# Patient Record
Sex: Female | Born: 1965 | Race: White | Hispanic: No | Marital: Single | Smoking: Current every day smoker
Health system: Southern US, Community
[De-identification: ages and names within clinical notes are randomized; demographics above are authoritative.]

## PROBLEM LIST (undated history)

## (undated) DIAGNOSIS — T7840XA Allergy, unspecified, initial encounter: Secondary | ICD-10-CM

## (undated) DIAGNOSIS — M810 Age-related osteoporosis without current pathological fracture: Secondary | ICD-10-CM

## (undated) DIAGNOSIS — D689 Coagulation defect, unspecified: Secondary | ICD-10-CM

## (undated) DIAGNOSIS — F419 Anxiety disorder, unspecified: Secondary | ICD-10-CM

## (undated) DIAGNOSIS — K219 Gastro-esophageal reflux disease without esophagitis: Secondary | ICD-10-CM

## (undated) DIAGNOSIS — F32A Depression, unspecified: Secondary | ICD-10-CM

## (undated) DIAGNOSIS — R519 Headache, unspecified: Secondary | ICD-10-CM

## (undated) DIAGNOSIS — M1990 Unspecified osteoarthritis, unspecified site: Secondary | ICD-10-CM

## (undated) DIAGNOSIS — E785 Hyperlipidemia, unspecified: Secondary | ICD-10-CM

## (undated) HISTORY — PX: TONSILLECTOMY: SUR1361

## (undated) HISTORY — DX: Age-related osteoporosis without current pathological fracture: M81.0

## (undated) HISTORY — PX: COLONOSCOPY: SHX174

## (undated) HISTORY — DX: Hyperlipidemia, unspecified: E78.5

## (undated) HISTORY — PX: ABDOMINAL HYSTERECTOMY: SHX81

## (undated) HISTORY — PX: OTHER SURGICAL HISTORY: SHX169

## (undated) HISTORY — DX: Allergy, unspecified, initial encounter: T78.40XA

## (undated) HISTORY — PX: FRACTURE SURGERY: SHX138

## (undated) HISTORY — PX: APPENDECTOMY: SHX54

## (undated) HISTORY — DX: Coagulation defect, unspecified: D68.9

## (undated) HISTORY — PX: BOWEL RESECTION: SHX1257

---

## 2003-07-22 HISTORY — PX: BOWEL RESECTION: SHX1257

## 2003-07-22 HISTORY — PX: APPENDECTOMY: SHX54

## 2003-07-22 HISTORY — PX: OTHER SURGICAL HISTORY: SHX169

## 2003-11-26 ENCOUNTER — Emergency Department (HOSPITAL_COMMUNITY): Admission: EM | Admit: 2003-11-26 | Discharge: 2003-11-26 | Payer: Self-pay | Admitting: Emergency Medicine

## 2003-11-26 IMAGING — CT CT PELVIS W/ CM
1 of 3 series · 13 of 32 positions shown, 19 images · IV contrast (APPLIED)
Comparison: none

CLINICAL DATA: Right-sided abdominal pain.  History of endometriosis.
TECHNIQUE: Multidetector helical CT of the abdomen and pelvis is performed during the administration of 150 cc of Omnipaque 300 intravenous contrast.  Oral contrast has also been administered.  There are no prior studies for comparison.
 ABDOMEN CT WITH CONTRAST 
 The liver, gallbladder, spleen, pancreas, adrenal glands, and kidneys are normal in appearance.  There is no evidence of dilated bowel loops.  There is no evidence of abnormal soft tissue masses or adenopathy.  There is no evidence of inflammatory process or ascites. 
 IMPRESSION
 Negative abdomen CT. 
 PELVIS CT WITH CONTRAST 
 A mixed attenuation mass is seen within the right pelvis involving right lower quadrant small bowel loops.  This mass measures approximately 6.0 x 5.2 cm.  This mass involves the serosal surface of several of these small bowel loops, and this would be consistent with an endometrioma involving small bowel.  Neoplasm could have a similar appearance and cannot be excluded.  There is no evidence of bowel obstruction.  There is no evidence of other pelvic masses or free fluid.  
 1.  6 cm mass in the right pelvis involving small bowel loops.  This appears to be involving the serosal surface of several of these small bowel loops and would be consistent with an endometrioma given the patient?s history of endometriosis.  Neoplasm could have a similar appearance and cannot definitely be excluded. 
 2.  No evidence of small bowel obstruction or free fluid.

[Series 2: abd/pelvis 5.0 b30f · axial · 0.65mm/px · z∈[-426,-81]mm · 13 of 81 slices shown, 19 images]
[im 6/81  soft-tissue]
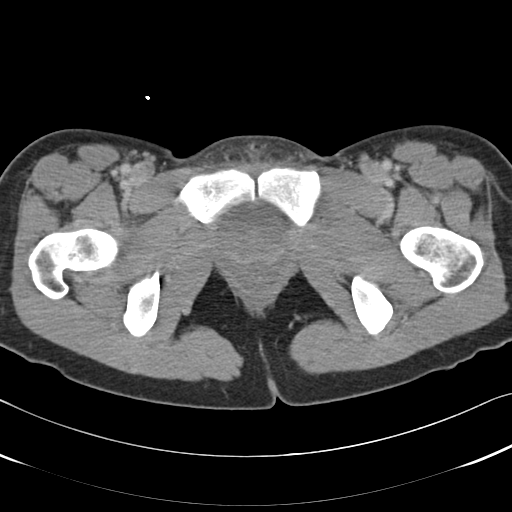
[im 6/81  bone]
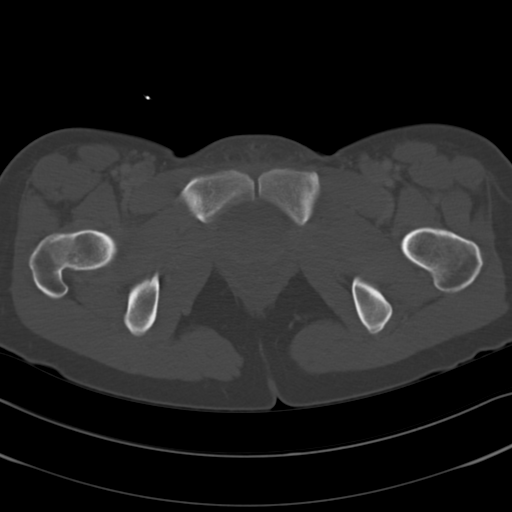
[im 11/81  soft-tissue]
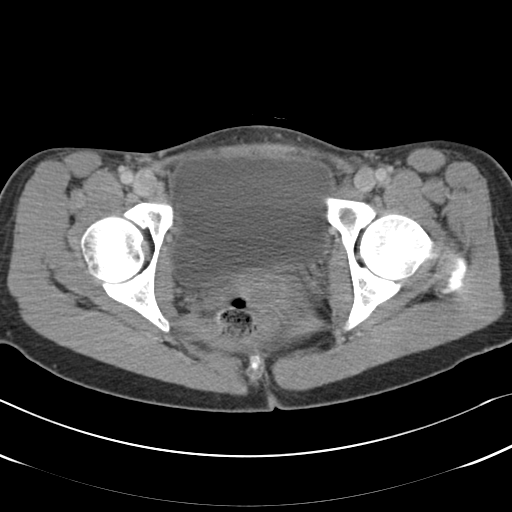
[im 17/81  soft-tissue]
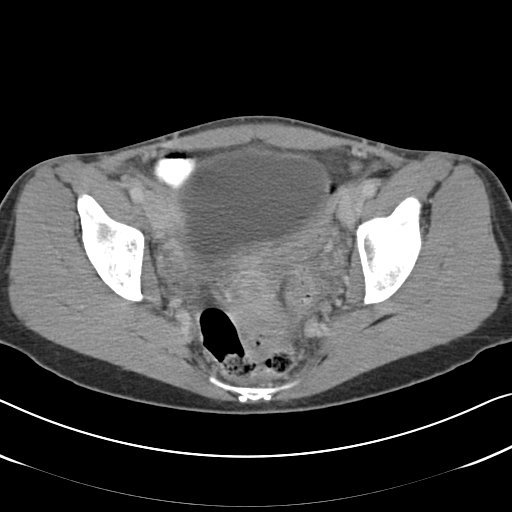
[im 22/81  soft-tissue]
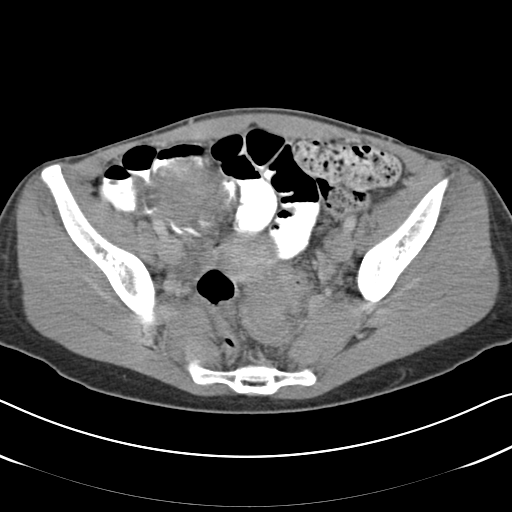
[im 27/81  soft-tissue]
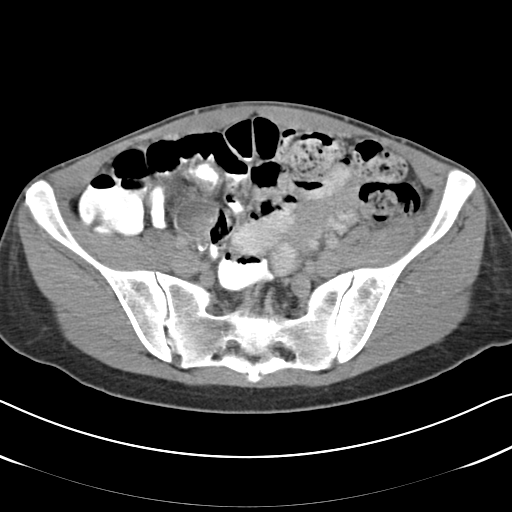
[im 33/81  soft-tissue]
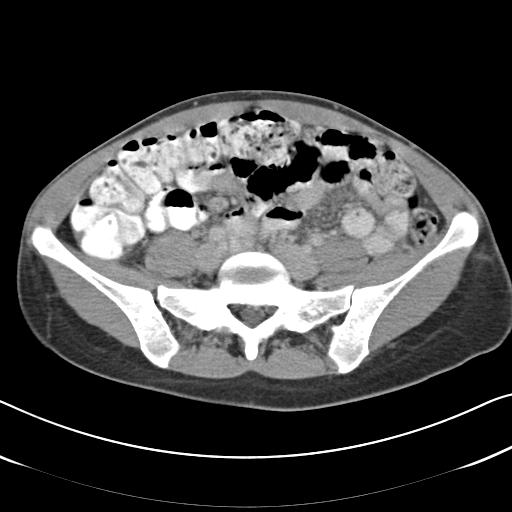
[im 43/81  soft-tissue]
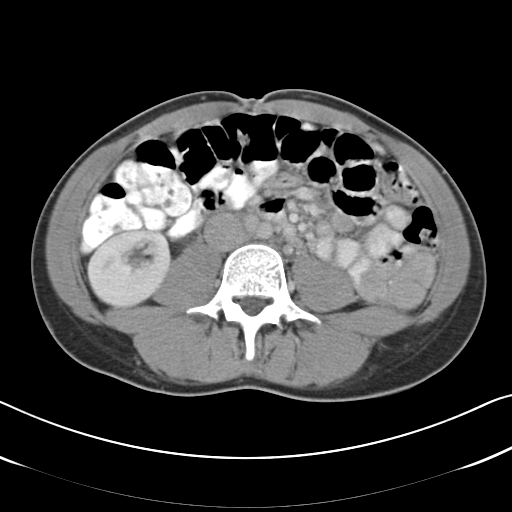
[im 49/81  soft-tissue]
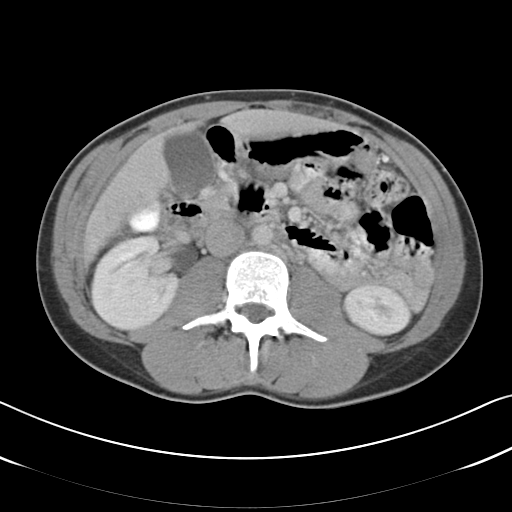
[im 54/81  soft-tissue]
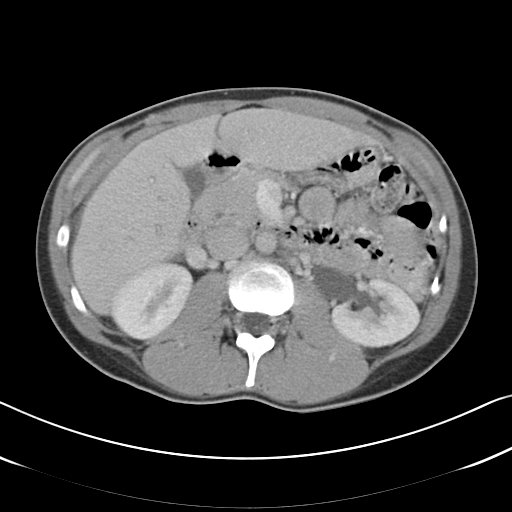
[im 54/81  bone]
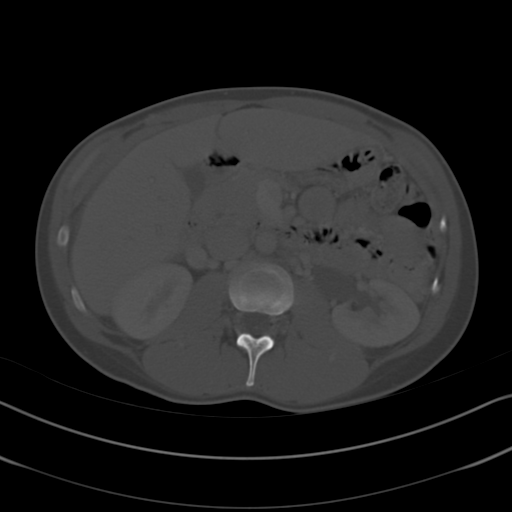
[im 59/81  soft-tissue]
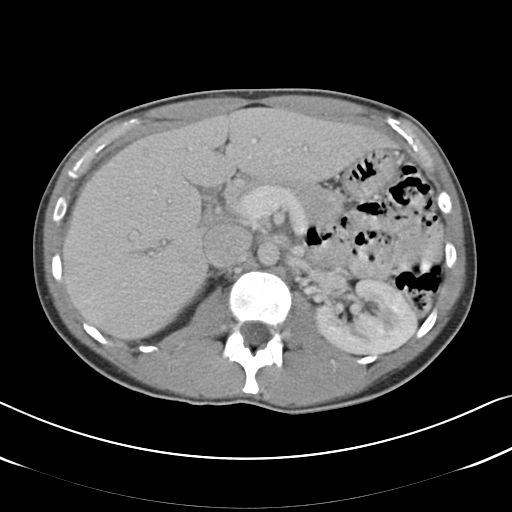
[im 59/81  lung]
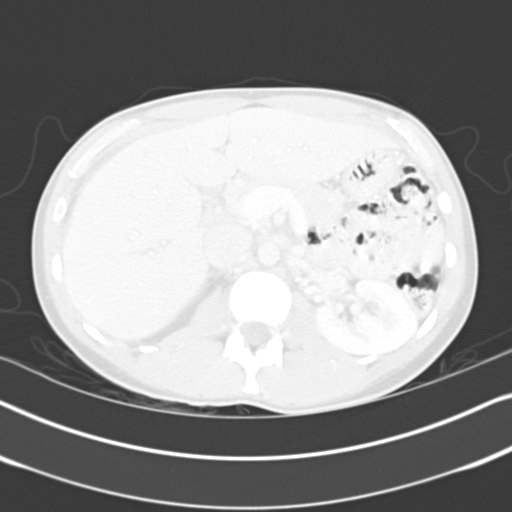
[im 65/81  soft-tissue]
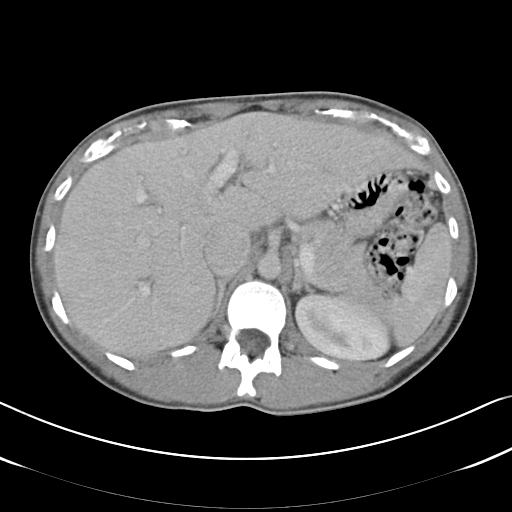
[im 65/81  lung]
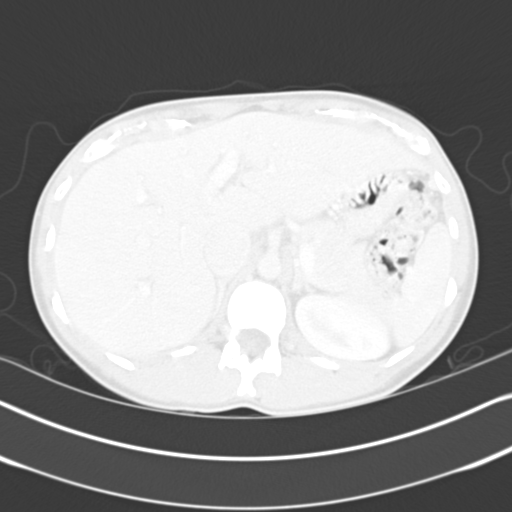
[im 70/81  soft-tissue]
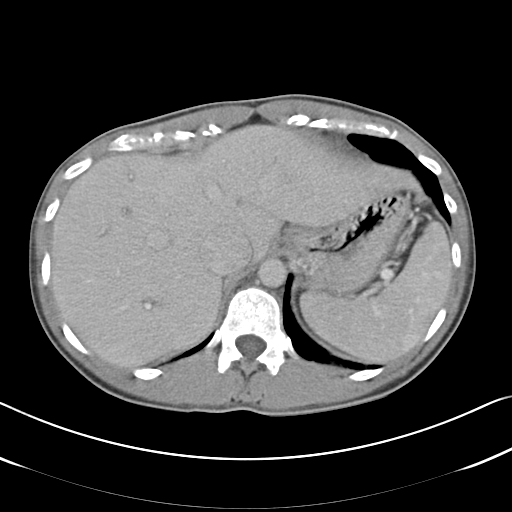
[im 70/81  lung]
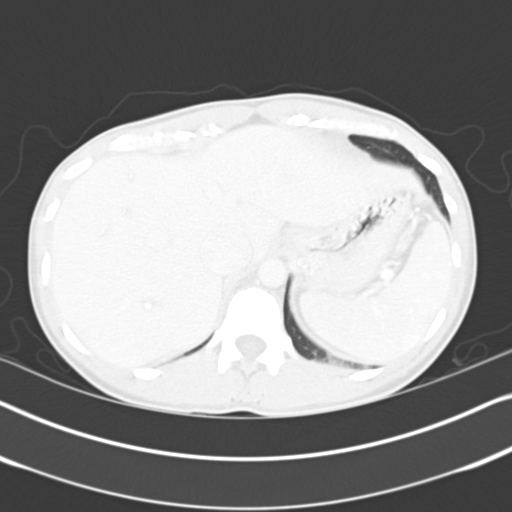
[im 75/81  soft-tissue]
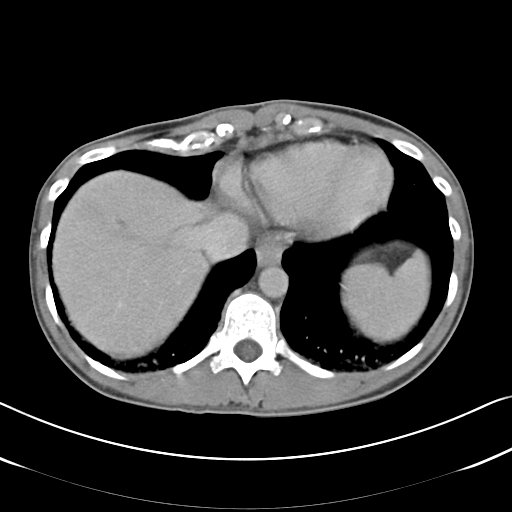
[im 75/81  lung]
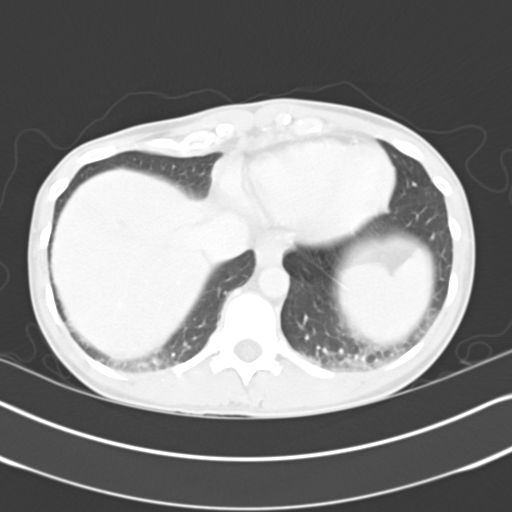

[13 of 32 positions shown; findings below may reference images not displayed]

## 2003-11-26 IMAGING — US US PELVIS COMPLETE MODIFY
1 series · 13 of 25 positions shown · non-contrast
Comparison: none

CLINICAL DATA: Right-sided abdominal and pelvic pain.  History of endometriosis.  Evaluate for pelvic mass or ovarian torsion. 
 TRANSABDOMINAL AND TRANSVAGINAL PELVIC ULTRASOUND 
 Both transabdominal and transvaginal sonography of the pelvis was performed. 
 Transabdominal sonography shows a heterogeneous soft tissue mass in the right pelvis superior to the level of the uterus which measures approximately 5.5 x 4 x 4 cm in greatest dimensions.  This is located relatively high within the right pelvis and could not be visualized by transvaginal sonography.  The uterus is normal in size measuring approximately 8.2 cm in length on transabdominal sonography.  No other pelvic masses are seen.  
 Transvaginal sonography shows a normal appearance of the uterus.  There is no evidence of fibroids.  Normal appearance of the endometrium is seen which measures approximately 3 mm.  
 Both ovaries are visualized and are normal in appearance.  On transvaginal sonography, both ovaries are seen distinct from the soft tissue mass in the high right pelvis.  Both ovaries contain multiple tiny follicles.  There is no evidence of free fluid.  
 IMPRESSION
 1.  5.5 cm soft tissue mass in the right pelvis superior to the uterus, which is separate from the ovaries.  
 2.  Normal appearance of both ovaries.  No evidence of free fluid. 
 3.  Normal size and appearance of the uterus. 
 PELVIS DOPPLER ULTRASOUND 
 Color Doppler and duplex Doppler sonography was performed in the pelvis to evaluate for ovarian torsion.  
 Color Doppler ultrasound shows the presence of blood flow within both ovaries on transvaginal sonography.  Duplex Doppler shows the presence of both arterial and venous waveforms within both the right and left ovaries. 
 Arterial and venous Doppler waveform patterns were seen in both ovaries.  No evidence of ovarian torsion.

[Series 1: pelvis · 0.30mm/px · 13 of 46 slices shown]
[im 1/46]
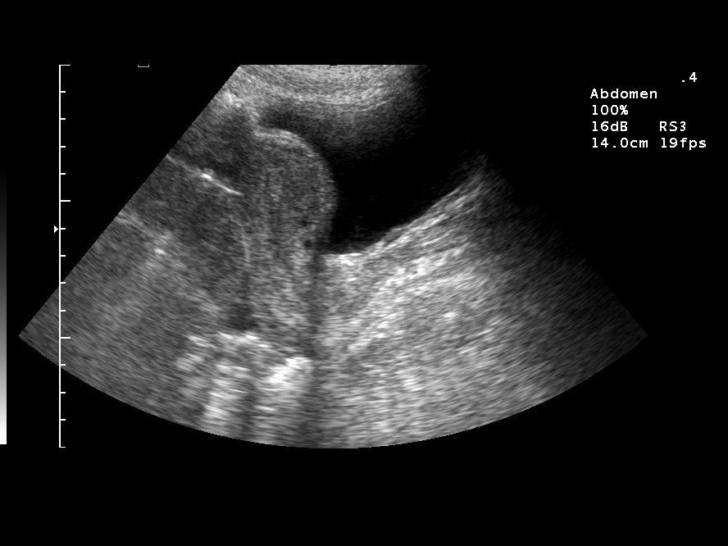
[im 4/46]
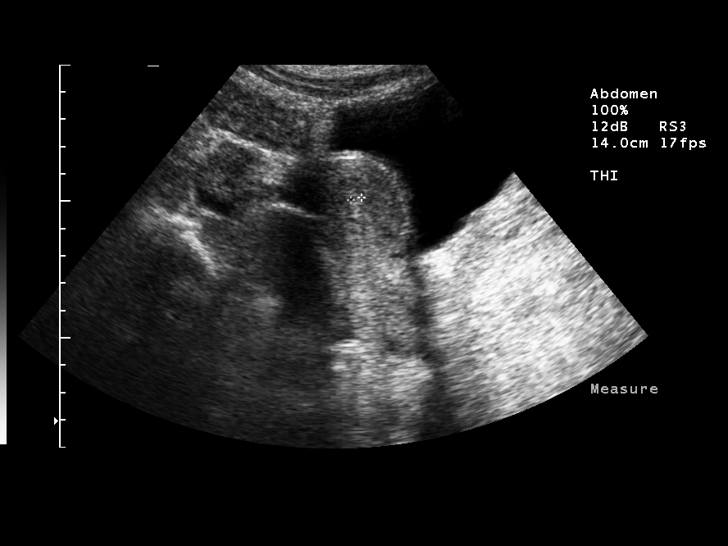
[im 8/46]
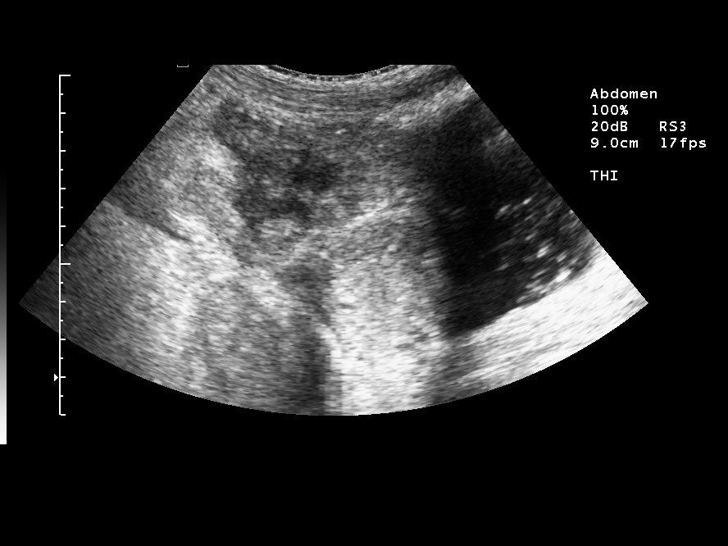
[im 12/46]
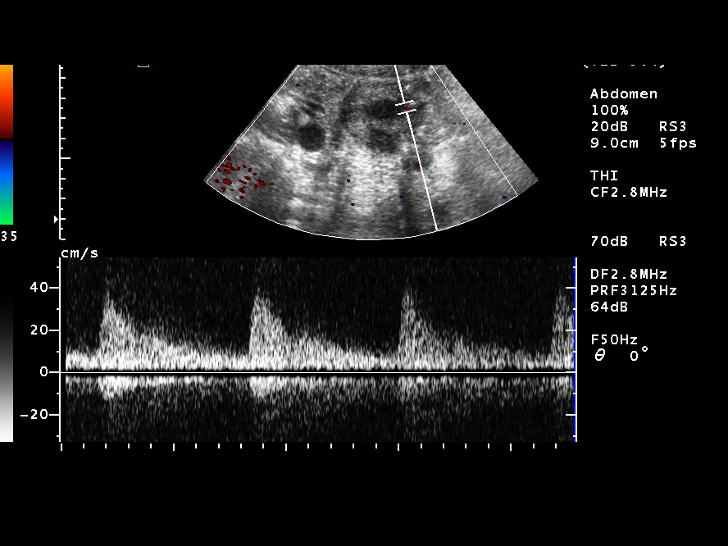
[im 16/46]
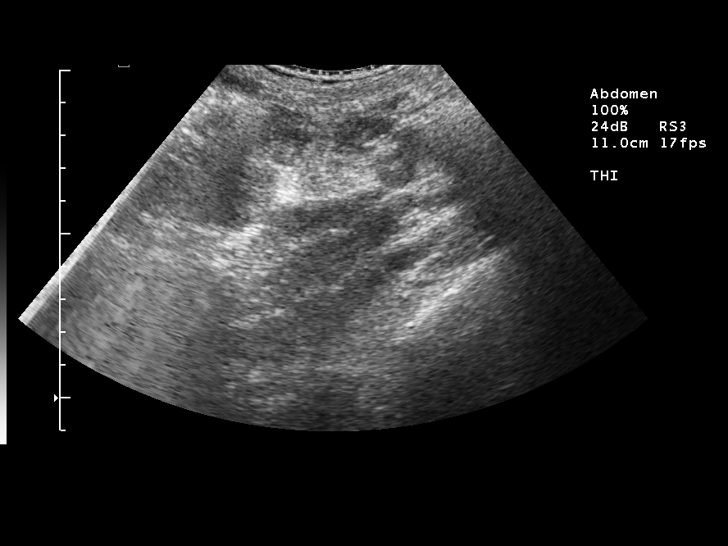
[im 19/46]
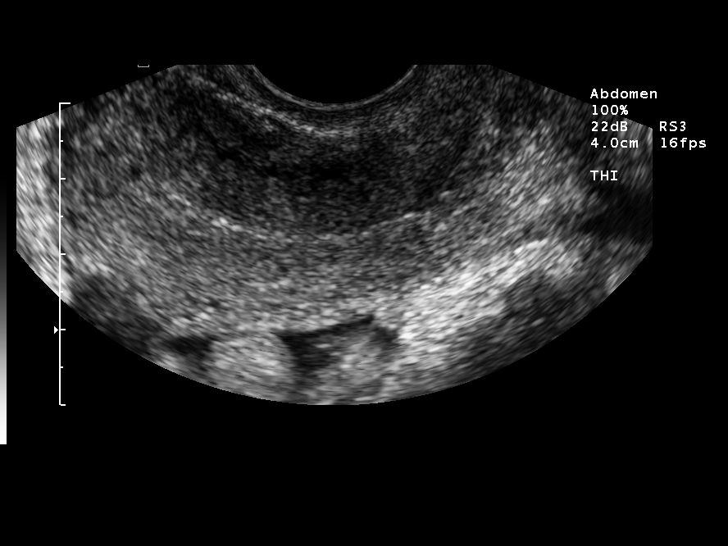
[im 23/46]
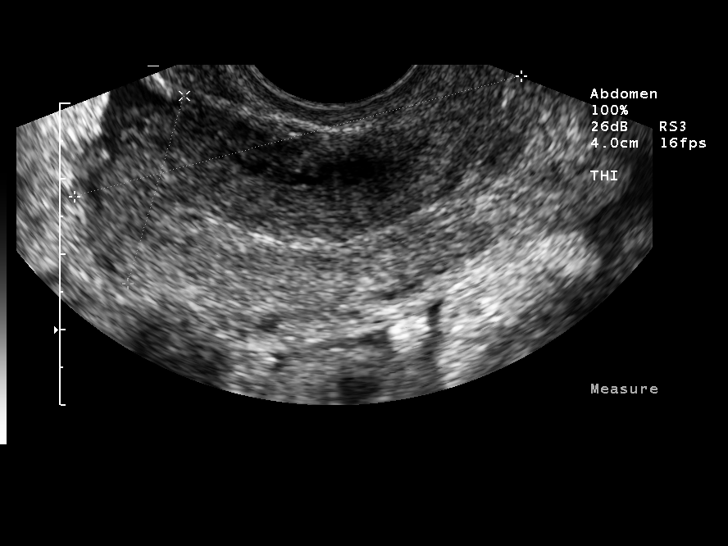
[im 27/46]
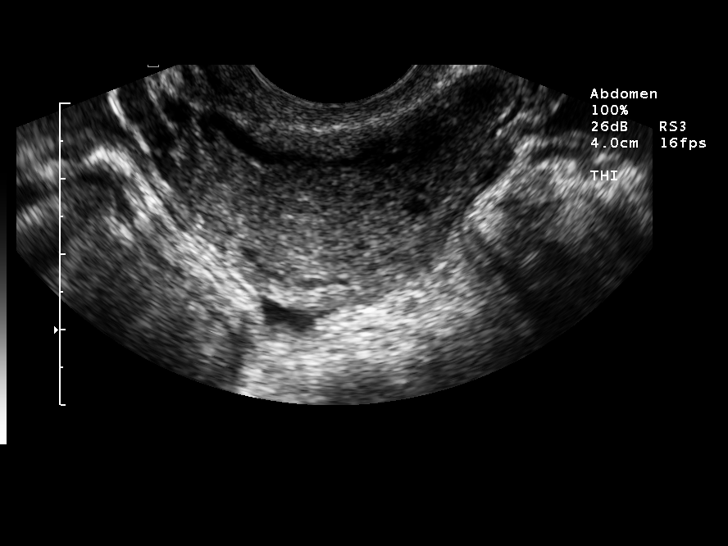
[im 31/46]
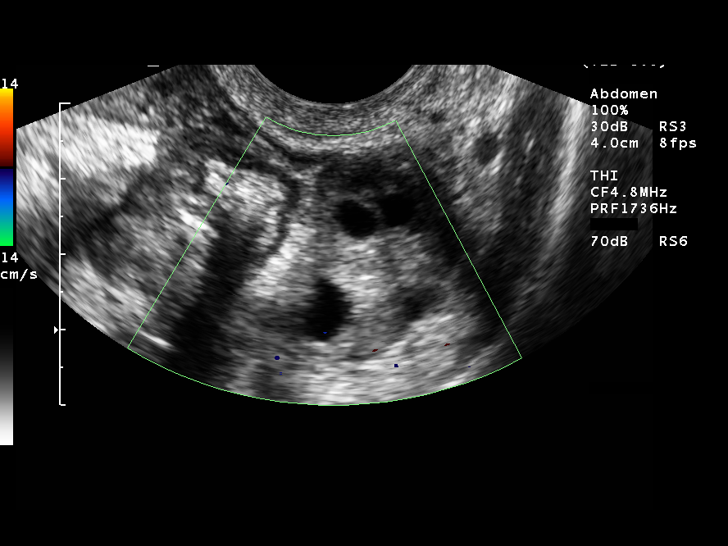
[im 34/46]
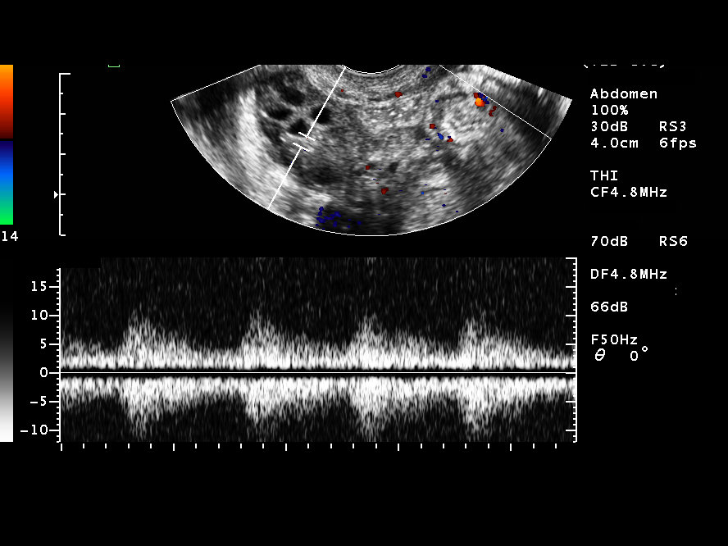
[im 38/46]
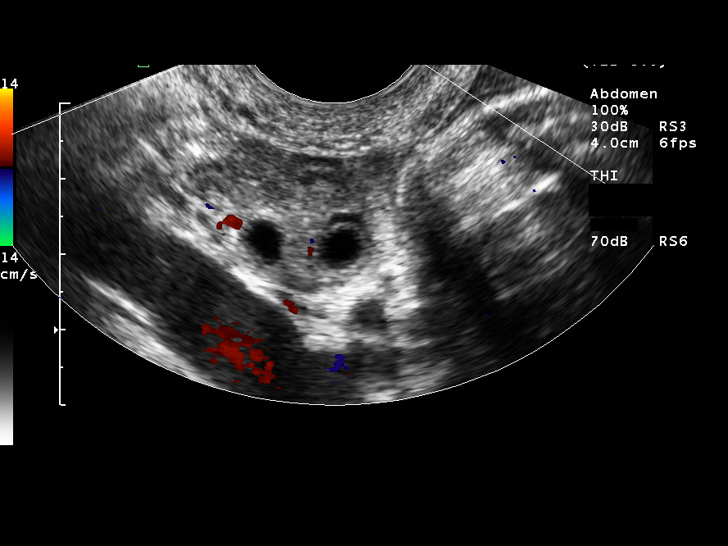
[im 42/46]
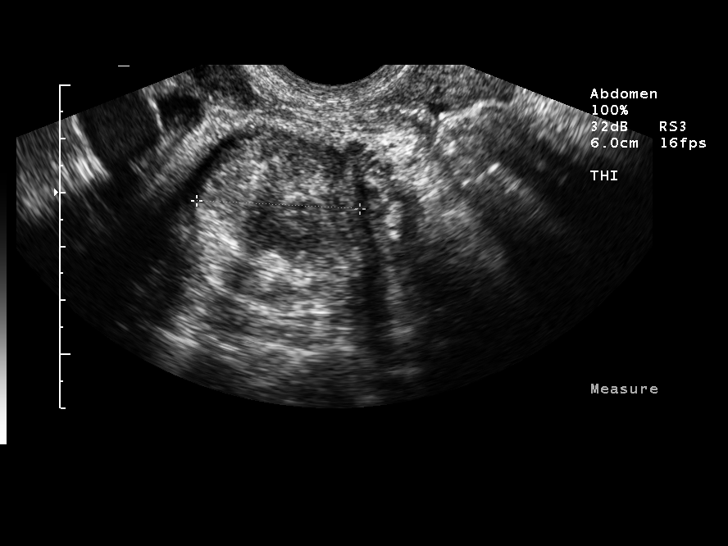
[im 46/46]
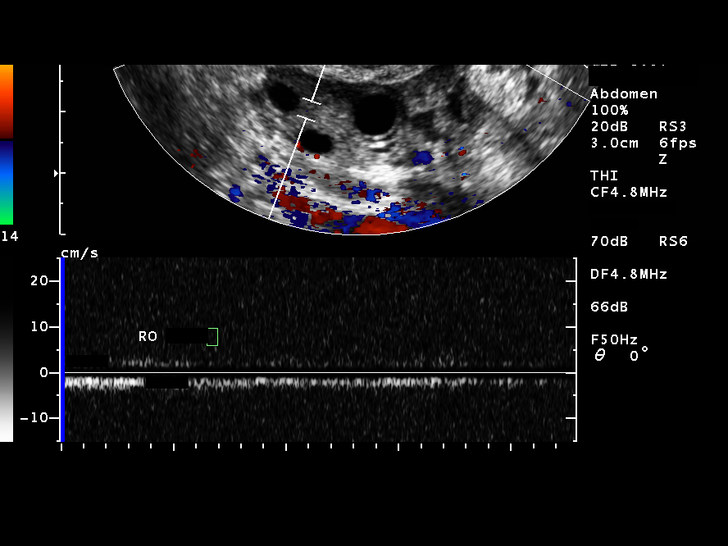

[13 of 25 positions shown; findings below may reference images not displayed]

## 2004-04-08 ENCOUNTER — Encounter (INDEPENDENT_AMBULATORY_CARE_PROVIDER_SITE_OTHER): Payer: Self-pay | Admitting: *Deleted

## 2004-04-09 ENCOUNTER — Inpatient Hospital Stay (HOSPITAL_COMMUNITY): Admission: EM | Admit: 2004-04-09 | Discharge: 2004-04-15 | Payer: Self-pay | Admitting: Emergency Medicine

## 2004-04-09 IMAGING — CR DG ABDOMEN ACUTE W/ 1V CHEST
4 series · 4 of 4 positions shown · non-contrast
Comparison: none

CLINICAL DATA: Abdominal pain and nausea.  
 ABDOMEN SERIES WITH SINGLE VIEW CHEST
 Supine and left decubitus abdominal radiographs show scattered small bowel and colonic gas.  There is no evidence of dilated bowel loops or air fluid levels.  There is no evidence of free intraperitoneal air.  A small amount of contrast material or other radiopaque substance is noted within the stomach.  No radiopaque calculi are seen.  Several pelvic phleboliths are noted.
 The heart size and mediastinal contours are normal.  Both lungs are clear.  Several bilateral small calcified granulomata are noted in both lungs.
 IMPRESSION
 1.  Unremarkable bowel gas pattern.  
 2.  No active cardiopulmonary disease.

[view not recorded (1 of 4)]
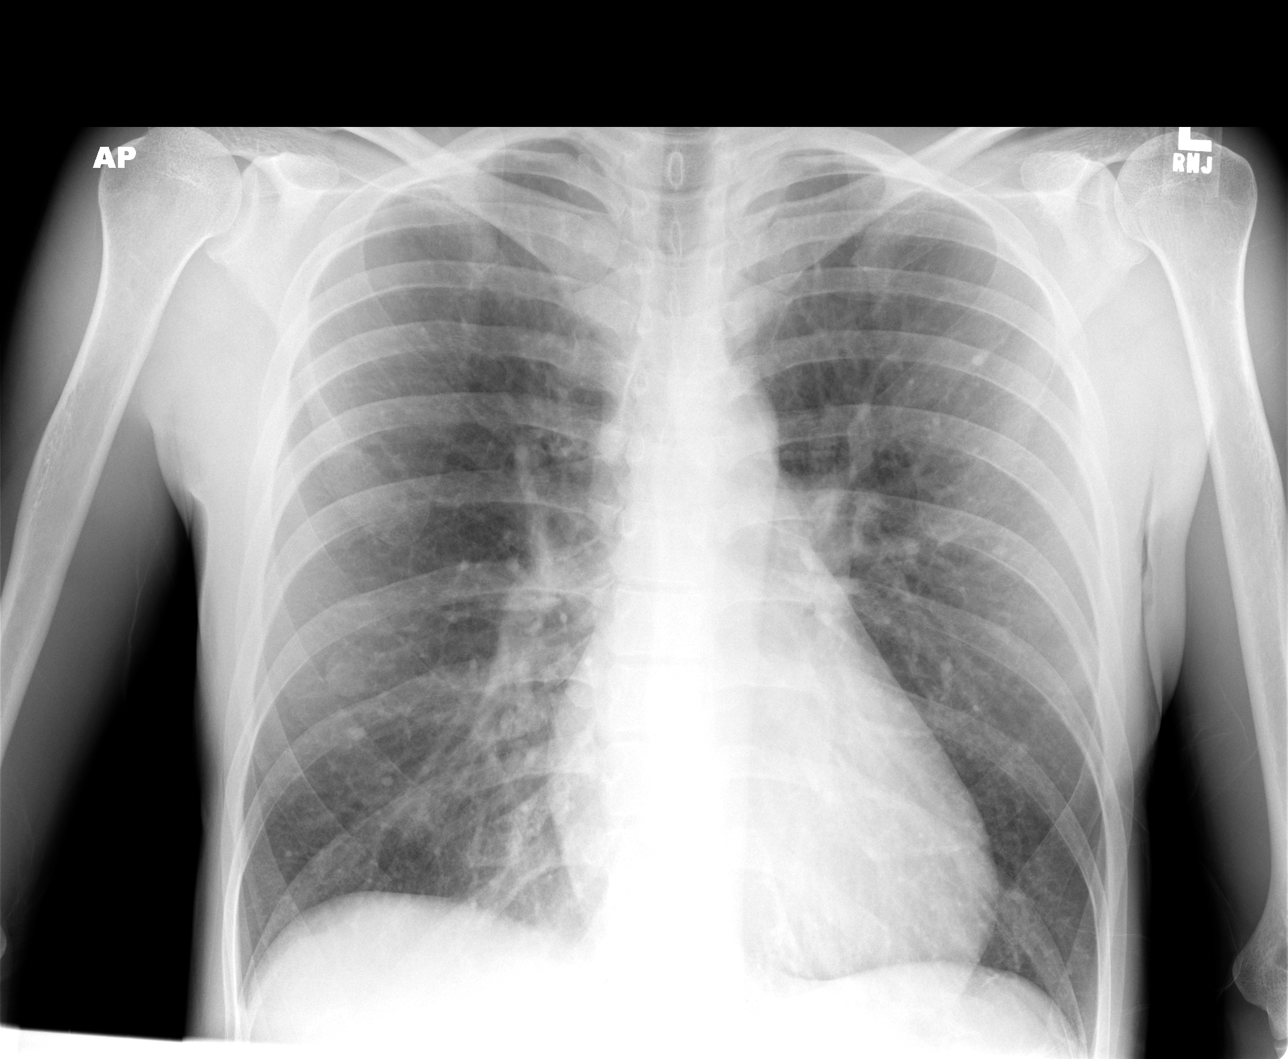

[view not recorded (2 of 4)]
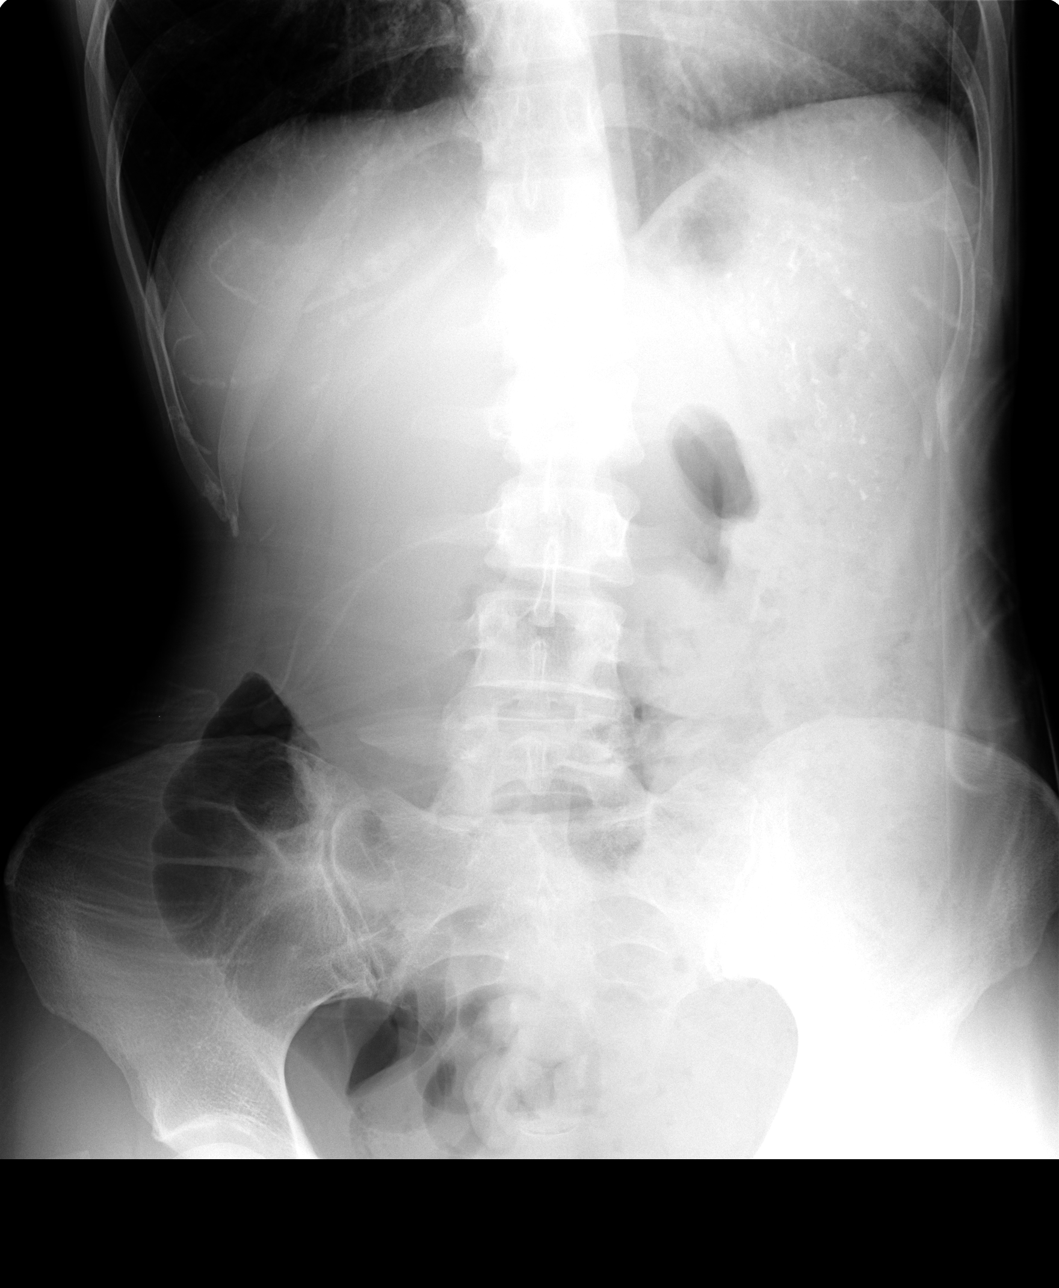

[view not recorded (3 of 4)]
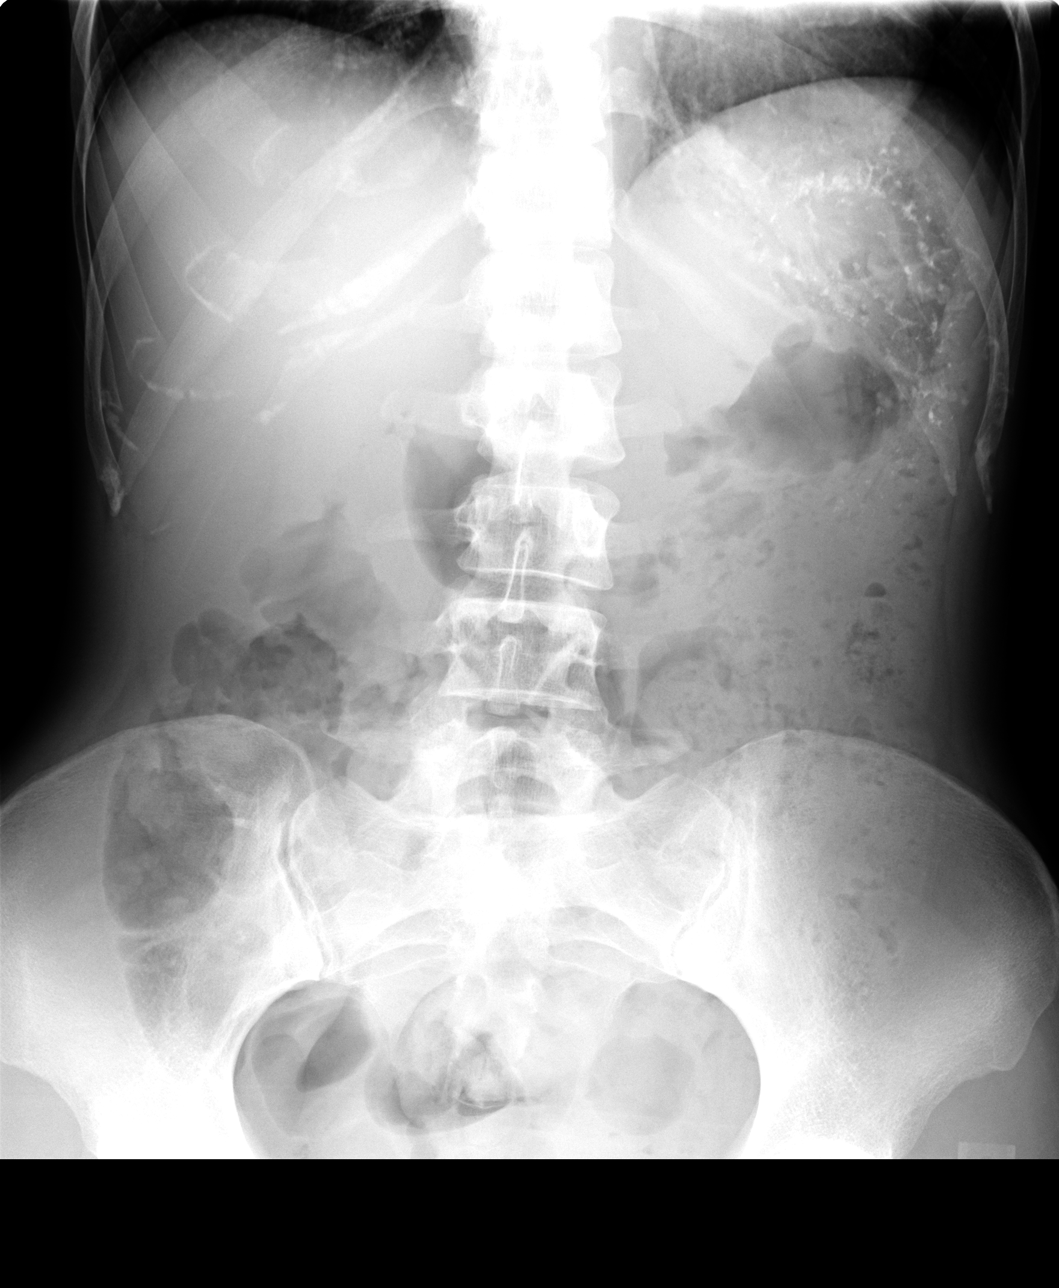

[view not recorded (4 of 4)]
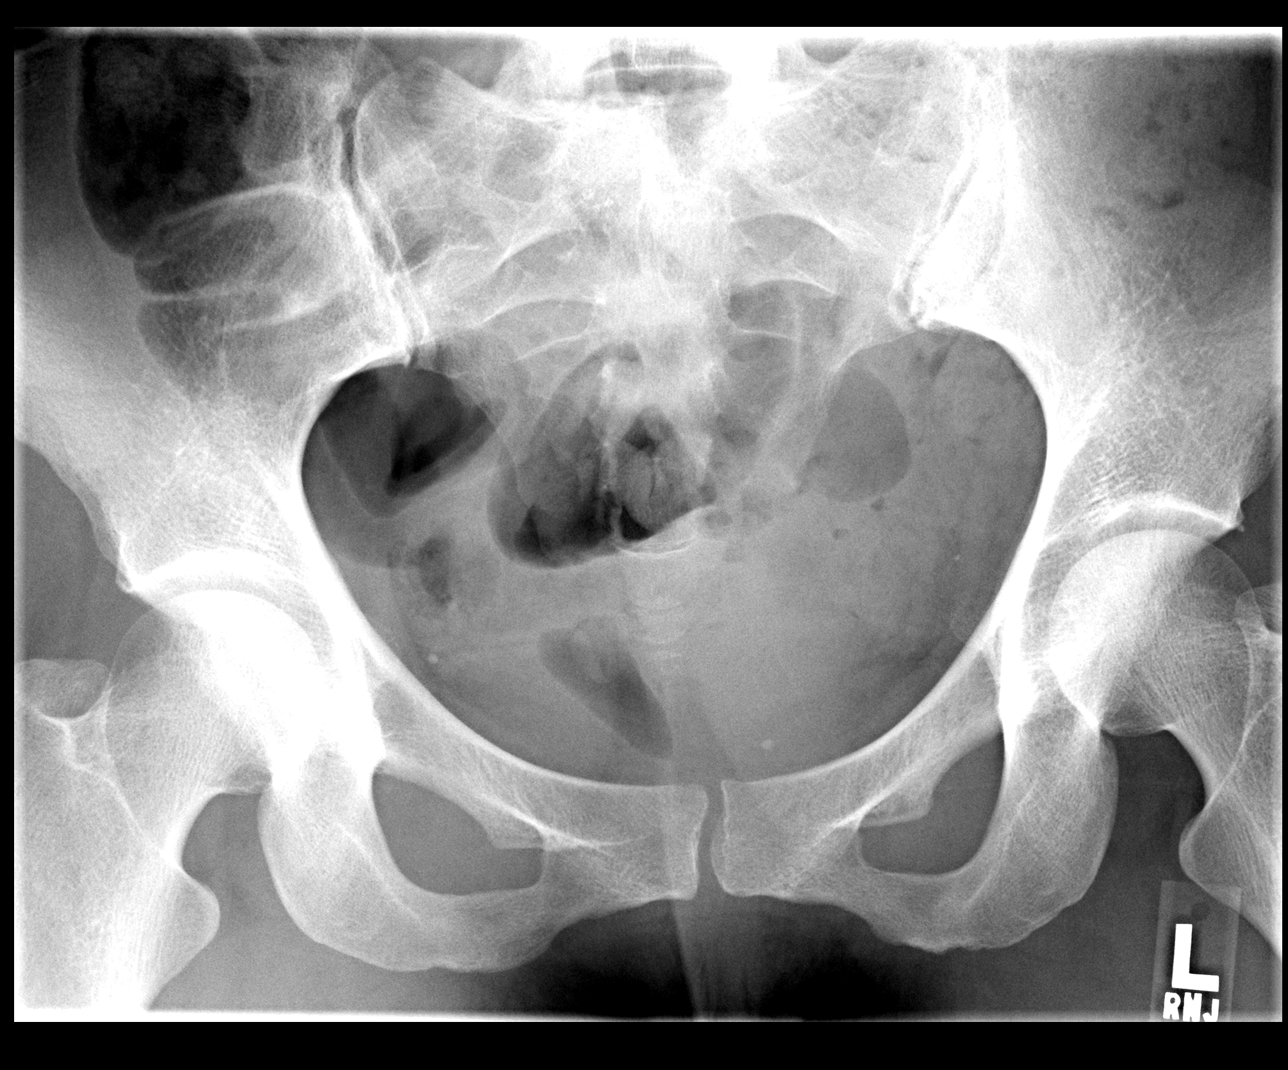

[4 of 4 positions shown; findings below may reference images not displayed]

## 2004-04-09 IMAGING — CT CT ABDOMEN W/ CM
2 of 5 series · 13 of 35 positions shown, 19 images · IV contrast (omnipaque)
Comparison: none

CLINICAL DATA: Acute onset generalized abdominal pain.  Elevated white count.  Endometriosis.  Previous surgery for bowel resection.
TECHNIQUE: Multidetector CT imaging of the abdomen and pelvis was performed during administration of 100 cc Omnipaque 300 intravenous contrast. Oral contrast was also administered.  Comparison is made to a prior study on [DATE]. 
ABDOMEN CT WITH CONTRAST
A small amount of ascites is seen in the right abdomen.  There is no evidence of abdominal mass or inflammatory process.  No dilated bowel loops are seen within the abdomen.  The abdominal parenchymal organs are unremarkable in appearance.  
IMPRESSION
Small amount of free fluid noted in the right abdomen. See pelvis CT report below. 
PELVIS CT WITH CONTRAST
Dilated fluid-filled small bowel loops are seen in the right pelvis which demonstrate wall thickening.  There is a central swirled appearance of the small bowel mesentery in this region in the area of surgical staples, and this is suspicious for a closed loop obstruction. 
A simple cyst is seen in the left adnexa measuring 2.8 x 2.2 cm which is consistent with a small left ovarian cyst.  A small soft tissue mass is seen projecting from the right frontal region of the uterus which measures 1.5 cm and is consistent with a small fibroid. 
Findings suspicious of closed loop small bowel obstruction in the right pelvis in the area of surgical staples.  Small amount of free fluid also noted.  
Small right fundal fibroid measuring approximately 1.5 cm.  2.8 cm left ovarian cyst also noted.

[Series 2: — · coronal · 1.00mm/px · 1 of 3 slices shown, 2 images (1 of 2)]
[im 2/3  soft-tissue]
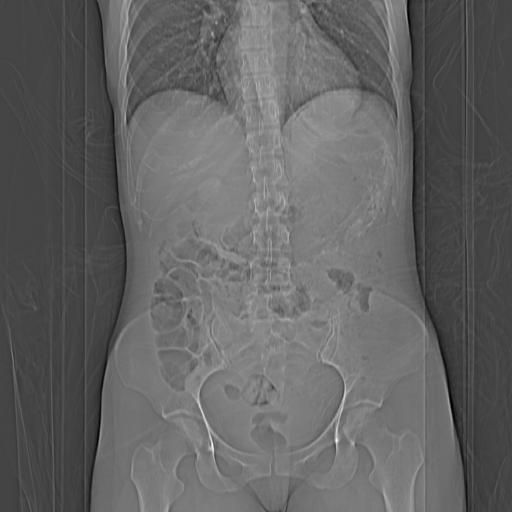
[im 2/3  bone]
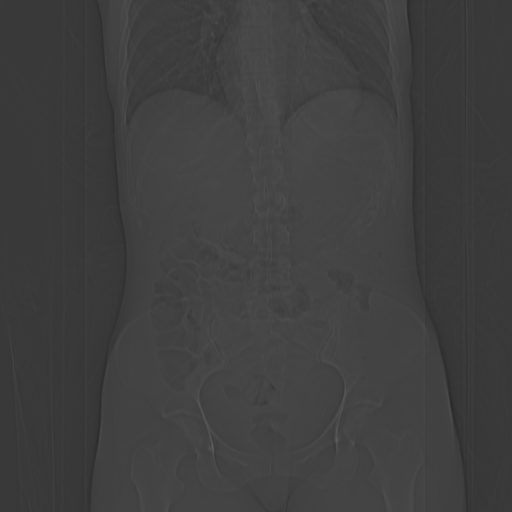

[Series 3: — · axial · 0.65mm/px · z∈[+734,+1109]mm · 12 of 85 slices shown, 17 images (2 of 2)]
[im 5/85  soft-tissue]
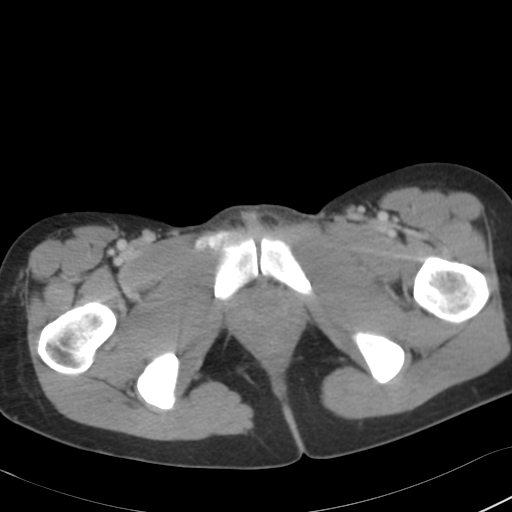
[im 5/85  bone]
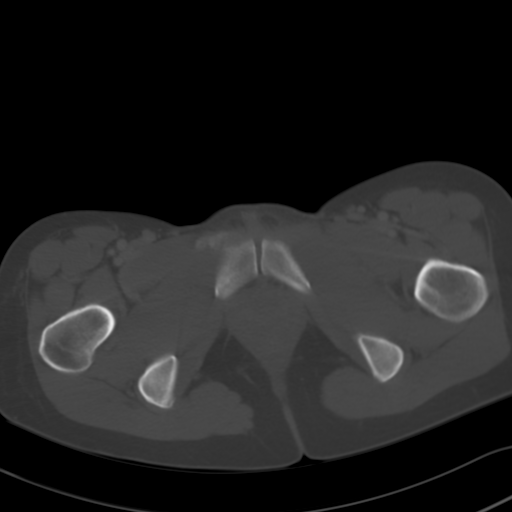
[im 15/85  soft-tissue]
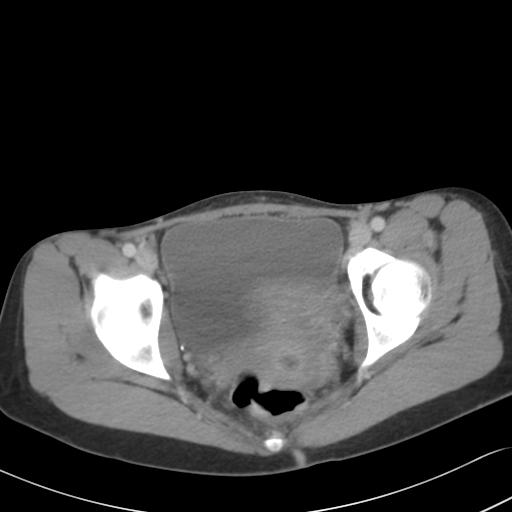
[im 19/85  soft-tissue]
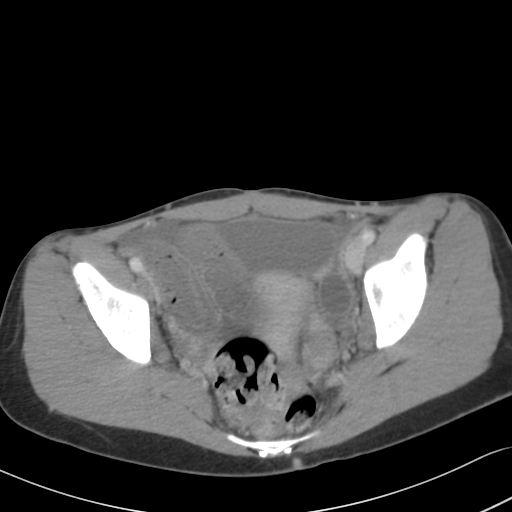
[im 29/85  soft-tissue]
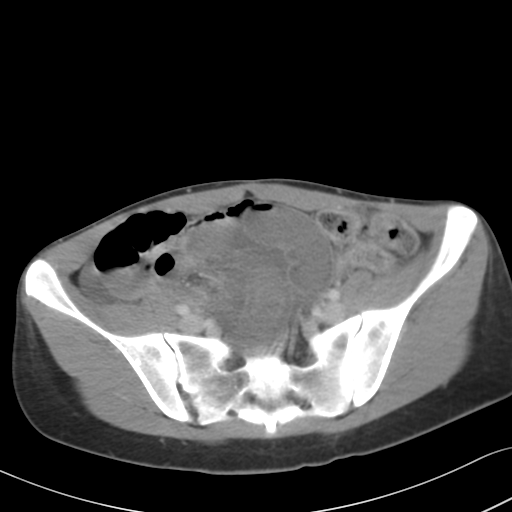
[im 33/85  soft-tissue]
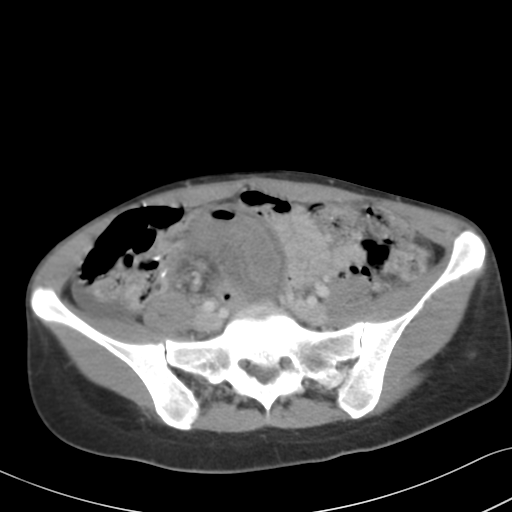
[im 43/85  soft-tissue]
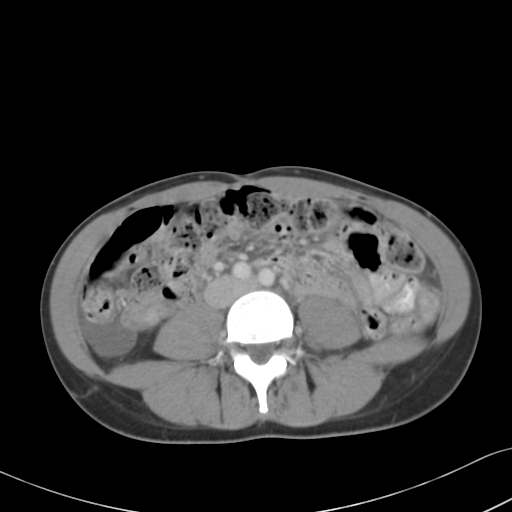
[im 52/85  soft-tissue]
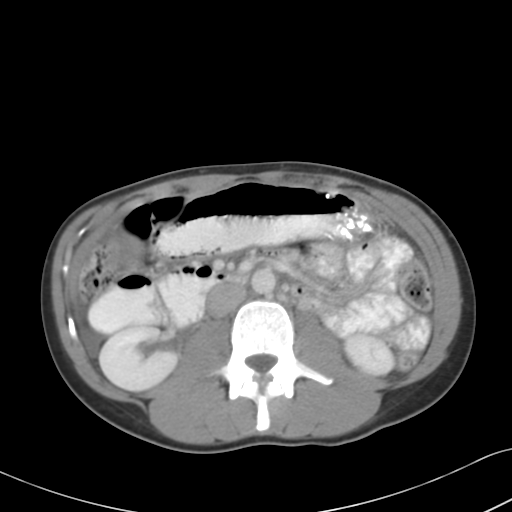
[im 57/85  soft-tissue]
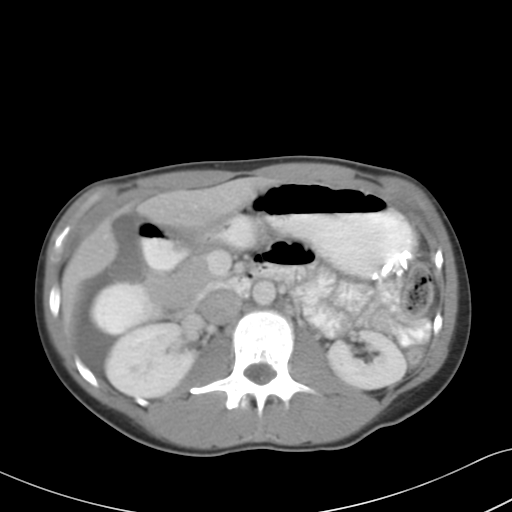
[im 66/85  soft-tissue]
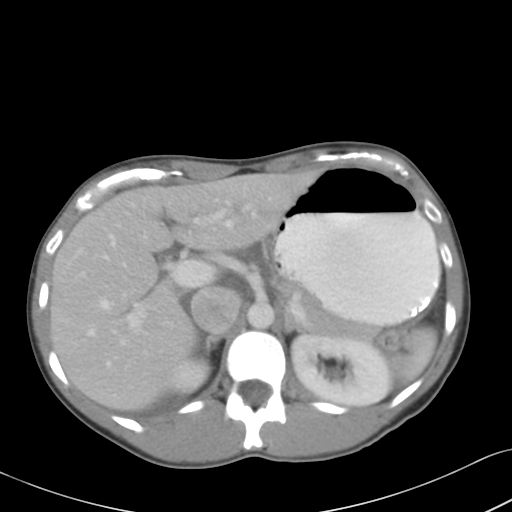
[im 66/85  lung]
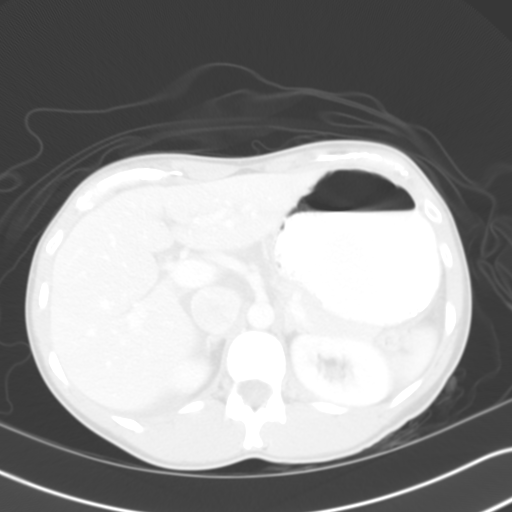
[im 66/85  bone]
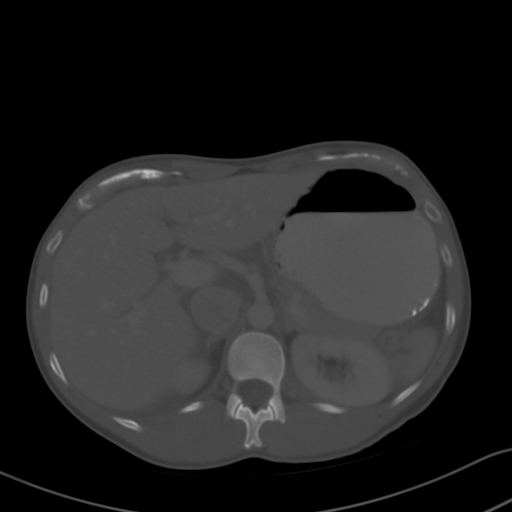
[im 71/85  soft-tissue]
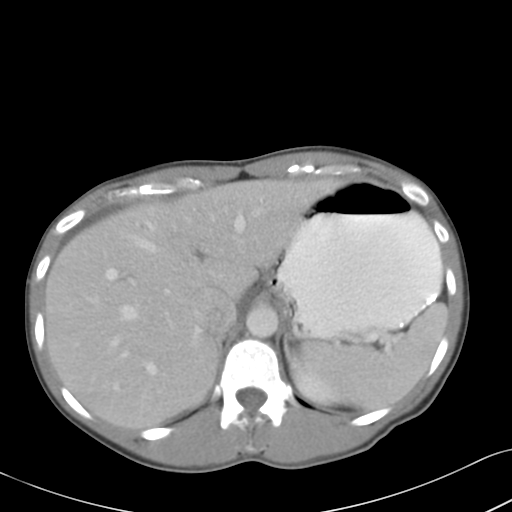
[im 71/85  lung]
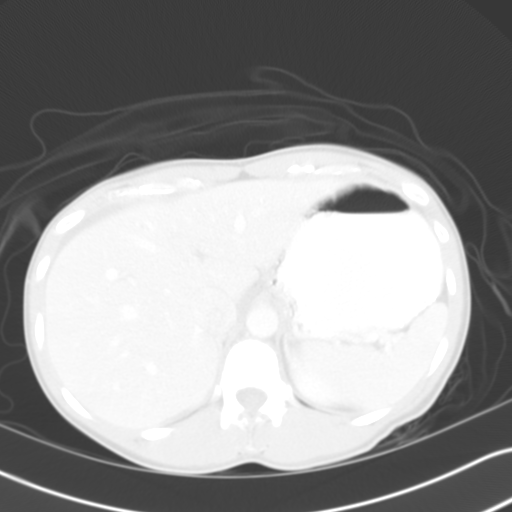
[im 75/85  lung]
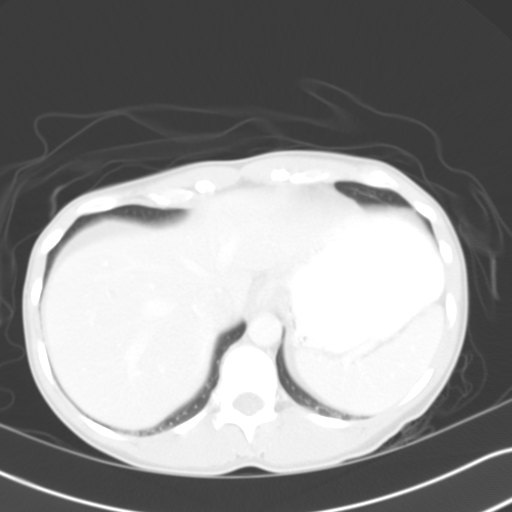
[im 80/85  soft-tissue]
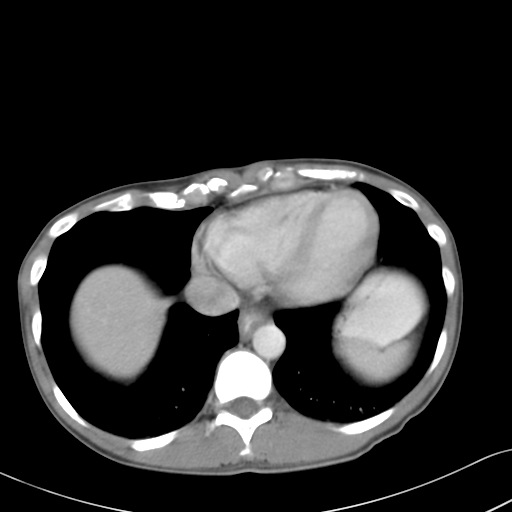
[im 80/85  lung]
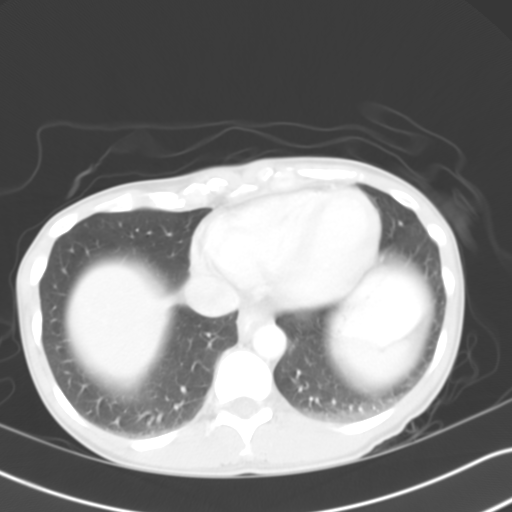

[13 of 35 positions shown; findings below may reference images not displayed]

## 2004-06-07 ENCOUNTER — Inpatient Hospital Stay (HOSPITAL_COMMUNITY): Admission: EM | Admit: 2004-06-07 | Discharge: 2004-06-21 | Payer: Self-pay | Admitting: Emergency Medicine

## 2004-06-07 IMAGING — CR DG ABDOMEN ACUTE W/ 1V CHEST
3 series · 3 of 3 positions shown · non-contrast
Comparison: none

CLINICAL DATA: 38-year-old with nausea, vomiting, abdominal pain.  
 ACUTE ABDOMINAL SERIES:
 A single upright view of the chest demonstrates cardiac silhouette, mediastinal and hilar contours to be within normal limits.  Lungs are clear.  There are multiple scattered calcified granulomata in the lungs.  
 Two views of the abdomen demonstrate scattered air in the colon down to the rectum.  There are a few small bowel loops with air in them but I do not see any significant dilatation.  There are a few air-fluid levels on the decubitus film which are probably in the colon.  No free air.

[view not recorded (1 of 3)]
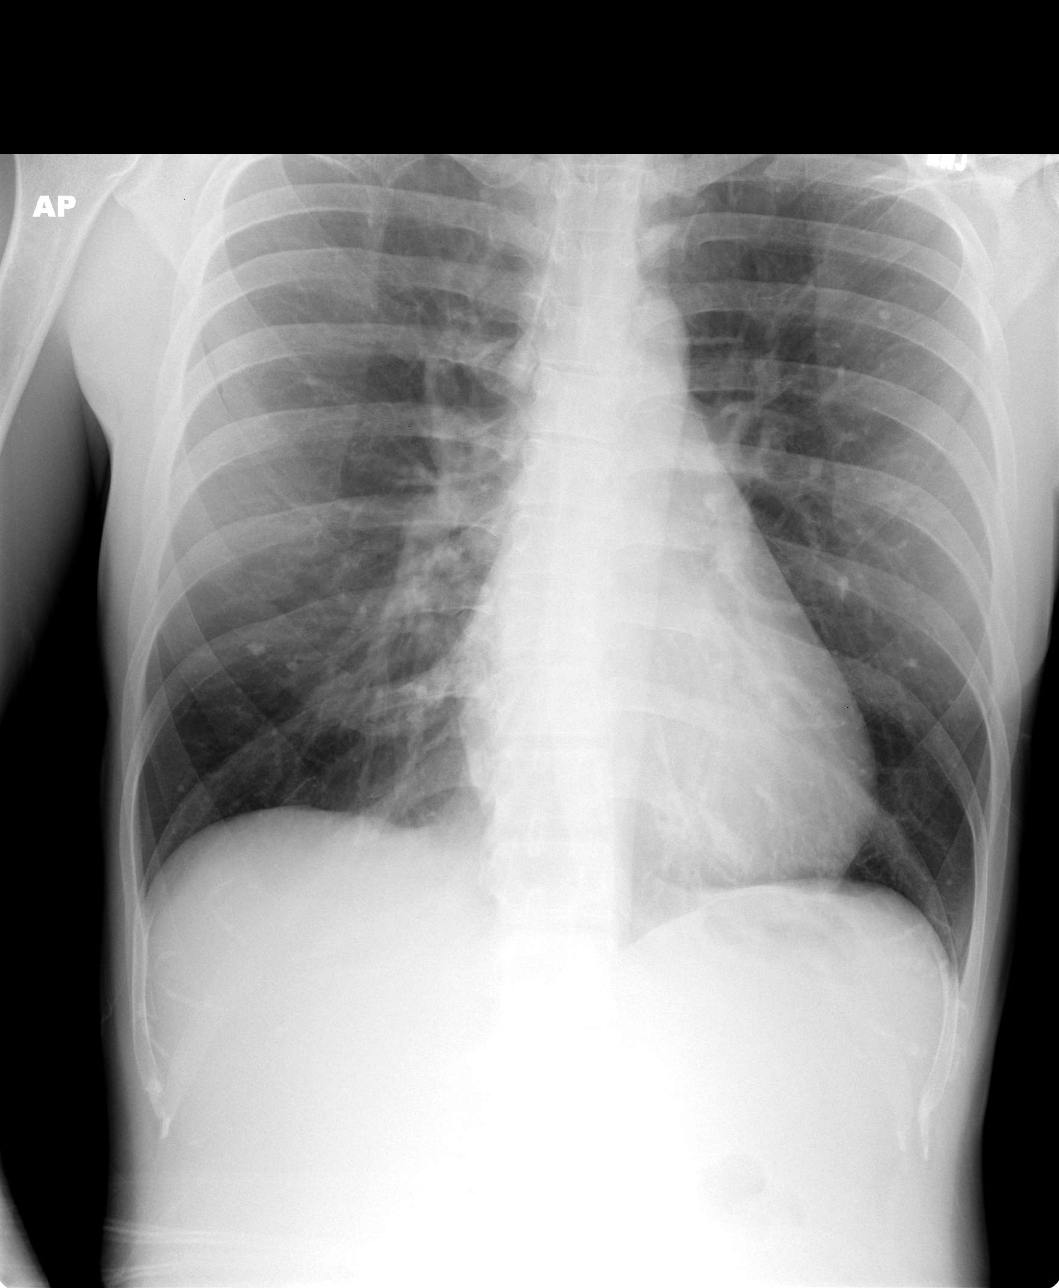

[view not recorded (2 of 3)]
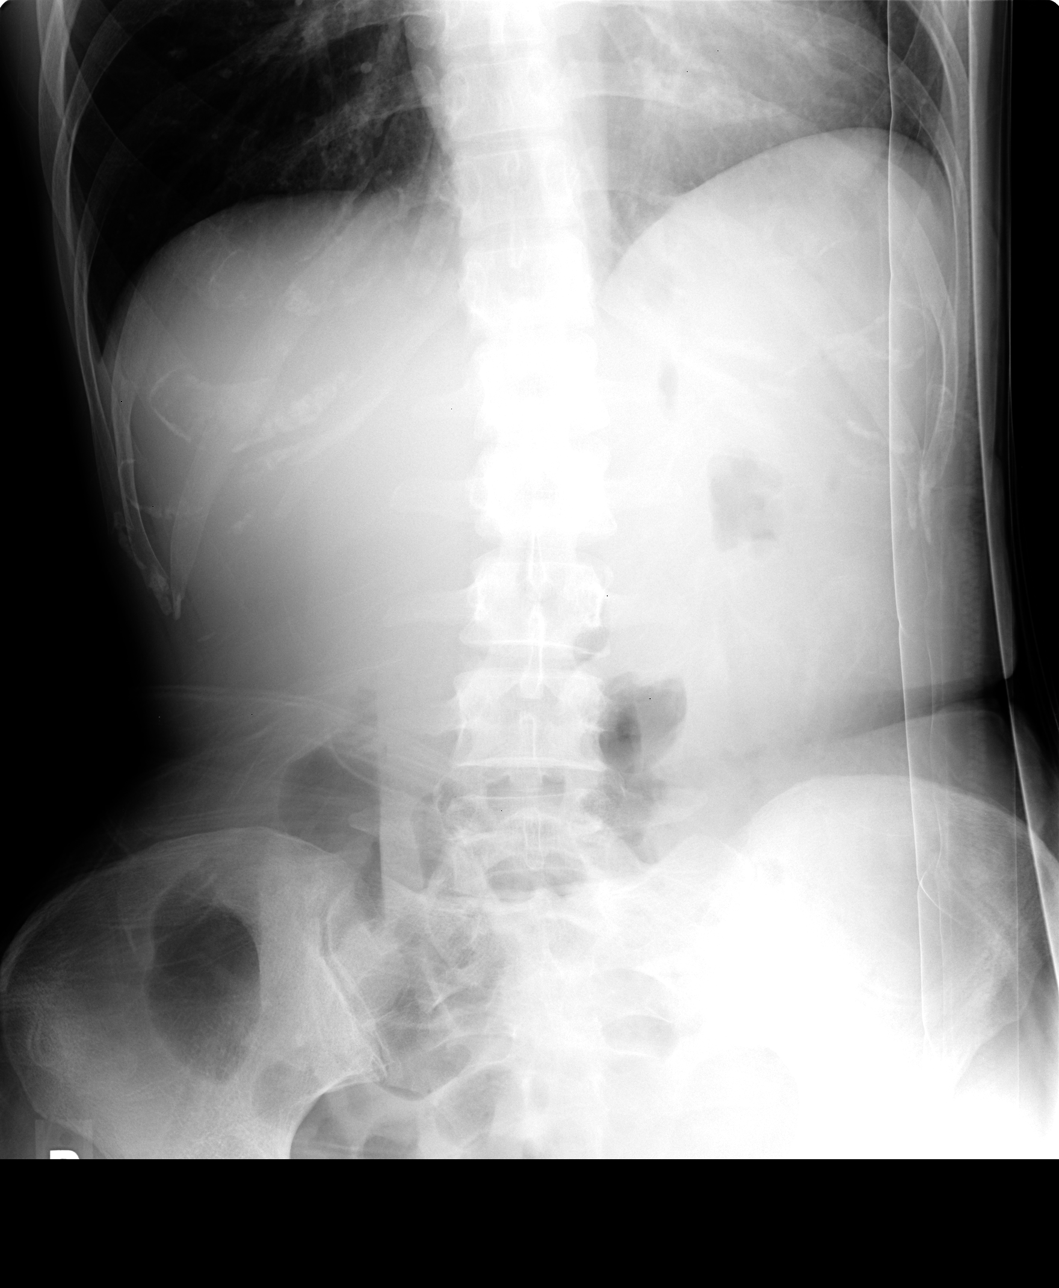

[view not recorded (3 of 3)]
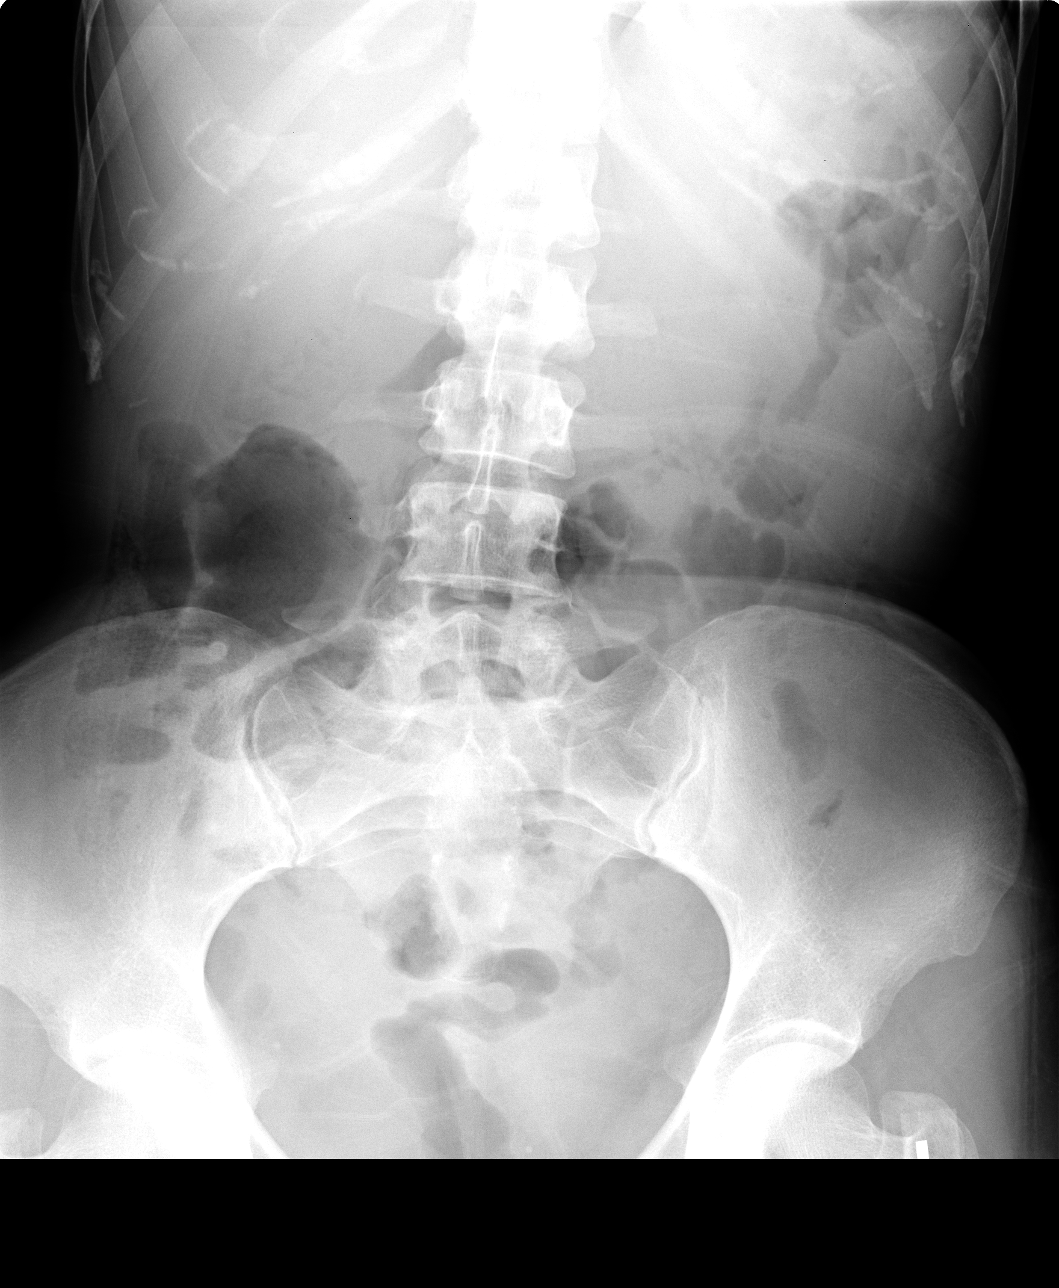

[3 of 3 positions shown; findings below may reference images not displayed]

IMPRESSION: 1. No acute cardiopulmonary findings.  Calcified granulomata scattered in the lungs.  
 2. Nonobstructed bowel gas pattern.  No free air.

## 2004-06-07 IMAGING — CT CT ABDOMEN W/ CM
1 of 4 series · 14 of 32 positions shown, 19 images · IV contrast (omnipaque)
Comparison: none

CLINICAL DATA: 38 year-old with abdominal pain. History of two previous bowel resections.
TECHNIQUE: Helical CT examination of the abdomen and pelvis was performed after bolus infusion of a total of 100 cc Omnipaque 300, and the use of dilute oral contrast.  This study is compared to a previous study from [DATE].
 The lung bases are clear except for dependent atelectasis.
 CT ABDOMEN W/CONTRAST:
 The liver, spleen, pancreas, adrenal glands and kidneys are unremarkable.  There is a slightly prominent extrarenal pelvis on the right.  There is free fluid in the abdomen, mainly around the liver and the hepatorenal fossa.  No abdominal masses or adenopathy. The aorta is normal in caliber. There are dilated loops of small bowel in the right lower quadrant with submucosal edema and enhancement of the serosa and mucosa.  This involves what appears to be the distal ileum.  It is most likely some type of infectious or inflammatory enteritis. Active Crohn?s disease would certainly be a consideration. Surgical changes are noted in this area.

[Series 3: — · axial · 0.65mm/px · z∈[+750,+1100]mm · 14 of 80 slices shown, 19 images]
[im 5/80  soft-tissue]
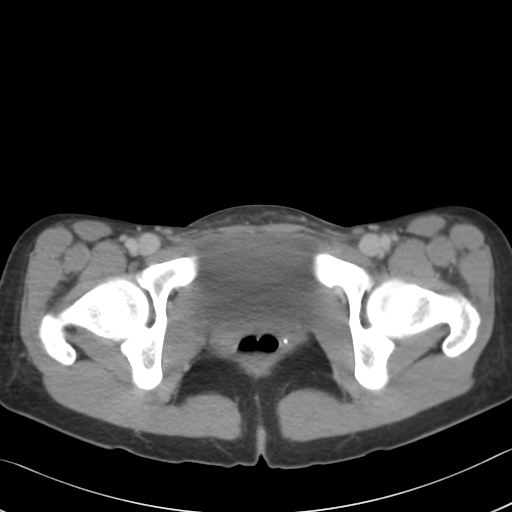
[im 5/80  bone]
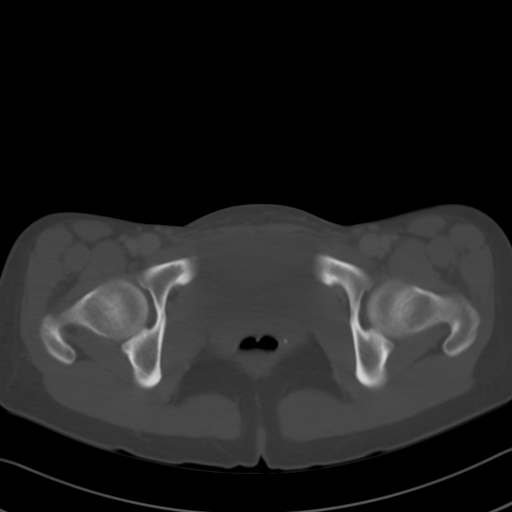
[im 10/80  soft-tissue]
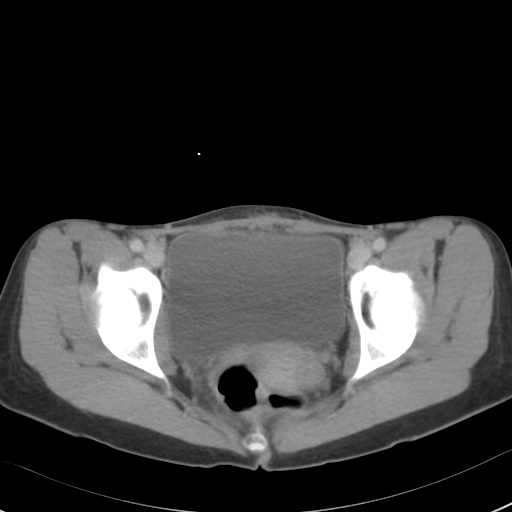
[im 19/80  soft-tissue]
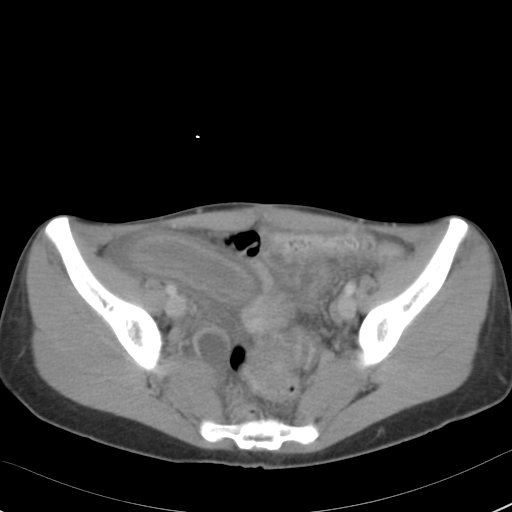
[im 24/80  soft-tissue]
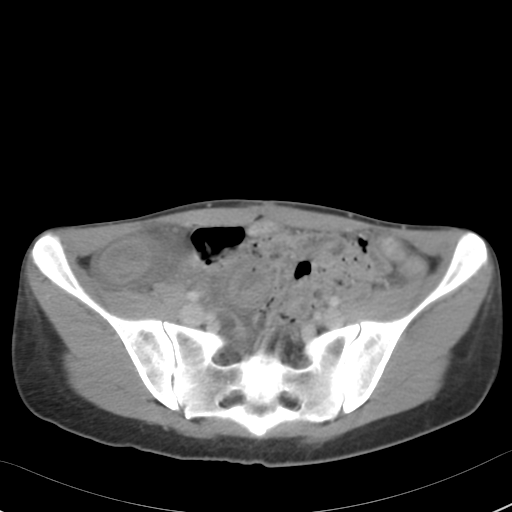
[im 28/80  soft-tissue]
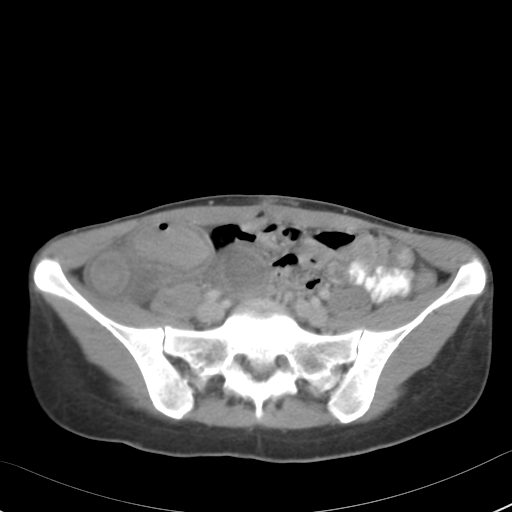
[im 33/80  soft-tissue]
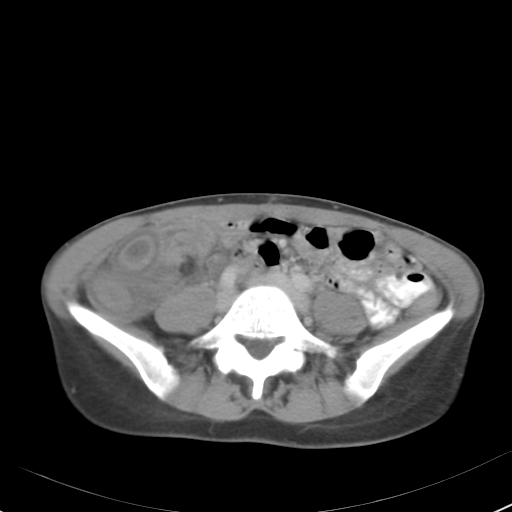
[im 42/80  soft-tissue]
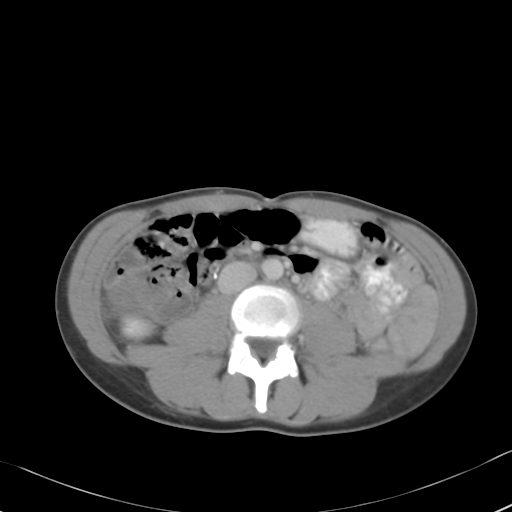
[im 47/80  soft-tissue]
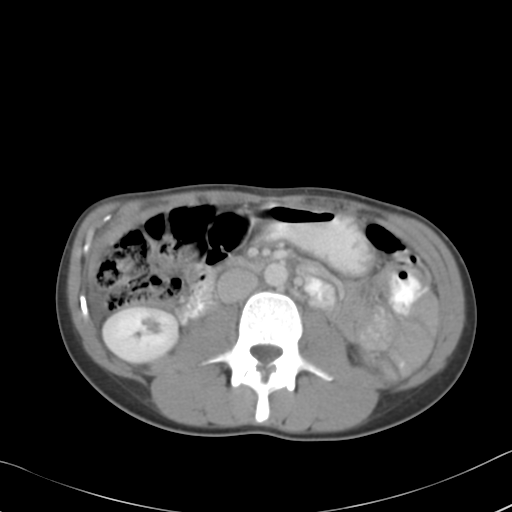
[im 52/80  soft-tissue]
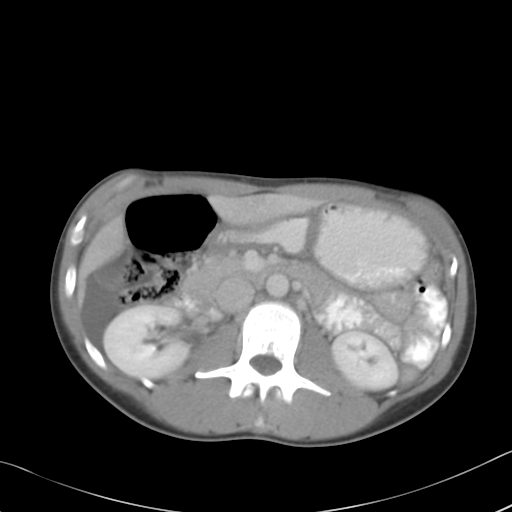
[im 52/80  bone]
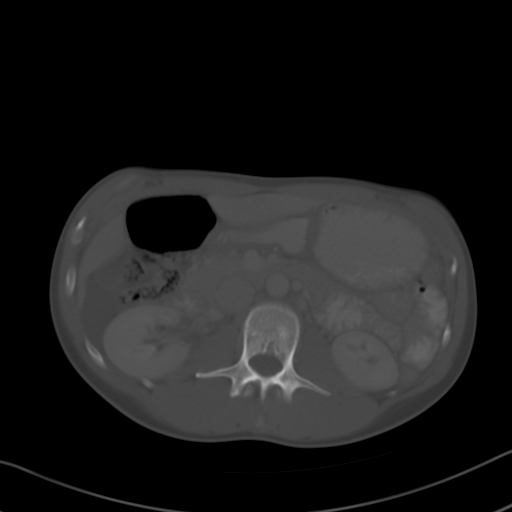
[im 56/80  soft-tissue]
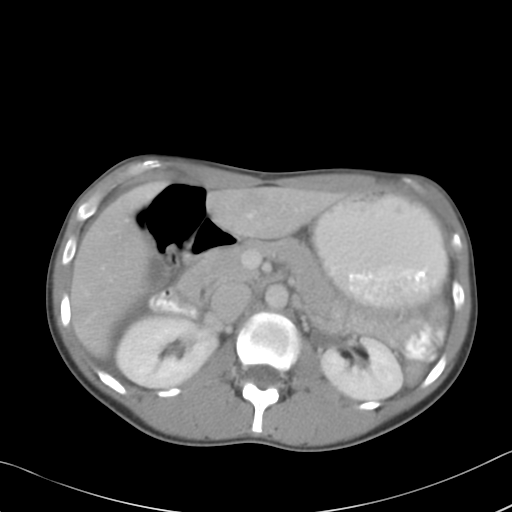
[im 61/80  soft-tissue]
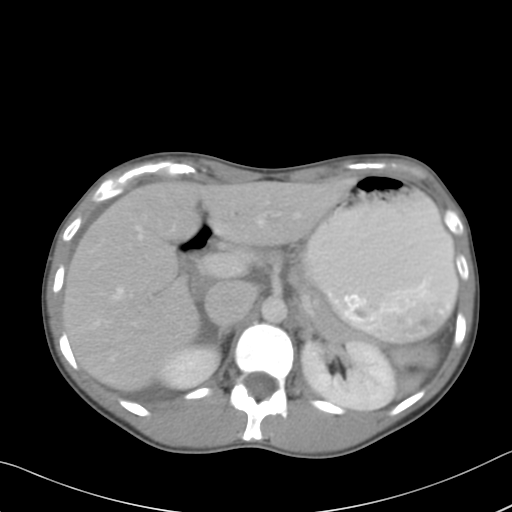
[im 61/80  lung]
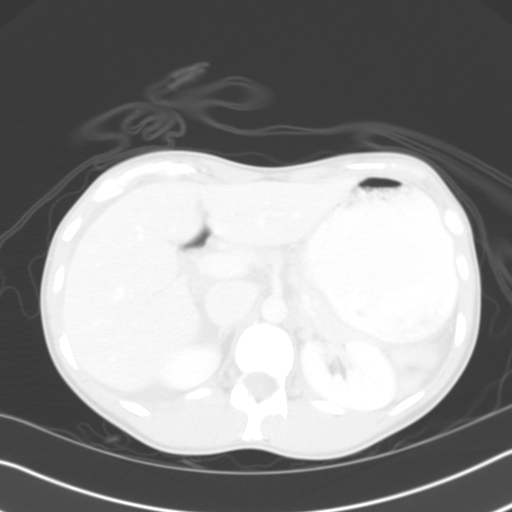
[im 66/80  lung]
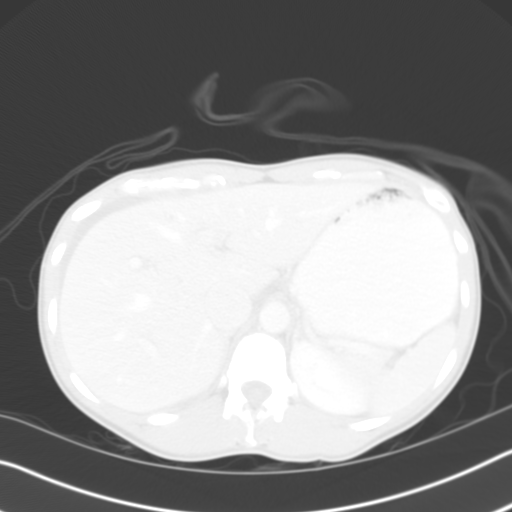
[im 70/80  soft-tissue]
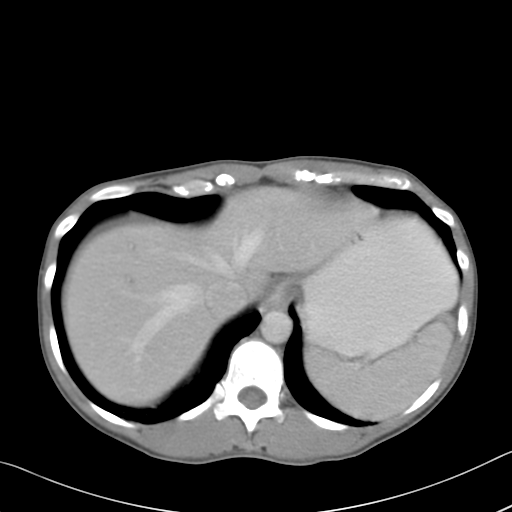
[im 70/80  lung]
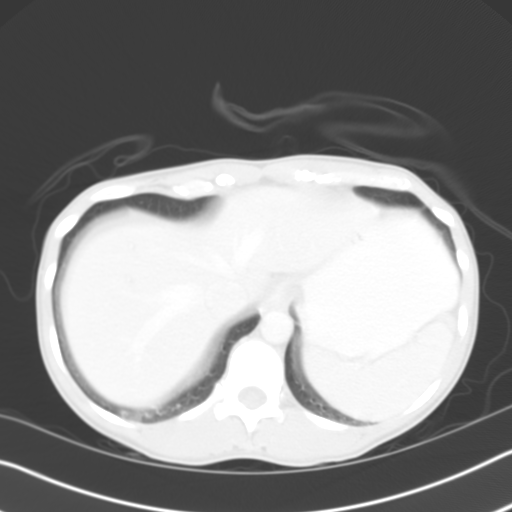
[im 75/80  soft-tissue]
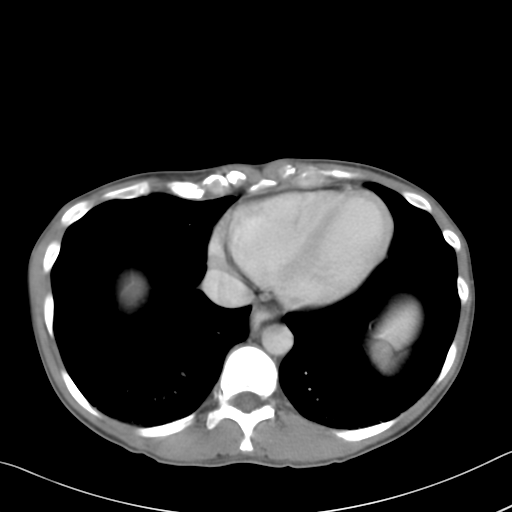
[im 75/80  lung]
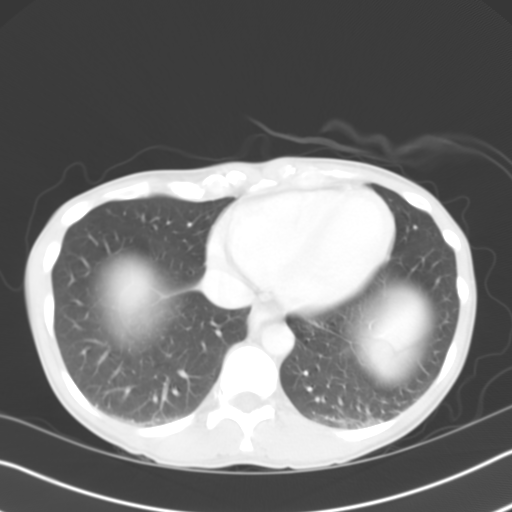

[14 of 32 positions shown; findings below may reference images not displayed]

IMPRESSION: Free abdominal fluid and dilated inflamed appearing small bowel loops in the right abdomen. There is no evidence for small bowel obstruction or free air.
 CT PELVIS W/CONTRAST:
 As described above, there are dilated inflamed appearing small bowel loops in the right lower quadrant.  There is a cyst associated with the right ovary which is a stable finding.  The uterus appears normal. There is a small amount of free pelvic fluid.  The rectum, sigmoid colon, and left-sided small bowel loops appear normal.
IMPRESSION: 1.  Dilated inflamed appearing small bowel loops in the right lower quadrant, likely infectious or inflammatory enteritis.
 2.  Stable right ovarian cyst.
 3.  Small amount of free pelvic fluid.

## 2004-06-08 IMAGING — CR DG ABDOMEN 2V
3 series · 3 of 3 positions shown · non-contrast
Comparison: CT abdomen and pelvis and acute abdomen series yesterday.

CLINICAL DATA: Acute inflammatory disease involving the small bowel. Abdominal
pain. Nausea/vomiting.

ABDOMEN - 2 VIEW  [DATE]:

[view not recorded (1 of 3)]
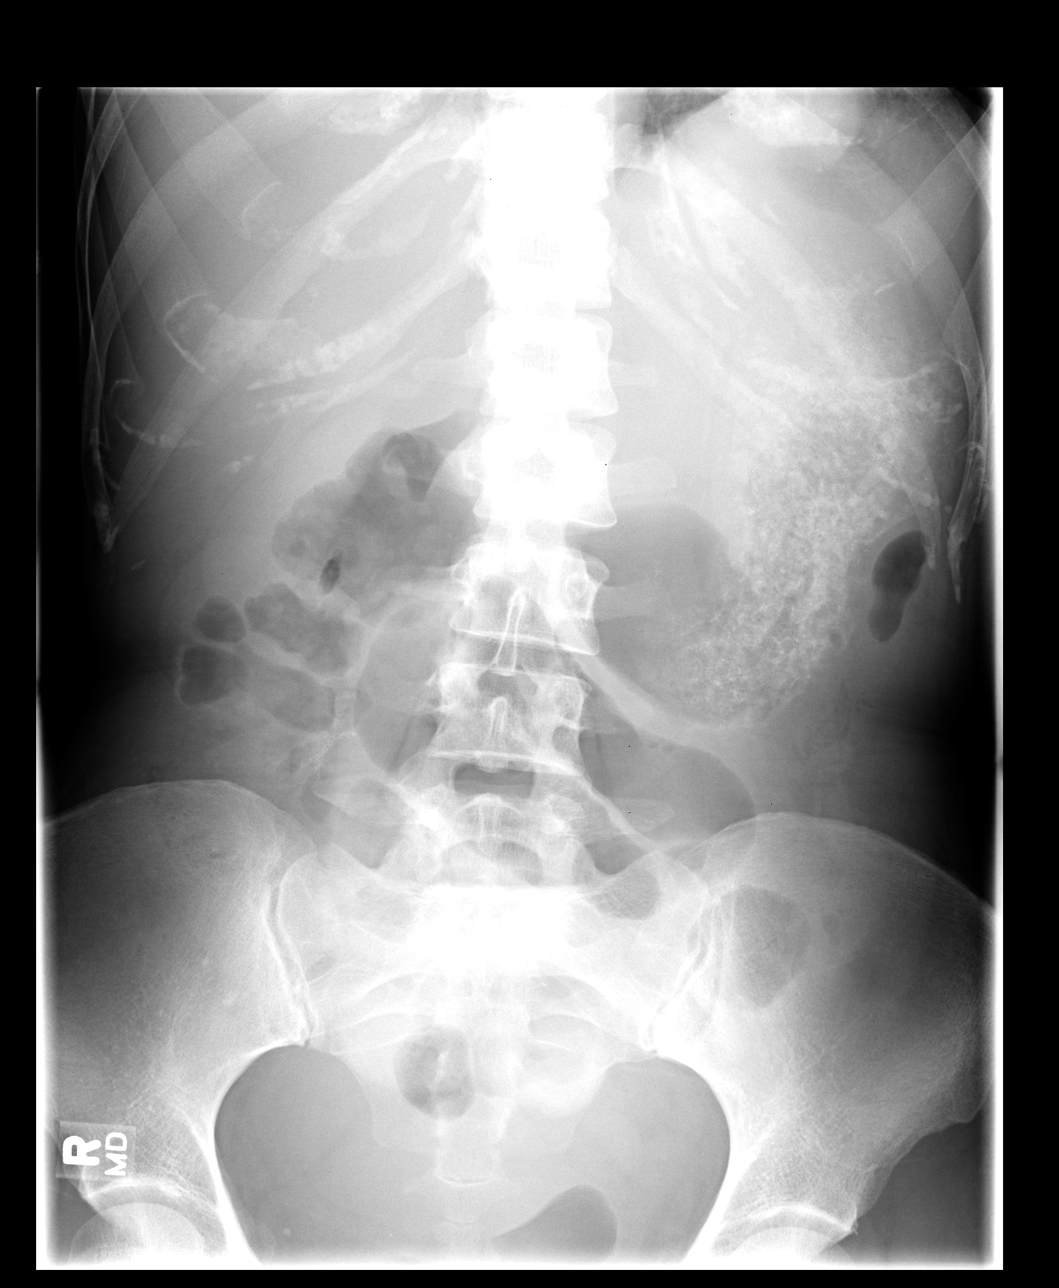

[view not recorded (2 of 3)]
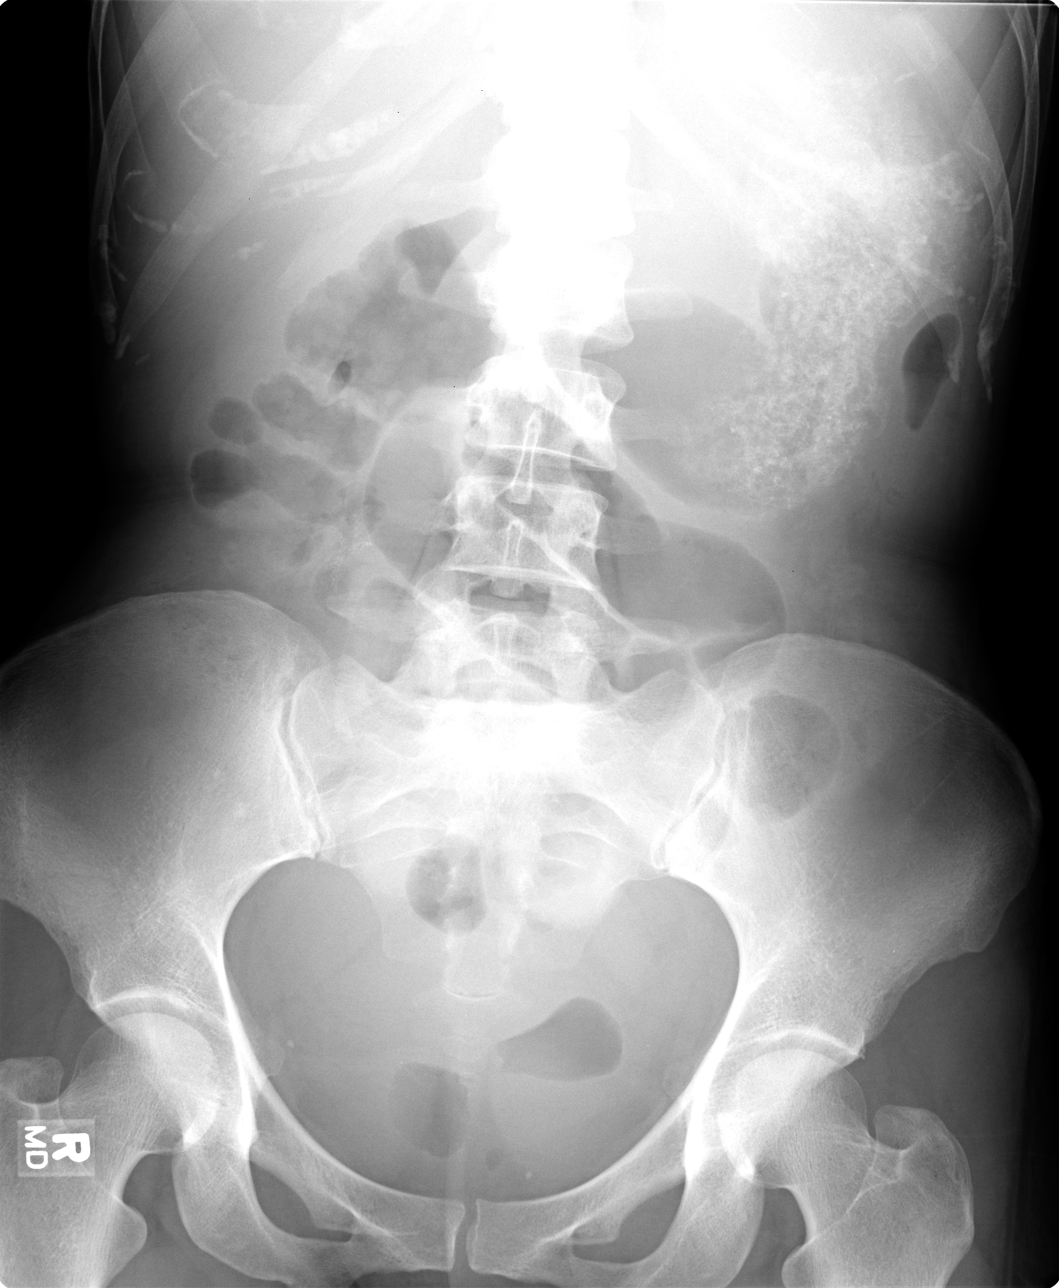

[view not recorded (3 of 3)]
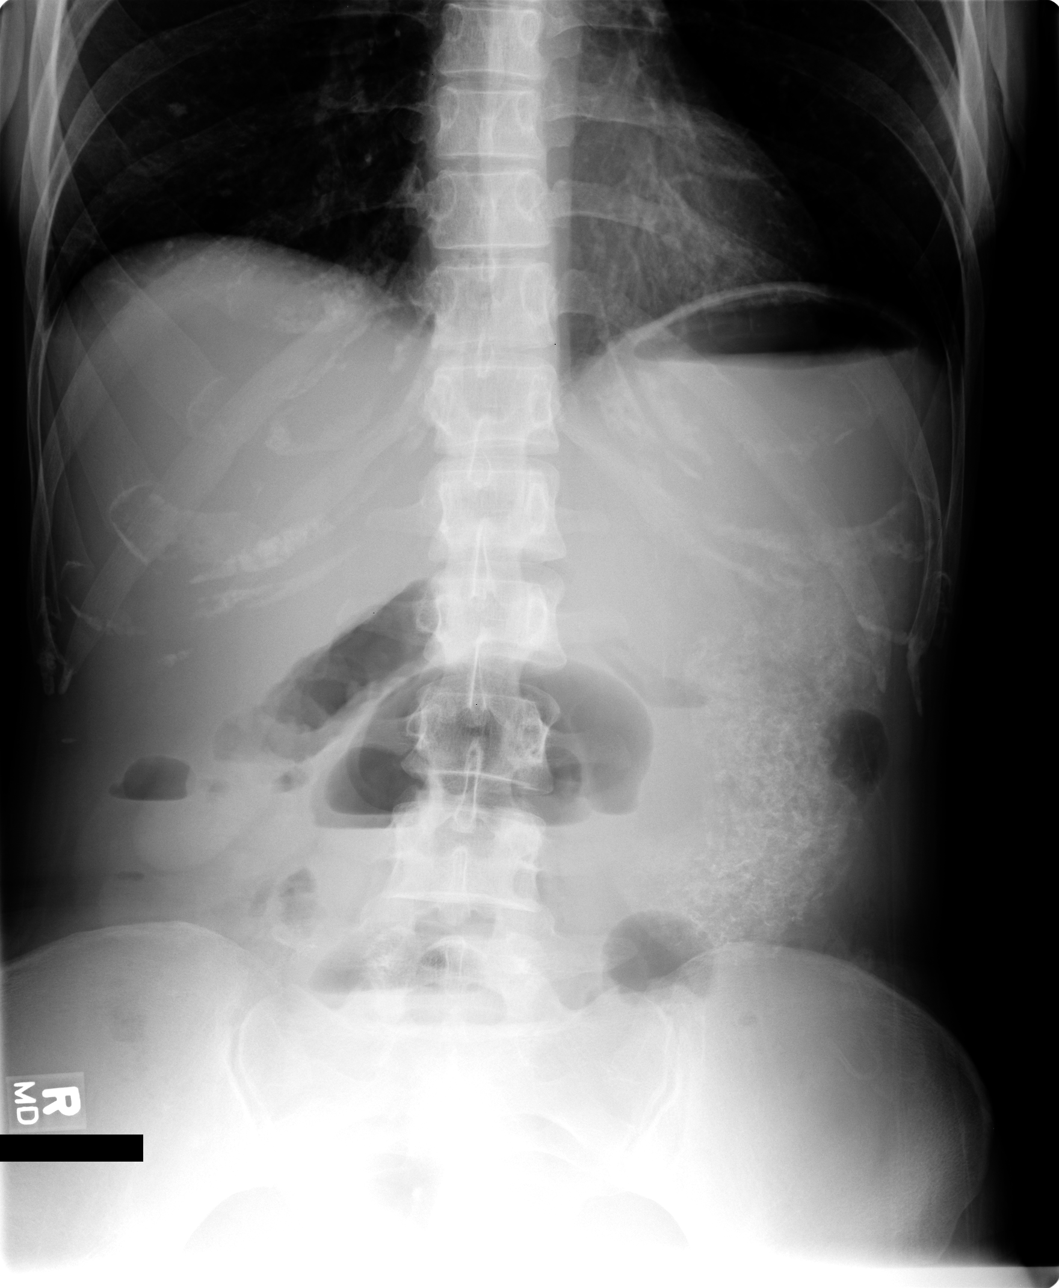

[3 of 3 positions shown; findings below may reference images not displayed]

FINDINGS: There is gaseous distention of a few loops of small bowel in the mid
abdomen, likely corresponding to the abnormal bowel loops identified on the CT
yesterday. There is residual contrast material in the stomach. Surgical suture
material is noted in the right mid abdomen There is no evidence of free
intraperitoneal air on the erect view. There are a few air-fluid levels
involving the abnormal loops of small bowel. No abnormal calcifications are
identified.
IMPRESSION: 1. Possible early small bowel obstruction, as there are dilated loops of small
bowel in the mid abdomen which demonstrate air-fluid levels on the erect view.

2. No evidence of free intraperitoneal air.

## 2004-06-09 IMAGING — CT CT PELVIS W/O CM
1 series · 15 of 32 positions shown, 19 images · IV contrast (agent unspecified)
Comparison: CT abdomen and pelvis [DATE].

CLINICAL DATA: Abdominal pain; nausea/vomiting; CT examination two days ago demonstrated an abnormal thick walled loop of bowel in the right lower quadrant
 CT OF THE PELVIS WITHOUT CONTRAST:
 Neither intravenous nor oral contrast were administered at the request of Dr. JARTSA.  Multidetector helical CT through the pelvis was performed beginning just above the umbilicus.

[Series 2: abd/pelvis 5.0 b30f · axial · 0.66mm/px · z∈[-504,-284]mm · 15 of 50 slices shown, 19 images]
[im 4/50  soft-tissue]
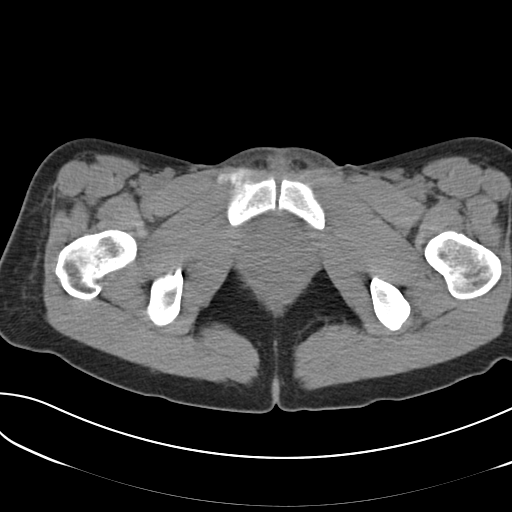
[im 4/50  bone]
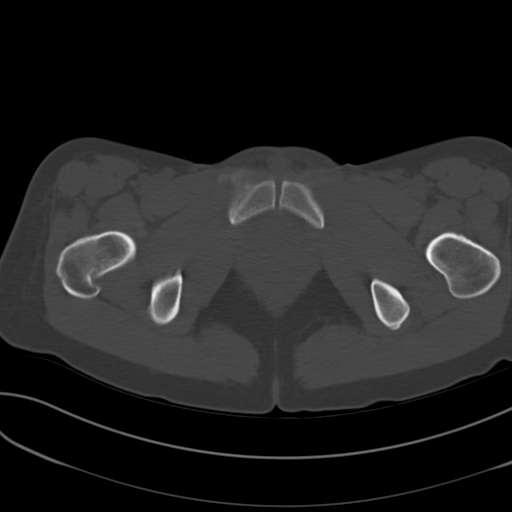
[im 7/50  soft-tissue]
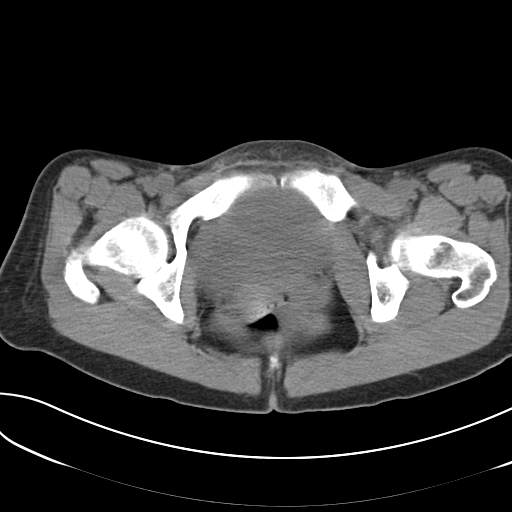
[im 10/50  soft-tissue]
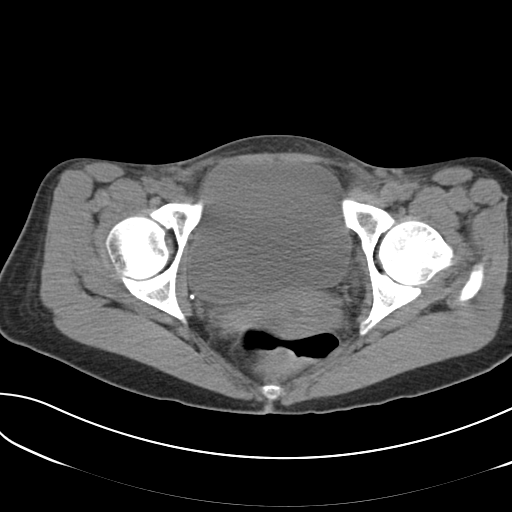
[im 15/50  soft-tissue]
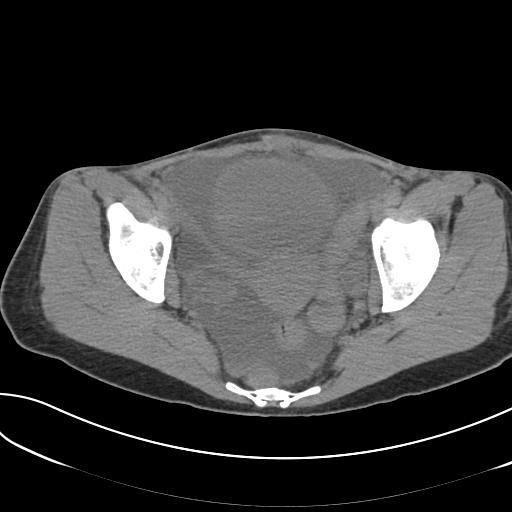
[im 18/50  soft-tissue]
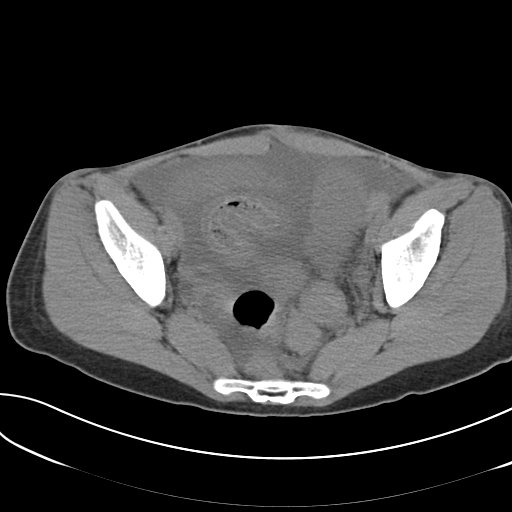
[im 21/50  soft-tissue]
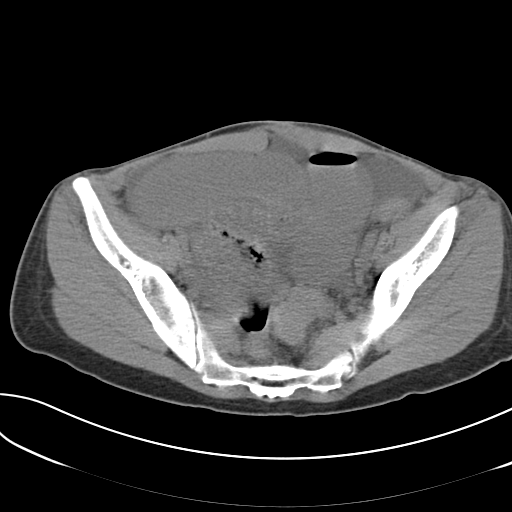
[im 26/50  soft-tissue]
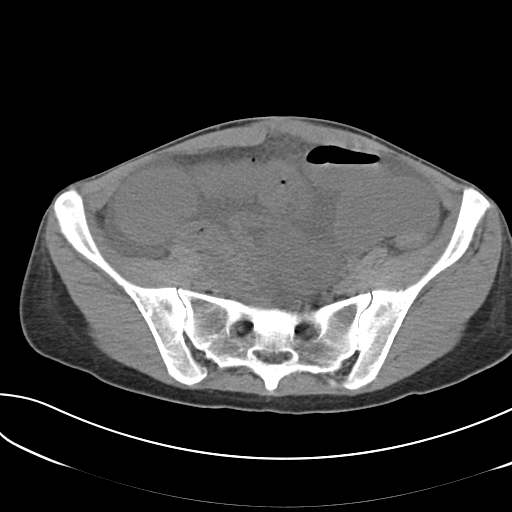
[im 29/50  soft-tissue]
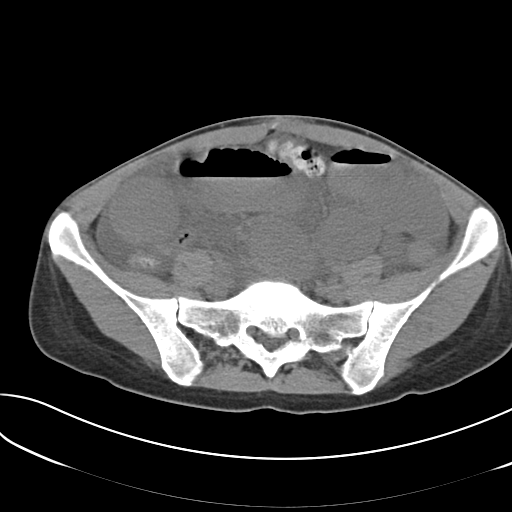
[im 32/50  soft-tissue]
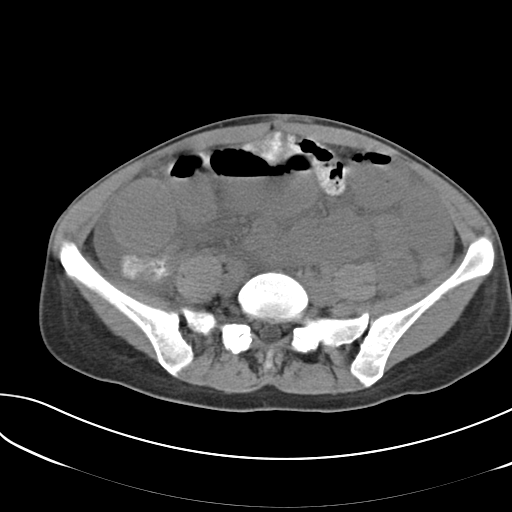
[im 32/50  bone]
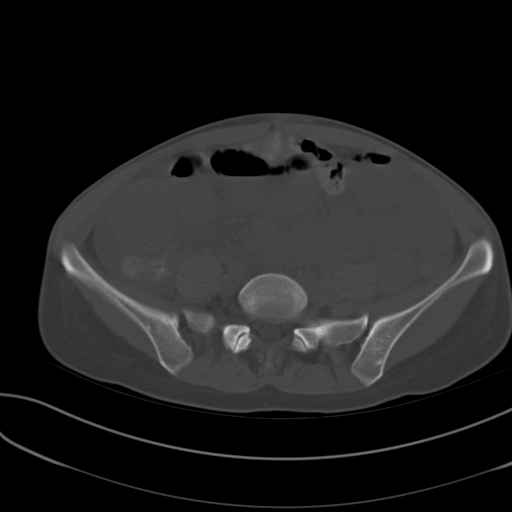
[im 35/50  soft-tissue]
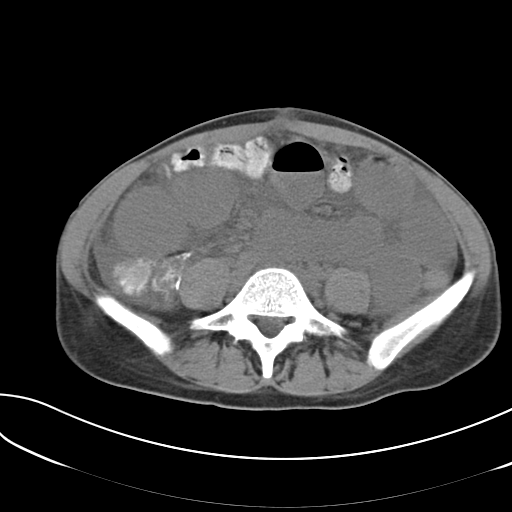
[im 40/50  soft-tissue]
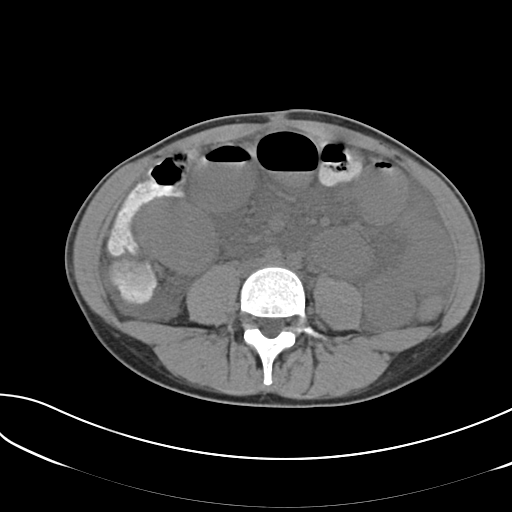
[im 43/50  soft-tissue]
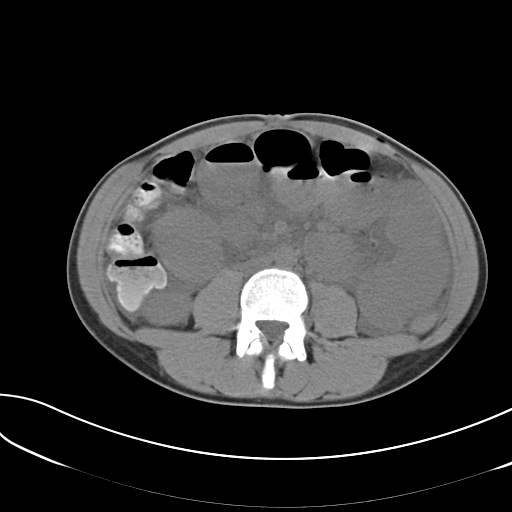
[im 43/50  lung]
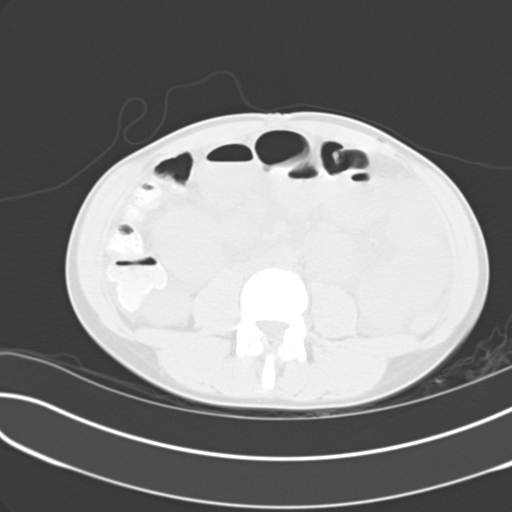
[im 45/50  lung]
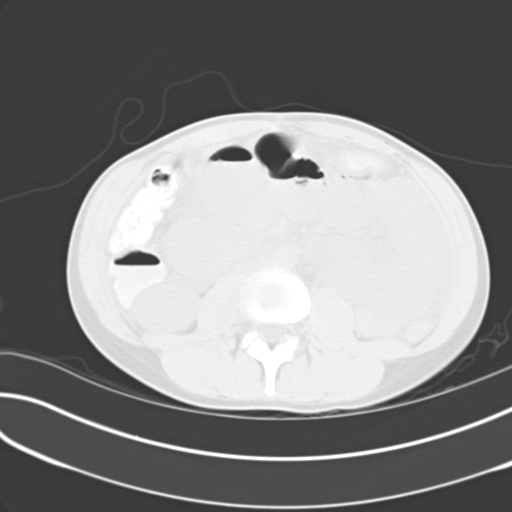
[im 46/50  soft-tissue]
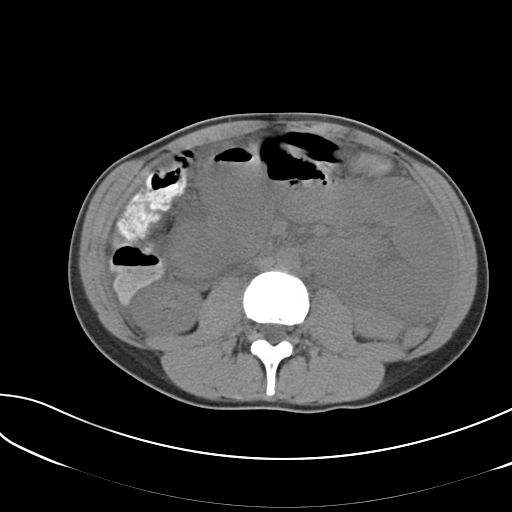
[im 46/50  lung]
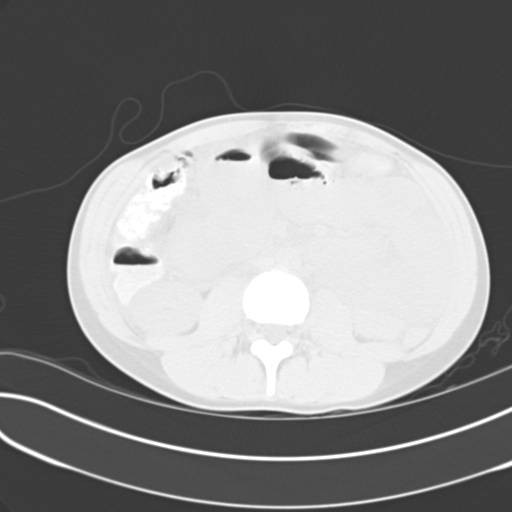
[im 48/50  lung]
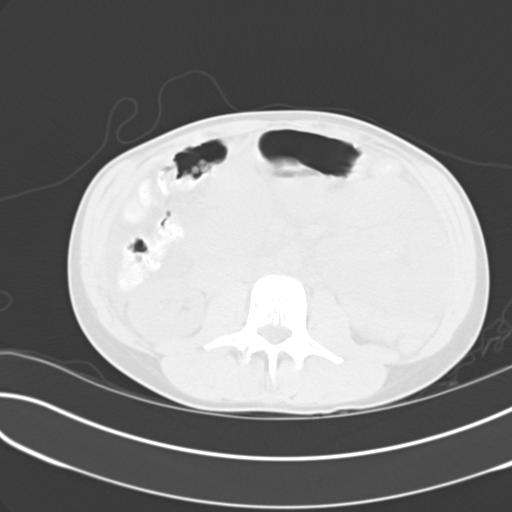

[15 of 32 positions shown; findings below may reference images not displayed]

Since the previous examination, the patient has developed multiple dilated loops of fluid filled small bowel throughout the visualized abdomen and pelvis.  Without intravenous contrast, I cannot comment on the loop of small bowel that was thick walled on the previous examination, as I cannot determine enhancement of the mucosa.  However, since the prior examination, the patient has developed a moderately large amount of ascites.  The contrast material which was in the small bowel on the previous CT two days ago has entered the colon.  The uterus and adnexa remain unremarkable.  The urinary bladder is unremarkable.
IMPRESSION: Since the CT examination two days ago:
 1.  Development of small bowel obstruction with multiple dilated fluid filled loops of small bowel throughout the visualized abdomen and pelvis.  
 2.  Development of a moderate large amount of ascites.  
 THESE RESULTS WERE DISCUSSED BY TELEPHONE WITH DR. JARTSA AT THE TIME OF INTERPRETATION ON [DATE] AT [2G] HOURS.

## 2004-06-10 ENCOUNTER — Encounter (INDEPENDENT_AMBULATORY_CARE_PROVIDER_SITE_OTHER): Payer: Self-pay | Admitting: *Deleted

## 2004-06-10 IMAGING — CR DG ABDOMEN 2V
3 series · 3 of 3 positions shown · non-contrast
Comparison: none

CLINICAL DATA: 38-year-old with abdominal pain, nausea and vomiting.    Evaluate for small bowel obstruction.
 TWO VIEW ABDOMEN:
 Comparison to CT on [DATE] and plain films on [DATE].
 Supine and erect views are performed of the abdomen, showing dilated small bowel loops in the central abdomen.  Gas-filled, nondilated colonic loops are also identified.  The findings are consistent with partial or early small bowel obstruction.  There is no evidence for free intraperitoneal air.  Surgical clips are seen in the right lower quadrant.

[view not recorded (1 of 3)]
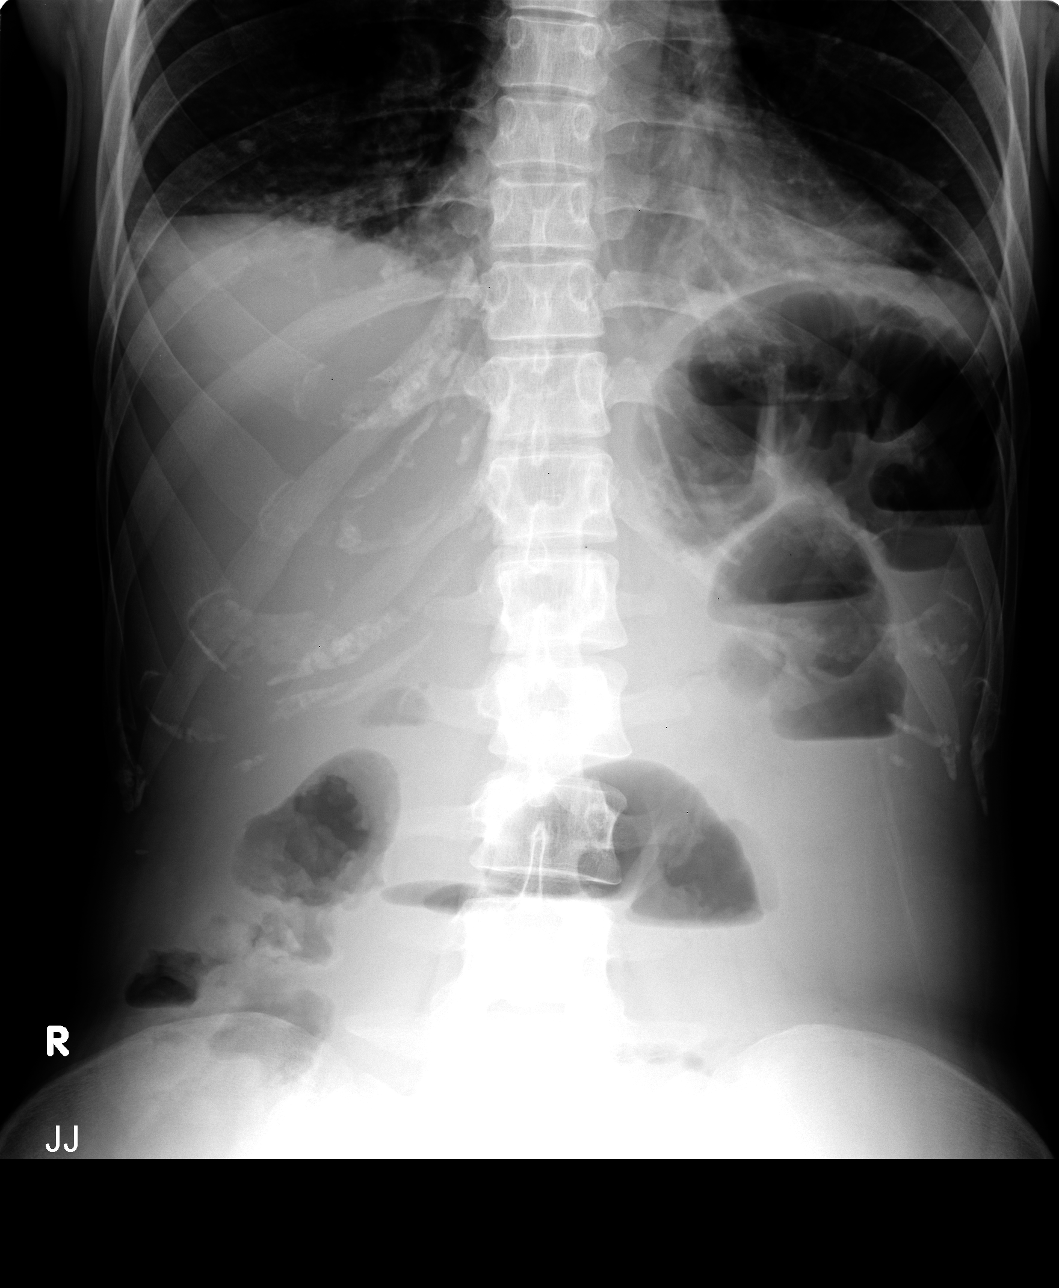

[view not recorded (2 of 3)]
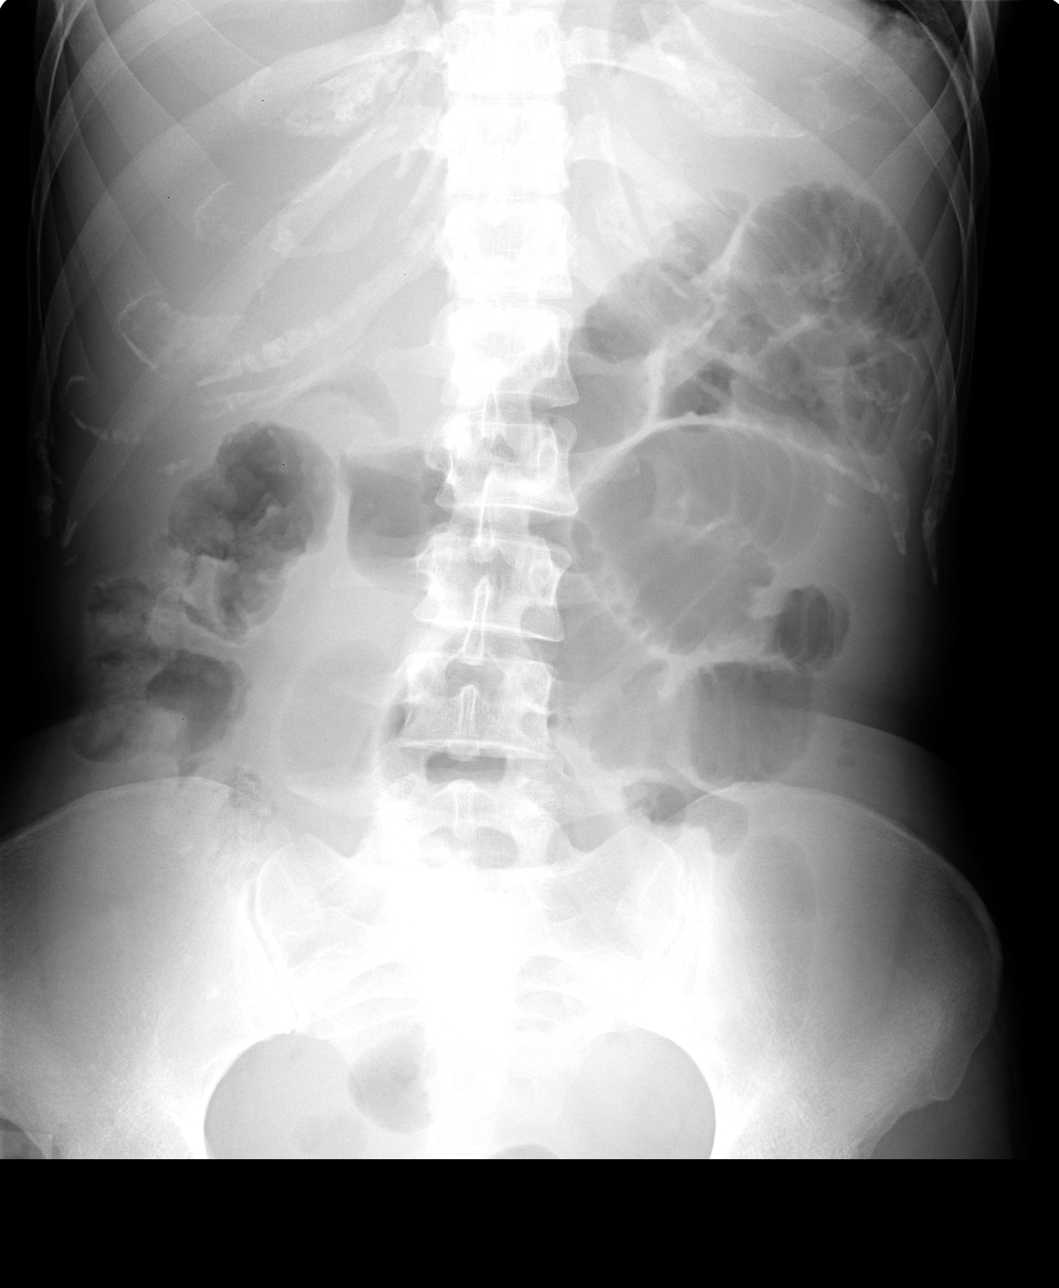

[view not recorded (3 of 3)]
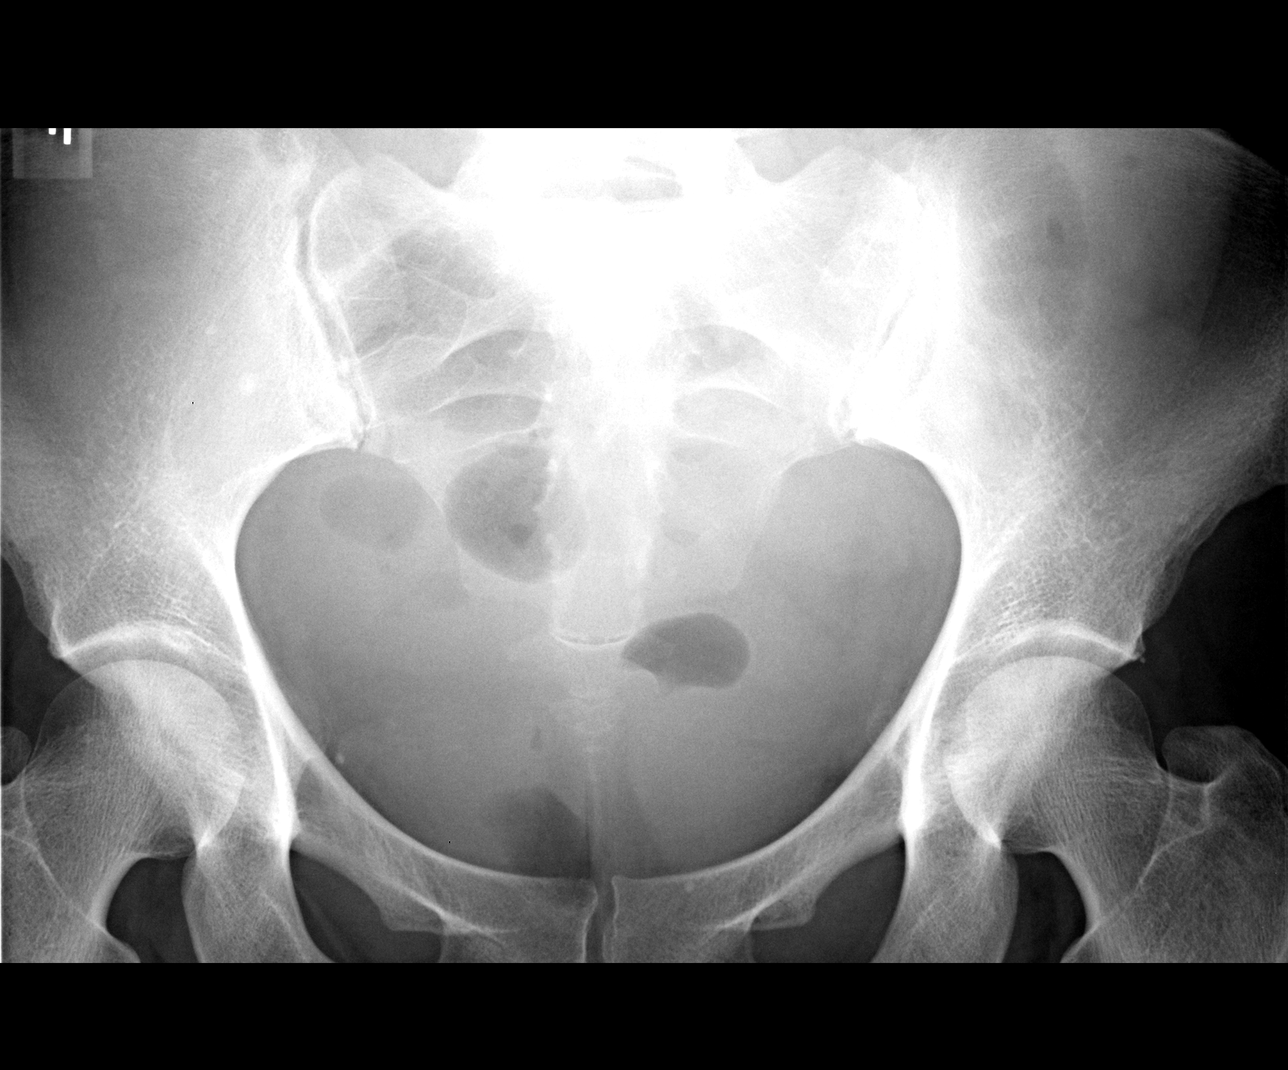

[3 of 3 positions shown; findings below may reference images not displayed]

IMPRESSION: Partial or early small bowel obstruction.

## 2004-08-16 ENCOUNTER — Ambulatory Visit (HOSPITAL_COMMUNITY): Admission: RE | Admit: 2004-08-16 | Discharge: 2004-08-16 | Payer: Self-pay | Admitting: Internal Medicine

## 2004-08-21 ENCOUNTER — Ambulatory Visit (HOSPITAL_COMMUNITY): Admission: RE | Admit: 2004-08-21 | Discharge: 2004-08-21 | Payer: Self-pay | Admitting: Gastroenterology

## 2004-08-21 IMAGING — XA IR ANGIO/VISCERAL SELECTIVE EA VESSEL WO/W FLUSH
1 series · 14 of 24 positions shown · IV contrast (omnipaque)
Comparison: none

CLINICAL DATA: Terminal ileum infarction. 
 VISCERAL ANGIOGRAPHY ? [DATE] ([SG] HOURS):
 Vessels selected:  Celiac, SMA, IMA
 First order of contrast:  [SG] of Omnipaque 300
 Fluoroscopy time: 7 minutes. 
 Allergies:  None. 
 Medications administered: Versed 4 mg, Fentanyl 100 mcg. 
 Complications:  None.
TECHNIQUE: The right groin was prepped and draped in a sterile fashion. Lidocaine was utilized for local anesthesia.  A 19-gauge needle was inserted into the right common femoral artery. A Bentson wire was advanced through the needle into the arterial system.   A 4 french pigtail catheter was advanced over the Bentson wire into the abdominal aorta.  Lateral abdominal aortography was performed. 
 The pigtail was then pulled to the distal aorta and a RAO oblique aortogram was performed to evaluate the takeoff of the IMA.  The pigtail catheter was exchanged over a Bentson wire for a 5 French SOS omni catheter. The celiac, SMA and IMA were selected with angiography performed. The SOS omni catheter was removed over a wire.  The catheter was removed and hemostasis was achieved with direct pressure.  No complications were encountered.

[Series 1000: run · 0.17mm/px · 14 of 100 slices shown]
[im 1/100]
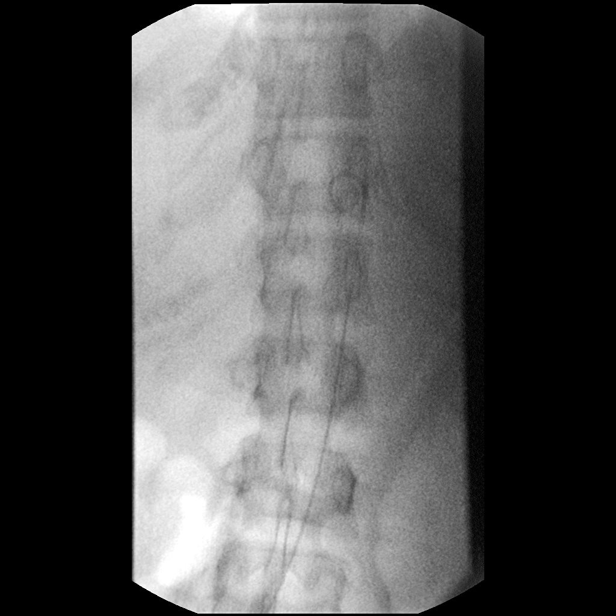
[im 9/100]
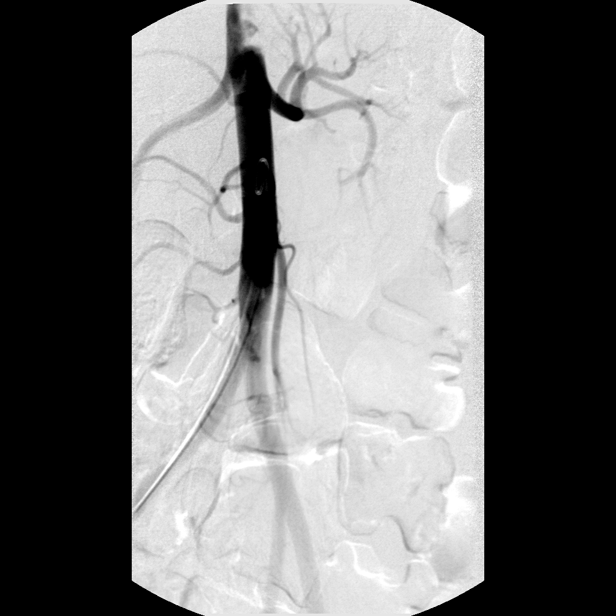
[im 18/100]
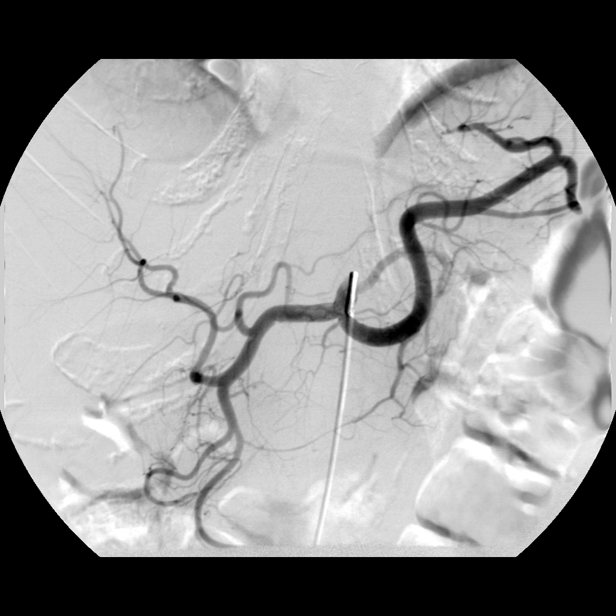
[im 26/100]
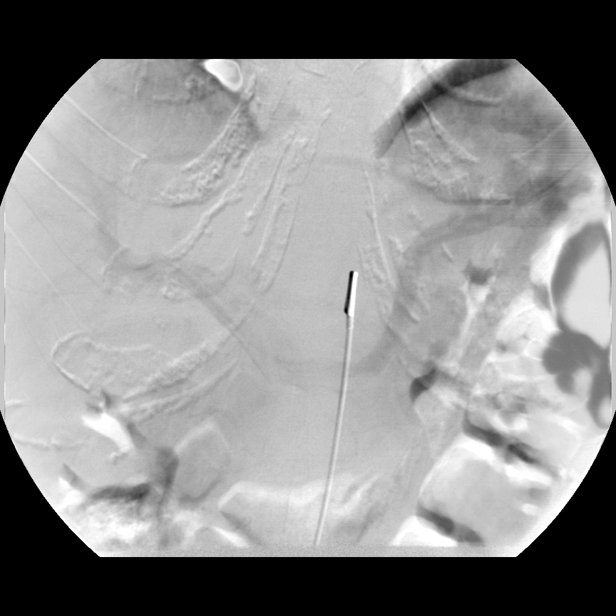
[im 31/100]
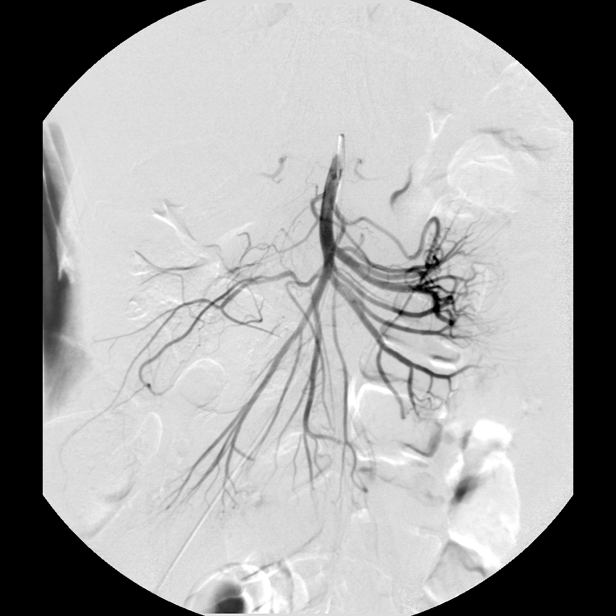
[im 39/100]
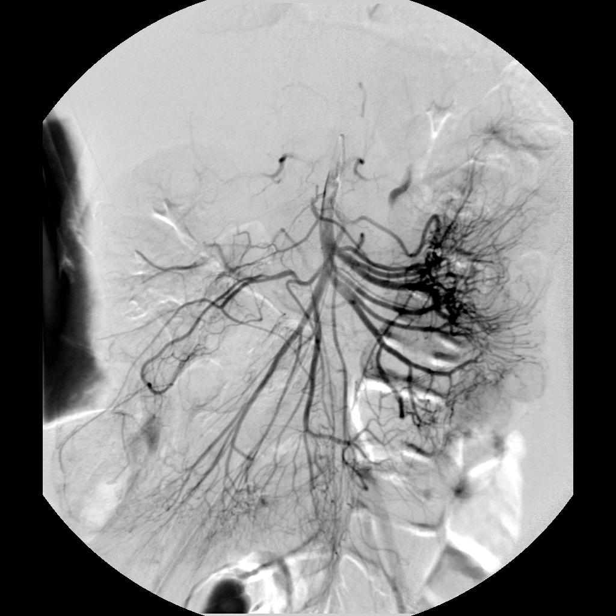
[im 48/100]
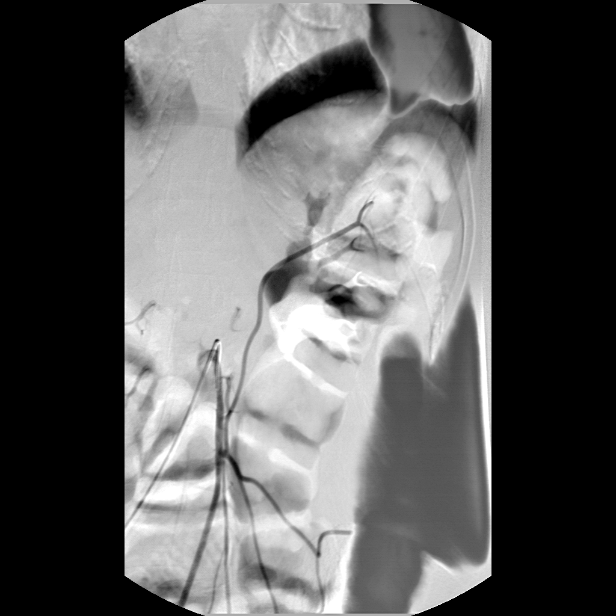
[im 52/100]
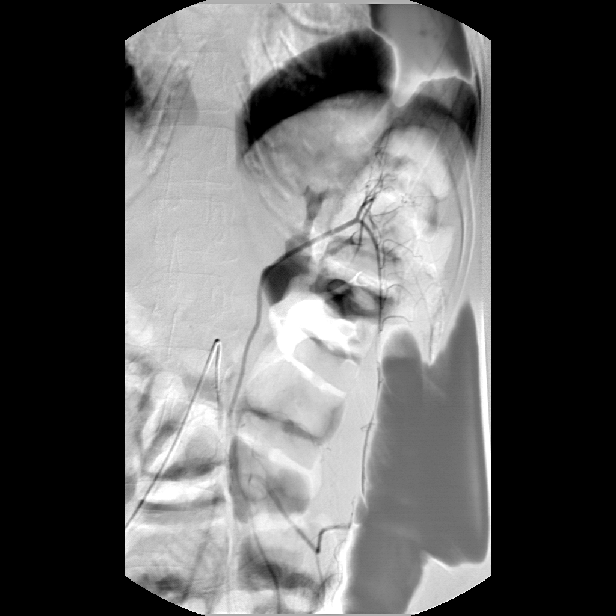
[im 61/100]
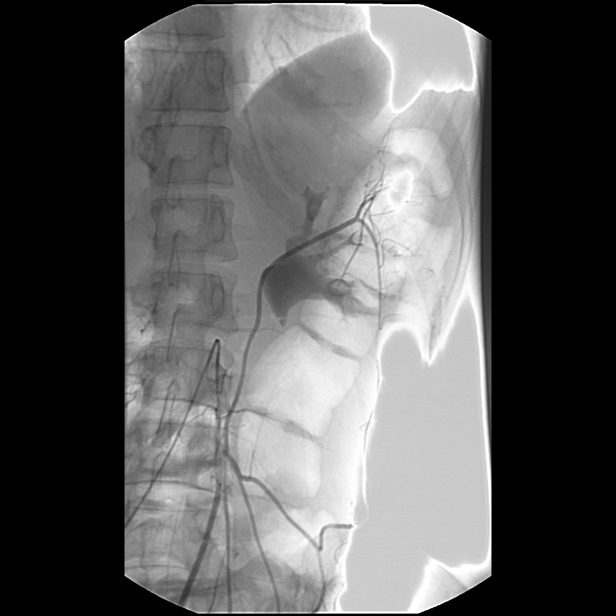
[im 69/100]
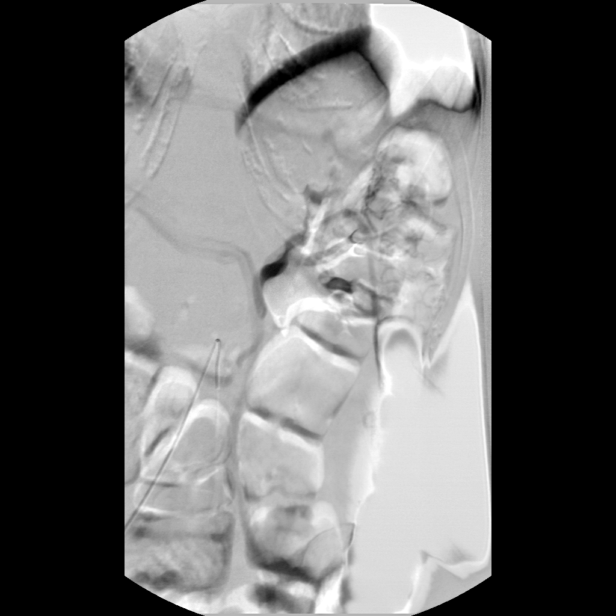
[im 78/100]
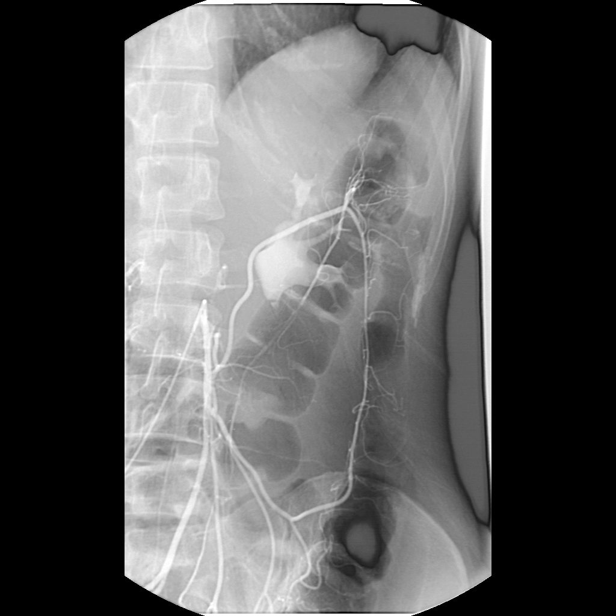
[im 82/100]
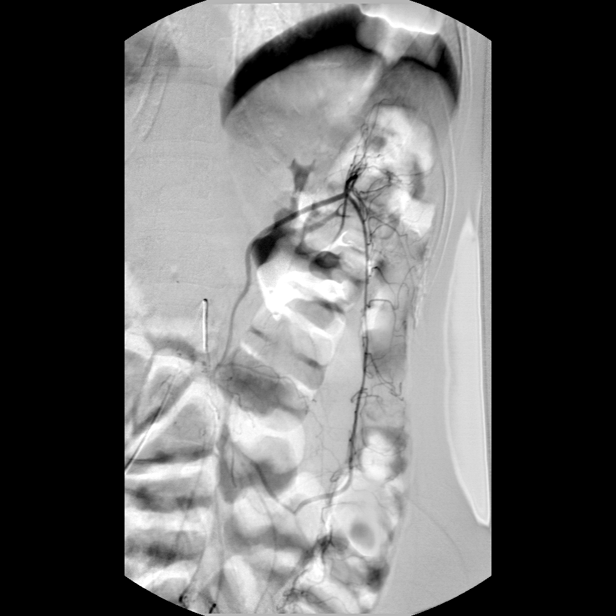
[im 91/100]
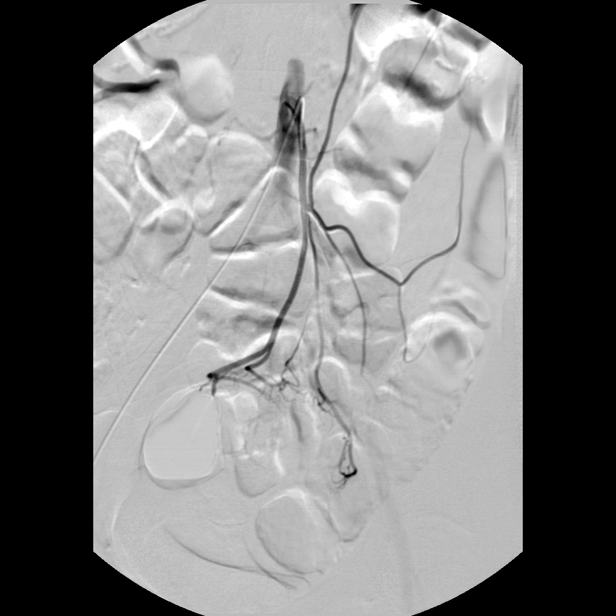
[im 100/100]
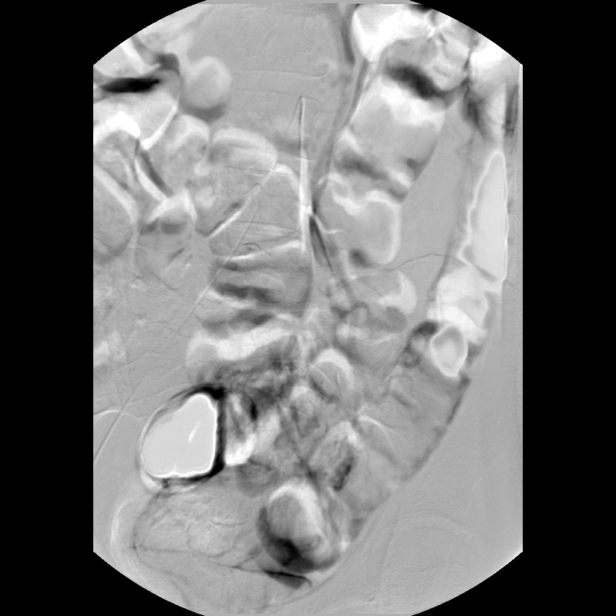

[14 of 24 positions shown; findings below may reference images not displayed]

FINDINGS: Lateral abdominal aortography demonstrates that the origin of the SMA and celiac are patent.  Distal aortography demonstrates that the origin of the IMA is patent.  
 Selective angiography of the celiac demonstrates that the left gastric artery, splenic artery and common hepatic artery are patent.  
 Selective angiography of the SMA demonstrates that the SMA and its branches are patent. Specifically, the middle colic, right colic, ileocolic and jejunal branches are all patent.   There is no pseudoaneurysm or vascular malformation.  Competitive flow in inferior pancreatoduodenal arteries is noted. 
 Selective angiography of the IMA demonstrates that the left colic and sigmoidal branches of the IMA are patent.
IMPRESSION: 3 vessel visceral angiography demonstrates no significant arterial stenotic disease to cause ischemia in the ileum.

## 2004-09-03 ENCOUNTER — Ambulatory Visit: Payer: Self-pay | Admitting: Oncology

## 2004-10-28 ENCOUNTER — Ambulatory Visit: Payer: Self-pay | Admitting: Oncology

## 2005-02-08 ENCOUNTER — Emergency Department (HOSPITAL_COMMUNITY): Admission: EM | Admit: 2005-02-08 | Discharge: 2005-02-08 | Payer: Self-pay | Admitting: Emergency Medicine

## 2005-02-08 IMAGING — CR DG ABDOMEN ACUTE W/ 1V CHEST
4 series · 4 of 4 positions shown · non-contrast
Comparison: none

CLINICAL DATA: Prior colectomy.  Endometriosis.  Abdominal pain.  
 ACUTE ABDOMEN - 2 VIEW AND CHEST - 1 VIEW:
 Supine and upright views show an unremarkable bowel gas pattern without evidence of ileus or obstruction.  No worrisome calcifications or bony findings.  
 The lungs are hyperinflated.  There are a view scattered granulomas and some pulmonary scarring.  No evidence of active infiltrate, mass, effusion, or collapse.

[w chest pa]
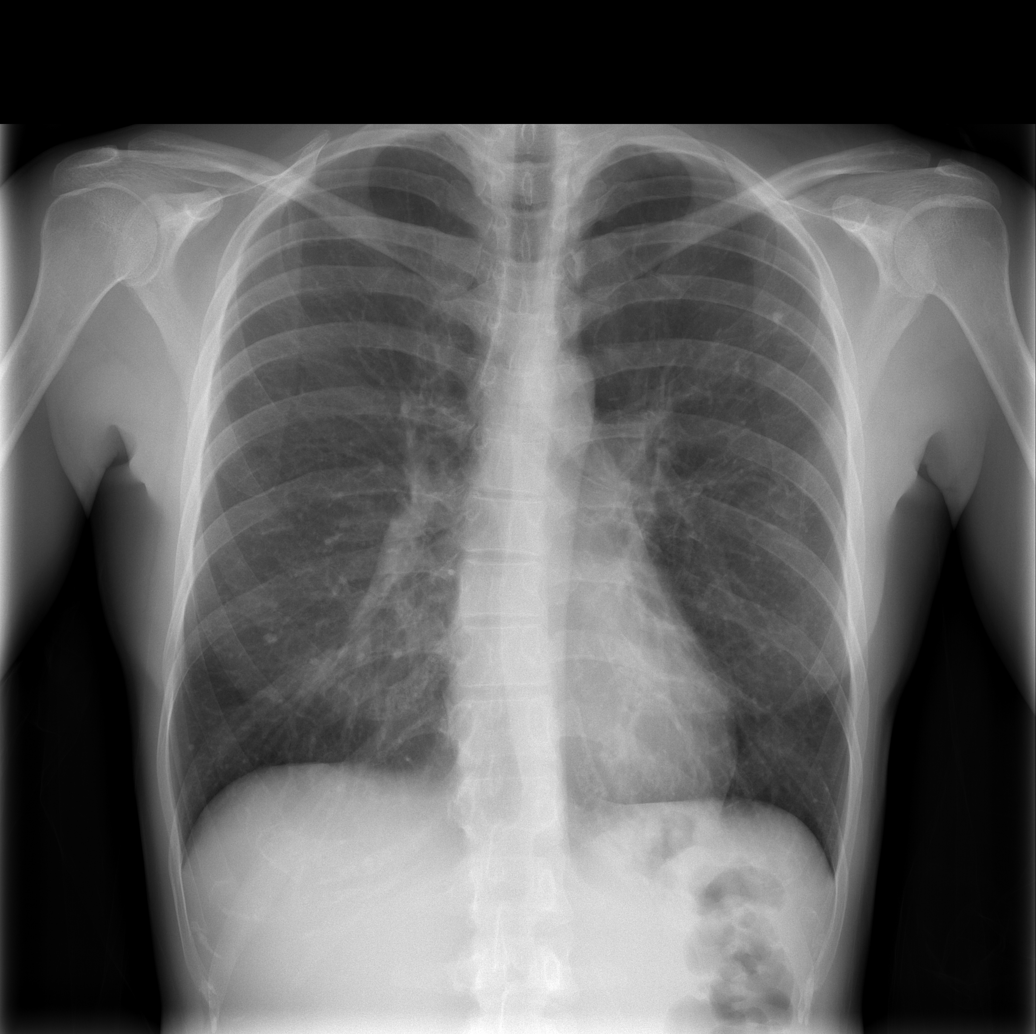

[w abdomen upright *]
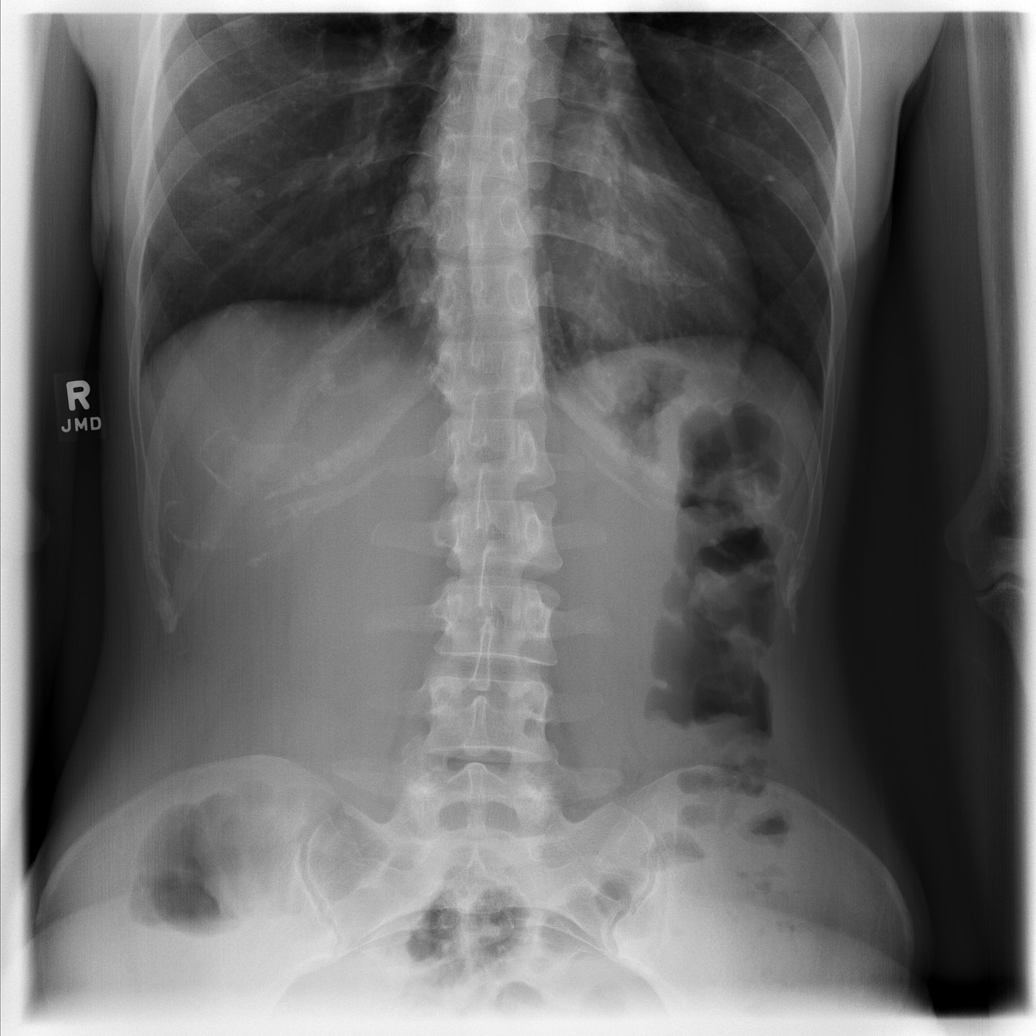

[t abdomen supine (1 of 2)]
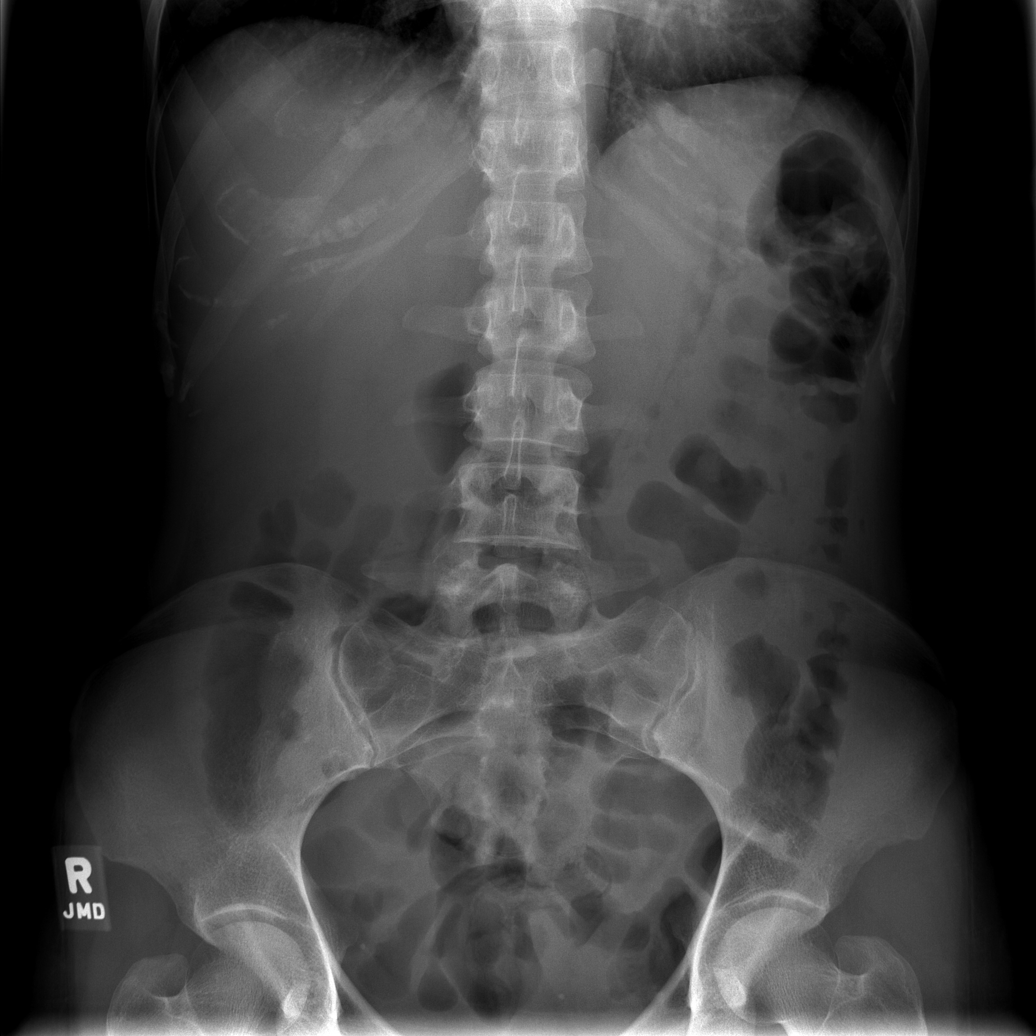

[t abdomen supine (2 of 2)]
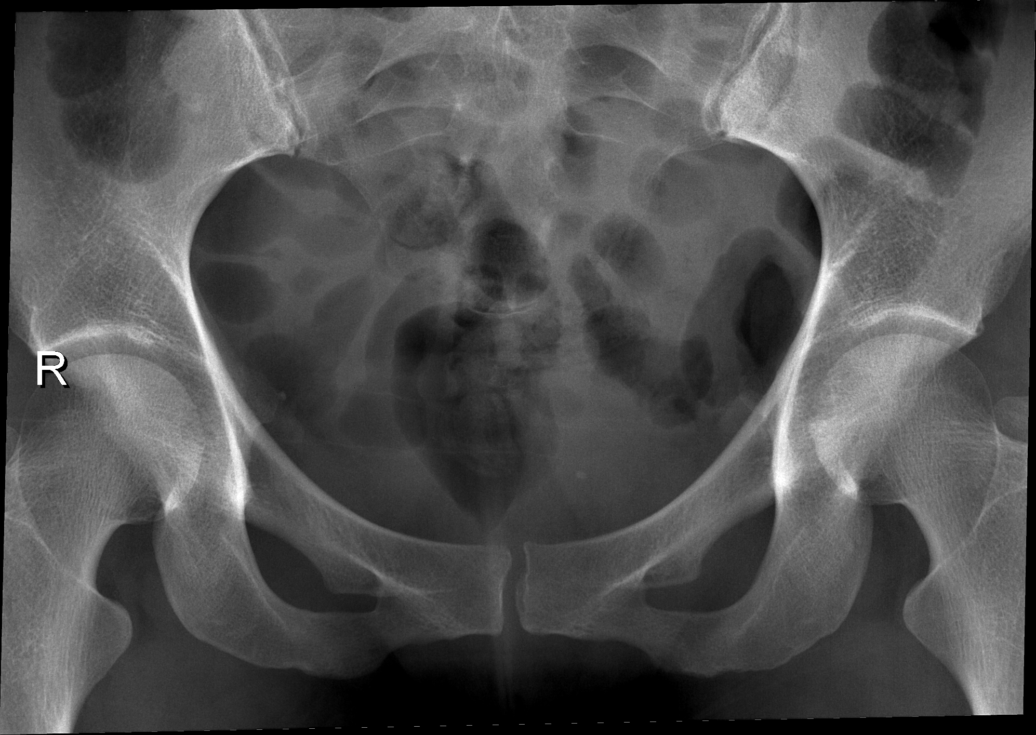

[4 of 4 positions shown; findings below may reference images not displayed]

IMPRESSION: 1.  Unremarkable abdominal radiographs. 
 2.  Chronic lung disease with scarring and granulomas.

## 2006-06-16 ENCOUNTER — Emergency Department (HOSPITAL_COMMUNITY): Admission: EM | Admit: 2006-06-16 | Discharge: 2006-06-17 | Payer: Self-pay | Admitting: Emergency Medicine

## 2006-09-15 ENCOUNTER — Encounter: Admission: RE | Admit: 2006-09-15 | Discharge: 2006-09-15 | Payer: Self-pay | Admitting: Internal Medicine

## 2006-09-15 IMAGING — CT CT ABDOMEN W/ CM
2 of 5 series · 16 of 46 positions shown, 18 images · IV contrast (READICAT/WATER & [ID] OMNI 300)
Comparison: [DATE]

ABDOMEN CT WITH CONTRAST:

CLINICAL DATA: Right lower quadrant pain with diarrhea and nausea.
TECHNIQUE: Multidetector CT imaging of the abdomen and pelvis was performed
following the standard protocol during bolus administration of intravenous
contrast.

Contrast:  100 cc Omnipaque 300

[Series 3: routine abdomen · axial · 0.59mm/px · z∈[-416,-82]mm · 13 of 77 slices shown, 15 images]
[im 5/77  soft-tissue]
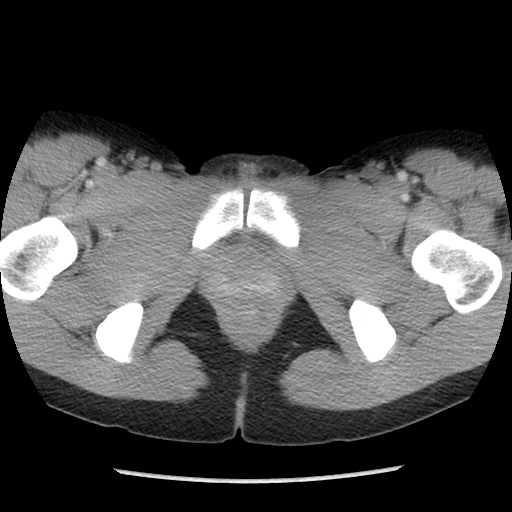
[im 5/77  bone]
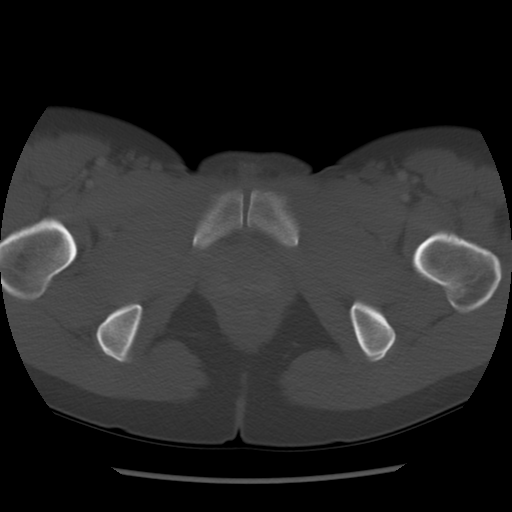
[im 9/77  soft-tissue]
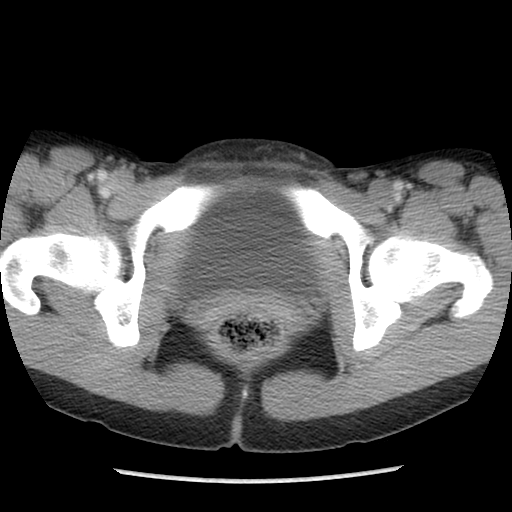
[im 17/77  soft-tissue]
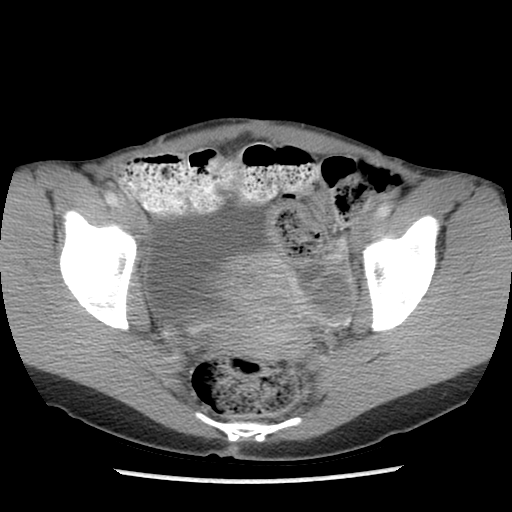
[im 22/77  soft-tissue]
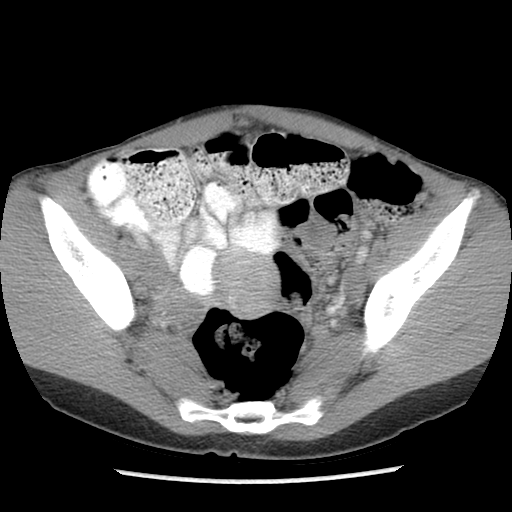
[im 26/77  soft-tissue]
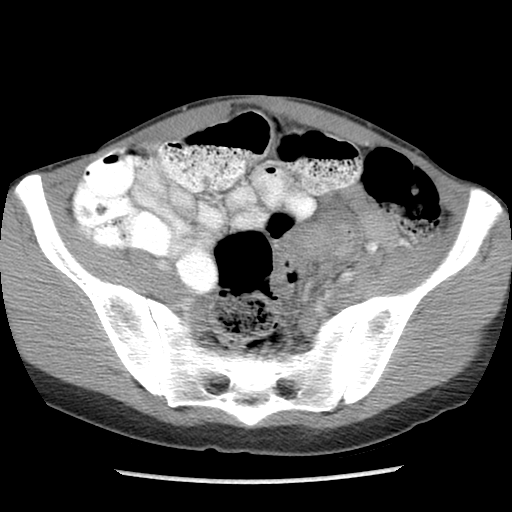
[im 34/77  soft-tissue]
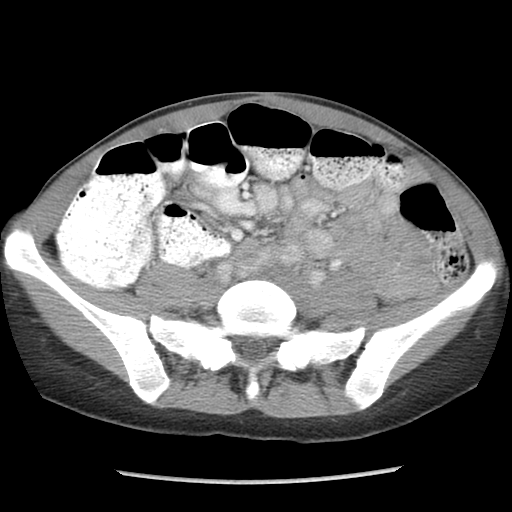
[im 39/77  soft-tissue]
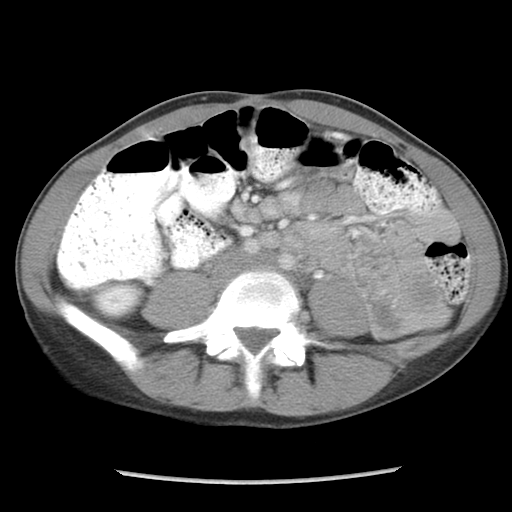
[im 43/77  soft-tissue]
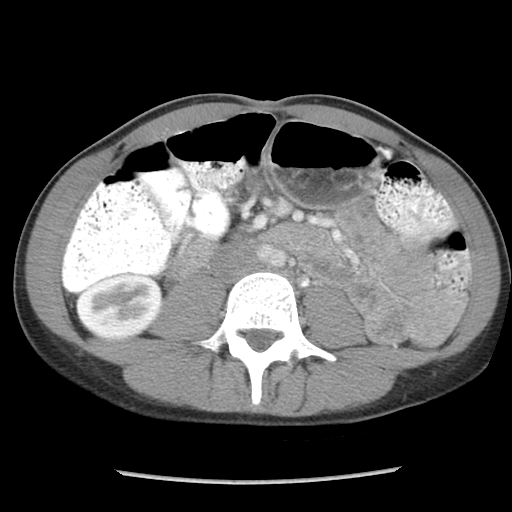
[im 51/77  soft-tissue]
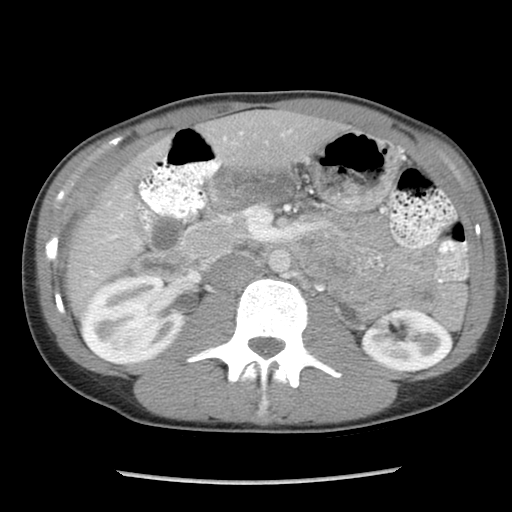
[im 51/77  bone]
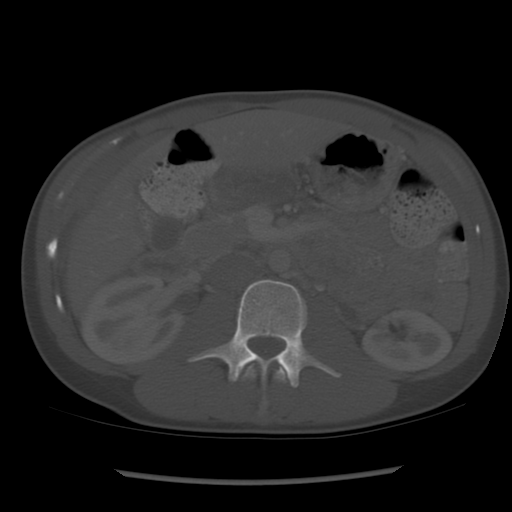
[im 55/77  soft-tissue]
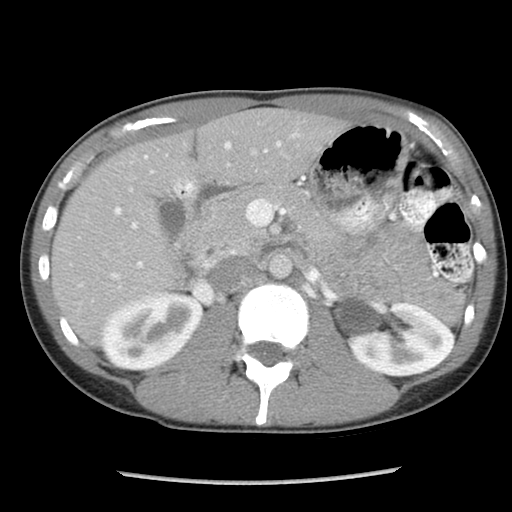
[im 60/77  soft-tissue]
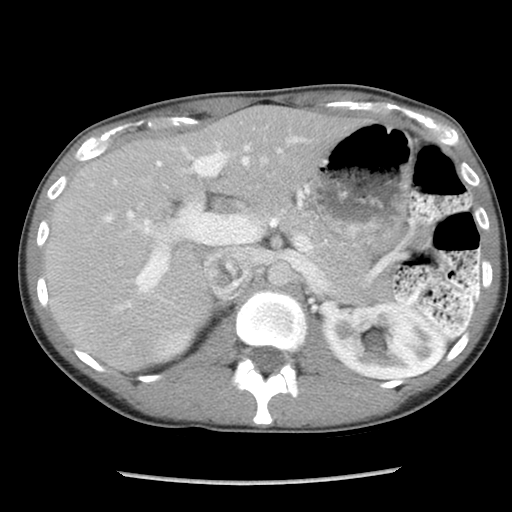
[im 68/77  soft-tissue]
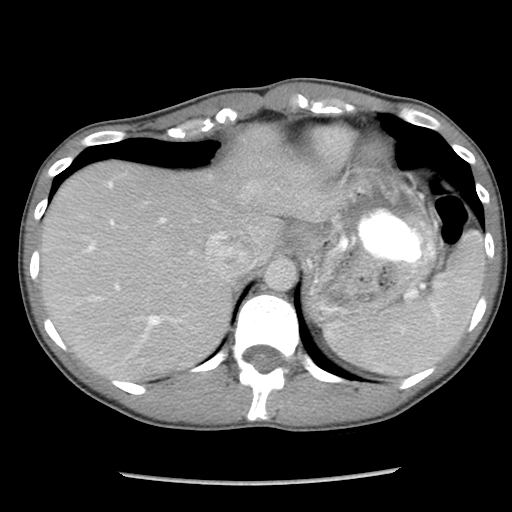
[im 72/77  soft-tissue]
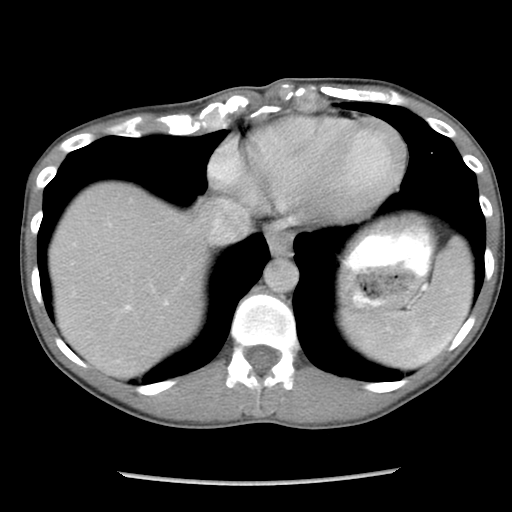

[Series 602: sagittal body · sagittal · 0.76mm/px · 3 of 121 slices shown]
[im 41/121  soft-tissue]
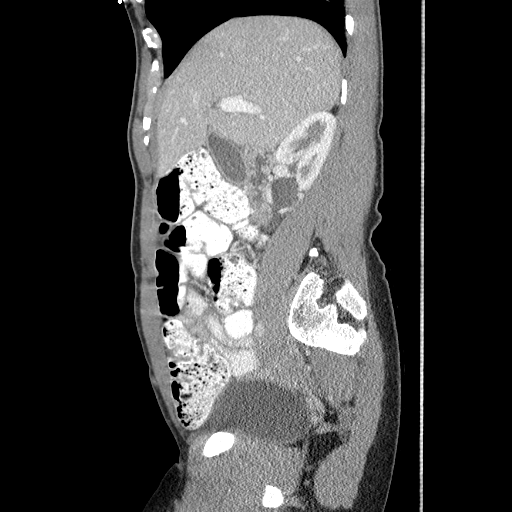
[im 54/121  soft-tissue]
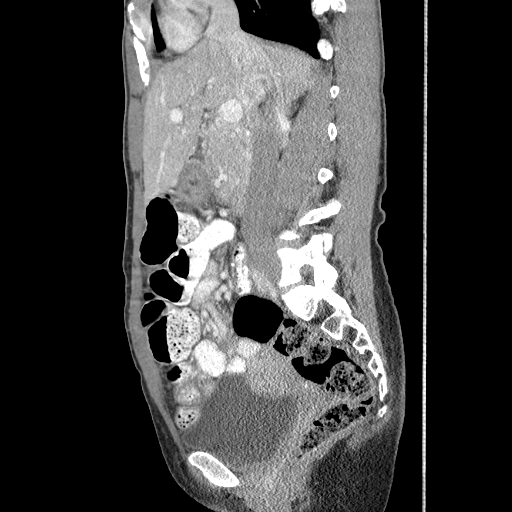
[im 67/121  soft-tissue]
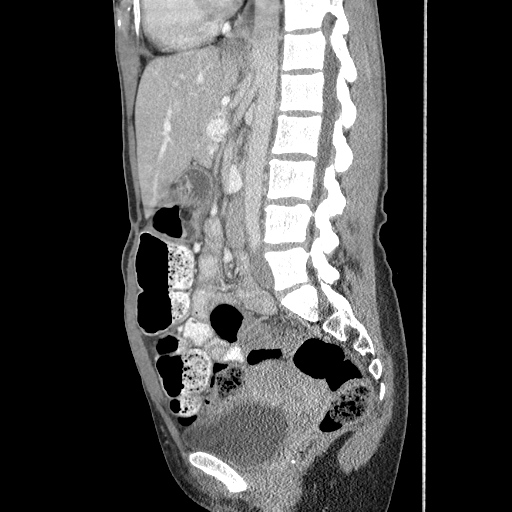

[16 of 46 positions shown; findings below may reference images not displayed]

FINDINGS: Right middle lobe calcified granuloma. No focal abnormality in the
liver or spleen. The stomach, duodenum, pancreas, gallbladder, adrenal glands,
and kidneys are unremarkable. The patient does have prominent extrarenal pelves
bilaterally.

The superior mesenteric vein and portal vein are prominent. The main portal vein
measures 1.9 cm in diameter. No evidence for retroperitoneal lymphadenopathy. No
intraperitoneal free fluid.

The patient has a large amount of stool in the abdominal segments of the colon.
IMPRESSION: A large volume of stool in colon raises the question of constipation.

PELVIS CT WITH CONTRAST:
FINDINGS: 2.6 x 3.8 cm complex cystic process noted in the left adnexal space.
1.7 cm cyst in the right adnexa region is probably a dominant follicle in the
right ovary. The bladder is mildly distended. There is no intraperitoneal free
fluid. There is an area of soft tissue fullness in the region of the sigmoid
colon which is probably related to peristalsis although there is so little
intrapelvic fat, this area of bowel cannot be discretely discerned. Flexible
sigmoidoscopy or barium enema could be used to assess for a sigmoid lesion as
clinically warranted.

As in the abdomen, there is a large volume of stool in the pelvic segments of
the colon. The ileocecal valve is difficult to identify, but I think it is on
image 50 of the sagittal reformations. No wall thickening or perienteric edema
is seen at the terminal ileum.

Bone windows show no sclerosis at the sacroiliac joints.
IMPRESSION: A large volume of stool throughout the length of the colon suggest constipation.

3.8 cm complex left adnexal cyst. Pelvic ultrasound recommended to further
evaluate.

Soft tissue fullness in the mid sigmoid colon is probably related to
peristalsis, but a focal mass cannot be excluded. Correlation for change in
bowel habits is recommended. Flexible sigmoidoscopy or barium enema could be
used to further bodyweight as clinically warranted.

## 2007-07-22 HISTORY — PX: ABDOMINAL HYSTERECTOMY: SHX81

## 2007-12-30 ENCOUNTER — Ambulatory Visit: Admission: RE | Admit: 2007-12-30 | Discharge: 2007-12-30 | Payer: Self-pay | Admitting: Internal Medicine

## 2008-03-20 ENCOUNTER — Other Ambulatory Visit: Admission: RE | Admit: 2008-03-20 | Discharge: 2008-03-20 | Payer: Self-pay | Admitting: Internal Medicine

## 2008-03-21 ENCOUNTER — Encounter: Admission: RE | Admit: 2008-03-21 | Discharge: 2008-03-21 | Payer: Self-pay | Admitting: Internal Medicine

## 2008-03-21 IMAGING — MG MM SCREEN MAMMOGRAM BILATERAL
4 series · 4 of 4 positions shown · non-contrast
Comparison: Prior studies.

DG SCREEN MAMMOGRAM BILATERAL
Bilateral CC and MLO view(s) were taken.
Prior study comparison: [DATE], bilateral screening mammogram, performed at the [REDACTED] at the [REDACTED].

DIGITAL SCREENING MAMMOGRAM WITH CAD:

[R CC]
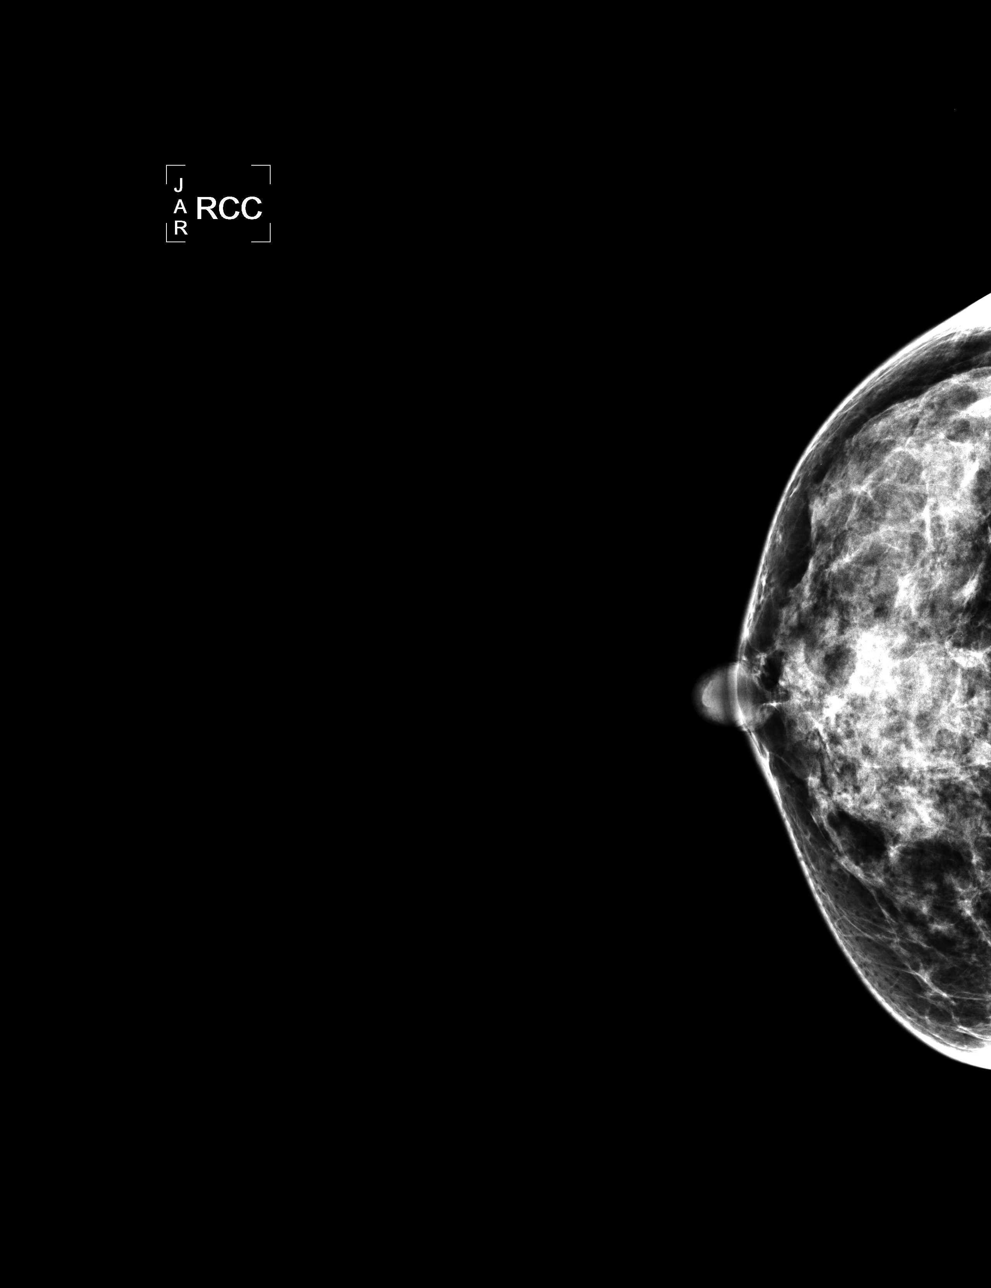

[L CC]
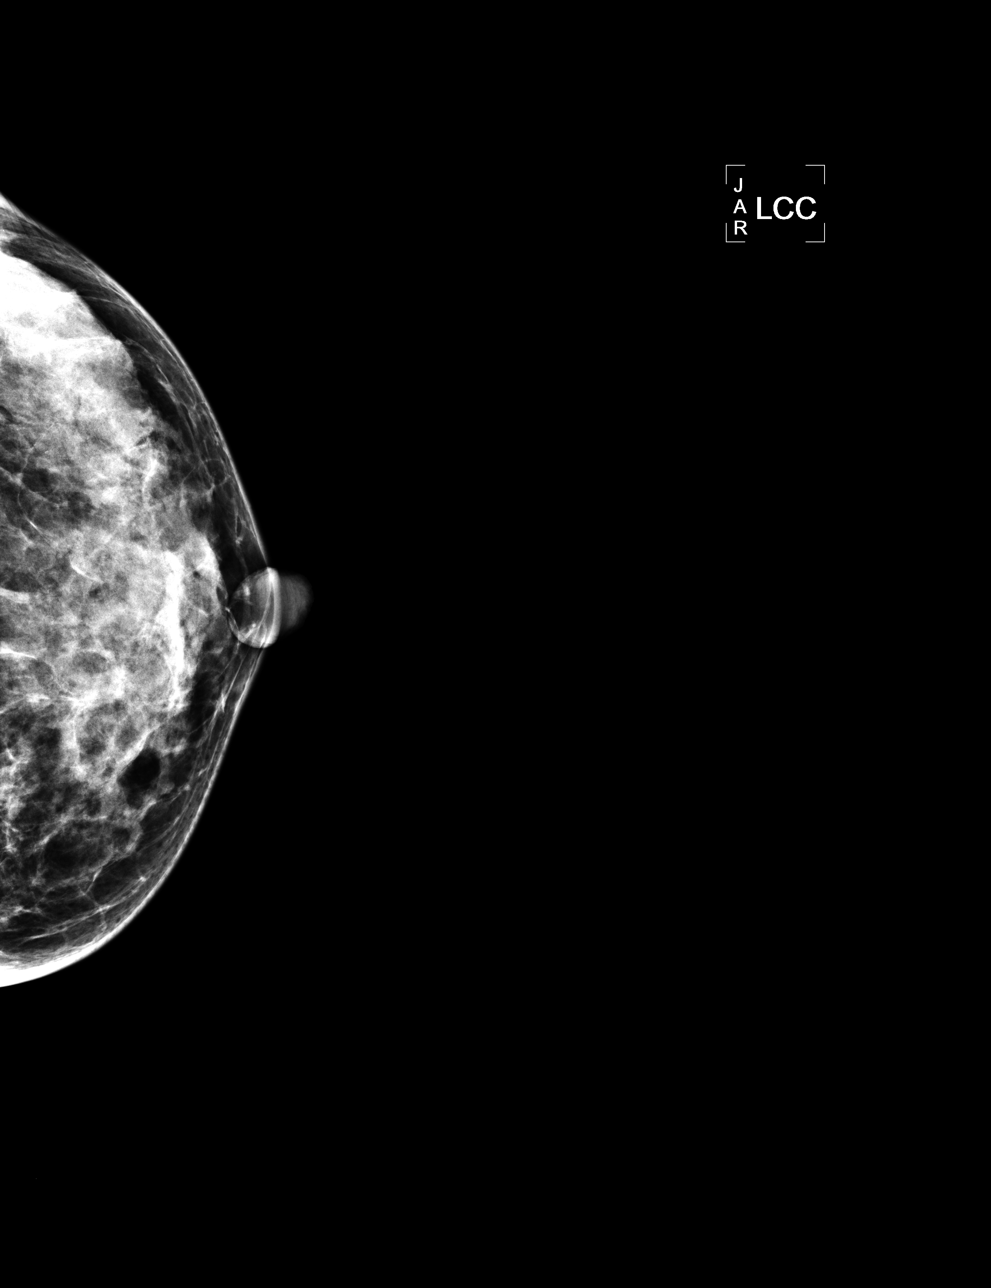

[L MLO]
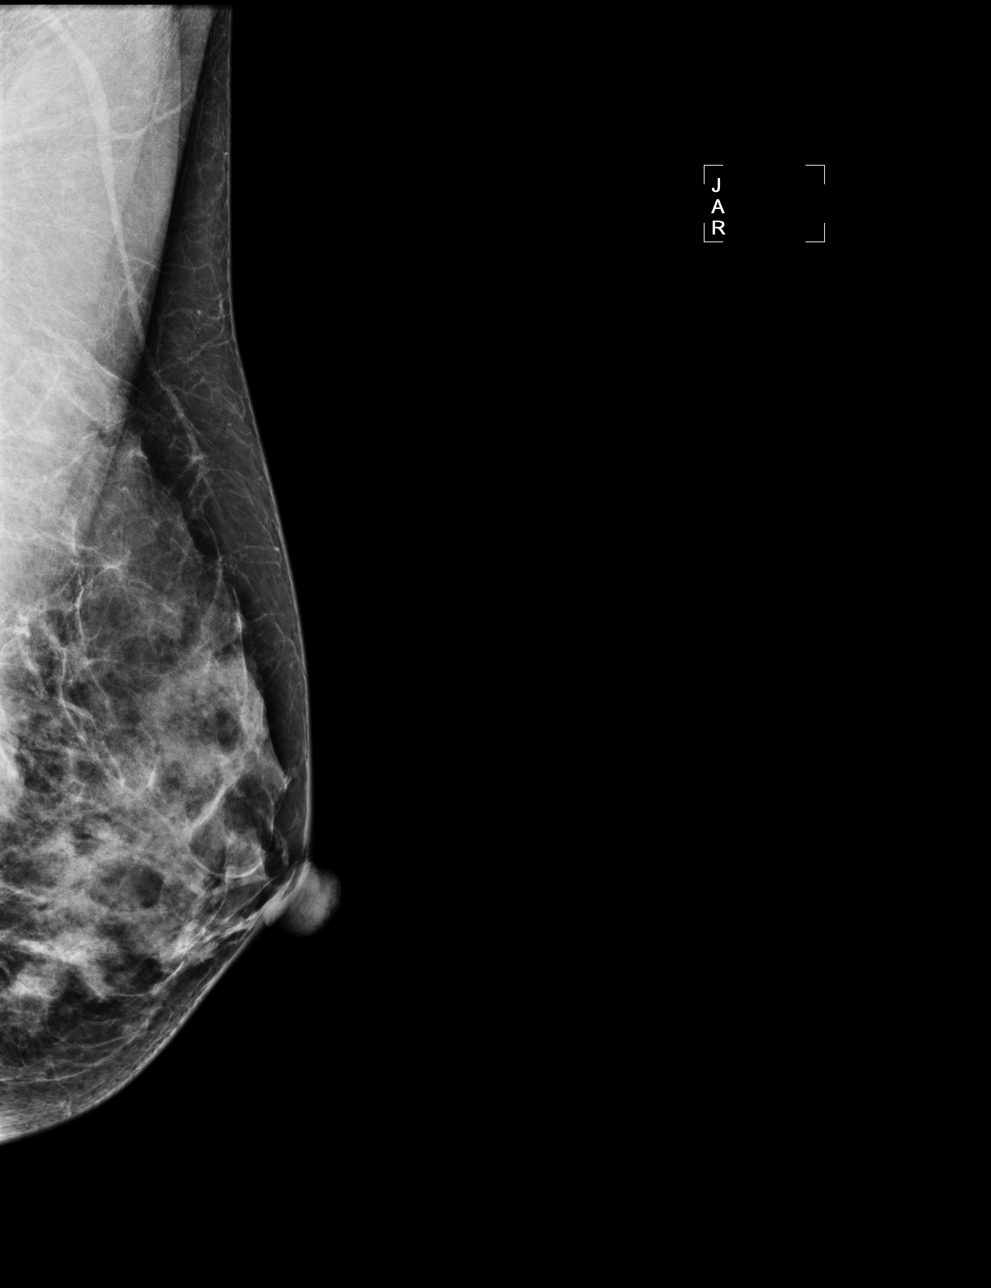

[R MLO]
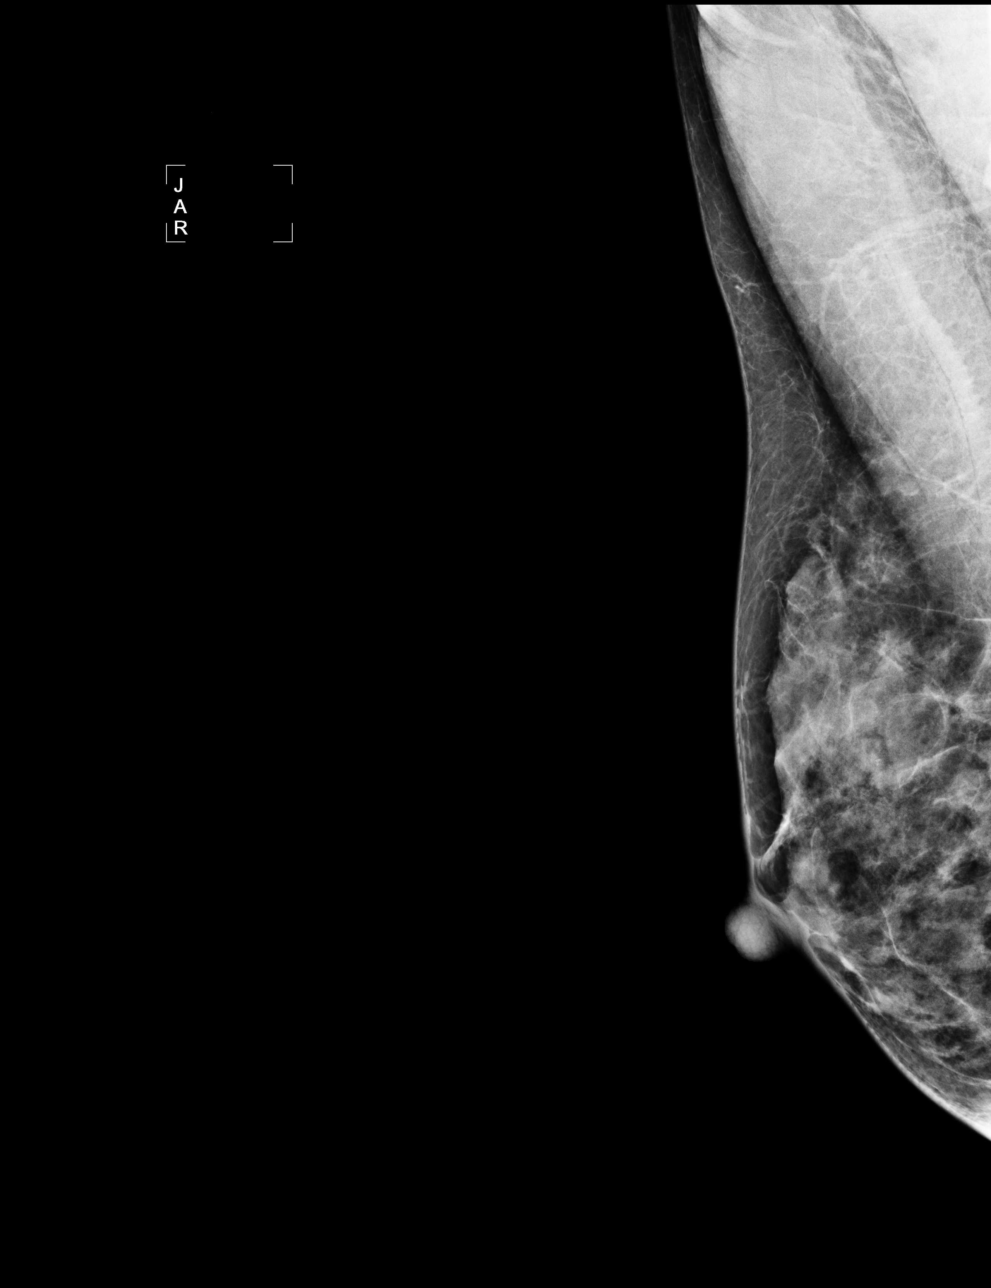

[4 of 4 positions shown; findings below may reference images not displayed]

The breast tissue is extremely dense.  There is no dominant mass, architectural distortion or 
calcification to suggest malignancy.
IMPRESSION: No mammographic evidence of malignancy.  Suggest yearly screening mammography.

ASSESSMENT: Negative - BI-RADS 1

Screening mammogram in 1 year.
ANALYZED BY COMPUTER AIDED DETECTION. , THIS PROCEDURE WAS A DIGITAL MAMMOGRAM.

## 2009-05-23 ENCOUNTER — Encounter: Admission: RE | Admit: 2009-05-23 | Discharge: 2009-05-23 | Payer: Self-pay | Admitting: Internal Medicine

## 2010-07-31 ENCOUNTER — Encounter
Admission: RE | Admit: 2010-07-31 | Discharge: 2010-07-31 | Payer: Self-pay | Source: Home / Self Care | Attending: Internal Medicine | Admitting: Internal Medicine

## 2010-07-31 IMAGING — MG MM DIGITAL DIAGNOSTIC BILAT
4 series · 4 of 4 positions shown · non-contrast
Comparison: [DATE], [DATE]

CLINICAL DATA: The patient has been having tenderness in the left
upper outer quadrant.  Her physician feels a nodule in the left
upper outer quadrant.  Her mother had breast cancer at age 70.  Her
maternal aunt had breast cancer at age 81.

DIGITAL DIAGNOSTIC BILATERAL MAMMOGRAM WITH CAD AND LEFT BREAST
ULTRASOUND:

[R CC]
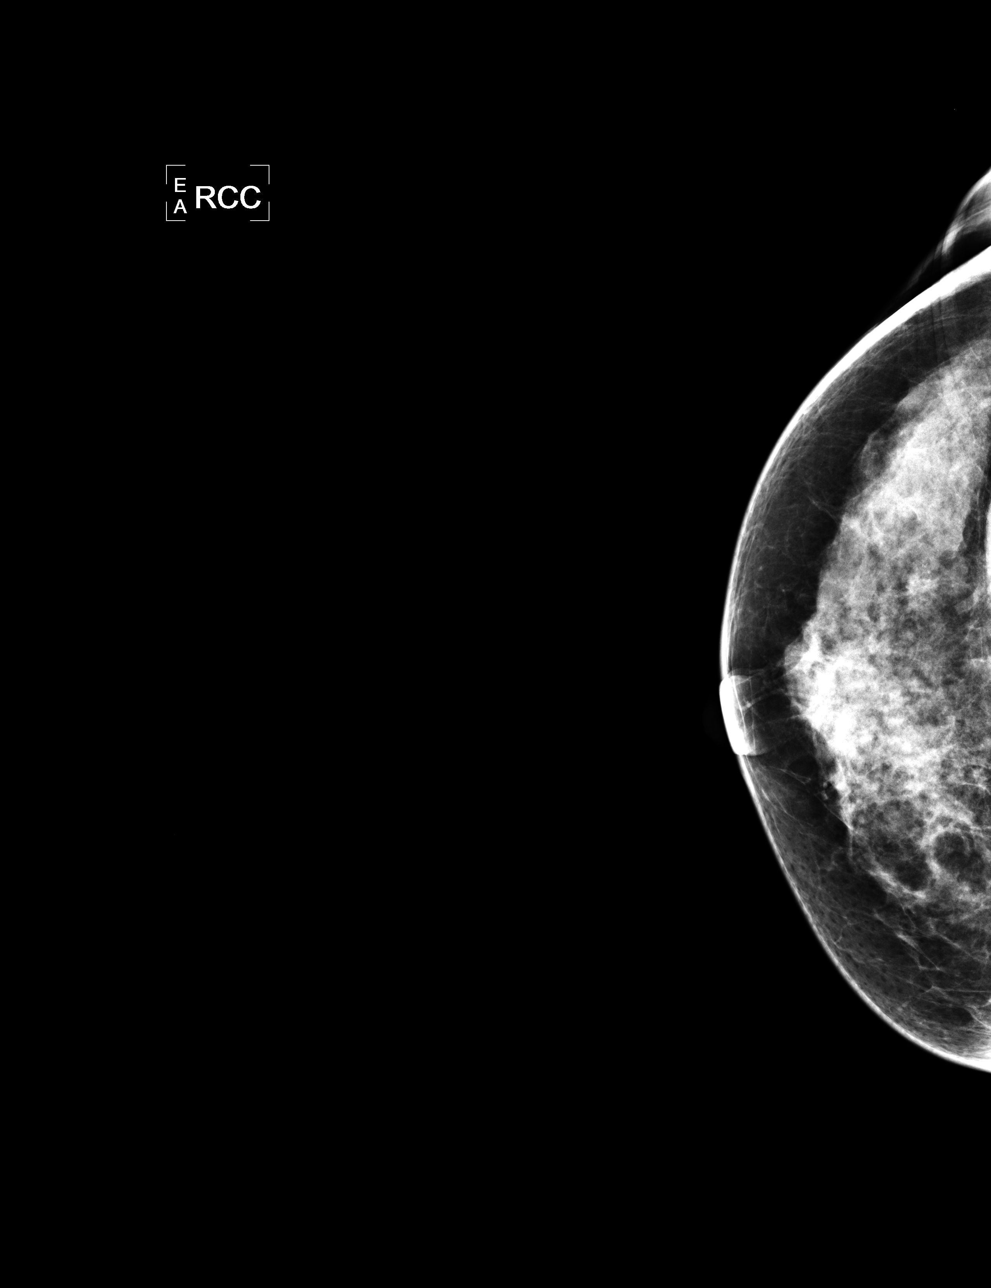

[L CC]
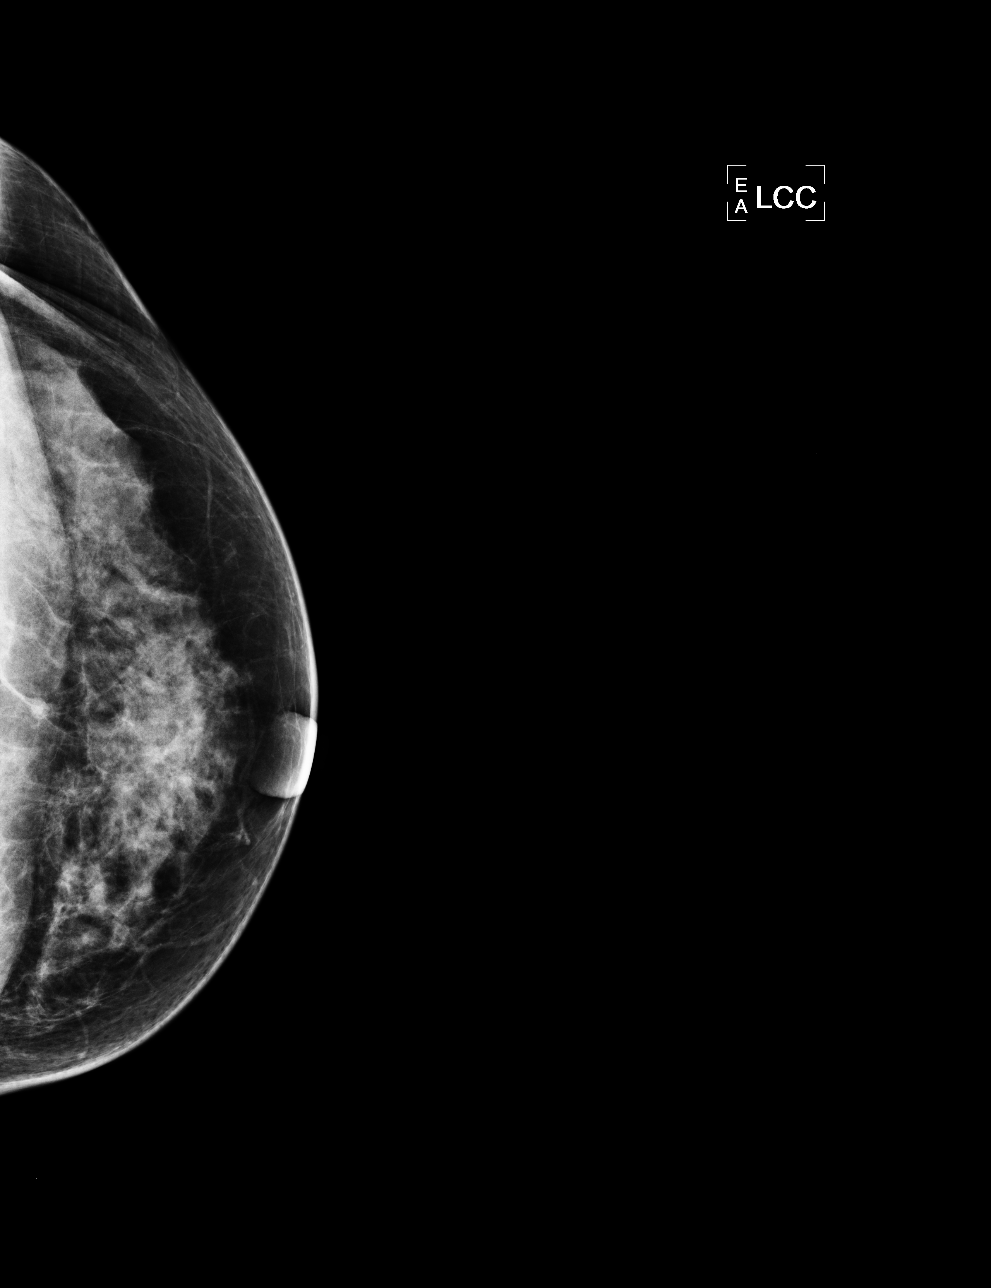

[L MLO]
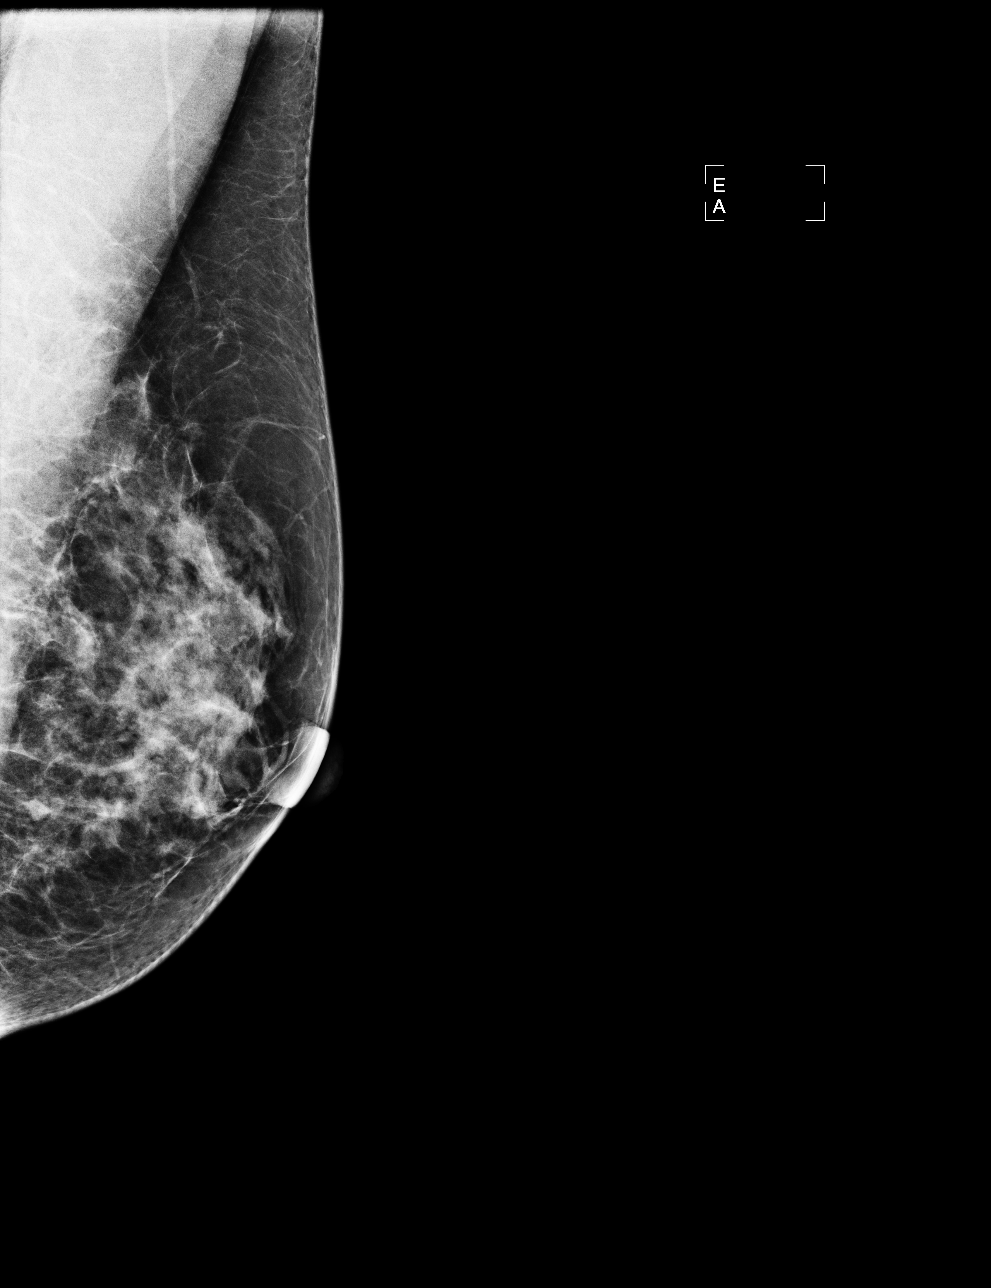

[R MLO]
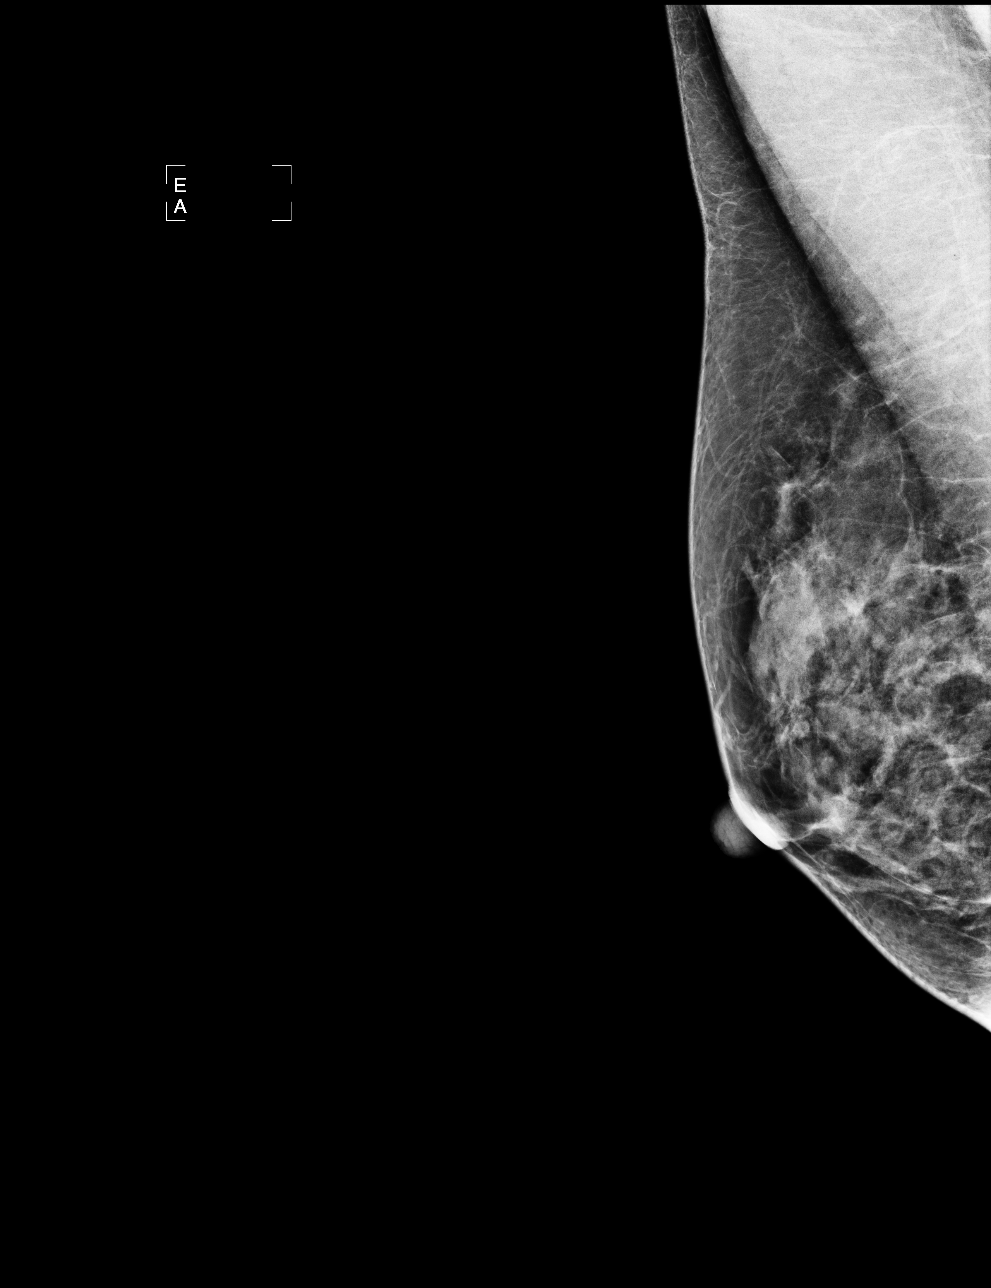

[4 of 4 positions shown; findings below may reference images not displayed]

FINDINGS: The breast tissue is heterogeneously dense.  There is no
suspicious dominant mass, architectural distortion or calcification
to suggest malignancy.
Mammographic images were processed with CAD.

On physical exam, no mass is palpated in the left upper outer
quadrant.  There is focal tenderness at 1 o'clock 4 cm from the
left nipple.

Ultrasound is performed, showing a nonpalpable 4 x 3 x 3 mm cyst at
1 o'clock 4 cm from the left nipple in the area of tenderness.
There is also a nonpalpable cyst at 6 o'clock 3 cm from the left
nipple measuring 4 x 3 x 3 mm.  No solid mass, distortion or
shadowing to suggest malignancy is identified.
IMPRESSION: No mammographic or sonographic evidence of malignancy.  Left
breast cysts.  Yearly screening mammography is suggested.

BI-RADS CATEGORY 2:  Benign finding(s).

## 2010-07-31 IMAGING — US US BREAST L
1 series · 8 of 8 positions shown · non-contrast
Comparison: [DATE], [DATE]

CLINICAL DATA: The patient has been having tenderness in the left
upper outer quadrant.  Her physician feels a nodule in the left
upper outer quadrant.  Her mother had breast cancer at age 70.  Her
maternal aunt had breast cancer at age 81.

DIGITAL DIAGNOSTIC BILATERAL MAMMOGRAM WITH CAD AND LEFT BREAST
ULTRASOUND:

[Series 1: us breast left · 8 of 8 slices shown]
[im 1/8]
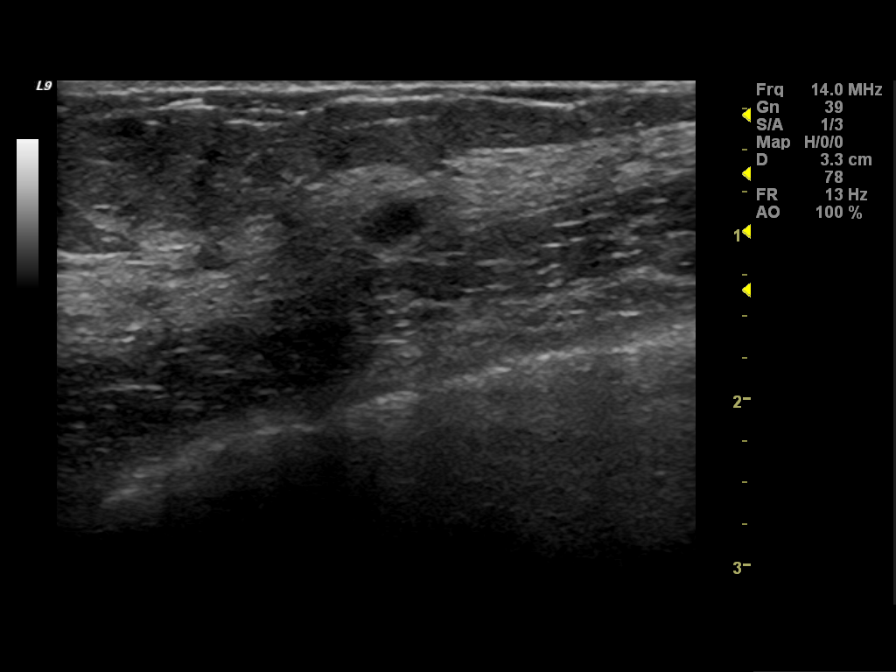
[im 2/8]
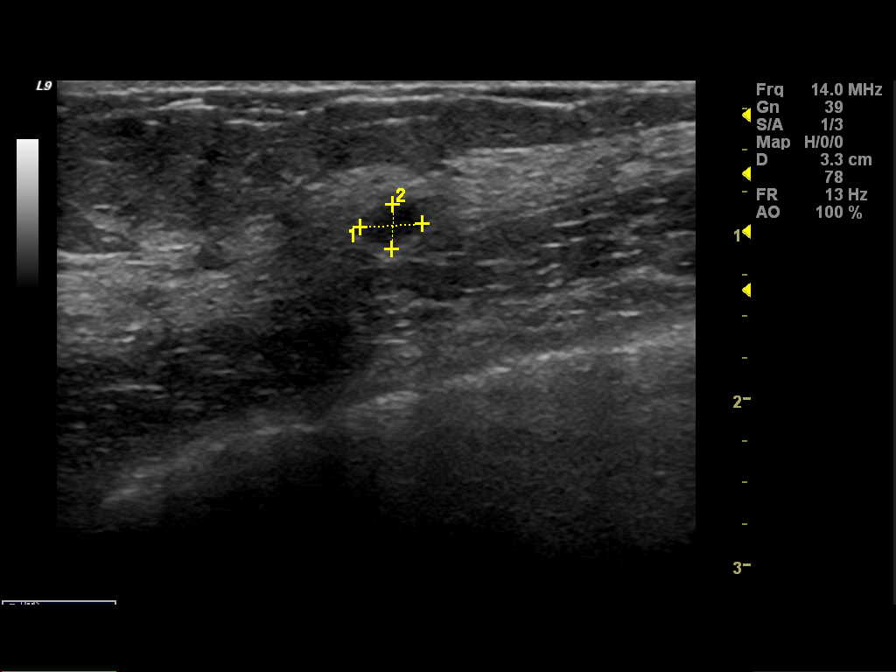
[im 3/8]
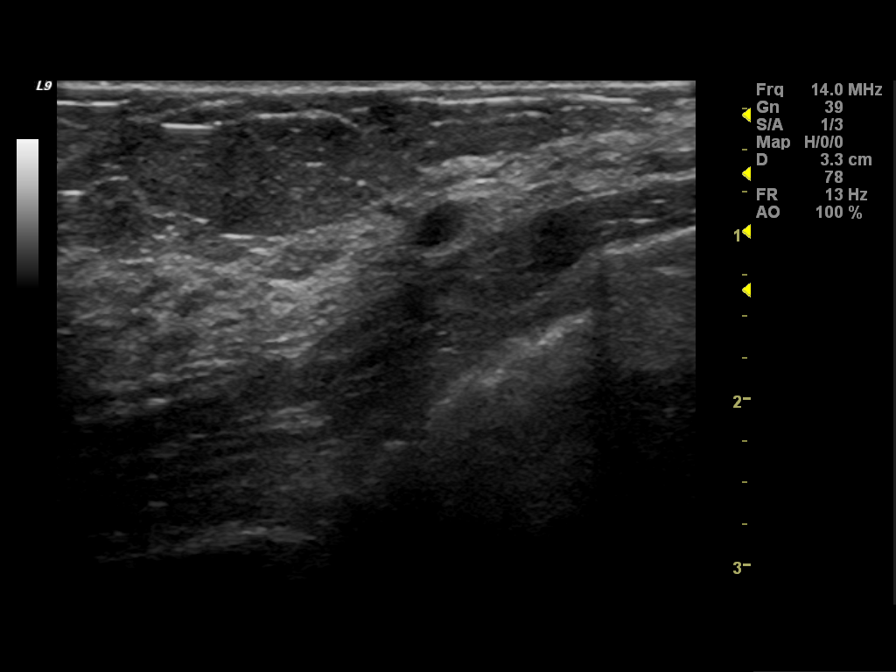
[im 4/8]
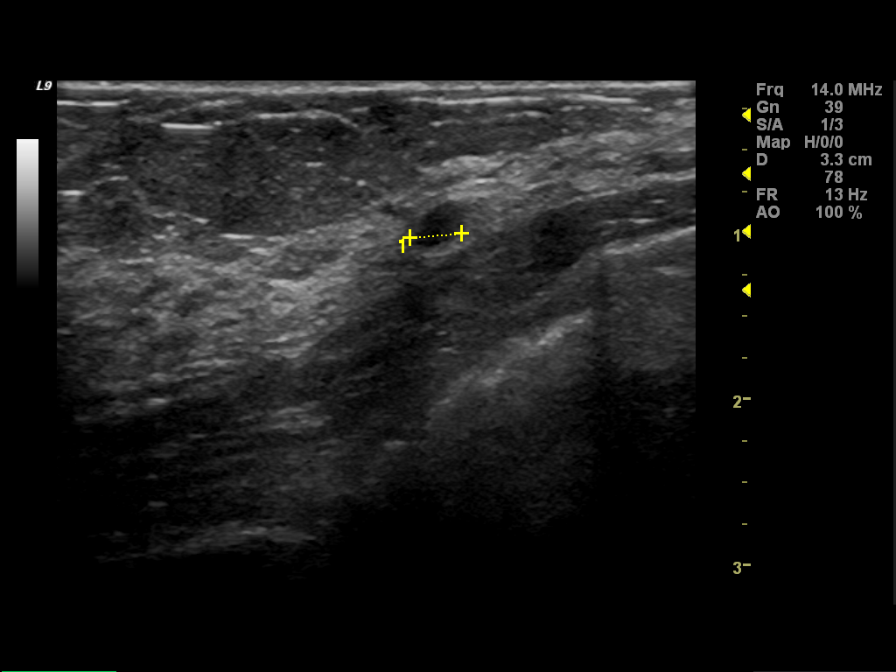
[im 5/8]
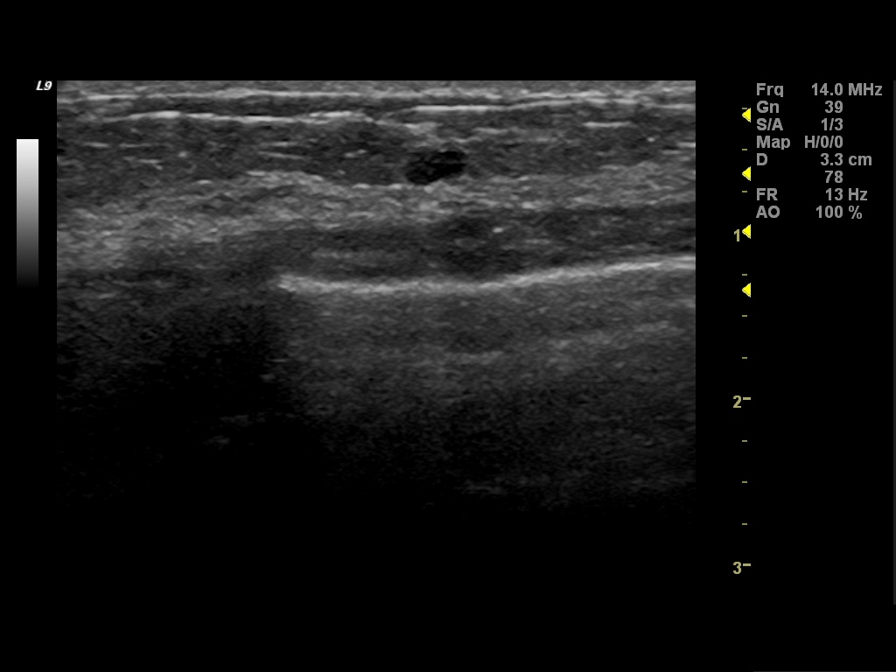
[im 6/8]
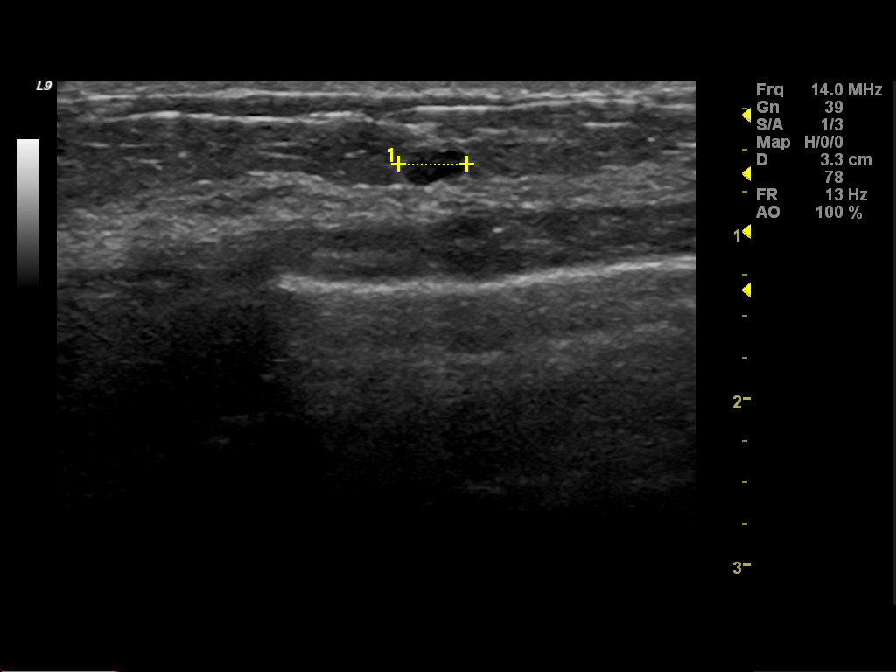
[im 7/8]
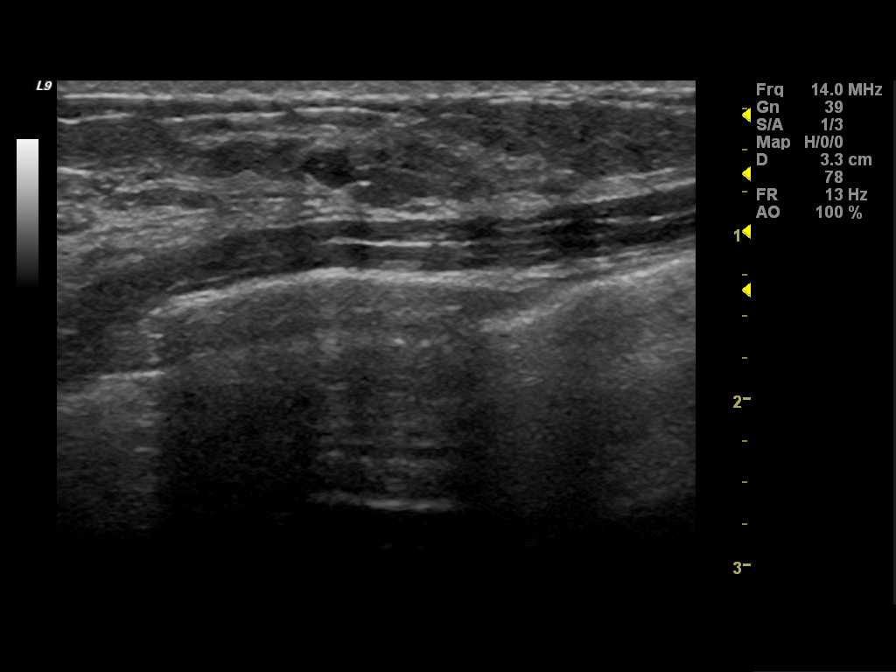
[im 8/8]
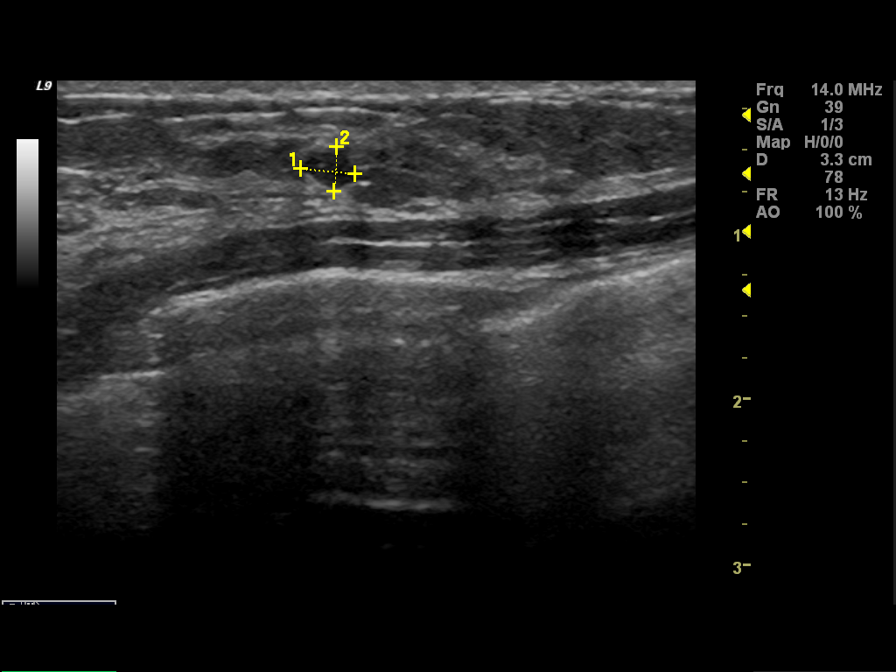

[8 of 8 positions shown; findings below may reference images not displayed]

FINDINGS: The breast tissue is heterogeneously dense.  There is no
suspicious dominant mass, architectural distortion or calcification
to suggest malignancy.
Mammographic images were processed with CAD.

On physical exam, no mass is palpated in the left upper outer
quadrant.  There is focal tenderness at 1 o'clock 4 cm from the
left nipple.

Ultrasound is performed, showing a nonpalpable 4 x 3 x 3 mm cyst at
1 o'clock 4 cm from the left nipple in the area of tenderness.
There is also a nonpalpable cyst at 6 o'clock 3 cm from the left
nipple measuring 4 x 3 x 3 mm.  No solid mass, distortion or
shadowing to suggest malignancy is identified.
IMPRESSION: No mammographic or sonographic evidence of malignancy.  Left
breast cysts.  Yearly screening mammography is suggested.

BI-RADS CATEGORY 2:  Benign finding(s).

## 2010-08-10 ENCOUNTER — Encounter: Payer: Self-pay | Admitting: Obstetrics and Gynecology

## 2010-12-06 NOTE — Op Note (Signed)
[name], [name]            ACCOUNT NO.:  [id number]   MEDICAL RECORD NO.:  [id number]          PATIENT TYPE:  INP   LOCATION:  0481                         FACILITY:  [facility]   PHYSICIAN:  Anselm Pancoast. 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   POSTOPERATIVE DIAGNOSIS:  Infarction of the short segment of ileum, probably  a twist that spontaneously reduced.   OPERATION:  Exploratory laparotomy, resection of approximately a foot and a  half of terminal ileum and anastomosis side to side cecum.   ANESTHESIA:  General anesthesia.   SURGEON:  Anselm Pancoast. Zachery Dakins, M.D.   ASSISTANT:  Leonie Man, M.D.   HISTORY:  [name] is a 45 year old female who 2 months ago was  admitted through the emergency room with closed loop obstruction that  occurred 4 months after a laparotomy done at [facility] where she had a  short segment of terminal ileum removed, supposedly involved endometriosis.  At the time of the surgery that I performed 2 months ago, she had basically  a closed loop of short segment of terminal ileum that had infarcted up to  about an inch of the terminal ileum where it had been implanted in the cecum  where her appendix had been previously removed. I had closed the mesentery,  placed the bowel in what I was thought normal anatomical position, and she  did nicely until approximately 2 months after her surgery. I had seen her in  the office about 4 or 5 days earlier and was back by Friday to normal  activities. Had a sudden onset of severe crampy abdominal pain and nausea  and vomiting and presented to the emergency room. At this time, she was seen  by the ER physician who did associate that this possibly could be related to  the previous surgery, and  [facility] was asked to see her. They put her on  antibiotics, pain medication, and obtained a CT. The CT showed thickened  loop of small bowel in the distal small bowel, not really a definite  obstruction, and she was on PCA Dilaudid, and I did see her the next  morning. On questioning, I told her that if she was not significantly  improved promptly, her white count was not significantly elevated, that she  would probably need exploratory laparotomy. We saw her about 3 hours later,  and she was feeling better, and on listening to her abdomen, she did have  bowel sounds and was certainly less tender than she had been when I seen her  approximately 8:00 on the day of admission. I came back and reexamined again  that evening, and she was supposedly feeling less and less pain; was,  however, receiving IV Dilaudid, and her white count that had been 14,000  originally was 15,000. I notified Dr. Gerrit Friends who would be on call for the  weekend about her condition and that she might need a laparotomy,  and the  following morning, she did have a markedly elevated white count, but the  patient stated that she was feeling better. He obtained a lactic acid level  and flat and upright abdominal films, and white count was elevated. He  thought clinically she was doing better and did not elect to operate on her.  I saw her yesterday on Sunday, and she was still kind of bloated, cramping,  had not vomited over a period of basically 2 days. I obtained an unprepped  CT of the pelvis, and now where the thickened loop of small bowel was not as  impressive, but the proximal small bowel was definitely dilated and fluid  filled. I discussed with her that most likely she would need a repeat  laparotomy. She wanted to wait and see if she was better today. Basically,  she was more distended, not better, and agreed to proceed with surgery. She  has been on broad antibiotic coverage. Her white count today is 13,000, and   she has had adequate hydration.   The patient was taken to surgery. Induction of general anesthesia. Foley  catheter and G tube to the stomach, and the abdomen prepped with Betadine  scrub solution. She has __________ stockings. I opened up the old midline  incision. On carefully opening to the peritoneal cavity, small bowel was  very dilated, and basically, there were essentially no adhesions in the  right lower quadrant. The most distal loop of terminal ileum was frankly  necrotic, and she has probably had a little adhesion that actually had  popped, as best I can tell. I do not see any reason where the bowel had  caught itself in internal hernia would be the thing to actually cause this.  It must had been an adhesion that caused this, or the bowel twisted and  untwisted. We elected to use a TA60 to actually close the cecum where the  previous implanting of the terminal ileum had been done, and then later  folded in with Lembert sutures of 3-0 silk to this area. Next, the little  mesenteric about foot and a half of frankly necrotic small bowel was divided  between [facility], and then we got up to the viable area, and then a Poole  sucker was placed in the inter mesenteric surface and sucked out probably a  good liter of succus entericus. I then transected this with GIA just  proximal to where I made the enterotomy for the [facility] sucker, and removed  the specimen from the field. I elected this instead of doing it in an end to  end hand sewn anastomosis that I had done before to kind of do the  anastomosis in a functioning side to side laying medially on the tenia, so I  think it is less likely to twist since she has infarcted this twice with  twisting. There was some stool in her colon that was firm, and the little  mesenteric defect layer was closed with interrupted 3-0 silk sutures. I used a GIA 55 to put the ends of the anvil starting from the distal cecum and  also the distal ileum and  then fired it and then closed the little  enterotomy with 2 layers of 3-0 Vicryl and the inner layer running and then  3-0 silk Lembert sutures on the outside layer. The anastomosis is 2 to 3  fingerbreadths, and the bowel is lying without excessive tension. We had  sucked out the serous bloody fluid, ran  the small bowel. There is basically  no adhesions. She has got a little small fibroid on the uterus and washed  the peritoneal cavity generally. Hopefully, most of the fluid has been  aspirated and then the transverse colon was kind of brought down over the  small bowel. She has got very little omentum. She is very thin. Then closed  the midline incision with a #1 PDS using __________. The skin was closed  with staples. The old incision had been reopened. I did take this one up  probably about 3/4 inch higher to the right side of the umbilicus to get  just a little better exposure since we were doing the anastomosis a little  higher on the cecum than previously. The patient tolerated the surgery. NG  tube was in good position, and we will keep on antibiotics for approximately  24 hours and then keep the NG tube until she actually has bowel function.      WJW/MEDQ  D:  06/10/2004  T:  06/11/2004  Job:  [id number]

## 2010-12-06 NOTE — Discharge Summary (Signed)
[name], [name]            ACCOUNT NO.:  [id number]   MEDICAL RECORD NO.:  [id number]          PATIENT TYPE:  INP   LOCATION:  0379                         FACILITY:  [facility]   PHYSICIAN:  Anselm Pancoast. 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   OPERATION:  Exploratory laparotomy, resection of small bowel and lysis of  adhesions with ileocecal anastomosis.   HISTORY:  Ms. [name] is a 45 year old female who stated she was doing  satisfactory until approximately 24 hours prior to admission when she  started having severe lower abdominal pain.  She said this became very  intense.  She called a friend, and the friend evaluated, and they elected to  bring her in by ambulance.  She arrived here about 11:30 p.m.  She was seen  by Dr. Effie Shy who started her on pain medication and Tagamet and obtained a  CT.  The CT was performed and looked like a closed-loop obstruction.  I was  contacted approximately 5:30 and came in and saw her promptly.   PAST MEDICAL HISTORY:  Her past history is significant in that she has had  an exploratory laparotomy with resection of a small segment of terminal  ileum approximately four or five months ago in [facility], whether it was  endometriosis or appendicitis, I was never definitely sure, but she said she  had done satisfactory until the episode of pain the recent day.   PHYSICAL EXAMINATION:  GENERAL:  On exam, she was definitely tender in the  lower abdomen and had a marked leukocytosis of 18,800.  She was taken  directly to surgery.  An IV had been previously started.  She was added on  Unasyn.  In surgery, she had an infarcted closed loop, approximately about  three feet in length, and toward the terminal ileum, she had twisted around  an  adhesion to the lower aspect of the small midline abdominal incision.  It  was necessary to resect this.  I basically disconnected where the previous  anastomosis had been since it was only about one inch of terminal ileum  still viable, and reimplanted the now mid ileum into the cecum with open  anastomosis.   Postoperatively, she had a nasogastric tube and started having flatus.  Approximately two days after surgery, the NG tube was removed, and she was  started on a liquid diet which was tolerated, and she was ready for  discharge in improved condition on about the fourth postoperative morning.   Her laboratory studies postoperatively showed the white count back down to  normal at 9800, on the 2nd postoperative morning, and the pathology report  showed ischemic changes with mesenteric tissue to the distal area showed  foreign body giant cell reaction, probably suture material, and that was  from the previous surgery.   The patient was discharged with instruction to continue trying to advance  her diet over the next  two days, and to see me in the office for a follow-up  appointment in approximately five to six days.  She has Vicodin for pain and  the incision was healing nicely at the time of discharge.      WJW/MEDQ  D:  05/09/2004  T:  05/09/2004  Job:  [id number]

## 2010-12-06 NOTE — Discharge Summary (Signed)
NAMEKUMBA, DIEGO            ACCOUNT NO.:  [id number]   MEDICAL RECORD NO.:  [id number]          PATIENT TYPE:  INP   LOCATION:  0481                         FACILITY:  [facility]   PHYSICIAN:  Anselm Pancoast. Weatherly, M.D.DATE OF BIRTH:  11/09/1965   DATE OF ADMISSION:  06/07/2004  DATE OF DISCHARGE:  06/21/2004                                 DISCHARGE SUMMARY   DISCHARGING DIAGNOSIS:  Infarction short segment terminal ileum.   OPERATION:  Exploratory laparotomy, resection of segment of terminal ileum,  and ileocecal anastomosis.   HISTORY:  [name] is a 45 year old female who is only approximately  a month following a discharge from [facility] where she got basically an  adhesion with a segment of terminal ileum that became infarcted after it  twisted around adhesion where she had had a previous limited resection of  the terminal ileum approximately 4 or 5 months earlier in [facility].  She  had a past history of endometriosis, no history of Crohn's disease, and  there was no other pathology of the excised specimen besides an infraction,  and she had a single adhesion in the lower abdomen and I resected this  approximately 2-foot segment and then reimplanted the terminal ileum into  the cecum.  An appendectomy had been previously done and I had seen her in  the office and she has done satisfactorily with intermittent diarrhea.  She  has a past history of depression and endometriosis and this time started  having severe cramping abdominal pain of approximately a day's duration and  presented to the emergency room and the ER physician saw her and I think  that she had plain abdominal films that were not impressive, and she was  admitted to the hospitalist service.  The following morning I was notified  of her admission and came by and saw her and she stated that her pain was a  lot less than it had been previously.  The CT that had been performed  certainly did not show a  definite obstruction.  It did show some thickened  loops of small bowel in the terminal ileum area and I was certainly  concerned that this was possibly a reoccurring problem similar to her  previous problem.  She was admitted I think on a Friday and I saw her again  Friday afternoon and I was not on call that weekend, and over the weekend  she was seen by Dr. Gerrit Friends on Saturday.  He had also seen her on Friday  evening and Saturday was seen, I think by Dr. Johna Sheriff.  She appeared to be  getting better.  Dr. Gerrit Friends actually had done some lactic acid levels and  she was certainly still kind of vaguely tender in the right lower quadrant  but stated she was a lot less tender than she had been when this had  occurred on Thursday.  On Monday I saw her again and she was still  significantly tender in my opinion over her lower abdomen and her white  blood count that had been normal previously was now 23,000.  I got a repeat  CT.  She still had this thickened loop of terminal ileum.  Repeat white  count was now 13,000 but because of the tenderness, the pain, the  abnormality repeated on CT, I recommended that we proceed on with laparotomy  and the patient was in agreement.  At surgery, and Dr. Lurene Shadow assisted, she  did not have actual adhesion.  I think that she must have had an adhesion  that had broken, but there was about a 12-inch segment of frankly infarcted  terminal ileum and what looked as if there had been a point of obstruction  that was not there at the time we operated, and we resected this area and  then I brought the terminal ileum this time into the side of the cecum  medially so it is kind of more of the typical location of the ileum.  She  had basically again no adhesions in the abdomen at all and I think this with  the floppiness of her intestines is what is kind of contributing to this  area.  On pathology exam, there was no evidence of any arterial thrombosis  and she has no  reasons to have a high incidence of arterial emboli.  Postoperatively, she had a nasogastric tube.  It was about a week before she  really started having bowel function.  She was significantly dilated on  proximal _________, this time about a 3 day delay before she had proceed  with surgery where the previous time I had operated on her she had gone  directly from the ER to the OR.  Postoperatively, she started having bowel  function again had looser bowel movements but white count was normal.  The  IV was decreased off and she was ready for discharge.  I am going to  discharge her on Flagyl 250 mg t.i.d. for 5 days just making sure there is  not a C. difficile colitis giving her the looser bowel movements, and now on  total she has only had approximately 3 feet of the terminal ileum removed  and this should not be enough of a length that she  will have chronic problems with diarrhea.  Her incision is healing nicely  and she will be seen in my office in follow-up in approximately a week.  She  has suggested that she would like to see a gastroenterologist as she is  recovering because of the intermittent loose bowel movements and I am  planning to get her seen by Dr. Nadine Counts Buccini.      WJW/MEDQ  D:  07/16/2004  T:  07/16/2004  Job:  [id number]   cc:   Bernette Redbird, M.D.  [address] [place]  [place], [place] [address]  Fax: [phone]

## 2010-12-06 NOTE — Op Note (Signed)
[name], [name]                      ACCOUNT NO.:  [id number]   MEDICAL RECORD NO.:  [id number]                   PATIENT TYPE:  INP   LOCATION:  0379                                 FACILITY:  [facility]   PHYSICIAN:  Anselm Pancoast. Zachery Dakins, M.D.          DATE OF BIRTH:  [birth year]   DATE OF PROCEDURE:  04/09/2004  DATE OF DISCHARGE:                                 OPERATIVE REPORT   PREOPERATIVE DIAGNOSIS:  ___________ obstruction, small bowel obstruction.   OPERATION:  Exploratory laparotomy, resection of terminal ileum and  ileocecal anastomosis.   ANESTHESIA:  General.   SURGEON:  Anselm Pancoast. Zachery Dakins, M.D.   ASSISTANT:  Adolph Pollack, M.D.   HISTORY:  [name] is a 45 year old Caucasian female who presented  to the emergency room by ambulance at approximately midnight with severe  abdominal pain of approximately 3-4 hours' duration.  Patient states that  last spring, she had an operation for a bowel problem secondary to  endometriosis or appendicitis with approximately six inches of small bowel  removed.  An anastomosis from the small bowel to the cecum, where the  appendix had been, was performed.  The patient has done satisfactorily and  she has had good bowel function since then, and then started having severe  abdominal pain at approximately 7 or 8:00 last evening.  Called her friend.  After some decision, an ambulance was called, and she arrived to the  emergency room shortly before midnight.  When she was seen by Dr. Effie Shy,  pain medicines started, IV fluids started, and then ultimately had a CT that  showed a closed loop obstruction in the right lower quadrant.  Her white  count was about 19,000, and she was definitely very tender in the lower  abdomen.  I was called about 3:00 a.m. and saw her shortly afterwards.  An  NG tube was placed.  Potassium originally was 2.6, supposedly.  We gave her  a potassium bolus, and repeated potassium was now 4.3,  so I suspect the 2.6  was an error.   She is allergic to [facility], which she was taking when she was operated on  previously, with hives.  Admission was obtained.   The patient's sister arrived, probably at about 4:30 a.m. and stated that  the patient's mother desired that she be transferred to [facility].  I asked  them to definitely obtain the name of the accepting surgeon, that she needed  urgent surgery.  Then the patient said she did not want to go to [facility]  and wanted to proceed on with surgery here.  A repeat potassium returned,  and this was 4.3.  She was brought on to the operative suite area at  approximately 4:30 or so.  The patient was taken back to the operative  suite.  Induction of general anesthesia with endotracheal tube.  She was  given 400 mg of Cipro.  A Foley catheter inserted.  The abdomen was prepped  with Betadine and surgical solution and draped in a sterile manner.  She had  about a 4 inch incision with kind of a hypertrophied scar in her lower  abdomen, and I basically excised this.  I opened carefully into and slightly  above this incision, but it was still all below the umbilicus.  Upon opening  into the peritoneal cavity, there was bloody, nonfoul-smelling fluid and a  large segment of small bowel that was basically black, probably about 2 feet  of small bowel in total.  She had twisted around an adhesion from the  anastomosis area to the lower incision.  This was divided quickly, but the  bowel did not pink up at all.  This looked as if, I could not tell whether  this was a stapled or hand-sewn previous anastomosis from the terminal ileum  and in onto the cecum, where probably the appendix used to have been.  The  last inch of the distal small bowel was not frankly dead but I thought that  it would be very unwise to try to leave that little short segment and would  be best to go ahead and divide the distal small bowel where the old  anastomosis have been  formed, and I transected this with a GIA with the  short staples.  This was to allow [place] some motility so that we could actually  free up the mesentery going into this segment.  This was done between  [facility], which were ligated with 2-0 silk and then went back to the area  where the infarction had started.  The remaining bowel was definitely viable  and really do not see much evidence, if any, endometriosis.  There is a  little fibroid on her uterus.  Dr. Abbey Chatters had scrubbed in at this time,  and we did an end-to-end, single-layer 3-0 silk anastomosis from the distal  small bowel to the cecum, really right at the area where the previous  anastomosis had been performed.  There was a little bit of discrepancy in  the colon.  Small bowel, which was adjusted with the stitches, and then with  the anastomosis appearing water-tight and lying comfortable, a little small  mesentery defect was closed with a couple of 3-0 silk sutures.  The bowel  will lay in the right lower quadrant __________ tension.  I then irrigated  out, the remaining portion the old bloody fluid was aspirated, and it was at  this point, really, when looking under the uterus, there was a little bit of  nodularity, but it is probably old endometriosis, but certainly nothing of  any significant endometriosis and really, no other adhesions to the small  bowel at all.  It was not a generous omentum, but I could bring down the  transverse colon and a little bit of omentum to go over it and kind of  separate where the anastomosis is to the abdominal incision.  The NG tube is  in good position.  I then closed the incision in a single layer with #1 PDS  and tried to bury the knots, since she is thin.  The skin edges were raised  slightly and then brought together with 4-0 Vicryl and then skin staples.  I  am going to keep her on Cipro for a couple of days and will definitely keep the NG tube until she is having bowel function.  There  is a moderate amount  of firm stool within  the descending and sigmoid colon.  Unfortunately, there  was a little spillage from either the proximal bowel or the cecal area with  the open anastomosis.  I would have been preferred, if she had not been a  redo, to have done a staple with the areas, etc.  I think this was better  than trying to do some variation of a redo stapled anastomosis.  Sponge and  needle counts were correct.  Estimated blood loss is minimal.      WJW/MEDQ  D:  04/09/2004  T:  04/09/2004  Job:  [id number]

## 2010-12-06 NOTE — H&P (Signed)
[name], BLOODSAW                      ACCOUNT NO.:  [id number]   MEDICAL RECORD NO.:  [id number]                   PATIENT TYPE:  INP   LOCATION:  0103                                 FACILITY:  [facility]   PHYSICIAN:  Anselm Pancoast. Zachery Dakins, M.D.          DATE OF BIRTH:  06-May-1966   DATE OF ADMISSION:  04/08/2004  DATE OF DISCHARGE:                                HISTORY & PHYSICAL   CHIEF COMPLAINT:  Severe abdominal pain.   HISTORY:  [name] is a 45 year old Caucasian female who was doing  well until yesterday evening.  She started having severe lower abdominal  pain, this became very tense, she called a friend, who came over and then  ultimately elected to bring her by ambulance to the emergency room, and  arrived here probably about 11:30.  She was seen by  Dr. Markham Jordan L. Effie Shy,  stated that she was on Celexa and Tagamet and had been taking some Pepto-  Bismol for the abdominal pain and first she was given 4 mg of morphine, x-  rays ordered and I think they proceeded on to a CT and she was given oral  contrast, and then this was completed probably about 3 a.m. with the finding  of a closed loop small-bowel obstruction, and the radiologist talked to Dr.  Effie Shy and said it was a medical emergency.  I was called about 3 a.m. and  arrived shortly afterwards.  The patient was sedated, but awake and  talkative, definitely very tender in the lower abdomen with a big fullness  in the right lower quadrant.  Nasogastric tube was placed, a large amount of  predominantly contrast removed and the patient stated that she was allergic  to Zosyn and developed hives when she was operated on previously, and  ordered Cipro.  At this point, the laboratory studies were checked and white  count was 18,800 with a significant left shift; her potassium, however, was  2.6 and she was given 1 bolus of potassium, repeated it and it returned back  at 4.3, and I suspect that 2.6 was an error.  Her  BUN and creatinine were  all normal, and she denied any use of diuretics or chronic diarrhea, and  etc.  Permission was obtained and she was brought on over to the emergency  room probably about 5:30 a.m. or 5 o'clock.   PAST MEDICAL HISTORY:  1.  The previous noted above.  2.  Surgery that had been done in [facility].  Supposedly, she had had      problems with endometriosis prior to that.   SOCIAL HISTORY:  I do not think she is married and I am not sure whether she  has ever been married.   PHYSICAL EXAM:  GENERAL:  A thin female who appears stated age in pretty  marked abdominal pain.  VITAL SIGNS:  Temperature was 97.9, pulse 55, respirations 18, blood  pressure is 133/76.  HEENT:  Eyes, ears, nose and throat were negative.  LUNGS:  Clear.  BREASTS:  Negative.  CARDIAC:  Normal sinus rhythm.  ABDOMEN:  A very quiet abdomen with definitely a fullness and marked  tenderness in the right lower quadrant.  RECTAL:  Some stool in the rectum, but not Hemoccult-positive.  EXTREMITIES:  Extremities are not edematous and good peripheral pulses.  CNS:  CNS appears physiologic.   ADMITTING IMPRESSION:  Small-bowel obstruction, probably a closed loop.  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   PLAN:  The patient will have an urgent laparotomy and understands that she  may need repeat bowel resection if the closed loop has infarcted.      WJW/MEDQ  D:  04/09/2004  T:  04/09/2004  Job:  [id number]

## 2010-12-06 NOTE — H&P (Signed)
[name], [name] NO.:  [id number]   MEDICAL RECORD NO.:  [id number]          PATIENT TYPE:  INP   LOCATION:  0101                         FACILITY:  [facility]   PHYSICIAN:  Michaelyn Barter, M.D. DATE OF BIRTH:  Nov 21, 1965   DATE OF ADMISSION:  06/07/2004  DATE OF DISCHARGE:                                HISTORY & PHYSICAL   The patient's primary care physician is Juline Patch, M.D.  The patient's  general surgeon is Anselm Pancoast. Zachery Dakins, M.D.   CHIEF COMPLAINT:  Sharp pain.   HISTORY OF PRESENT ILLNESS:  The patient is a 45 year old female with a past  medical history of depression and multiple abdominal surgeries secondary to  abdominal pain, who states that her abdomen started hurting last night at  approximately 8 o'clock.  She stated that she had finished eating her dinner  approximately 1-1/2 hours prior to the onset of the pain.  She ate chicken  parmesan with eggplant for dinner.  Her abdominal pain is described as  midabdominal in location.  It is burning and stabbing both.  Earlier it was  approximately 10/10 in intensity.  The pain does not travel to the back.  In  addition to the pain, she stated she has also had four episodes of emesis  following the onset of the pain.  She has also experienced chills but does  not believe that she has experienced any fevers.  She had one episode of  bowel movement tonight, and she denies the presence of blood being in her  stool.  She states that the pain that she is currently having is the same  pain that she had previously prior to the surgery that was performed by Dr.  Zachery Dakins.   PAST MEDICAL HISTORY:  1.  Depression.  2.  Endometriosis.  3.  Low blood pressure.   PAST SURGICAL HISTORY:  1.  Laparoscopy for adhesions secondary to endometriosis in 1995.  2.  Six inches of small bowel resection secondary to either endometriosis or      appendicitis per previous computer notes.  3.  Exploratory  laparotomy and resection of small bowel and lysis of      adhesions with ileocecal anastomosis on April 09, 2004, performed by      Anselm Pancoast. Zachery Dakins, M.D.   ALLERGIES:  ZOSYN produces hives.   HOME MEDICATIONS:  1.  Lexapro 10 mg daily.  2.  Multivitamin.   SOCIAL HISTORY:  1.  Cigarettes:  One pack per day since the age of 25.  2.  Alcohol occasionally.  3.  Street drugs:  The patient denied.   FAMILY HISTORY:  The mother had a history of breast cancer.  Father deceased  from pancreatic cancer.  He also had diabetes mellitus.   REVIEW OF SYSTEMS:  As per HPI, otherwise all other systems are negative.   PHYSICAL EXAMINATION:  GENERAL:  The patient appears to be uncomfortable  secondary to abdominal pain.  She requests medication for her pain.  VITAL SIGNS:  Temperature 97.8, blood pressure 144/90, heart rate 77,  respirations 20, SPO2 100%.  HEENT:  Anicteric.  Extraocular motions are intact.  NECK:  Supple, no lymphadenopathy, thyroid not palpable.  CARDIAC:  S1, S2 is present, regular rate and rhythm, no murmurs, no  gallops, no rubs.  RESPIRATORY:  Clear bilaterally, no crackles, no wheezes.  ABDOMEN:  Flat, soft, positive bowel sounds in all four quadrants.  Diffuse  tenderness in all four quadrants; however, the patient points to the  midgastrium as the point of greatest pain.  There is no rebound.  There is  no guarding.  She said the pain is deep during the examination.  EXTREMITIES:  No leg edema.   LABORATORY DATA:  Potassium 2.8.   ASSESSMENT AND PLAN:  1.  Abdominal pain.  The etiology is questionable; however, in a patient      with multiple previous surgeries on her abdomen, including intestinal      resection, one has to be concerned about the possibility of obstruction      secondary to adhesion, but again with the patient stating that she had a      bowel movement tonight, I question a diagnosis of obstruction at this      particular time.  Likewise,  the fact that the pain began following meal      consumption is alarming for the possibility of ischemia.  A CT scan has      been ordered.  Will review the findings once the scan is completed.      Will provide pain medication for now p.r.n.  Will contact the patient's      surgeon, Dr. Zachery Dakins, once the CT results are available.  Will start      Protonix 40 mg IV daily.  2.  Nausea and emesis.  This is related to #1 above.  Will provide      aggressive IV fluid hydration.  Will also provide Phenergan 12.5 mg IV      p.r.n. q.8h.  3.  Depression.  Will continue the patient's home regimen of Lexapro.  4.  Deep vein thrombosis prophylaxis.  Will provide Lovenox 30 mg IV q.24h.     Orla   OR/MEDQ  D:  06/07/2004  T:  06/07/2004  Job:  [id number]   cc:   Juline Patch, M.D.  [address]  [place], [place] [address]  Fax: [phone]   Anselm Pancoast. Zachery Dakins, M.D.  [address]  [place]  [place] [address]  Fax: [phone]

## 2011-04-15 ENCOUNTER — Other Ambulatory Visit: Payer: Self-pay | Admitting: Registered Nurse

## 2011-04-15 ENCOUNTER — Other Ambulatory Visit (HOSPITAL_COMMUNITY)
Admission: RE | Admit: 2011-04-15 | Discharge: 2011-04-15 | Disposition: A | Discharge status: Home / Self Care | Payer: Self-pay | Source: Ambulatory Visit | Attending: Internal Medicine | Admitting: Internal Medicine

## 2011-04-15 DIAGNOSIS — Z01419 Encounter for gynecological examination (general) (routine) without abnormal findings: Secondary | ICD-10-CM | POA: Insufficient documentation

## 2012-10-21 ENCOUNTER — Other Ambulatory Visit: Payer: Self-pay

## 2012-10-21 DIAGNOSIS — Z1231 Encounter for screening mammogram for malignant neoplasm of breast: Secondary | ICD-10-CM

## 2012-11-17 ENCOUNTER — Ambulatory Visit
Admission: RE | Admit: 2012-11-17 | Discharge: 2012-11-17 | Disposition: A | Discharge status: Home / Self Care | Payer: BC Managed Care – PPO | Source: Ambulatory Visit

## 2012-11-17 DIAGNOSIS — Z1231 Encounter for screening mammogram for malignant neoplasm of breast: Secondary | ICD-10-CM

## 2012-11-17 IMAGING — MG MM DIGITAL SCREENING BILAT
4 series · 4 of 4 positions shown · non-contrast
Comparison: Previous exams.

CLINICAL DATA: Screening.

DIGITAL BILATERAL SCREENING MAMMOGRAM WITH CAD

[R CC]
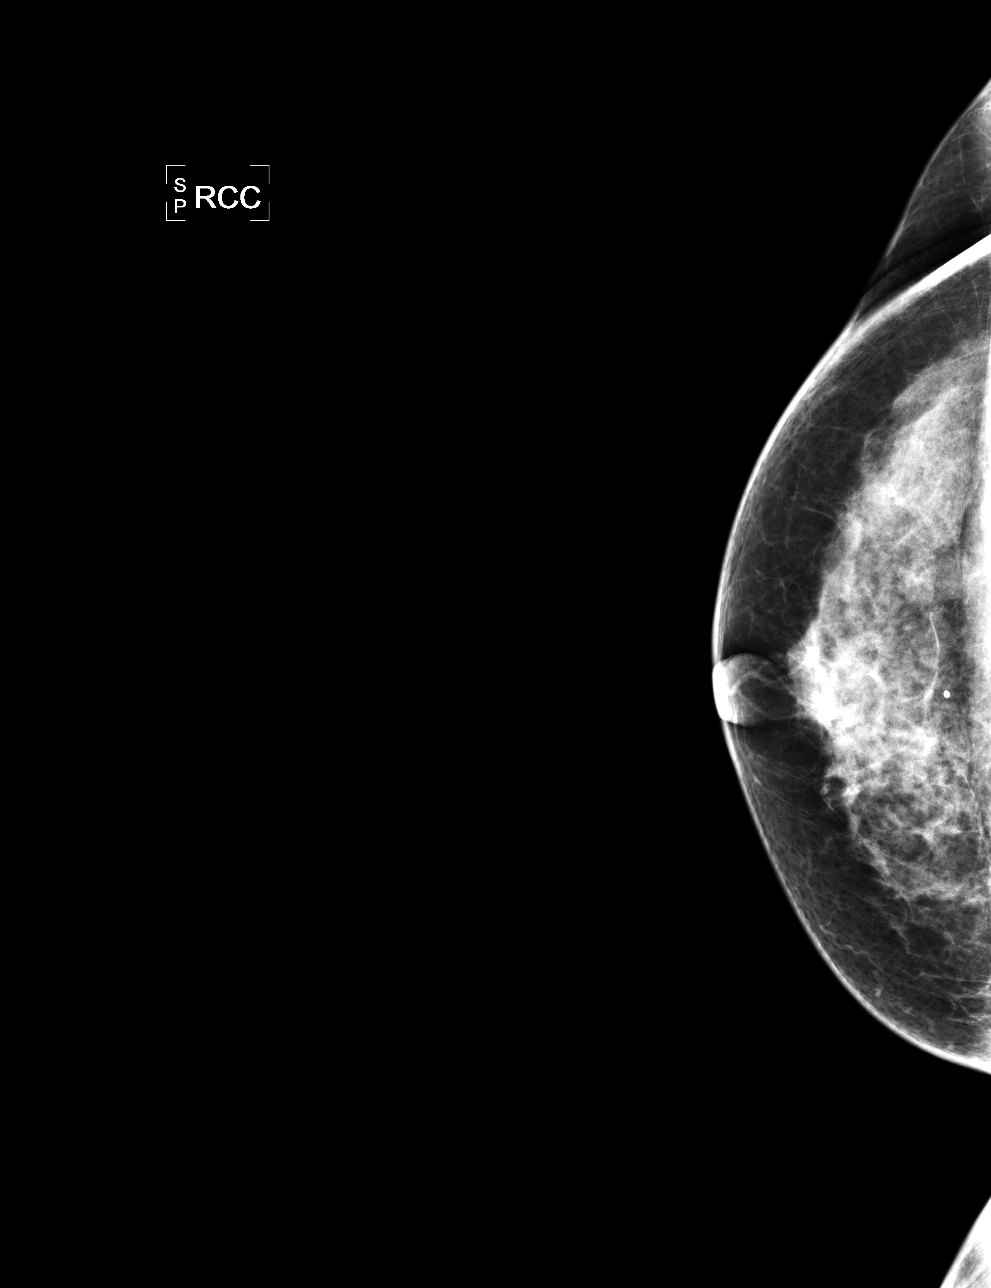

[L CC]
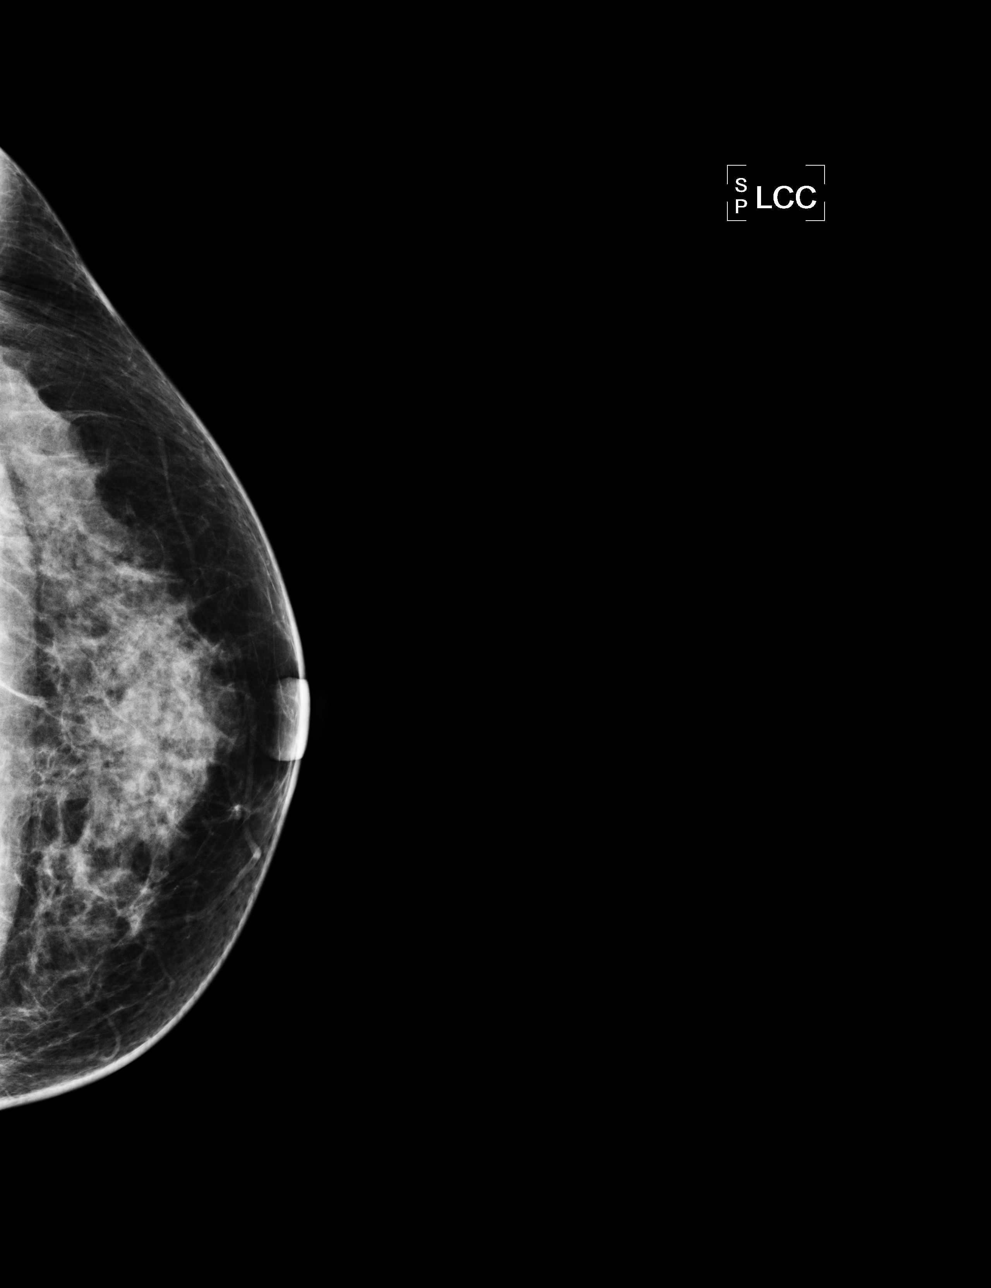

[L MLO]
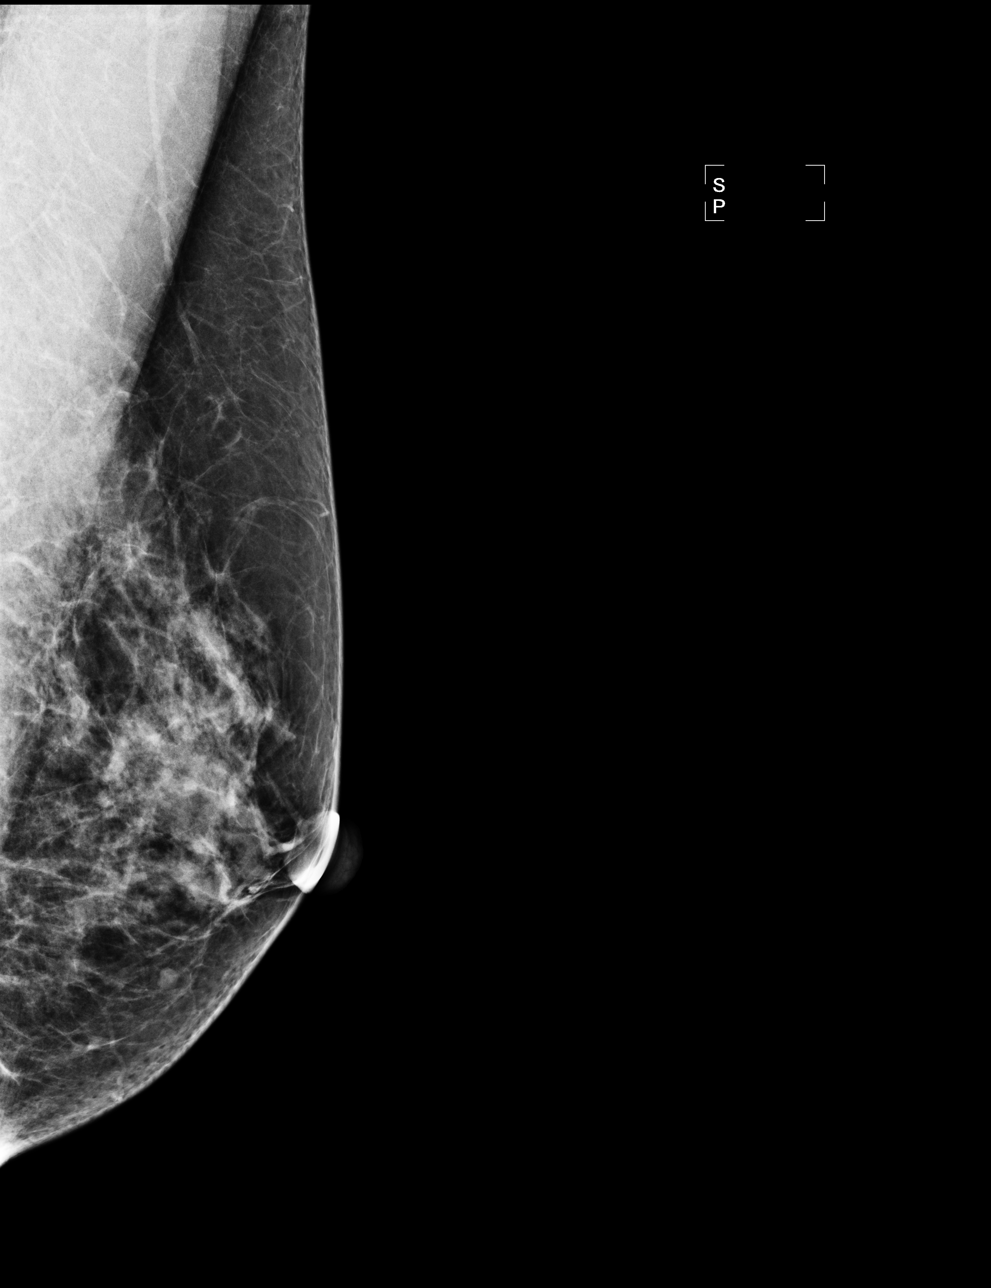

[R MLO]
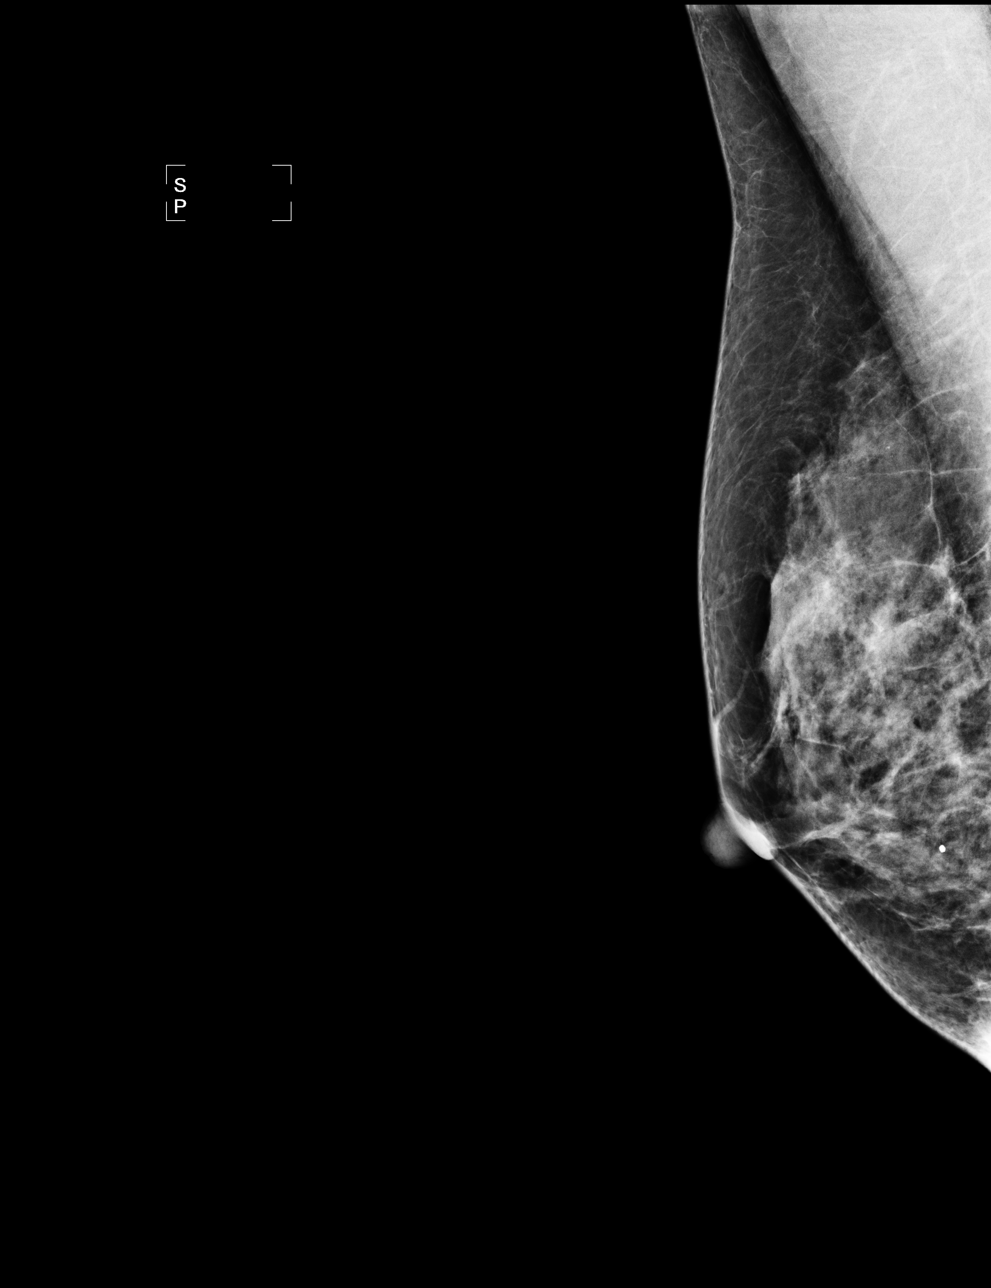

[4 of 4 positions shown; findings below may reference images not displayed]

FINDINGS: ACR Breast Density Category 3: The breast tissue is heterogeneously
dense.

In the left breast, a possible mass warrants further evaluation
with spot compression views and possibly ultrasound.  In the right
breast, no masses or malignant type calcifications are identified.

Images were processed with CAD.
IMPRESSION: Further evaluation is suggested for possible mass in the left
breast.

RECOMMENDATION:
Diagnostic mammogram and possibly ultrasound of the left breast.
(Code:[OQ])

The patient will be contacted regarding the findings, and
additional imaging will be scheduled.

BI-RADS CATEGORY 0:  Incomplete.  Need additional imaging
evaluation and/or prior mammograms for comparison.

## 2012-11-18 ENCOUNTER — Other Ambulatory Visit: Payer: Self-pay | Admitting: Internal Medicine

## 2012-11-18 DIAGNOSIS — R928 Other abnormal and inconclusive findings on diagnostic imaging of breast: Secondary | ICD-10-CM

## 2012-11-30 ENCOUNTER — Other Ambulatory Visit (HOSPITAL_COMMUNITY): Payer: Self-pay | Admitting: Internal Medicine

## 2012-11-30 DIAGNOSIS — R079 Chest pain, unspecified: Secondary | ICD-10-CM

## 2012-12-02 ENCOUNTER — Ambulatory Visit
Admission: RE | Admit: 2012-12-02 | Discharge: 2012-12-02 | Disposition: A | Discharge status: Home / Self Care | Payer: BC Managed Care – PPO | Source: Ambulatory Visit | Attending: Internal Medicine | Admitting: Internal Medicine

## 2012-12-02 DIAGNOSIS — R928 Other abnormal and inconclusive findings on diagnostic imaging of breast: Secondary | ICD-10-CM

## 2012-12-02 IMAGING — MG MM DIGITAL DIAGNOSTIC LIMITED*L*
2 series · 2 of 2 positions shown · non-contrast
Comparison: Prior exams dating back to [SQ]

CLINICAL DATA: Screening callback for questioned left breast mass

DIGITAL DIAGNOSTIC LEFT MAMMOGRAM WITH CAD
Mammographic images were processed with CAD.

[L MLO]
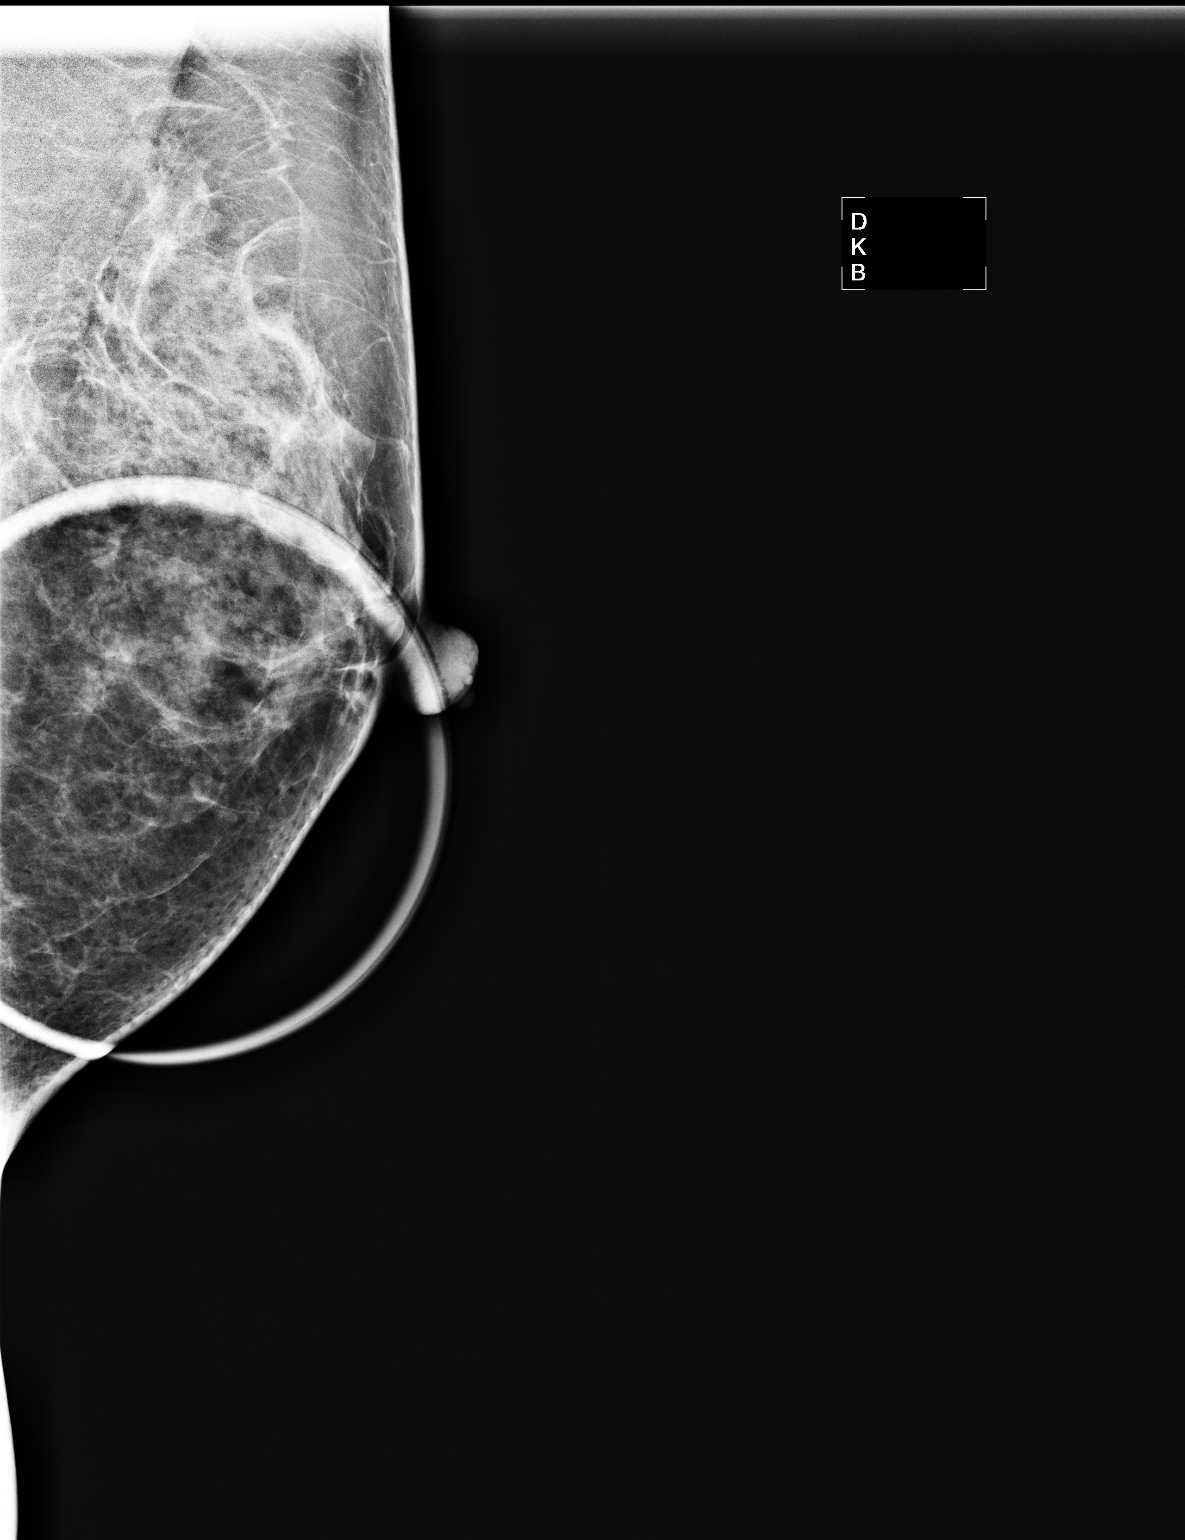

[L ML]
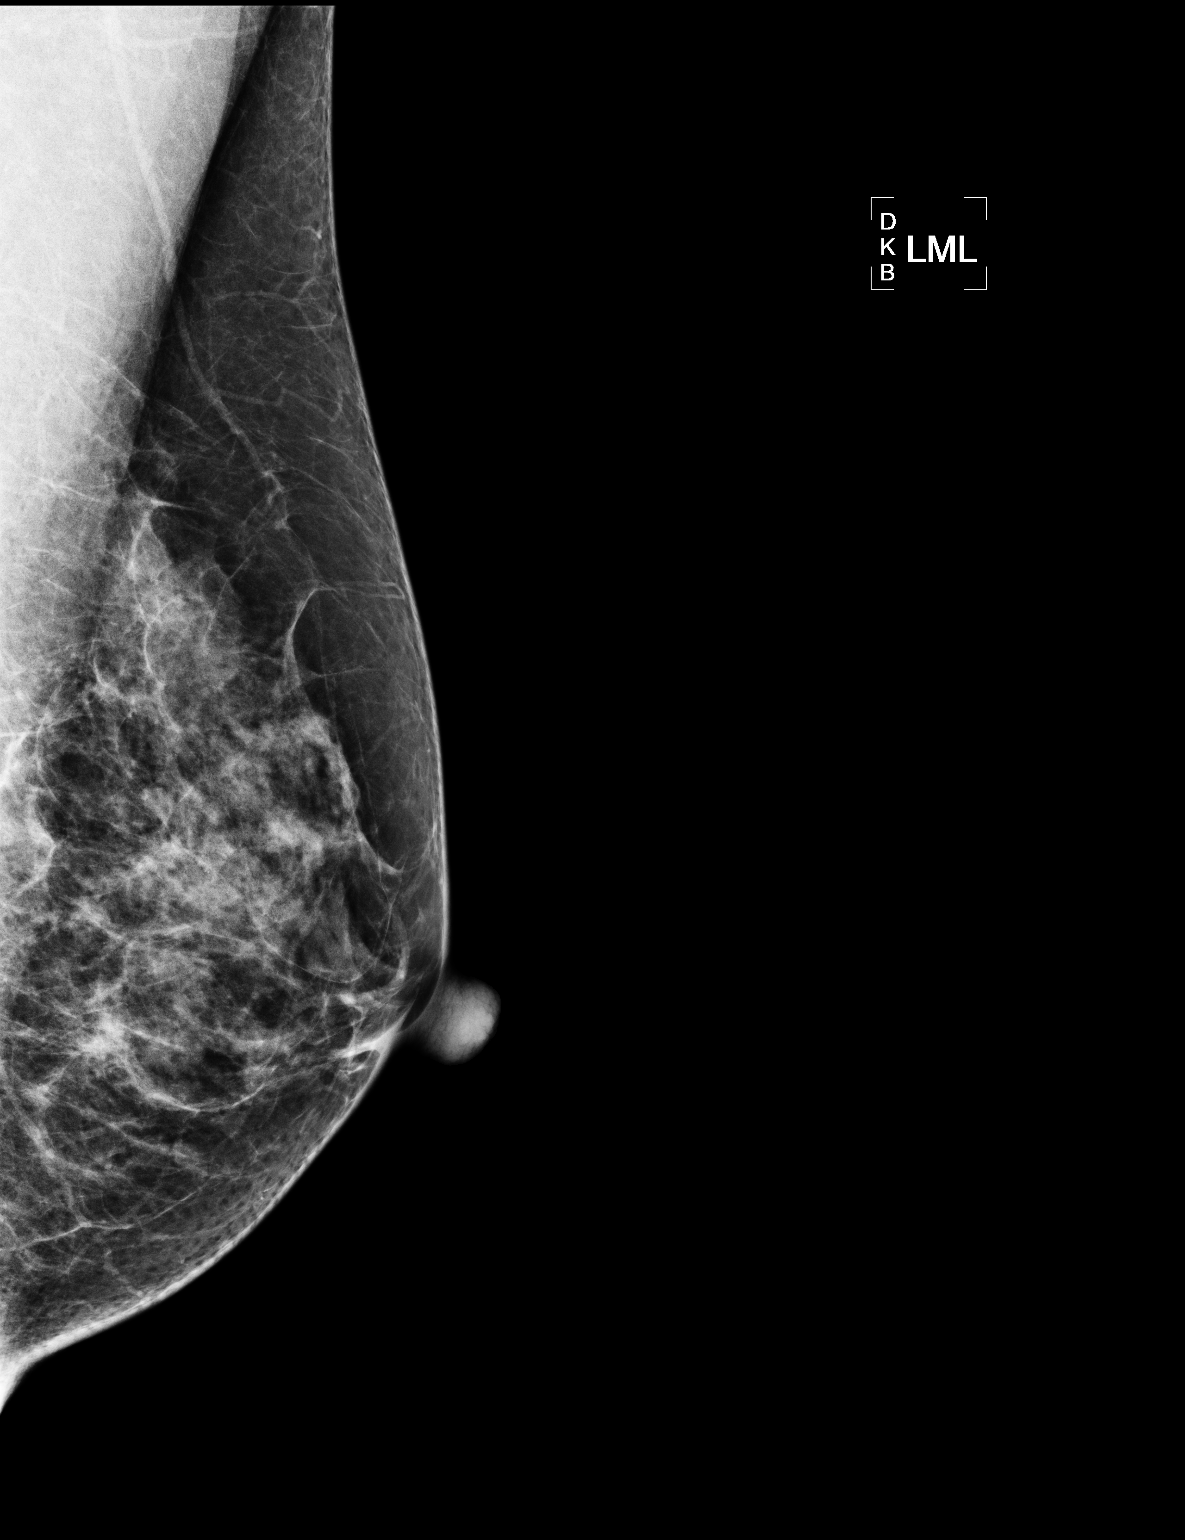

[2 of 2 positions shown; findings below may reference images not displayed]

FINDINGS: ACR Breast Density Category 3: The breast tissue is heterogeneously
dense.

Additional views confirm the presence of a lucent centered oval
circumscribed sub centimeter mass in the left inferior breast.
Compared back to prior exams including [SQ], this is unchanged,
compatible with a benign appearing intramammary lymph node.

Mammographic images were processed with CAD.
IMPRESSION: No evidence for malignancy in the left breast.  Stable intramammary
benign-appearing lymph node.

RECOMMENDATION:
Screening mammogram in one year. (Code:[SQ])

I have discussed the findings and recommendations with the patient.
Results were also provided in writing at the conclusion of the
visit.  If applicable, a reminder letter will be sent to the
patient regarding her next appointment.

BI-RADS CATEGORY 1:  Negative.

## 2012-12-03 ENCOUNTER — Ambulatory Visit (HOSPITAL_COMMUNITY)
Admission: RE | Admit: 2012-12-03 | Discharge: 2012-12-03 | Disposition: A | Discharge status: Home / Self Care | Payer: BC Managed Care – PPO | Source: Ambulatory Visit | Attending: Cardiovascular Disease | Admitting: Cardiovascular Disease

## 2012-12-03 DIAGNOSIS — R9439 Abnormal result of other cardiovascular function study: Secondary | ICD-10-CM | POA: Insufficient documentation

## 2012-12-03 DIAGNOSIS — R079 Chest pain, unspecified: Secondary | ICD-10-CM

## 2013-06-08 ENCOUNTER — Encounter (HOSPITAL_COMMUNITY): Payer: Self-pay | Admitting: Emergency Medicine

## 2013-06-08 ENCOUNTER — Emergency Department (HOSPITAL_COMMUNITY)
Admission: EM | Admit: 2013-06-08 | Discharge: 2013-06-08 | Disposition: A | Discharge status: Home / Self Care | Payer: BC Managed Care – PPO | Attending: Emergency Medicine | Admitting: Emergency Medicine

## 2013-06-08 ENCOUNTER — Emergency Department (HOSPITAL_COMMUNITY): Payer: BC Managed Care – PPO

## 2013-06-08 DIAGNOSIS — Z90710 Acquired absence of both cervix and uterus: Secondary | ICD-10-CM | POA: Insufficient documentation

## 2013-06-08 DIAGNOSIS — R110 Nausea: Secondary | ICD-10-CM | POA: Insufficient documentation

## 2013-06-08 DIAGNOSIS — K5900 Constipation, unspecified: Secondary | ICD-10-CM

## 2013-06-08 DIAGNOSIS — Z79899 Other long term (current) drug therapy: Secondary | ICD-10-CM | POA: Insufficient documentation

## 2013-06-08 DIAGNOSIS — Z9889 Other specified postprocedural states: Secondary | ICD-10-CM | POA: Insufficient documentation

## 2013-06-08 LAB — COMPREHENSIVE METABOLIC PANEL
ALT: 18 U/L (ref 0–35)
Alkaline Phosphatase: 134 U/L — ABNORMAL HIGH (ref 39–117)
BUN: 20 mg/dL (ref 6–23)
CO2: 27 mEq/L (ref 19–32)
GFR calc Af Amer: >90 mL/min (ref >90)
GFR calc non Af Amer: 78 mL/min — ABNORMAL LOW (ref >90)
Glucose, Bld: 137 mg/dL — ABNORMAL HIGH (ref 70–99)
Potassium: 3.8 mEq/L (ref 3.5–5.1)
Sodium: 138 mEq/L (ref 135–145)

## 2013-06-08 LAB — CBC WITH DIFFERENTIAL/PLATELET
Eosinophils Relative: 4 % (ref 0–5)
Hemoglobin: 14.4 g/dL (ref 12.0–15.0)
Lymphocytes Relative: 39 % (ref 12–46)
Lymphs Abs: 3.4 10*3/uL (ref 0.7–4.0)
MCH: 32.3 pg (ref 26.0–34.0)
MCV: 92.2 fL (ref 78.0–100.0)
Monocytes Relative: 8 % (ref 3–12)
Platelets: 250 10*3/uL (ref 150–400)
RBC: 4.46 MIL/uL (ref 3.87–5.11)
WBC: 8.7 10*3/uL (ref 4.0–10.5)

## 2013-06-08 LAB — CG4 I-STAT (LACTIC ACID): Lactic Acid, Venous: 1.22 mmol/L (ref 0.5–2.2)

## 2013-06-08 IMAGING — CR DG ABDOMEN ACUTE W/ 1V CHEST
3 series · 3 of 3 positions shown · non-contrast
Comparison: Abdominal series [DATE].

CLINICAL DATA: New onset lower abdominal pain with nausea and
vomiting. History of surgery and bowel obstruction.

EXAM:
ACUTE ABDOMEN SERIES (ABDOMEN 2 VIEW & CHEST 1 VIEW)

[w chest pa]
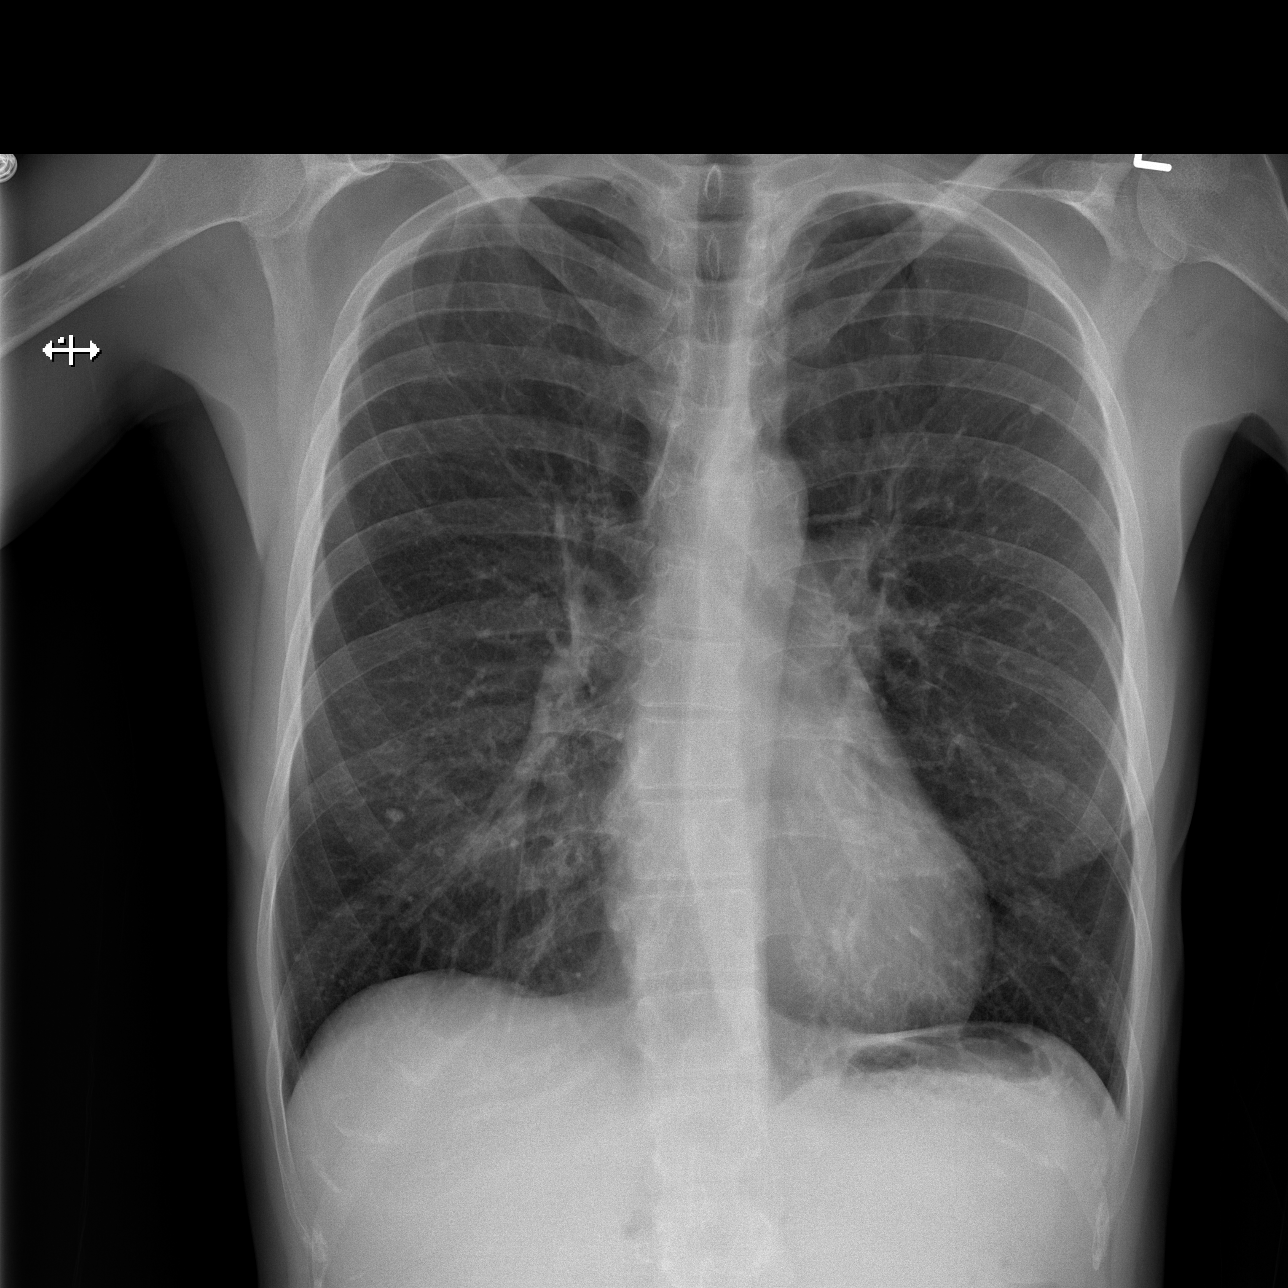

[w abdomen upright]
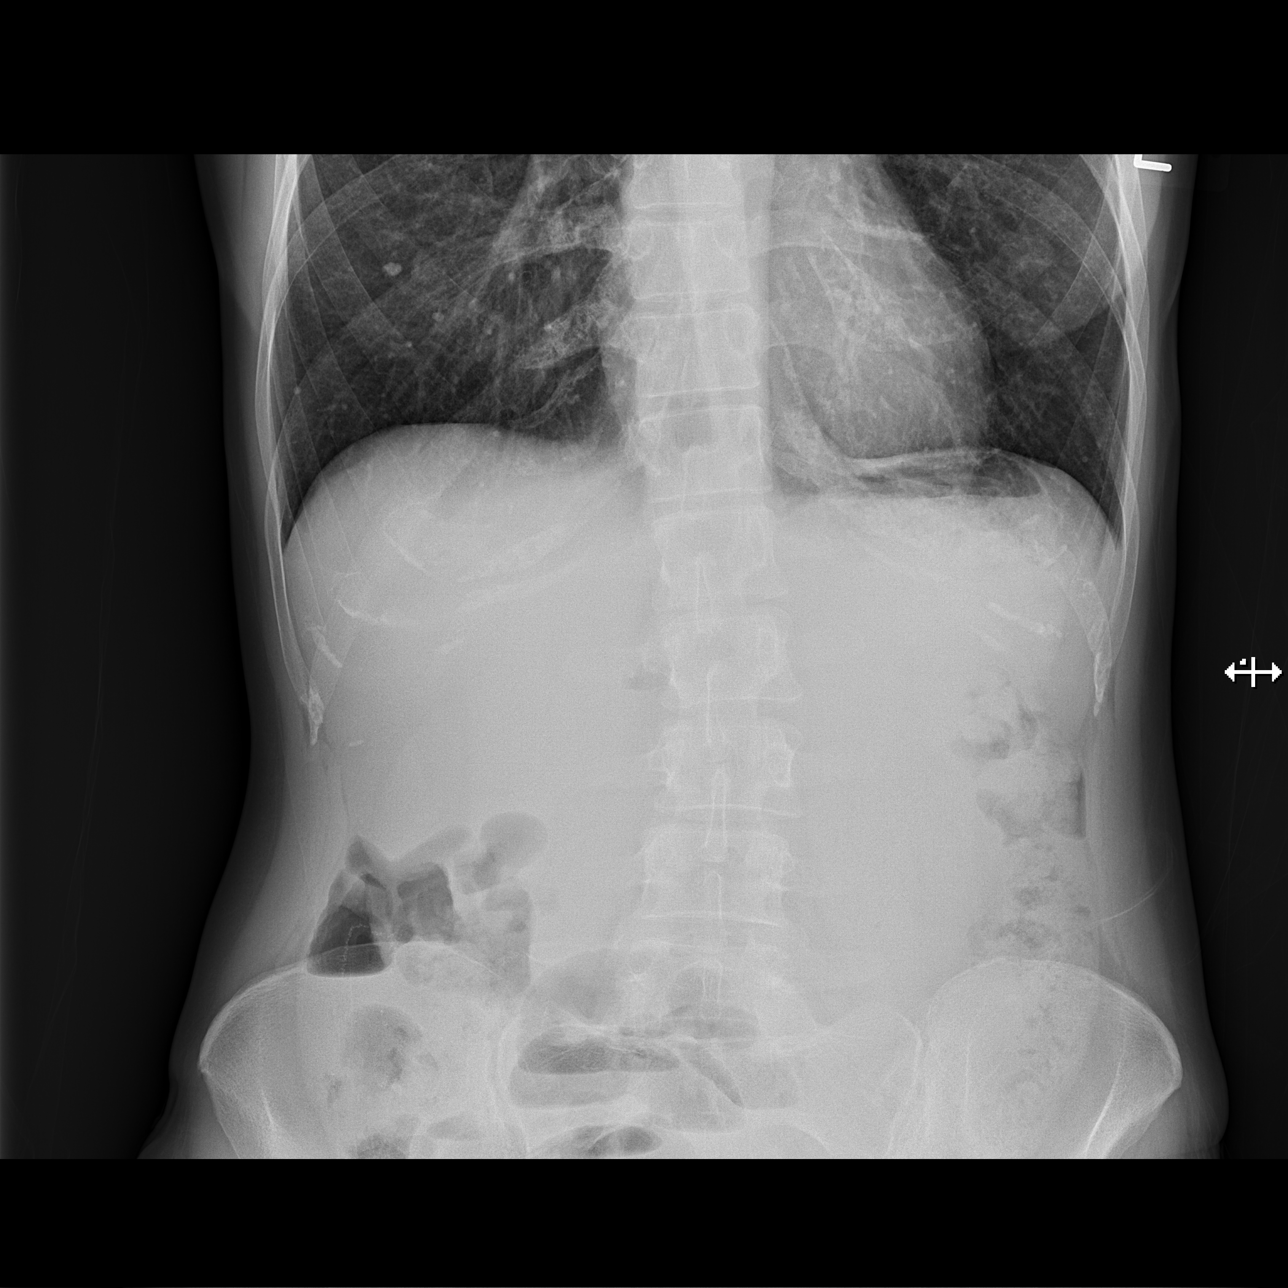

[t abdomen supine]
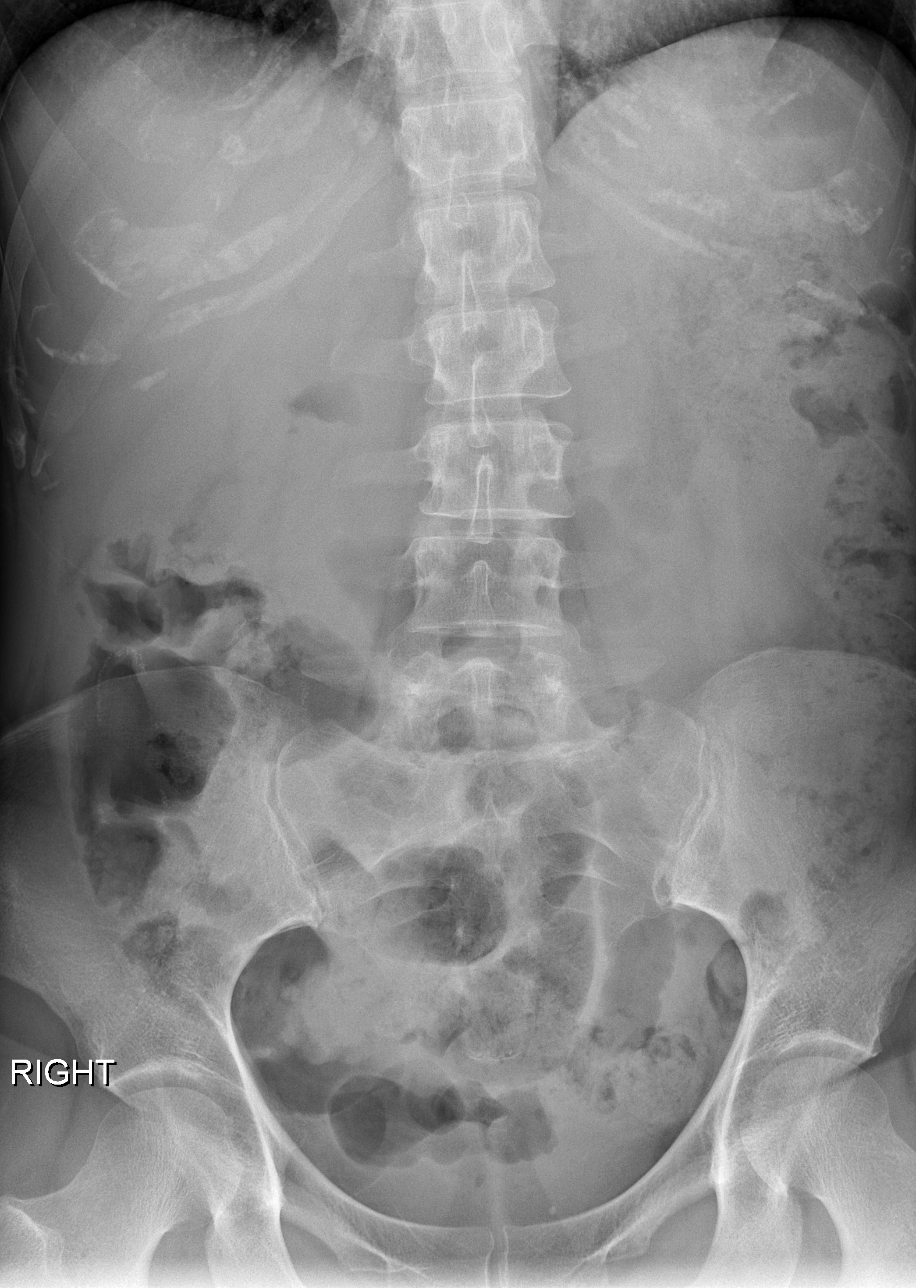

[3 of 3 positions shown; findings below may reference images not displayed]

FINDINGS: Cardiomediastinal silhouette is unremarkable and unchanged. Similar
pulmonary hyperexpansion with mild chronic interstitial changes and
right greater than left lower lobe granulomas. No pleural effusions
or focal consolidations. Pulmonary vasculature is unremarkable. No
pneumothorax. Mild broad dextroscoliosis. Soft tissue planes and
included osseous structures are nonsuspicious.

Bowel gas pattern is nondilated and nonobstructive. Surgical staples
in the right abdomen. Moderate amount of retained large bowel stool,
a few scattered small bowel air-fluid levels at varying levels seen
in the mid abdomen. No intra-abdominal mass effect or pathologic
calcifications. No intraperitoneal free air. Soft tissue planes and
included osseous structures are nonsuspicious.
IMPRESSION: Moderate amount of retained large bowel stool, a nonobstructive
air-fluid levels in the small bowel may reflect enteritis, mild
focal ileus, less likely early obstruction.

Emphysematous changes without superimposed acute cardiopulmonary
process.

  By: KOMAL

## 2013-06-08 MED ORDER — SODIUM CHLORIDE 0.9 % IV SOLN
Freq: Once | INTRAVENOUS | Status: AC
  Administered 2013-06-08: 02:00:00 via INTRAVENOUS

## 2013-06-08 MED ORDER — ONDANSETRON HCL 4 MG/2ML IJ SOLN
4.0000 mg | Freq: Once | INTRAMUSCULAR | Status: AC
  Administered 2013-06-08: 4 mg via INTRAVENOUS
  Filled 2013-06-08: qty 2

## 2013-06-08 MED ORDER — HYDROMORPHONE HCL PF 1 MG/ML IJ SOLN
1.0000 mg | Freq: Once | INTRAMUSCULAR | Status: AC
  Administered 2013-06-08: 1 mg via INTRAVENOUS
  Filled 2013-06-08: qty 1

## 2013-06-08 MED ORDER — MINERAL OIL RE ENEM
1.0000 | ENEMA | Freq: Once | RECTAL | Status: AC
  Administered 2013-06-08: 1 via RECTAL
  Filled 2013-06-08: qty 1

## 2013-06-08 NOTE — ED Notes (Signed)
Pt reports abdominal pain to area of umbilicus, states hx of intestinal surgeries for volvulus, which she had 3 feet of bowel removed, and endometriosis of intestine and mass removal.  Pt reports nausea no vomiting, denies diarrhea LBM yesterday. Pt reports feeling "like I might faint" in triage.

## 2013-06-08 NOTE — ED Provider Notes (Signed)
CSN: [id number]     Arrival date & time 06/08/13  0045 History   None    Chief Complaint  Patient presents with  . 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  Has had several surgeries since that time including an total abdominal hysterectomy, and appendectomy Presents tonight with right lower quadrant pain. She states her bowel movements have been normal until last night, when she had a explosive loose stool after eating fast food, which she, states it always happens. Tonight.  She reports, that she is having nausea with the pain, but no vomiting, and no more diarrhea  The history is provided by the patient.    History reviewed. No pertinent past medical history. Past Surgical History  Procedure Laterality Date  . Appendectomy    . Bowel removal      5 inches  . Bowel resection      2 feet  . Abdominal hysterectomy     History reviewed. No pertinent family history. History  Substance Use Topics  . Smoking status: Never Smoker   . Smokeless tobacco: Never Used  . Alcohol Use: No   OB History   Grav Para Term Preterm Abortions TAB SAB Ect Mult Living                 Review of Systems  Constitutional: Negative for fever and chills.  Respiratory: Negative for shortness of breath.   Gastrointestinal: Positive for nausea and abdominal pain. Negative for vomiting, diarrhea and constipation.  Genitourinary: Negative for dysuria.  Musculoskeletal: Negative for joint swelling and myalgias.  All other systems reviewed and are negative.    Allergies  Sulfa antibiotics and Zosyn  Home Medications   Current Outpatient Rx  Name  Route  Sig  Dispense  Refill  . Calcium Carbonate-Vitamin D (CALCIUM + D PO)   Oral   Take 1 tablet by mouth daily.         . cholestyramine light (PREVALITE) 4 G packet   Oral   Take 4 g by  mouth daily.         Marland Kitchen escitalopram (LEXAPRO) 10 MG tablet   Oral   Take 10 mg by mouth daily.          BP 90/64  Pulse 62  Temp(Src) 97.5 F (36.4 C) (Oral)  Resp 20  Ht 5\' 6"  (1.676 m)  Wt 120 lb (54.432 kg)  BMI 19.38 kg/m2  SpO2 100%  LMP 11/18/2012 Physical Exam  Nursing note and vitals reviewed. Constitutional: She is oriented to person, place, and time. She appears well-developed and well-nourished. She appears distressed.  HENT:  Head: Normocephalic.  Eyes: Pupils are equal, round, and reactive to light.  Neck: Normal range of motion.  Cardiovascular: Normal rate and regular rhythm.   Pulmonary/Chest: Effort normal and breath sounds normal.  Abdominal: Soft. Bowel sounds are normal. She exhibits no distension. There is generalized tenderness. There is no rigidity, no rebound and no guarding.  Musculoskeletal: Normal range of motion.  Neurological: She is alert and oriented to person, place, and time.  Skin: Skin is warm. No erythema.    ED Course  Procedures (including critical care time) Labs Review Labs Reviewed  COMPREHENSIVE METABOLIC PANEL - Abnormal; Notable for the following:    Glucose, Bld 137 (*)    Alkaline Phosphatase 134 (*)    Total Bilirubin 0.2 (*)    GFR calc  non Af Amer 78 (*)    All other components within normal limits  CBC WITH DIFFERENTIAL  CG4 I-STAT (LACTIC ACID)   Imaging Review Dg Abd Acute W/chest  06/08/2013   CLINICAL DATA:  New onset lower abdominal pain with nausea and vomiting. History of surgery and bowel obstruction.  EXAM: ACUTE ABDOMEN SERIES (ABDOMEN 2 VIEW & CHEST 1 VIEW)  COMPARISON:  Abdominal series February 08, 2005.  FINDINGS: Cardiomediastinal silhouette is unremarkable and unchanged. Similar pulmonary hyperexpansion with mild chronic interstitial changes and right greater than left lower lobe granulomas. No pleural effusions or focal consolidations. Pulmonary vasculature is unremarkable. No pneumothorax. Mild broad  dextroscoliosis. Soft tissue planes and included osseous structures are nonsuspicious.  Bowel gas pattern is nondilated and nonobstructive. Surgical staples in the right abdomen. Moderate amount of retained large bowel stool, a few scattered small bowel air-fluid levels at varying levels seen in the mid abdomen. No intra-abdominal mass effect or pathologic calcifications. No intraperitoneal free air. Soft tissue planes and included osseous structures are nonsuspicious.  IMPRESSION: Moderate amount of retained large bowel stool, a nonobstructive air-fluid levels in the small bowel may reflect enteritis, mild focal ileus, less likely early obstruction.  Emphysematous changes without superimposed acute cardiopulmonary process.   Electronically Signed   By: Awilda Metro   On: 06/08/2013 01:38    EKG Interpretation   None       MDM   1. Constipation     Patien tgiven Fleets enema with + results, passed flatus and stool now feeling better  After having a large bowel.  Patient has been able to rest comfortably   Arman Filter, NP 06/08/13 [id number]  Arman Filter, NP 06/08/13 1191  Arman Filter, NP 06/08/13 [phone]

## 2013-06-08 NOTE — ED Provider Notes (Signed)
Medical screening examination/treatment/procedure(s) were performed by non-physician practitioner and as supervising physician I was immediately available for consultation/collaboration.   Loren Racer, MD 06/08/13 [phone]

## 2014-02-06 ENCOUNTER — Other Ambulatory Visit: Payer: Self-pay

## 2014-02-06 DIAGNOSIS — Z1231 Encounter for screening mammogram for malignant neoplasm of breast: Secondary | ICD-10-CM

## 2014-02-10 ENCOUNTER — Encounter (INDEPENDENT_AMBULATORY_CARE_PROVIDER_SITE_OTHER): Payer: Self-pay

## 2014-02-10 ENCOUNTER — Ambulatory Visit
Admission: RE | Admit: 2014-02-10 | Discharge: 2014-02-10 | Disposition: A | Discharge status: Home / Self Care | Payer: BC Managed Care – PPO | Source: Ambulatory Visit

## 2014-02-10 DIAGNOSIS — Z1231 Encounter for screening mammogram for malignant neoplasm of breast: Secondary | ICD-10-CM

## 2015-12-12 ENCOUNTER — Other Ambulatory Visit: Payer: Self-pay

## 2015-12-12 ENCOUNTER — Other Ambulatory Visit: Payer: Self-pay | Admitting: Internal Medicine

## 2015-12-12 DIAGNOSIS — R1011 Right upper quadrant pain: Secondary | ICD-10-CM

## 2015-12-12 DIAGNOSIS — R748 Abnormal levels of other serum enzymes: Secondary | ICD-10-CM

## 2015-12-12 DIAGNOSIS — Z1231 Encounter for screening mammogram for malignant neoplasm of breast: Secondary | ICD-10-CM

## 2015-12-19 ENCOUNTER — Ambulatory Visit
Admission: RE | Admit: 2015-12-19 | Discharge: 2015-12-19 | Disposition: A | Discharge status: Home / Self Care | Payer: BLUE CROSS/BLUE SHIELD | Source: Ambulatory Visit | Attending: Internal Medicine | Admitting: Internal Medicine

## 2015-12-19 DIAGNOSIS — R1011 Right upper quadrant pain: Secondary | ICD-10-CM

## 2015-12-19 DIAGNOSIS — R748 Abnormal levels of other serum enzymes: Secondary | ICD-10-CM

## 2015-12-19 IMAGING — US US ABDOMEN COMPLETE
1 series · 13 of 25 positions shown · non-contrast
Comparison: Abdominal and pelvic CT scan [DATE]

CLINICAL DATA: Right upper quadrant abdominal pain, elevated
alkaline phosphatase level

EXAM:
ABDOMEN ULTRASOUND COMPLETE

[Series 1: us abdomen complete · 0.32mm/px · 13 of 99 slices shown]
[im 1/99]
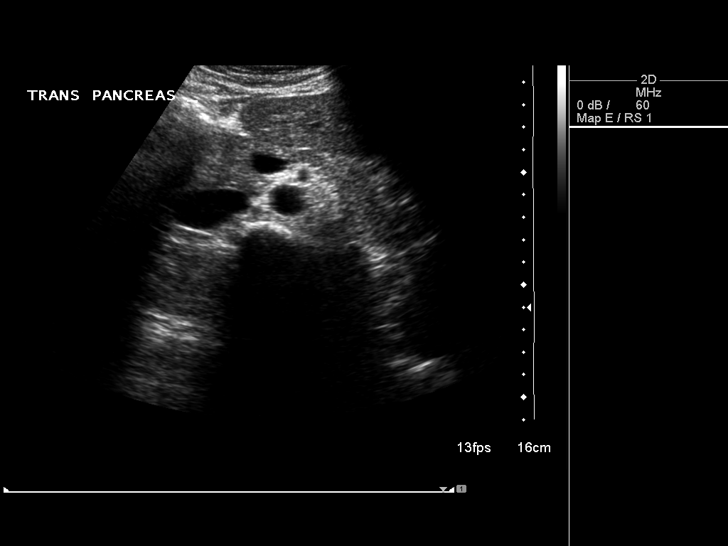
[im 9/99]
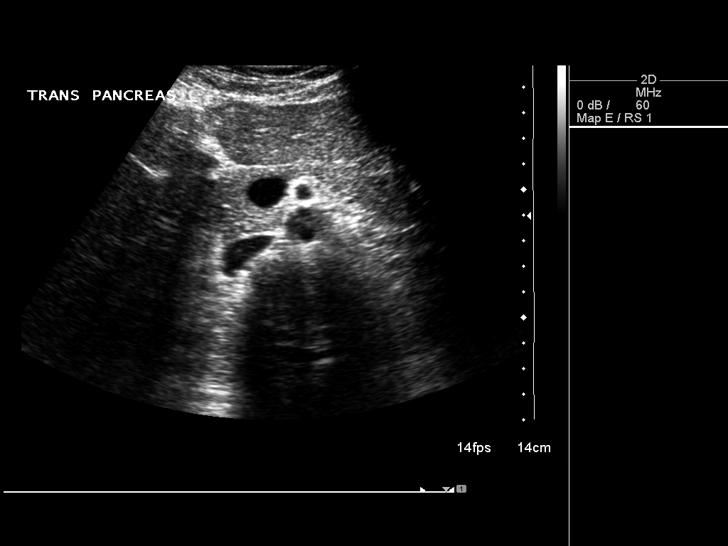
[im 17/99]
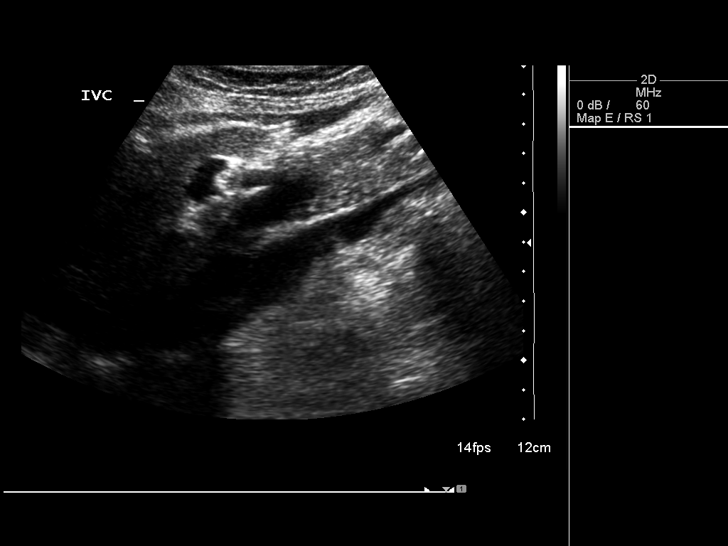
[im 25/99]
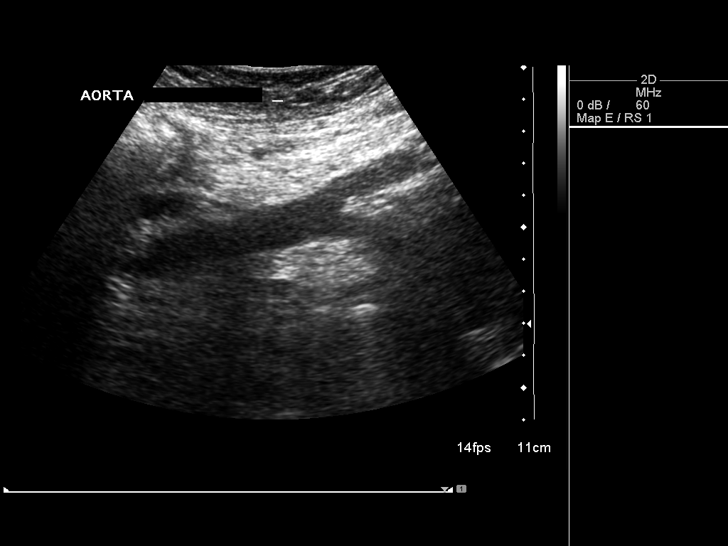
[im 33/99]
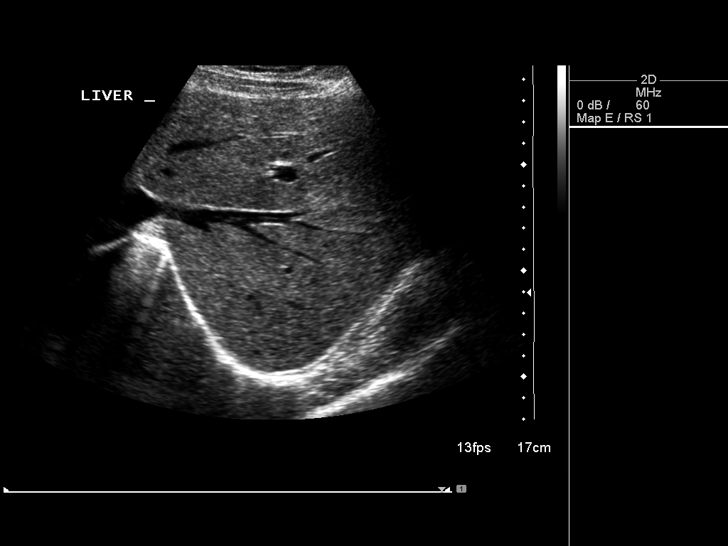
[im 41/99]
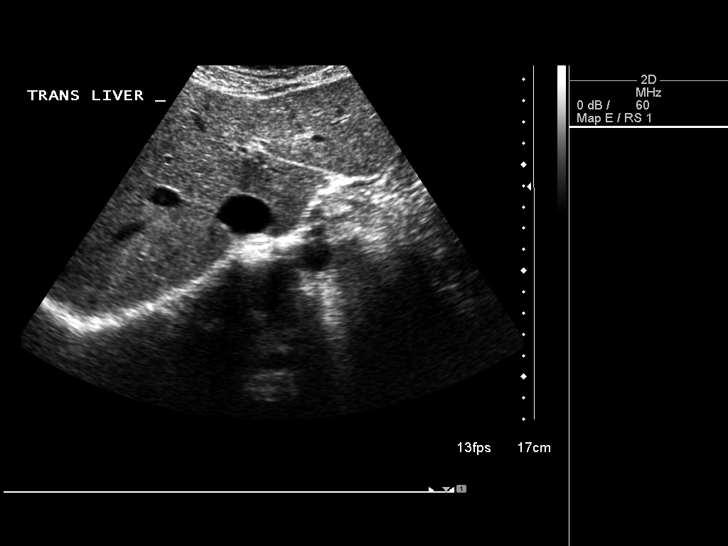
[im 50/99]
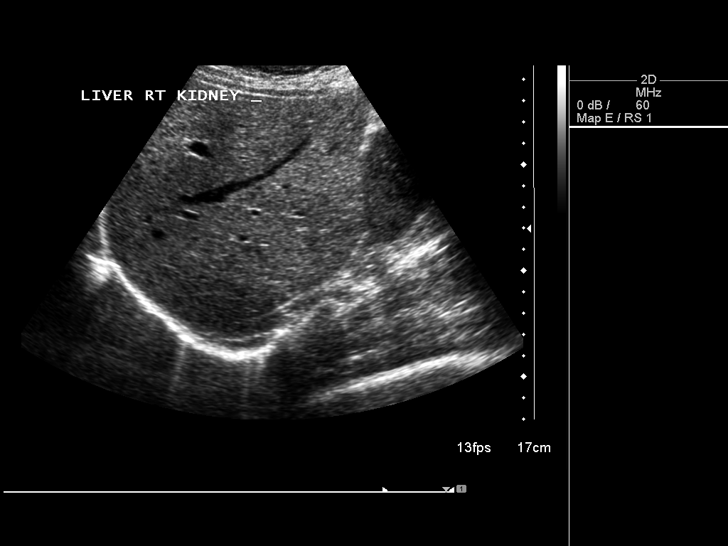
[im 58/99]
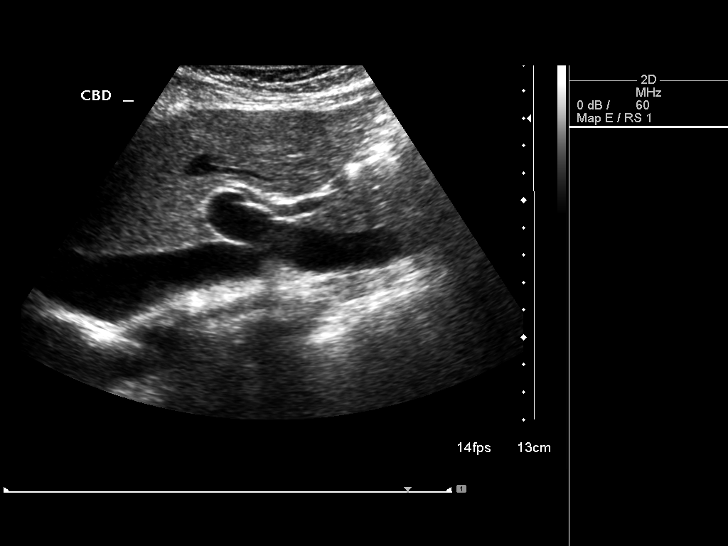
[im 66/99]
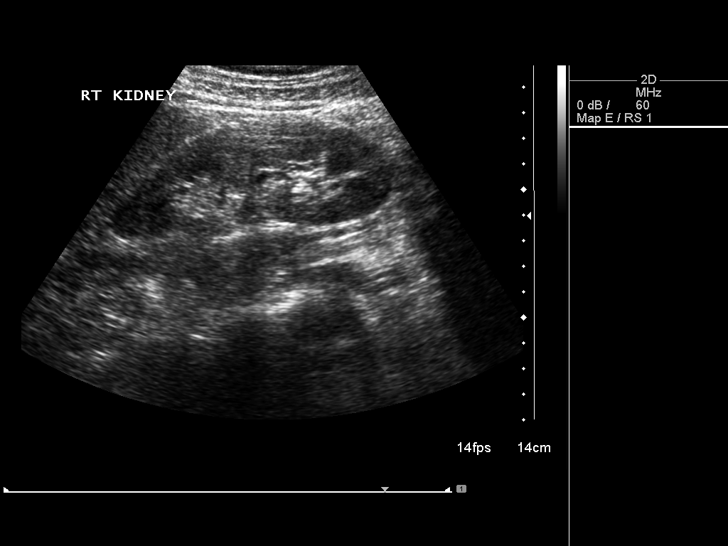
[im 74/99]
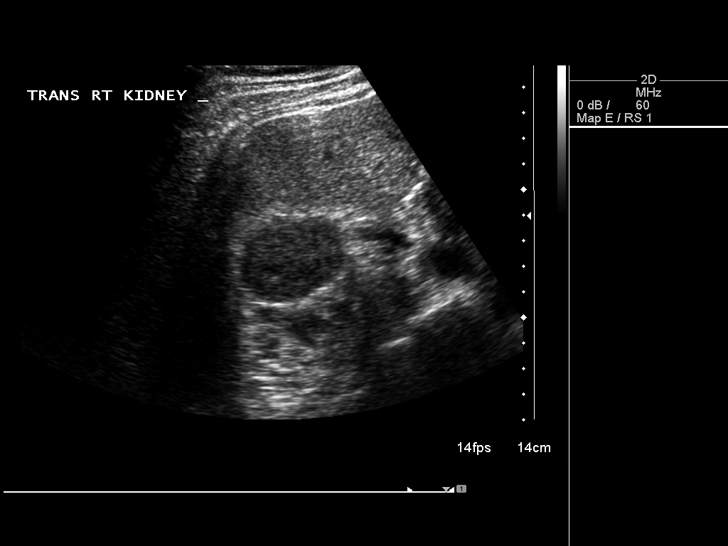
[im 82/99]
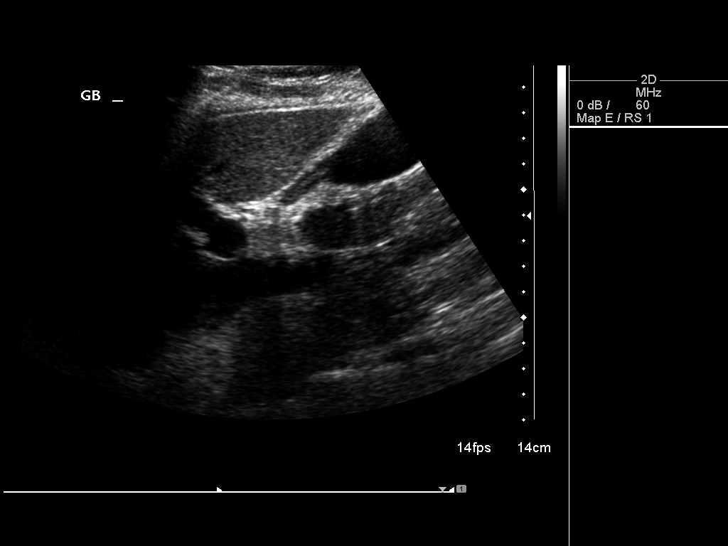
[im 90/99]
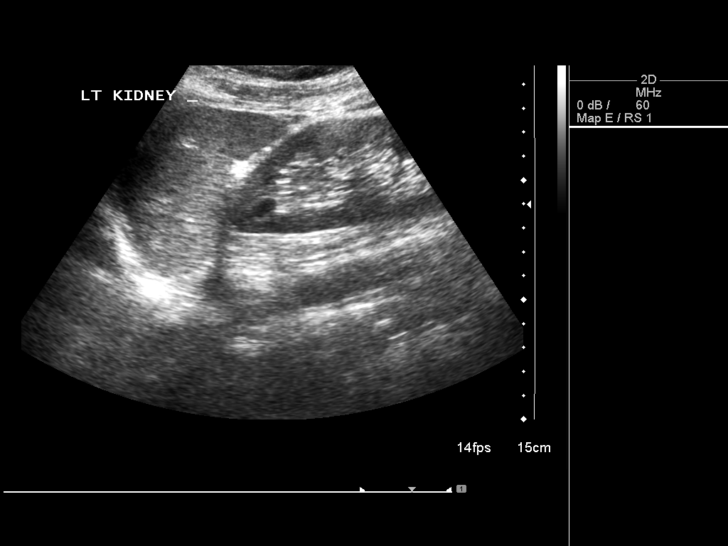
[im 99/99]
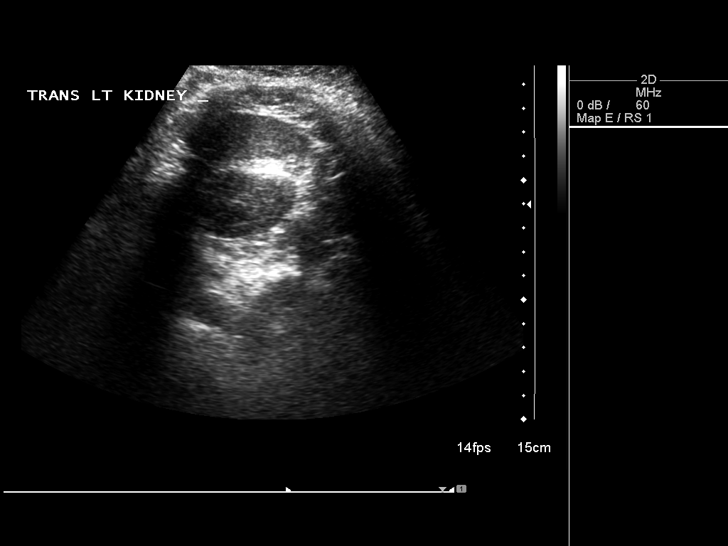

[13 of 25 positions shown; findings below may reference images not displayed]

FINDINGS: Gallbladder: No gallstones or wall thickening visualized. No
sonographic Murphy sign noted by sonographer.

Common bile duct: Diameter: 4.4 mm

Liver: The hepatic echotexture is normal. There is no focal mass nor
ductal dilation.

IVC: No abnormality visualized.

Pancreas: Visualized portion unremarkable.

Spleen: Size and appearance within normal limits.

Right Kidney: Length: 11.4 cm. There is no hydronephrosis. The renal
cortical echotexture is normal. There is an echogenic shadowing
nonobstructing 7 mm stone in the mid to lower pole.

Left Kidney: Length: 11 cm. Echogenicity within normal limits. No
mass or hydronephrosis visualized.

Abdominal aorta: No aneurysm visualized.

Other findings: There is no ascites.
IMPRESSION: 1. No acute hepatobiliary abnormality is observed. No gallstones are
demonstrated. If there are clinical concerns of gallbladder
dysfunction, a nuclear medicine hepatobiliary scan may be useful.
2. 7 mm nonobstructing mid to lower pole right sided kidney stone.
3. No acute intra-abdominal abnormality is observed.

## 2015-12-24 ENCOUNTER — Other Ambulatory Visit: Payer: Self-pay | Admitting: Internal Medicine

## 2015-12-24 ENCOUNTER — Ambulatory Visit
Admission: RE | Admit: 2015-12-24 | Discharge: 2015-12-24 | Disposition: A | Discharge status: Home / Self Care | Payer: BLUE CROSS/BLUE SHIELD | Source: Ambulatory Visit

## 2015-12-24 DIAGNOSIS — Z1231 Encounter for screening mammogram for malignant neoplasm of breast: Secondary | ICD-10-CM

## 2015-12-24 IMAGING — MG 2D DIGITAL SCREENING BILATERAL MAMMOGRAM WITH CAD AND ADJUNCT TO
9 of 13 series · 9 of 29 positions shown · non-contrast
Comparison: Previous exam(s).

CLINICAL DATA: Screening.

EXAM:
2D DIGITAL SCREENING BILATERAL MAMMOGRAM WITH CAD AND ADJUNCT TOMO

[R CC (1 of 2)]
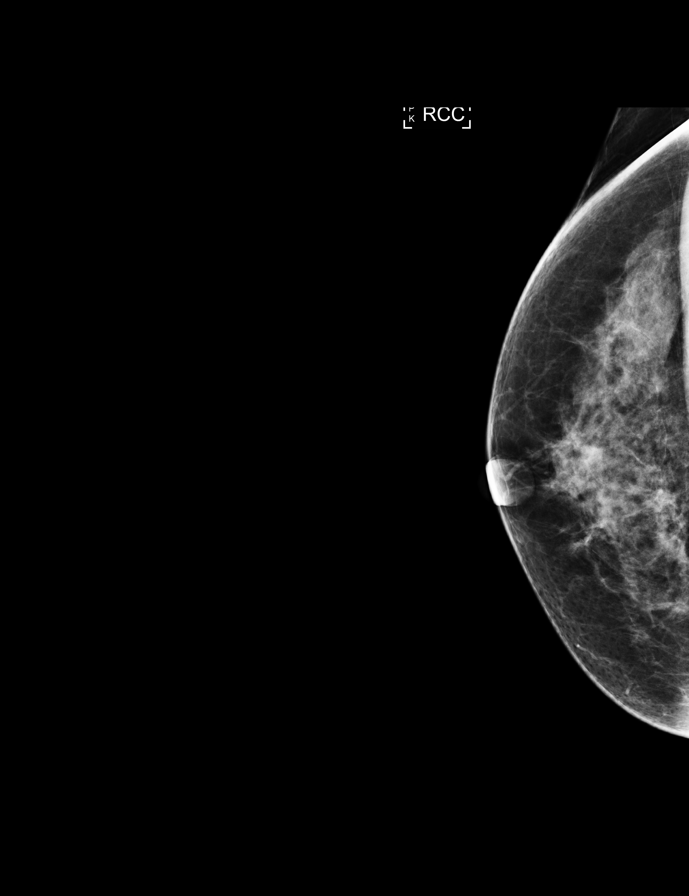

[R CC (2 of 2)]
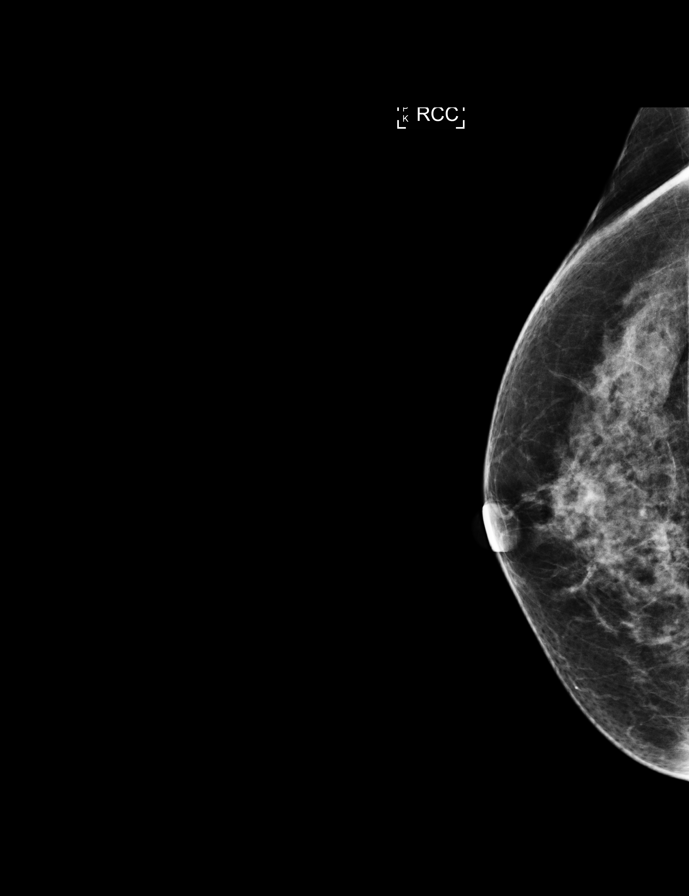

[L MLO synth-2D]
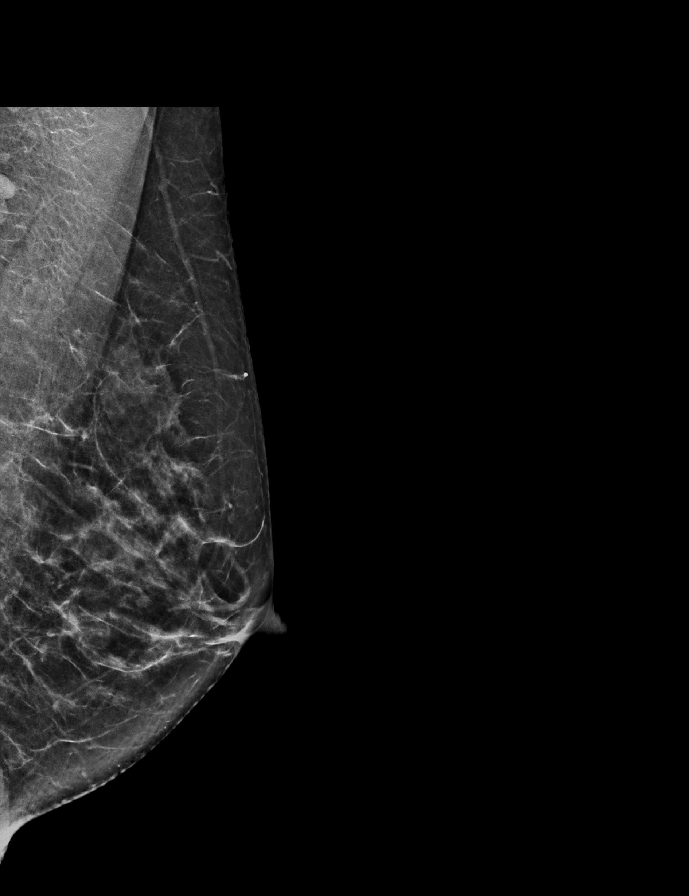

[L CC]
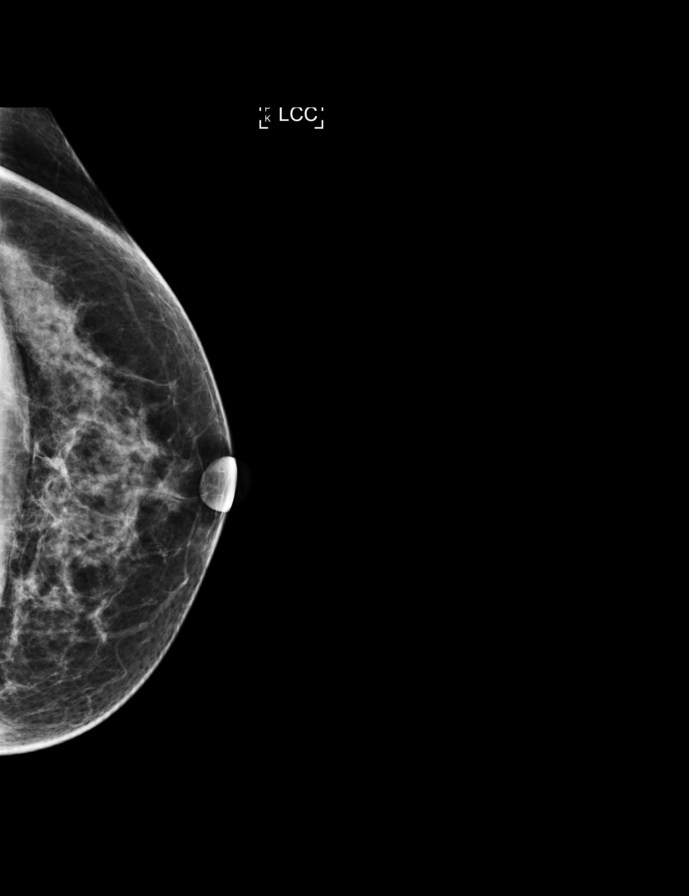

[R MLO synth-2D]
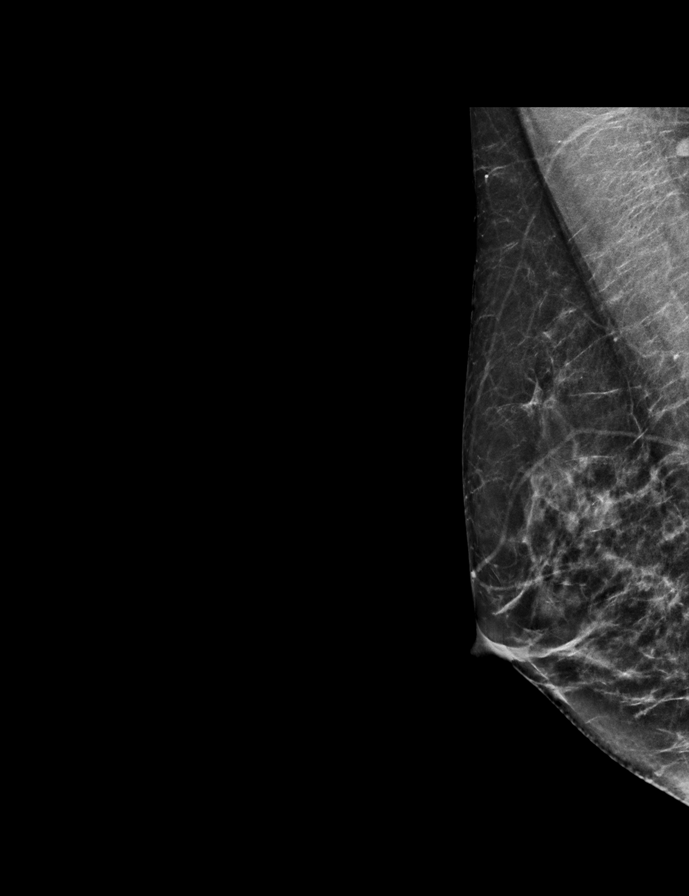

[R CC synth-2D]
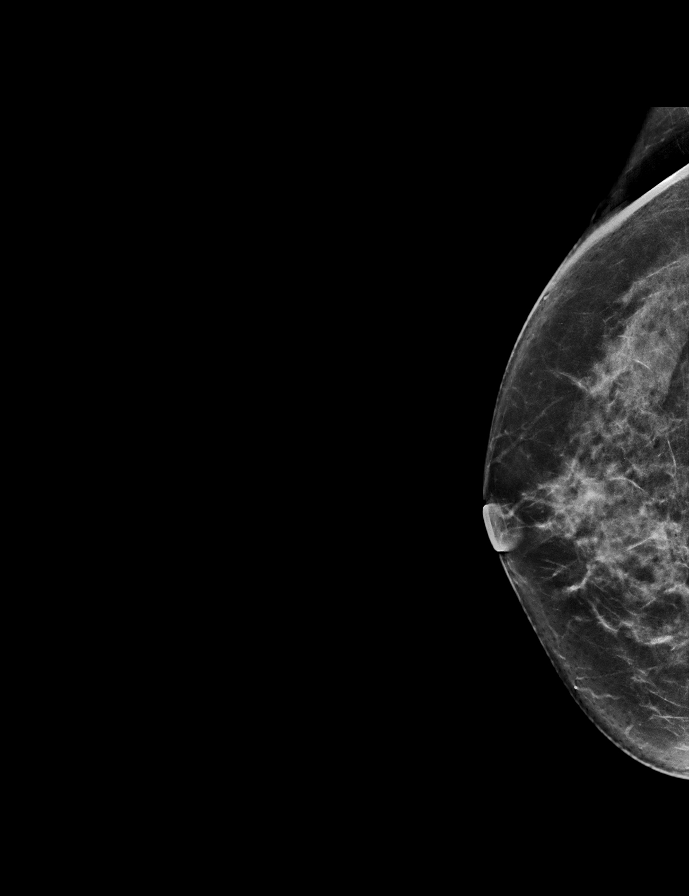

[L MLO]
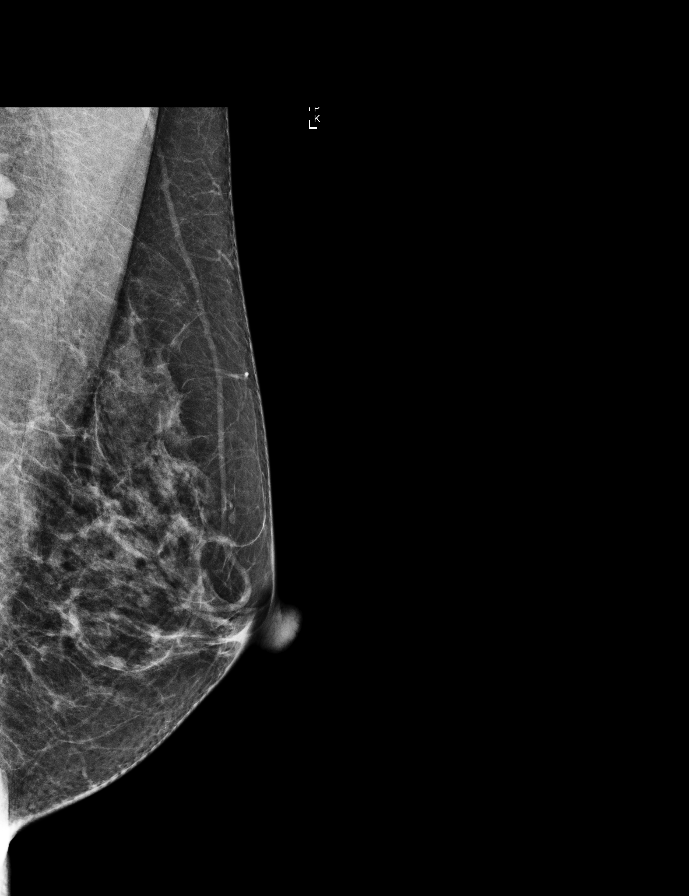

[R MLO]
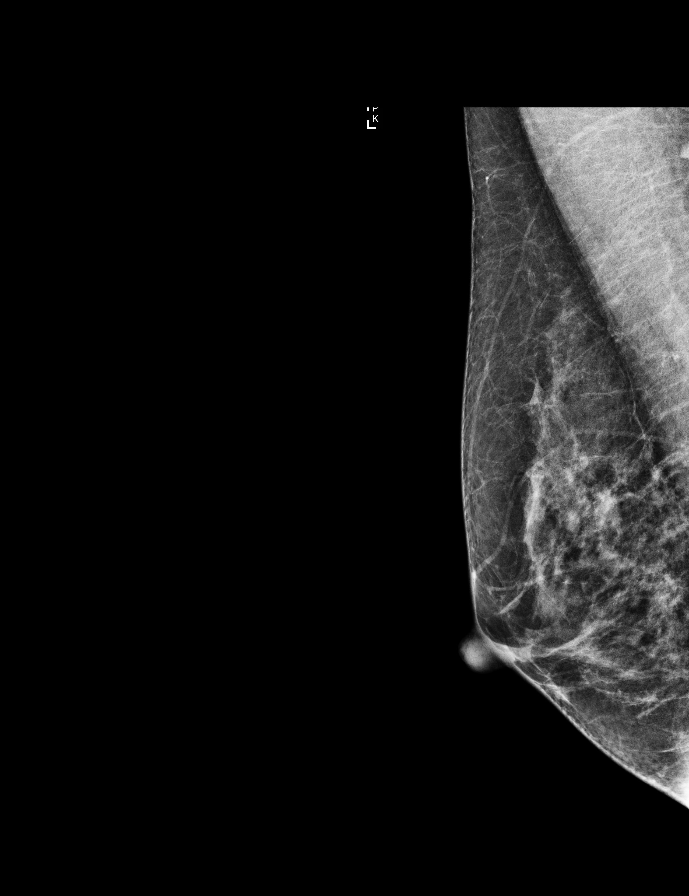

[L CC synth-2D]
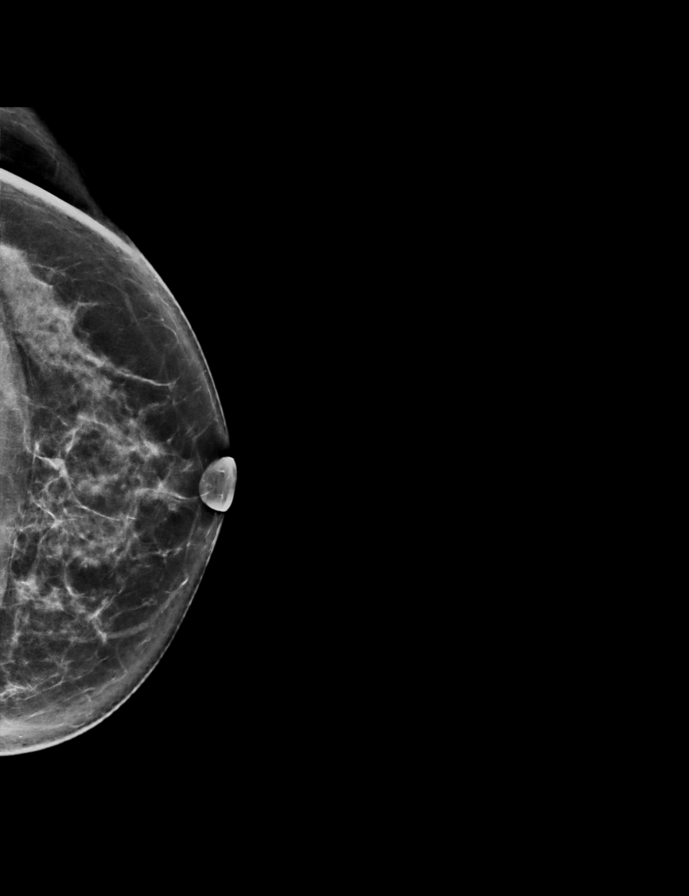

[9 of 29 positions shown; findings below may reference images not displayed]

ACR Breast Density Category c: The breast tissue is heterogeneously
dense, which may obscure small masses.
FINDINGS: There are no findings suspicious for malignancy. Images were
processed with CAD.
IMPRESSION: No mammographic evidence of malignancy. A result letter of this
screening mammogram will be mailed directly to the patient.

RECOMMENDATION:
Screening mammogram in one year. (Code:[TA])

BI-RADS CATEGORY  1: Negative.

## 2017-06-18 ENCOUNTER — Other Ambulatory Visit: Payer: Self-pay | Admitting: Internal Medicine

## 2017-06-18 DIAGNOSIS — Z1231 Encounter for screening mammogram for malignant neoplasm of breast: Secondary | ICD-10-CM

## 2017-06-19 ENCOUNTER — Ambulatory Visit
Admission: RE | Admit: 2017-06-19 | Discharge: 2017-06-19 | Disposition: A | Discharge status: Home / Self Care | Payer: BLUE CROSS/BLUE SHIELD | Source: Ambulatory Visit | Attending: Internal Medicine | Admitting: Internal Medicine

## 2017-06-19 DIAGNOSIS — Z1231 Encounter for screening mammogram for malignant neoplasm of breast: Secondary | ICD-10-CM

## 2017-06-19 IMAGING — MG 2D DIGITAL SCREENING BILATERAL MAMMOGRAM WITH CAD AND ADJUNCT TO
9 of 12 series · 9 of 28 positions shown · non-contrast
Comparison: Previous exam(s).

CLINICAL DATA: Screening.

EXAM:
2D DIGITAL SCREENING BILATERAL MAMMOGRAM WITH CAD AND ADJUNCT TOMO

[L MLO]
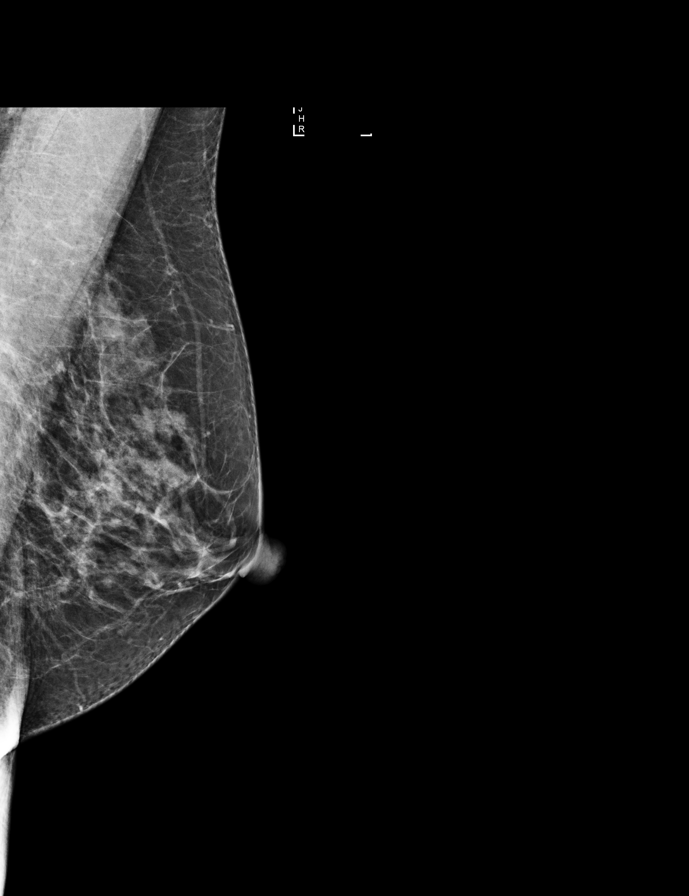

[R MLO]
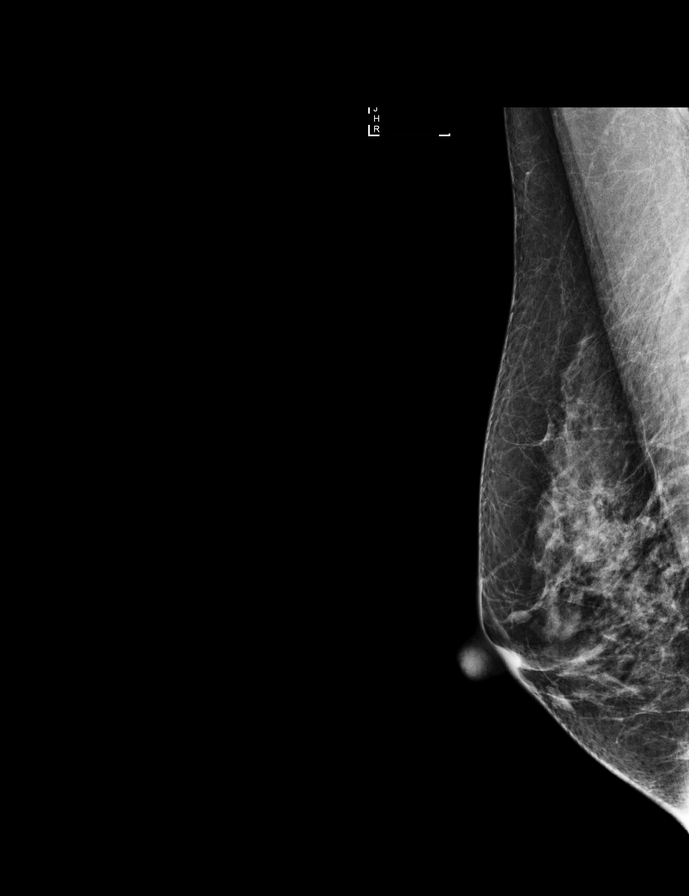

[L CC]
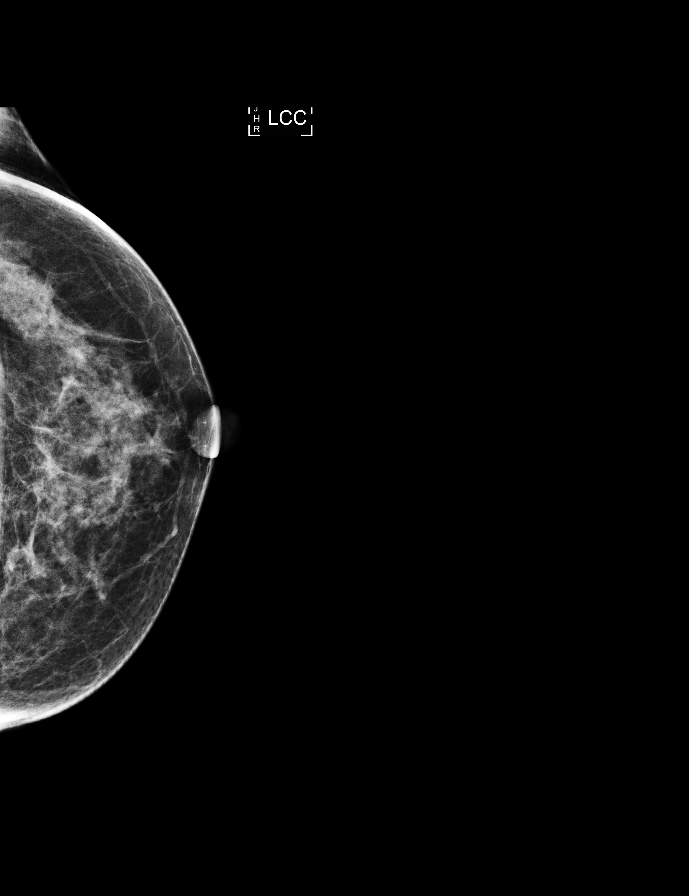

[L CC synth-2D]
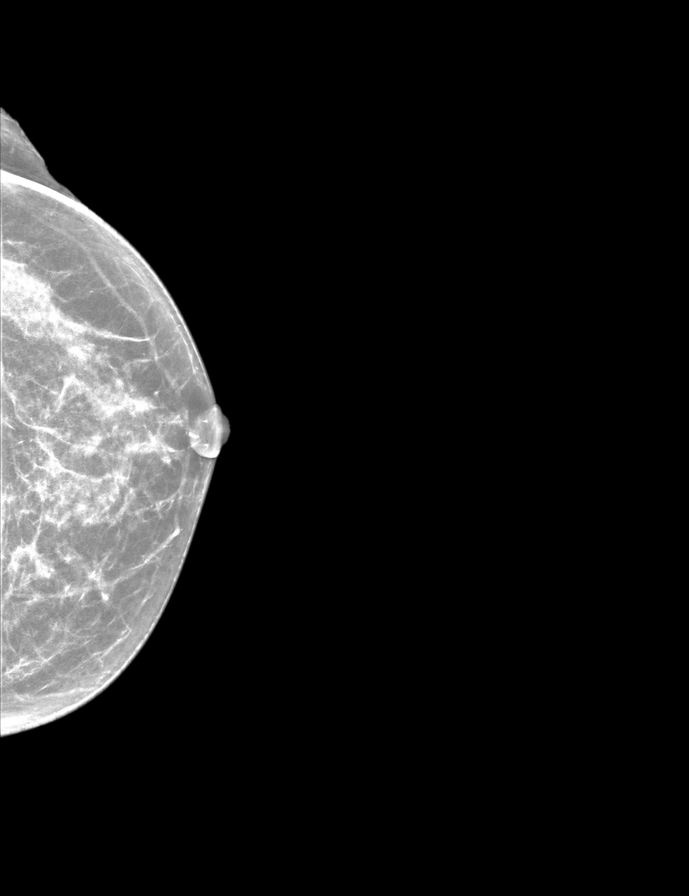

[R CC]
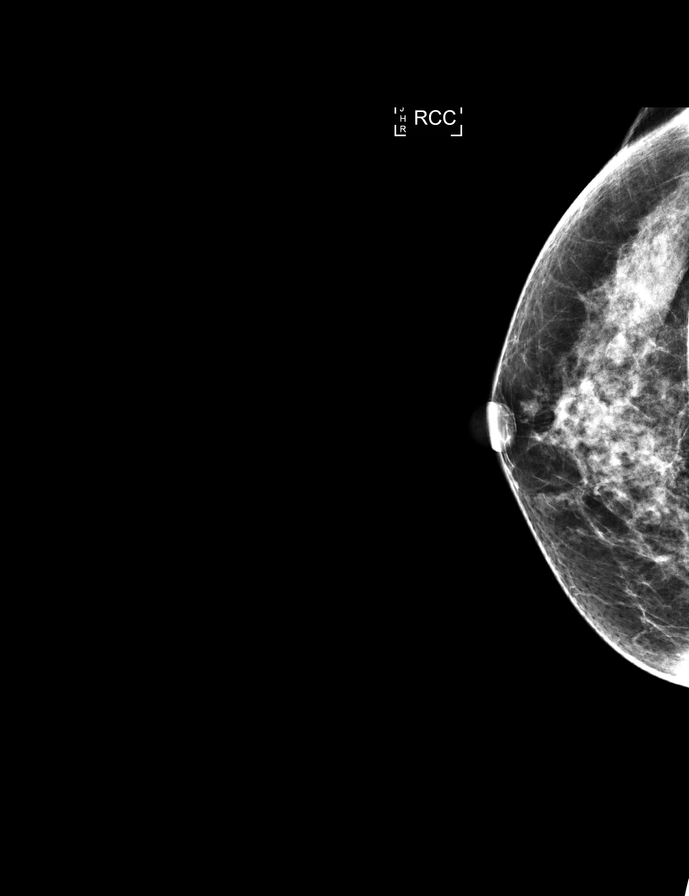

[L MLO synth-2D]
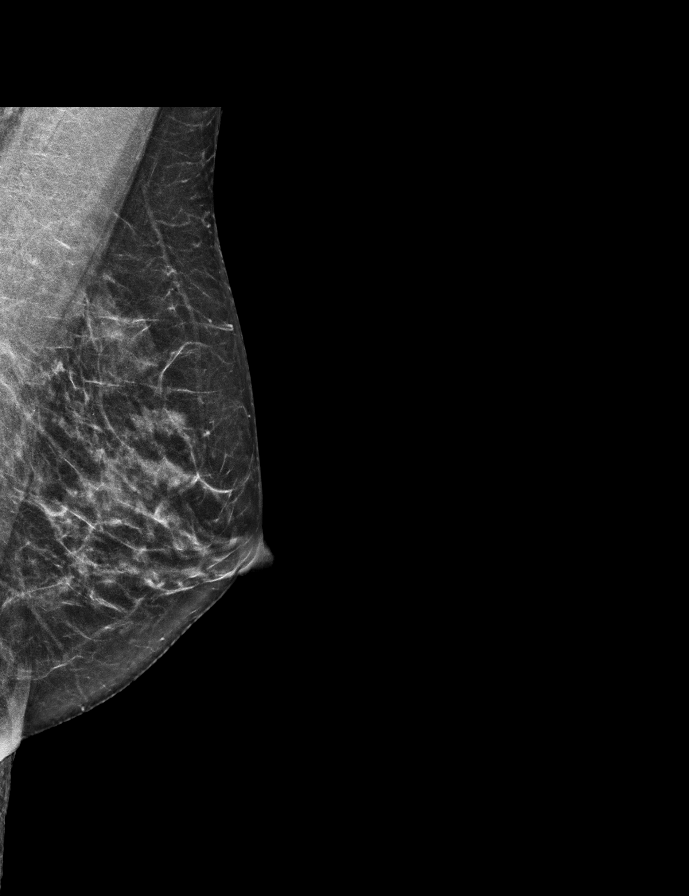

[R MLO synth-2D]
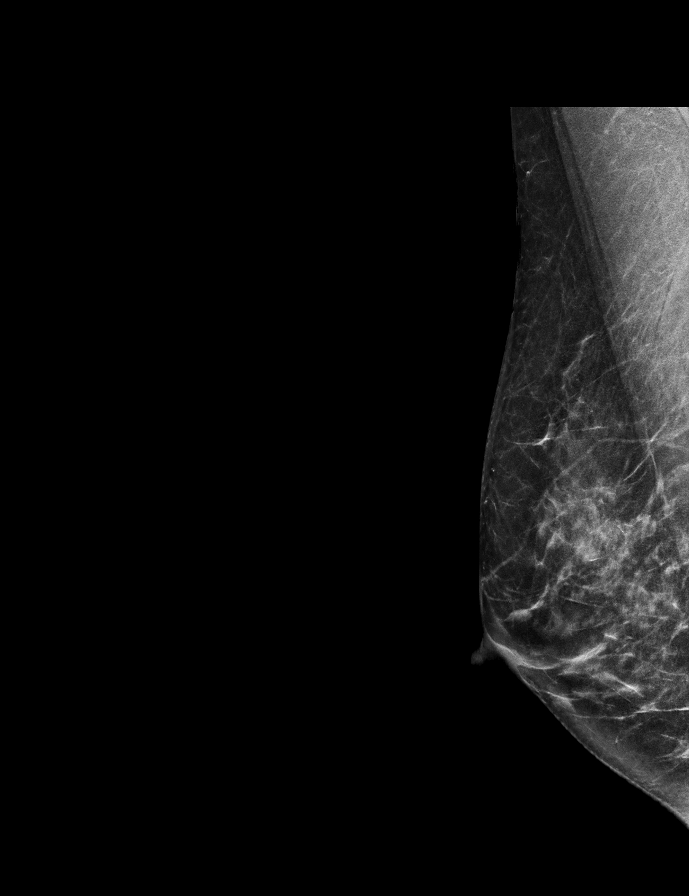

[R CC synth-2D]
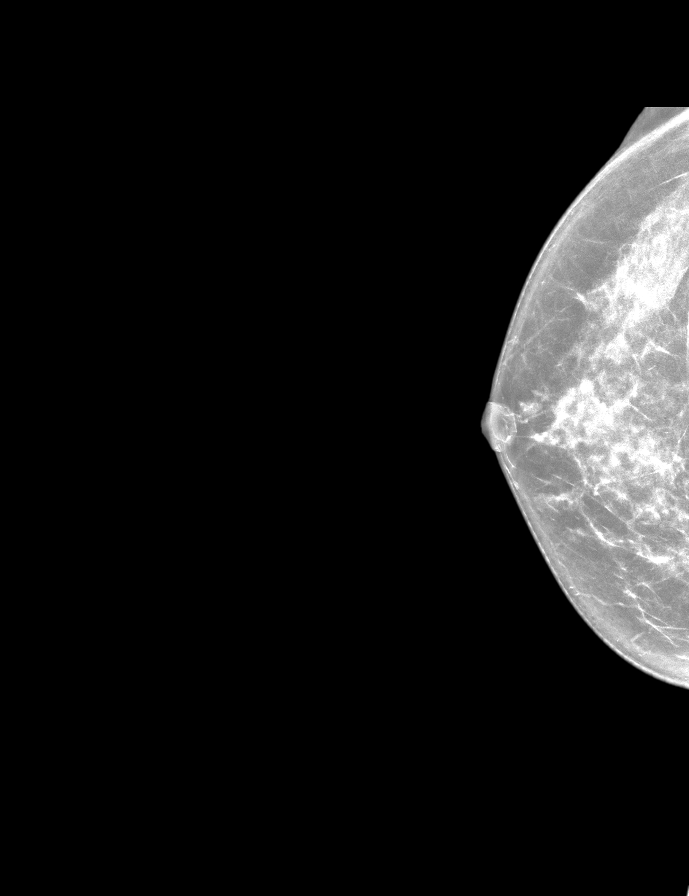

[L CC tomo · tomo slice 27/53.0]
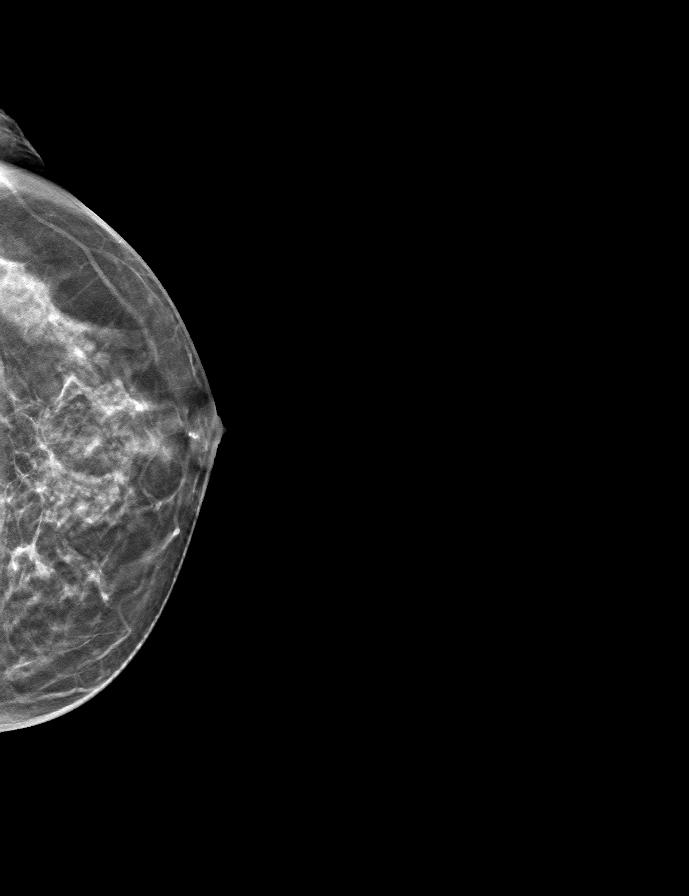

[9 of 28 positions shown; findings below may reference images not displayed]

ACR Breast Density Category c: The breast tissue is heterogeneously
dense, which may obscure small masses.
FINDINGS: There are no findings suspicious for malignancy. Images were
processed with CAD.
IMPRESSION: No mammographic evidence of malignancy. A result letter of this
screening mammogram will be mailed directly to the patient.

RECOMMENDATION:
Screening mammogram in one year. (Code:[TA])

BI-RADS CATEGORY  1: Negative.

## 2018-05-25 ENCOUNTER — Other Ambulatory Visit: Payer: Self-pay | Admitting: Gastroenterology

## 2018-06-11 ENCOUNTER — Other Ambulatory Visit: Payer: Self-pay | Admitting: Gastroenterology

## 2018-06-11 DIAGNOSIS — R195 Other fecal abnormalities: Secondary | ICD-10-CM

## 2018-06-24 ENCOUNTER — Encounter (HOSPITAL_COMMUNITY): Admission: RE | Payer: Self-pay | Source: Ambulatory Visit

## 2018-06-24 ENCOUNTER — Ambulatory Visit
Admission: RE | Admit: 2018-06-24 | Discharge: 2018-06-24 | Disposition: A | Discharge status: Home / Self Care | Payer: BLUE CROSS/BLUE SHIELD | Source: Ambulatory Visit | Attending: Gastroenterology | Admitting: Gastroenterology

## 2018-06-24 ENCOUNTER — Ambulatory Visit (HOSPITAL_COMMUNITY)
Admission: RE | Admit: 2018-06-24 | Payer: BLUE CROSS/BLUE SHIELD | Source: Ambulatory Visit | Admitting: Gastroenterology

## 2018-06-24 DIAGNOSIS — R195 Other fecal abnormalities: Secondary | ICD-10-CM

## 2018-06-24 SURGERY — COLONOSCOPY WITH PROPOFOL
Anesthesia: Monitor Anesthesia Care

## 2019-03-08 ENCOUNTER — Other Ambulatory Visit: Payer: Self-pay | Admitting: Internal Medicine

## 2019-03-08 DIAGNOSIS — Z1231 Encounter for screening mammogram for malignant neoplasm of breast: Secondary | ICD-10-CM

## 2019-04-20 ENCOUNTER — Other Ambulatory Visit: Payer: Self-pay

## 2019-04-20 ENCOUNTER — Ambulatory Visit
Admission: RE | Admit: 2019-04-20 | Discharge: 2019-04-20 | Disposition: A | Discharge status: Home / Self Care | Payer: BC Managed Care – PPO | Source: Ambulatory Visit | Attending: Internal Medicine | Admitting: Internal Medicine

## 2019-04-20 DIAGNOSIS — Z1231 Encounter for screening mammogram for malignant neoplasm of breast: Secondary | ICD-10-CM

## 2019-04-20 IMAGING — MG MM DIGITAL SCREENING BILAT W/ TOMO W/ CAD
8 series · 9 of 24 positions shown · non-contrast
Comparison: Previous exam(s).

CLINICAL DATA: Screening.

EXAM:
DIGITAL SCREENING BILATERAL MAMMOGRAM WITH TOMO AND CAD

[L CC synth-2D]
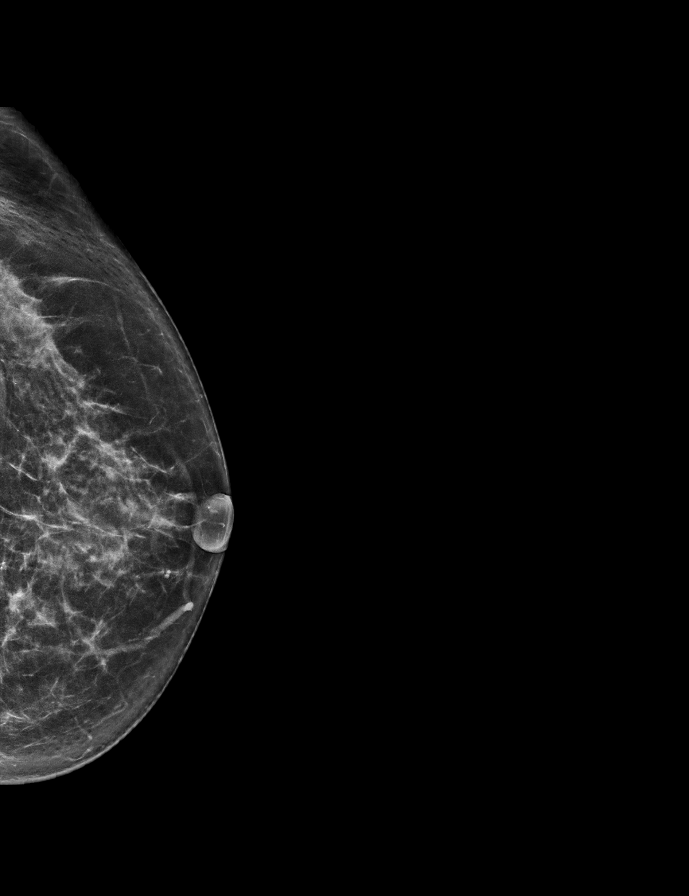

[R CC synth-2D]
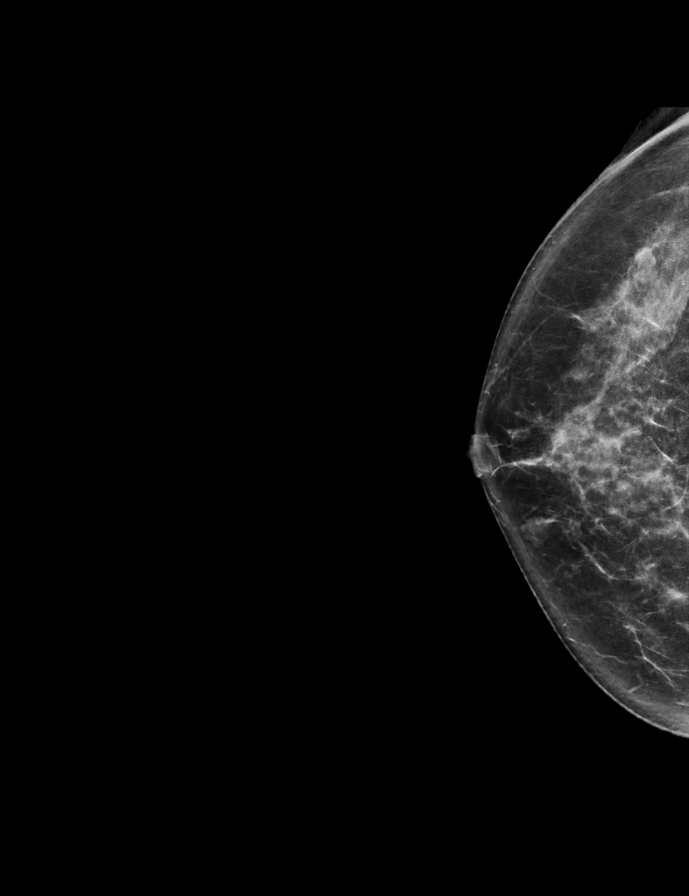

[R MLO synth-2D]
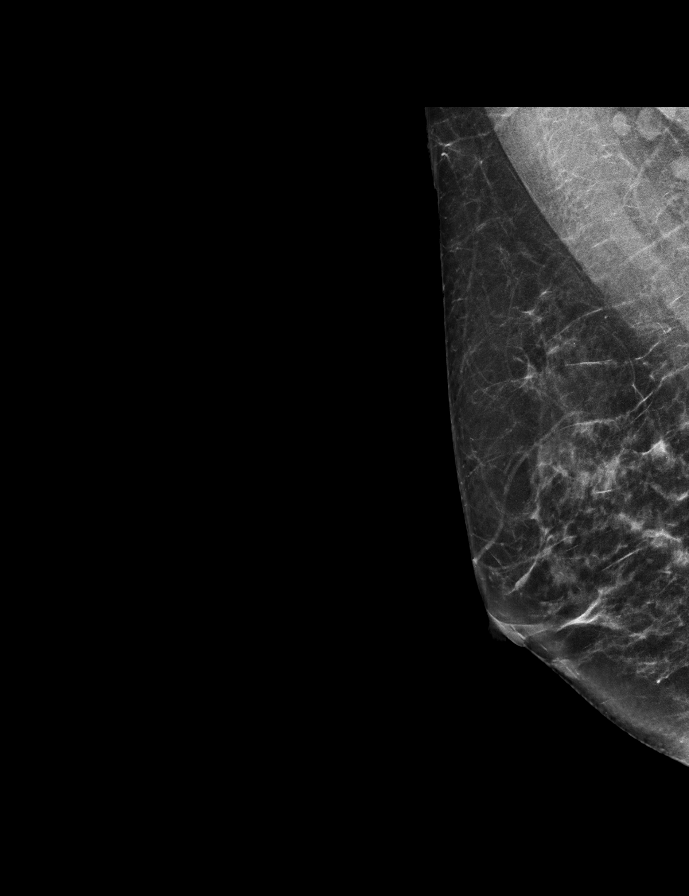

[L MLO synth-2D]
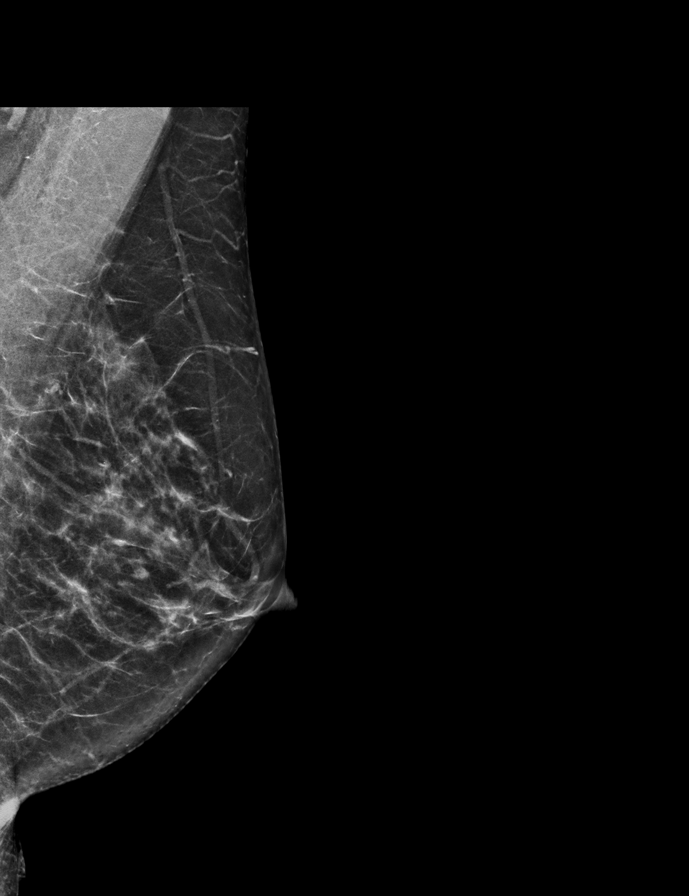

[R CC tomo · 2 of 68 frames shown]
[frame 22/68]
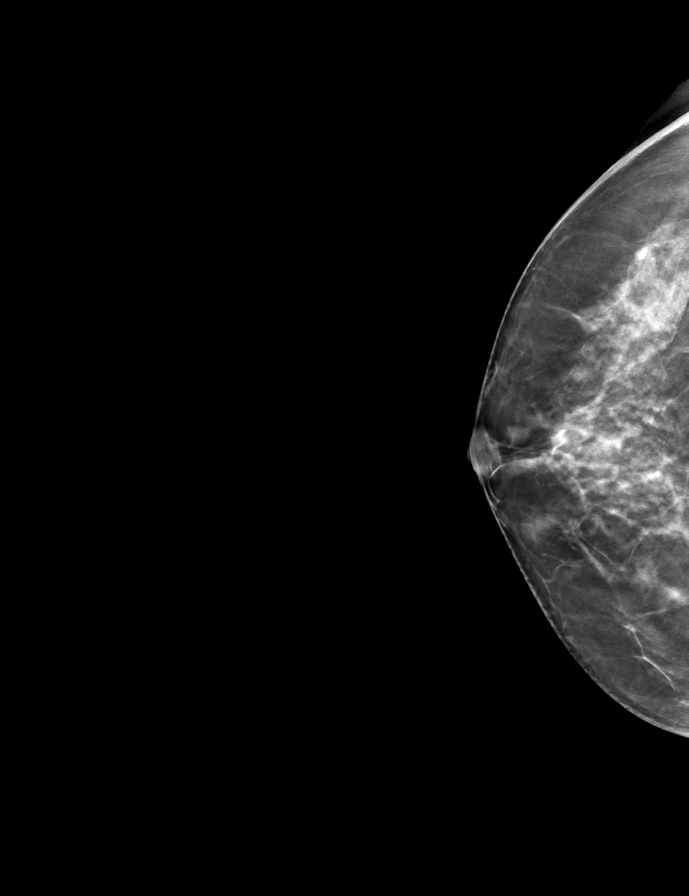
[frame 35/68]
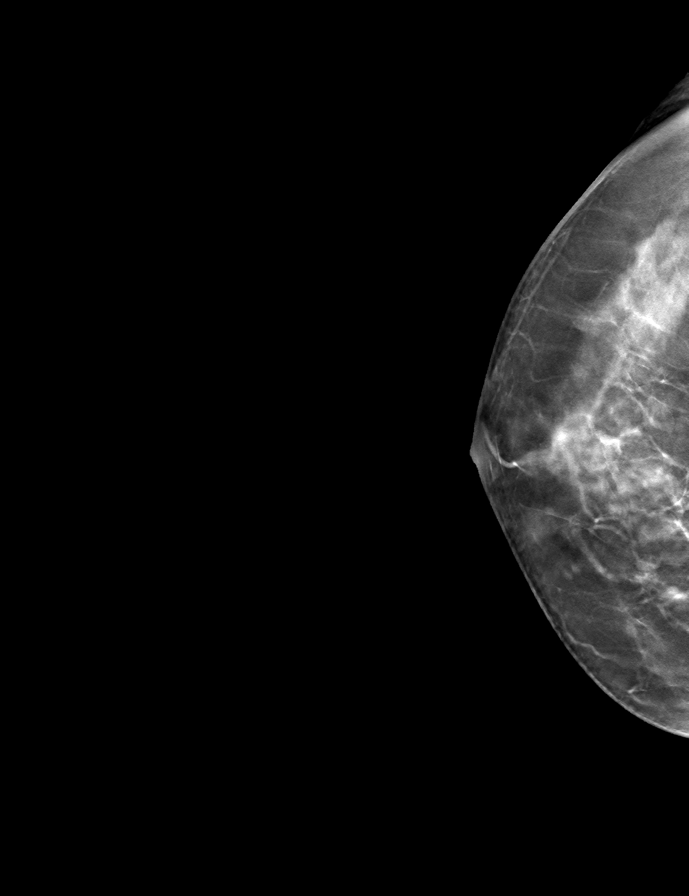

[L CC tomo · tomo slice 33/64.0]
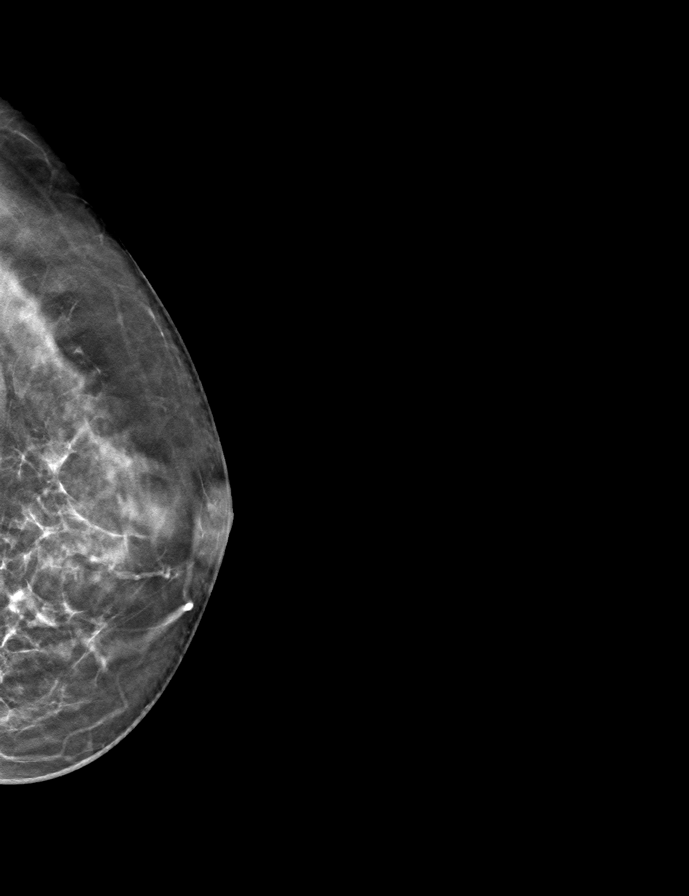

[R MLO tomo · tomo slice 31/62.0]
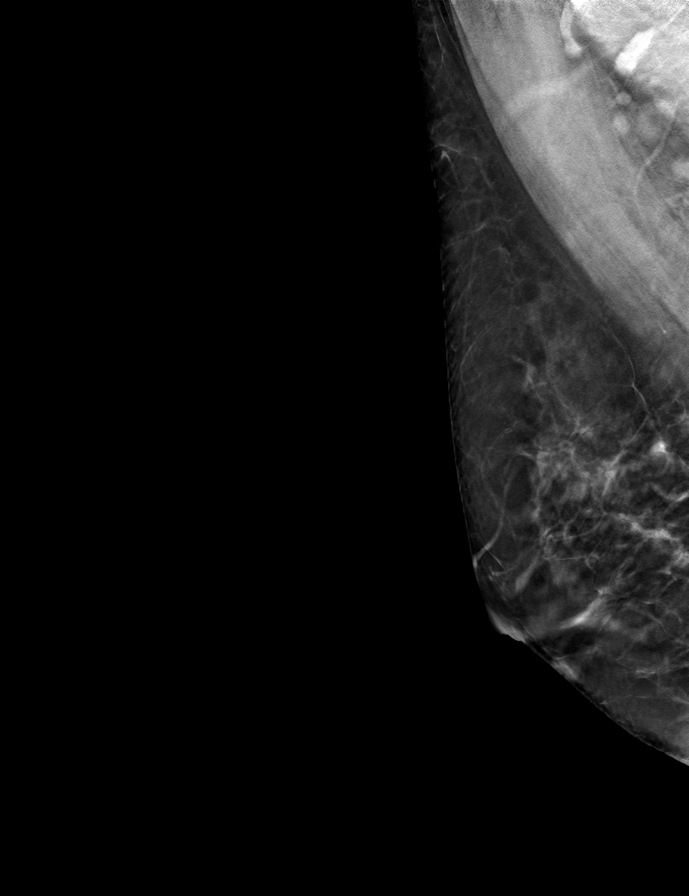

[L MLO tomo · tomo slice 29/58.0]
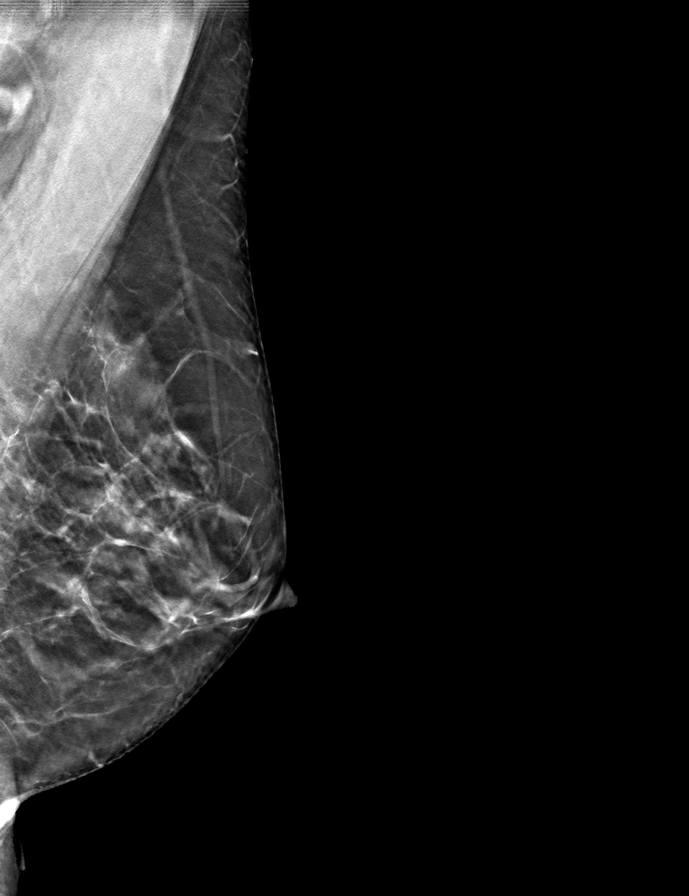

[9 of 24 positions shown; findings below may reference images not displayed]

ACR Breast Density Category c: The breast tissue is heterogeneously
dense, which may obscure small masses.
FINDINGS: In the right breast, 2 possible masses warrant further evaluation.
In the left breast, no findings suspicious for malignancy. Images
were processed with CAD.
IMPRESSION: Further evaluation is suggested for 2 possible masses in the right
breast.

RECOMMENDATION:
Diagnostic mammogram and possibly ultrasound of the right breast.
(Code:[K9])

The patient will be contacted regarding the findings, and additional
imaging will be scheduled.

BI-RADS CATEGORY  0: Incomplete. Need additional imaging evaluation
and/or prior mammograms for comparison.

## 2019-04-21 ENCOUNTER — Other Ambulatory Visit: Payer: Self-pay | Admitting: Internal Medicine

## 2019-04-21 DIAGNOSIS — R928 Other abnormal and inconclusive findings on diagnostic imaging of breast: Secondary | ICD-10-CM

## 2019-04-27 ENCOUNTER — Other Ambulatory Visit: Payer: Self-pay

## 2019-04-27 ENCOUNTER — Other Ambulatory Visit: Payer: Self-pay | Admitting: Internal Medicine

## 2019-04-27 ENCOUNTER — Ambulatory Visit
Admission: RE | Admit: 2019-04-27 | Discharge: 2019-04-27 | Disposition: A | Discharge status: Home / Self Care | Payer: BC Managed Care – PPO | Source: Ambulatory Visit | Attending: Internal Medicine | Admitting: Internal Medicine

## 2019-04-27 DIAGNOSIS — N6310 Unspecified lump in the right breast, unspecified quadrant: Secondary | ICD-10-CM

## 2019-04-27 DIAGNOSIS — R928 Other abnormal and inconclusive findings on diagnostic imaging of breast: Secondary | ICD-10-CM

## 2019-04-27 IMAGING — MG MM DIGITAL DIAGNOSTIC UNILAT*R* W/ TOMO W/ CAD
8 series · 8 of 24 positions shown · non-contrast
Comparison: Previous exam(s).

CLINICAL DATA: 53-year-old female for further evaluation of
possible RIGHT breast masses on screening mammogram. Strong family
history of breast cancer, including mother.

EXAM:
DIGITAL DIAGNOSTIC RIGHT MAMMOGRAM WITH CAD AND TOMO
ULTRASOUND RIGHT BREAST

[R MLO synth-2D (1 of 2)]
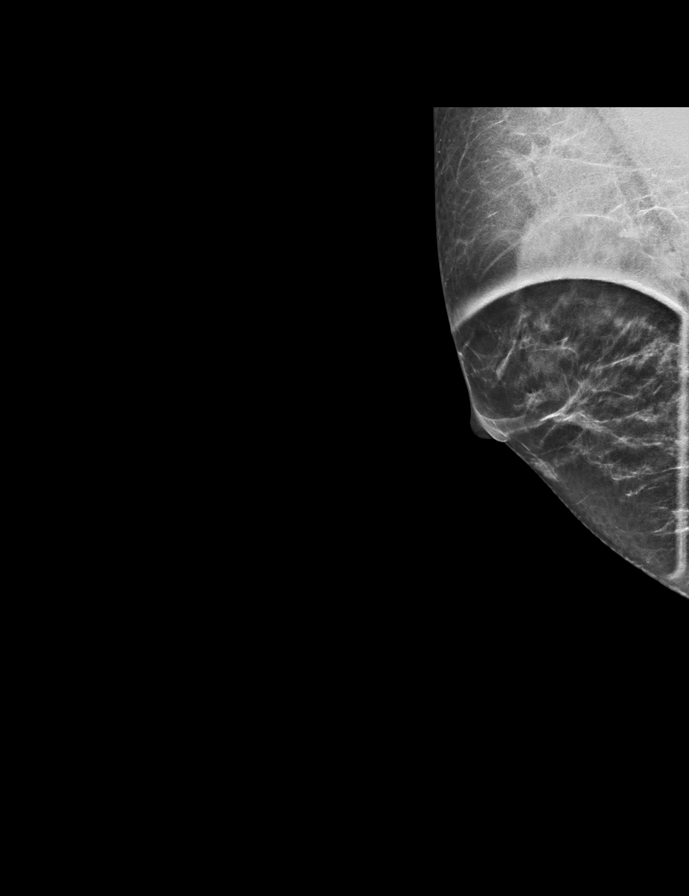

[R CC synth-2D (1 of 2)]
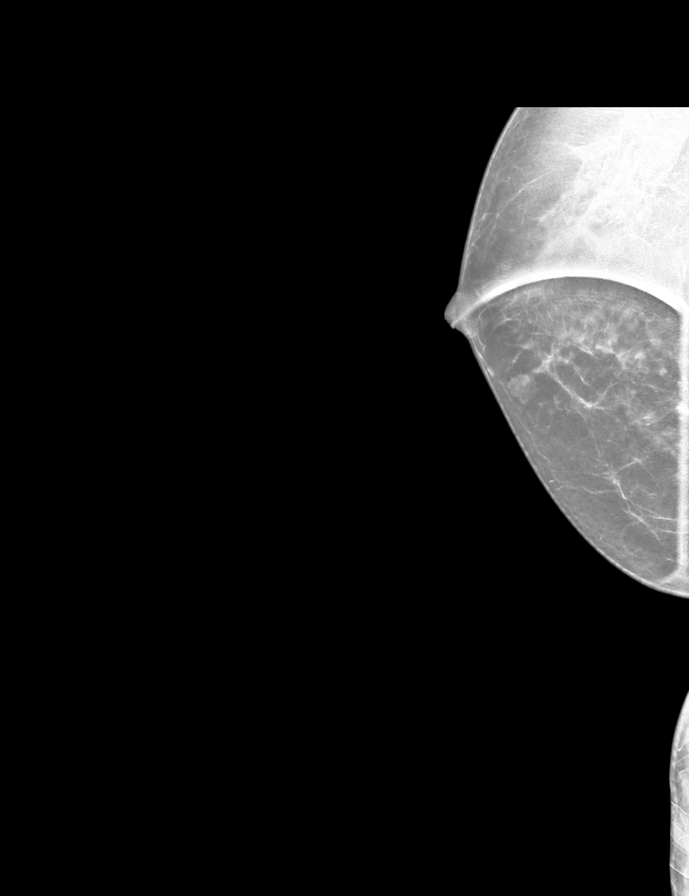

[R MLO synth-2D (2 of 2)]
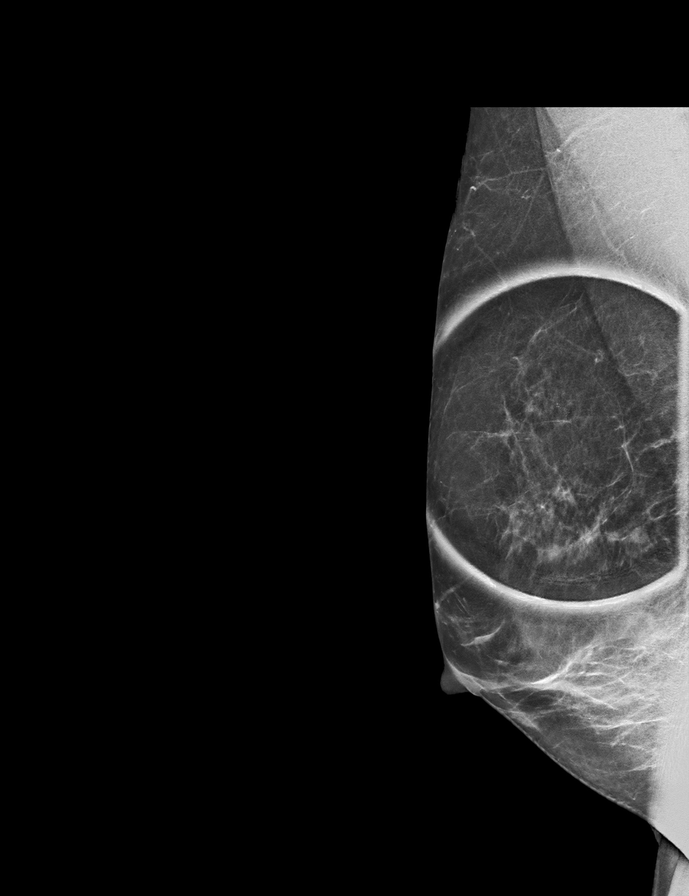

[R CC synth-2D (2 of 2)]
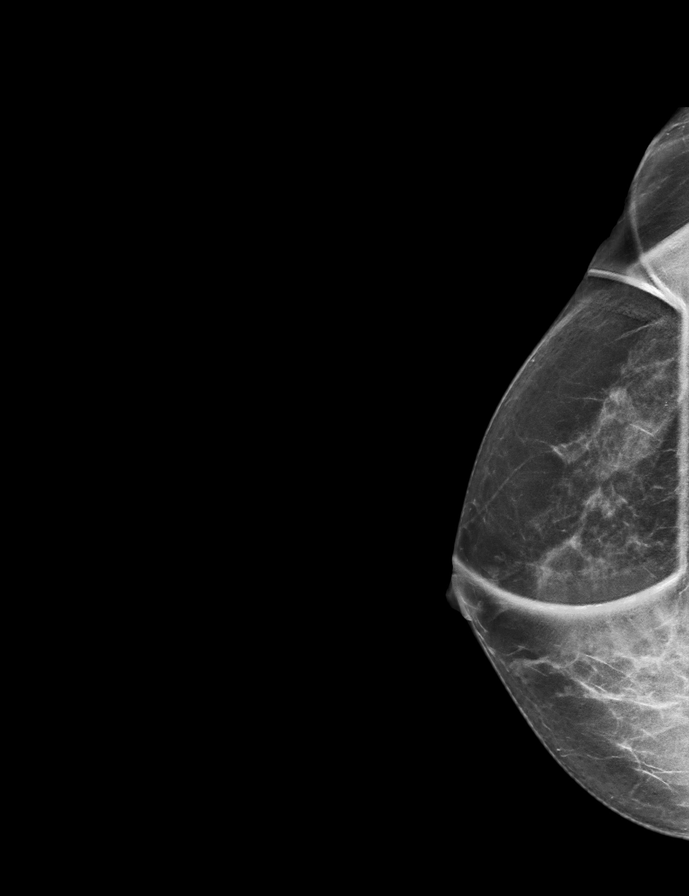

[R CC tomo (1 of 2) · tomo slice 29/58.0]
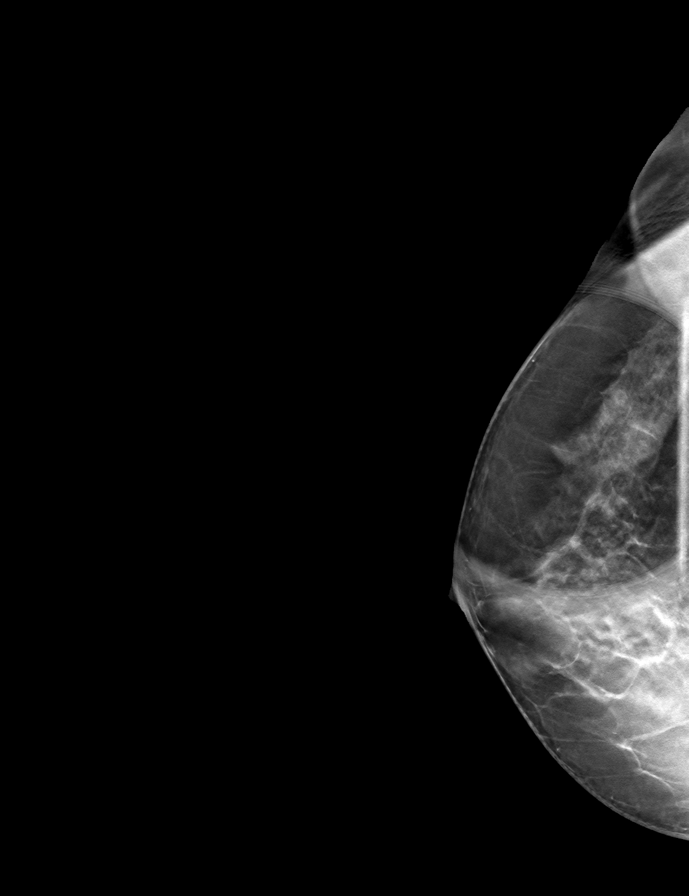

[R MLO tomo (1 of 2) · tomo slice 25/48.0]
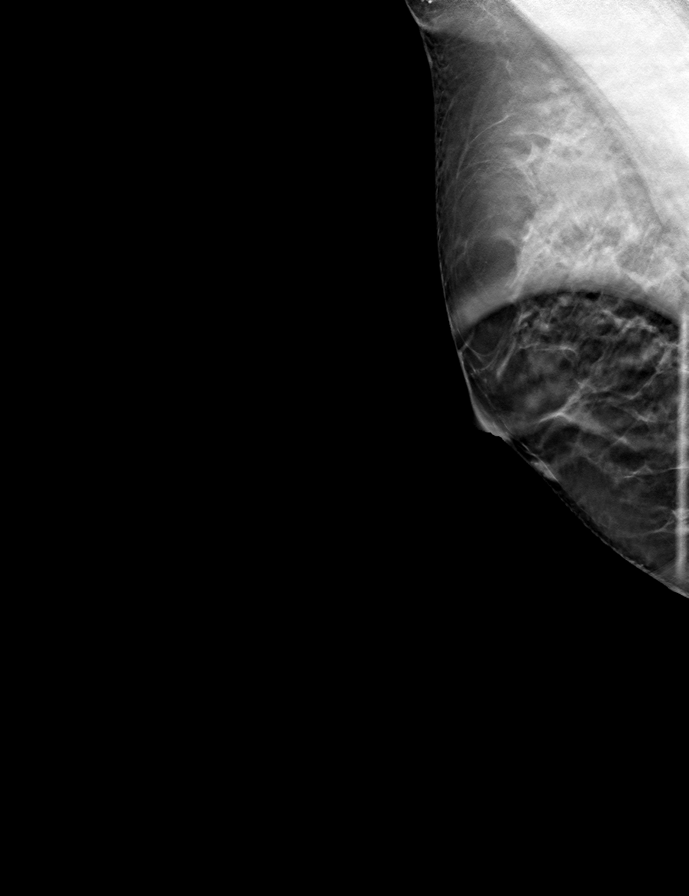

[R MLO tomo (2 of 2) · tomo slice 25/49.0]
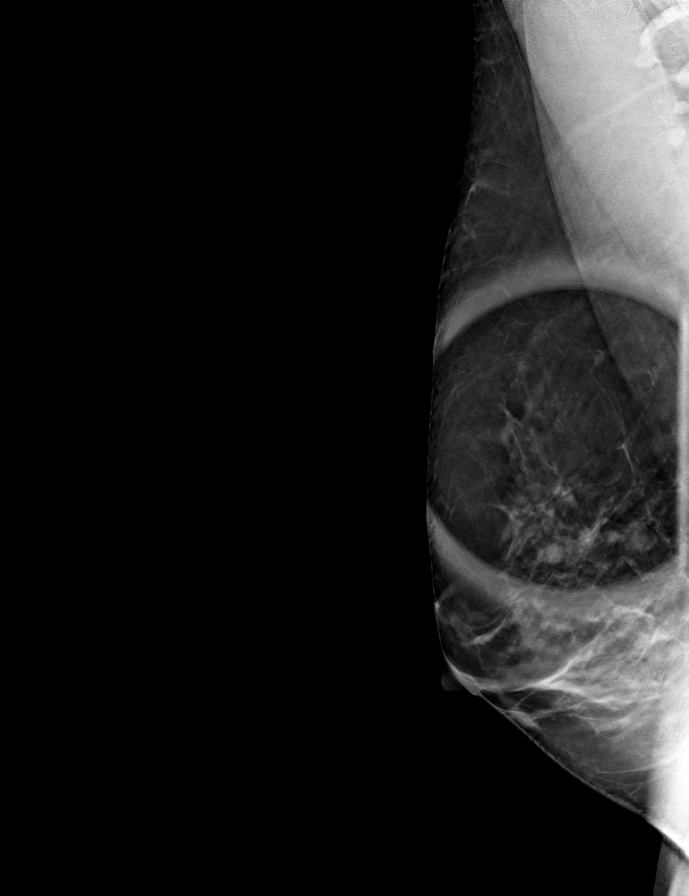

[R CC tomo (2 of 2) · tomo slice 27/53.0]
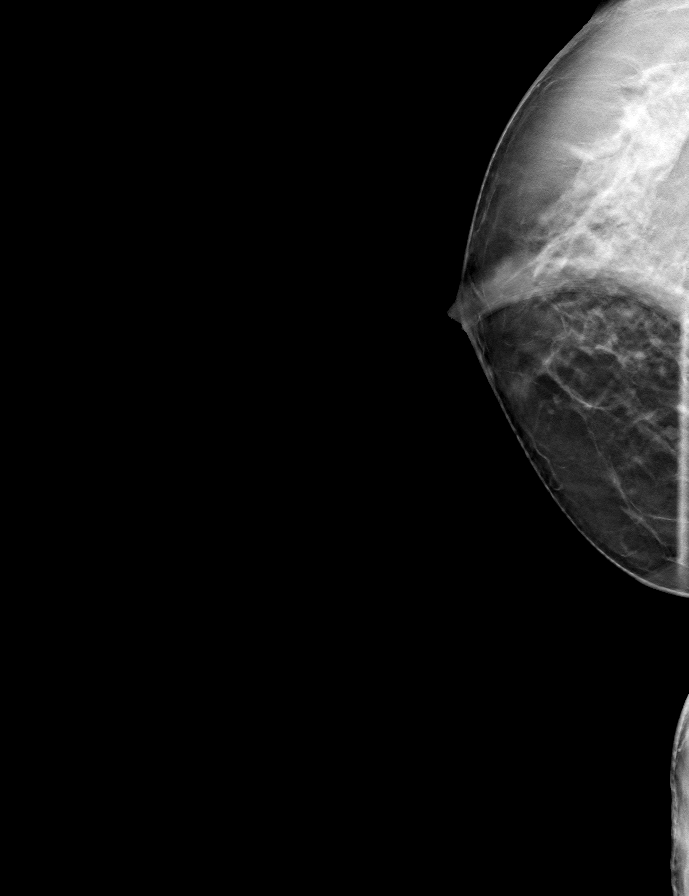

[8 of 24 positions shown; findings below may reference images not displayed]

ACR Breast Density Category c: The breast tissue is heterogeneously
dense, which may obscure small masses.
FINDINGS: 2D/3D spot compression views of the RIGHT breast demonstrate a
persistent circumscribed oval mass within the OUTER RIGHT breast and
persistent lobulated mass within the superficial LOWER OUTER RIGHT
breast.

Targeted ultrasound is performed, showing a 0.5 x 0.4 x 0.7 cm
benign simple cyst at the 9 o'clock position of the RIGHT breast 3
cm from the nipple.

A 0.7 x 0.3 x 0.7 cm slightly ill-defined hypoechoic mass with tiny
cystic spaces noted at the 4 o'clock position of the RIGHT breast 1
cm from the nipple.

No abnormal RIGHT axillary lymph nodes are identified.
IMPRESSION: 1. Indeterminate but low suspicion superficial 0.7 cm mass within
the LOWER INNER RIGHT breast corresponding to 1 of the screening
study findings. Options of short-term follow-up and 6 month
follow-up were discussed with the patient. She strongly desires
tissue sampling given family history.
2. Benign simple cyst in the OUTER RIGHT breast corresponding to the
other screening study finding.

RECOMMENDATION:
Ultrasound-guided biopsy of RIGHT breast mass at the 4 o'clock
position, which will be scheduled.

I have discussed the findings and recommendations with the patient.
If applicable, a reminder letter will be sent to the patient
regarding the next appointment.

BI-RADS CATEGORY  4: Suspicious.

## 2019-04-27 IMAGING — US US BREAST*R* LIMITED INC AXILLA
1 series · 12 of 12 positions shown · non-contrast
Comparison: Previous exam(s).

CLINICAL DATA: 53-year-old female for further evaluation of
possible RIGHT breast masses on screening mammogram. Strong family
history of breast cancer, including mother.

EXAM:
DIGITAL DIAGNOSTIC RIGHT MAMMOGRAM WITH CAD AND TOMO
ULTRASOUND RIGHT BREAST

[Series 1: us breast*right* limited inc axilla · 0.05mm/px · 12 of 12 slices shown]
[im 1/12]
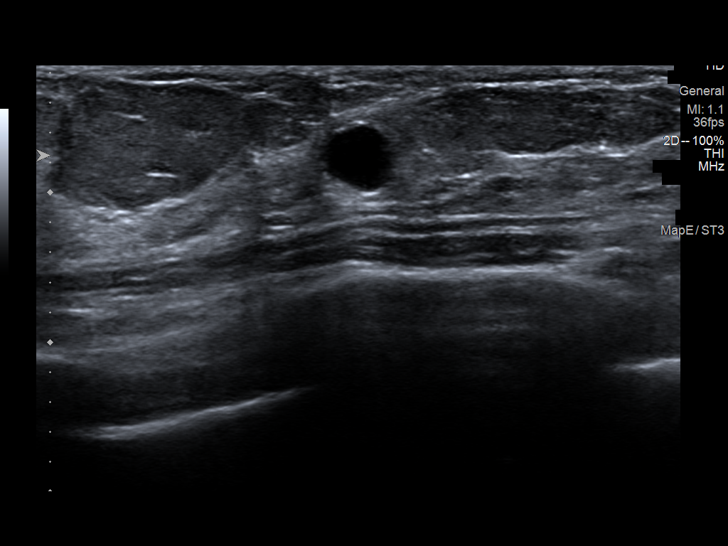
[im 2/12]
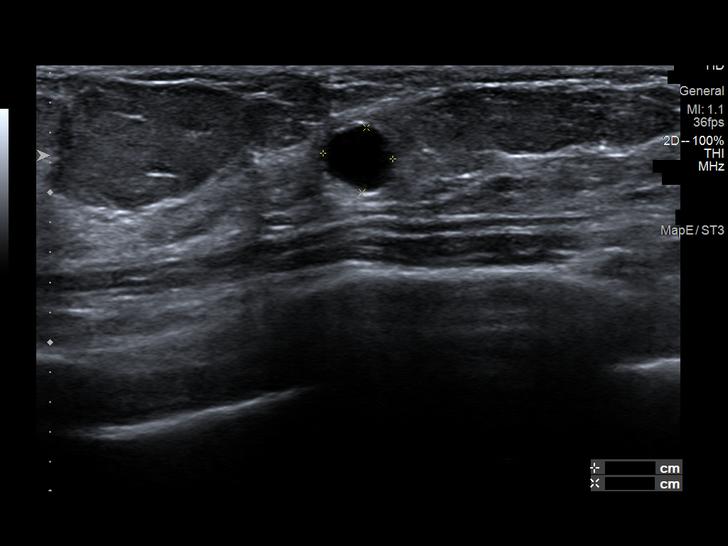
[im 3/12]
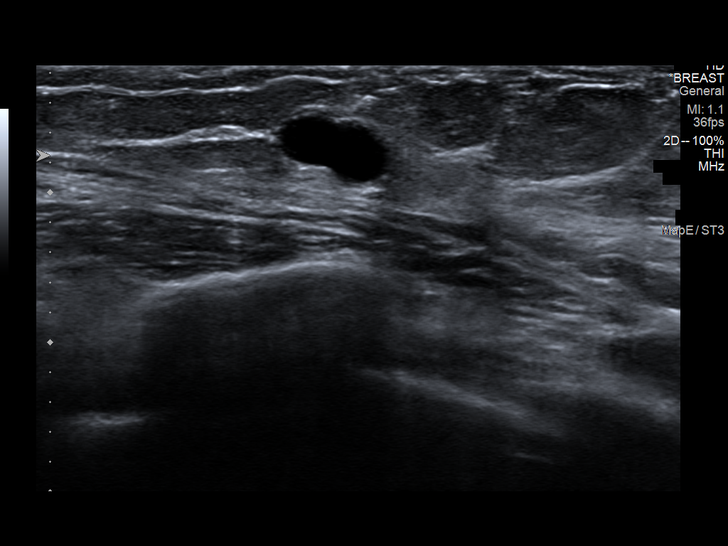
[im 4/12]
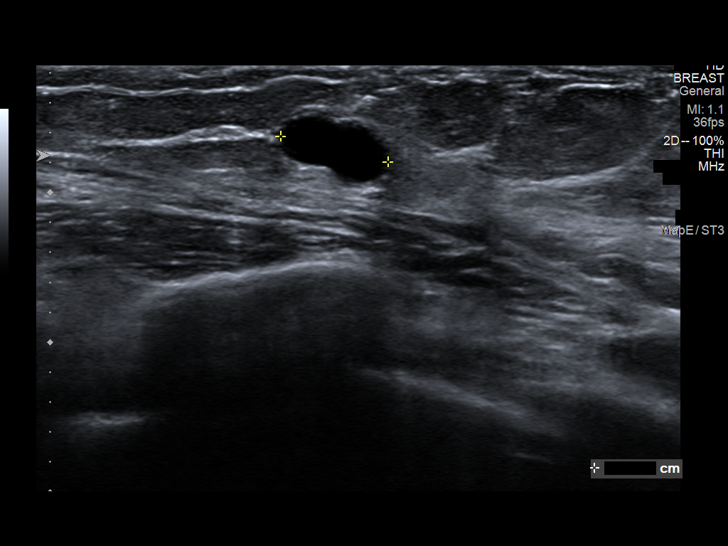
[im 5/12]
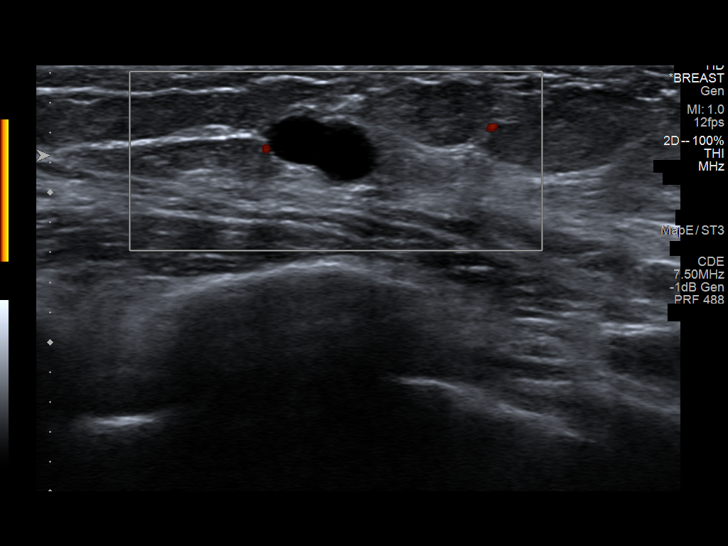
[im 6/12]
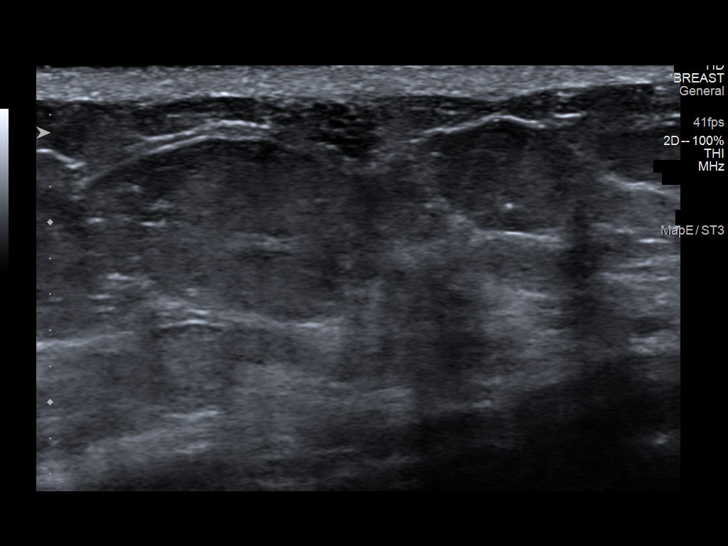
[im 7/12]
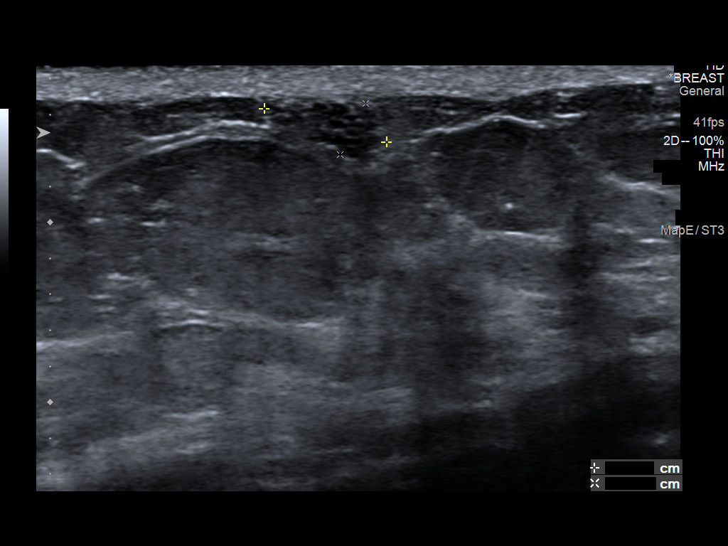
[im 8/12]
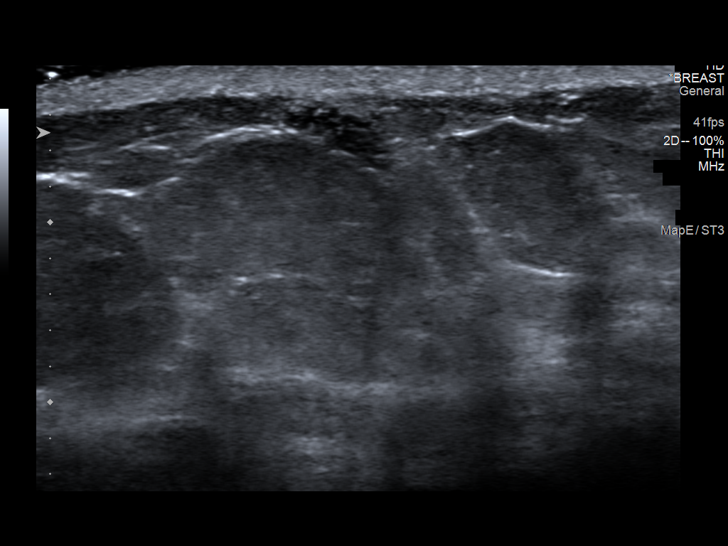
[im 9/12]
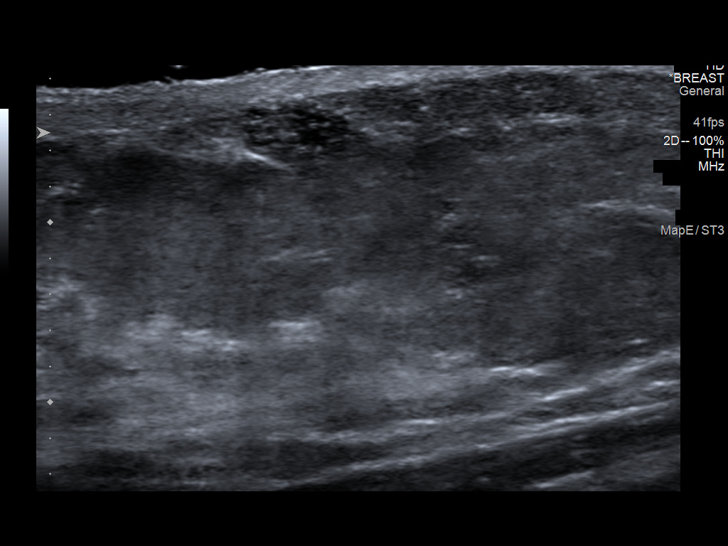
[im 10/12]
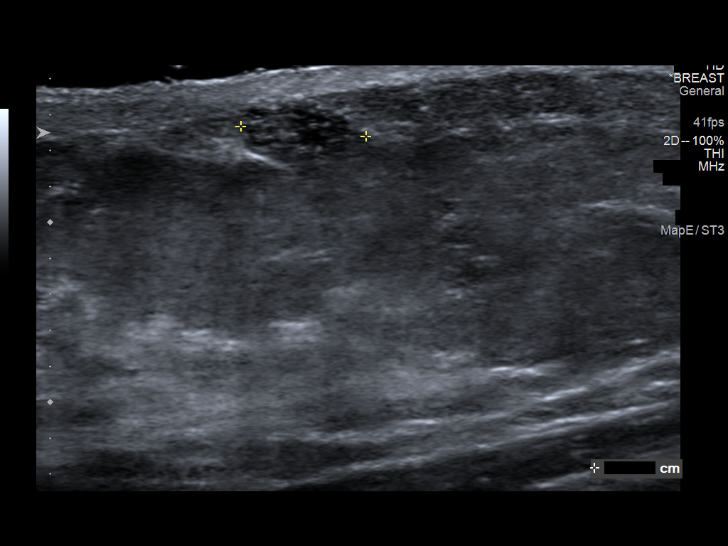
[im 11/12]
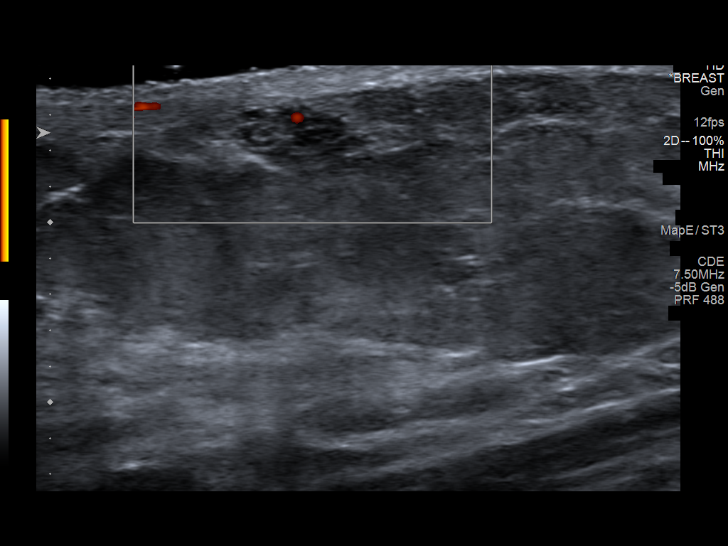
[im 12/12]
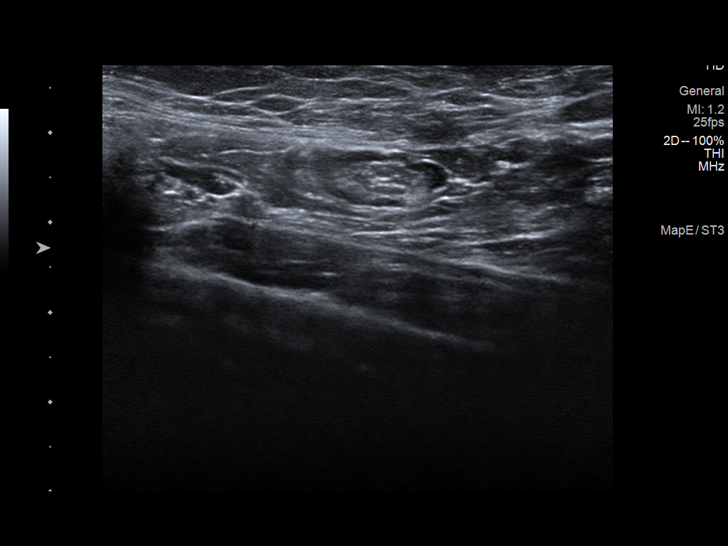

[12 of 12 positions shown; findings below may reference images not displayed]

ACR Breast Density Category c: The breast tissue is heterogeneously
dense, which may obscure small masses.
FINDINGS: 2D/3D spot compression views of the RIGHT breast demonstrate a
persistent circumscribed oval mass within the OUTER RIGHT breast and
persistent lobulated mass within the superficial LOWER OUTER RIGHT
breast.

Targeted ultrasound is performed, showing a 0.5 x 0.4 x 0.7 cm
benign simple cyst at the 9 o'clock position of the RIGHT breast 3
cm from the nipple.

A 0.7 x 0.3 x 0.7 cm slightly ill-defined hypoechoic mass with tiny
cystic spaces noted at the 4 o'clock position of the RIGHT breast 1
cm from the nipple.

No abnormal RIGHT axillary lymph nodes are identified.
IMPRESSION: 1. Indeterminate but low suspicion superficial 0.7 cm mass within
the LOWER INNER RIGHT breast corresponding to 1 of the screening
study findings. Options of short-term follow-up and 6 month
follow-up were discussed with the patient. She strongly desires
tissue sampling given family history.
2. Benign simple cyst in the OUTER RIGHT breast corresponding to the
other screening study finding.

RECOMMENDATION:
Ultrasound-guided biopsy of RIGHT breast mass at the 4 o'clock
position, which will be scheduled.

I have discussed the findings and recommendations with the patient.
If applicable, a reminder letter will be sent to the patient
regarding the next appointment.

BI-RADS CATEGORY  4: Suspicious.

## 2019-04-29 ENCOUNTER — Other Ambulatory Visit: Payer: Self-pay

## 2019-04-29 ENCOUNTER — Ambulatory Visit
Admission: RE | Admit: 2019-04-29 | Discharge: 2019-04-29 | Disposition: A | Discharge status: Home / Self Care | Payer: BC Managed Care – PPO | Source: Ambulatory Visit | Attending: Internal Medicine | Admitting: Internal Medicine

## 2019-04-29 DIAGNOSIS — N6310 Unspecified lump in the right breast, unspecified quadrant: Secondary | ICD-10-CM

## 2019-04-29 HISTORY — PX: BREAST BIOPSY: SHX20

## 2019-04-29 IMAGING — MG MM BREAST LOCALIZATION CLIP
4 series · 4 of 12 positions shown · non-contrast
Comparison: Previous exam(s).

CLINICAL DATA: Status post ultrasound-guided core biopsy of a right
breast mass.

EXAM:
3D DIAGNOSTIC RIGHT MAMMOGRAM POST ULTRASOUND BIOPSY

[R ML synth-2D]
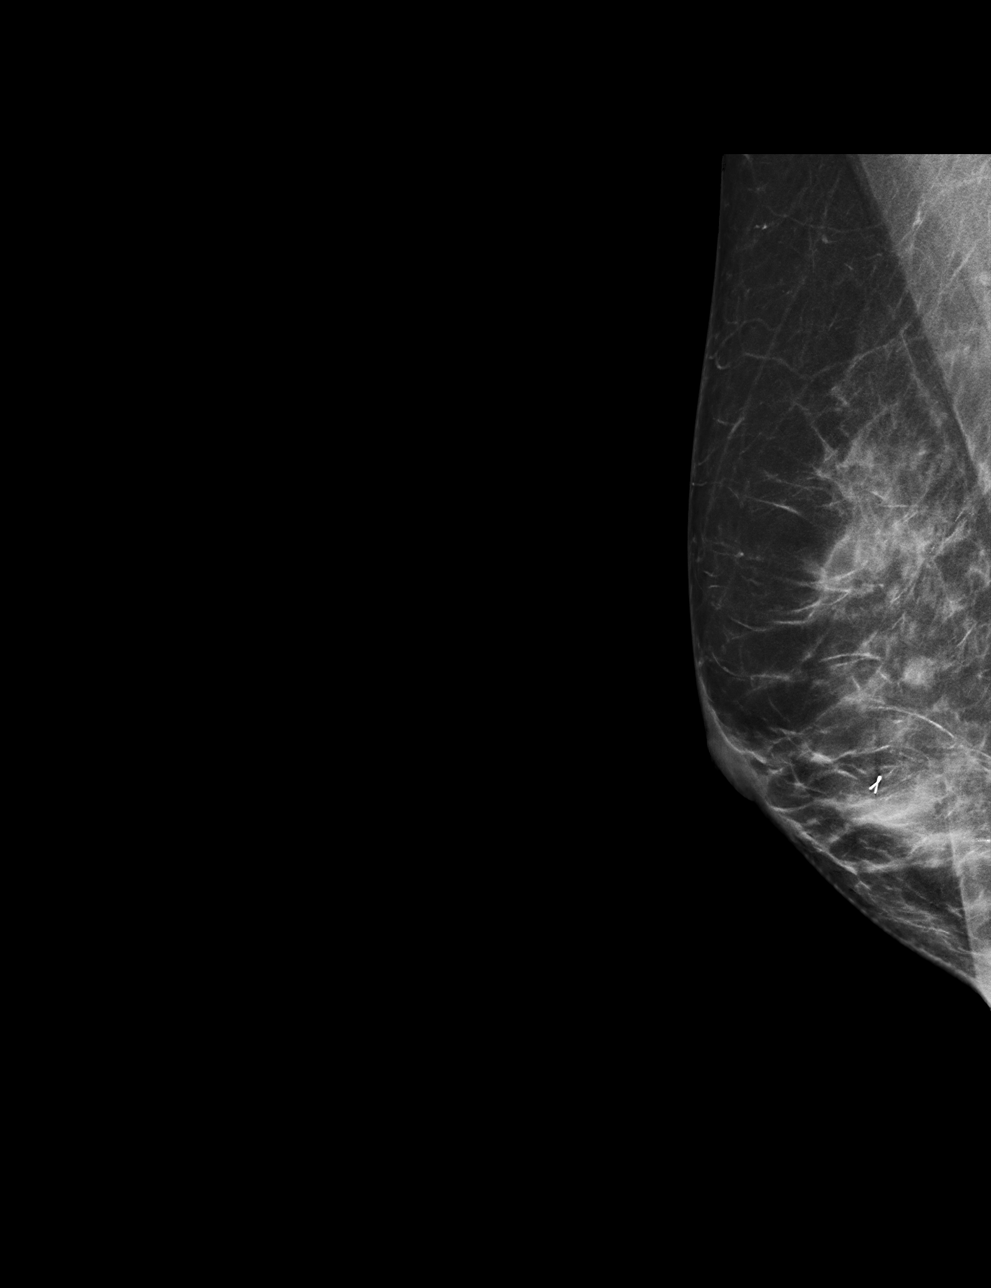

[R CC synth-2D]
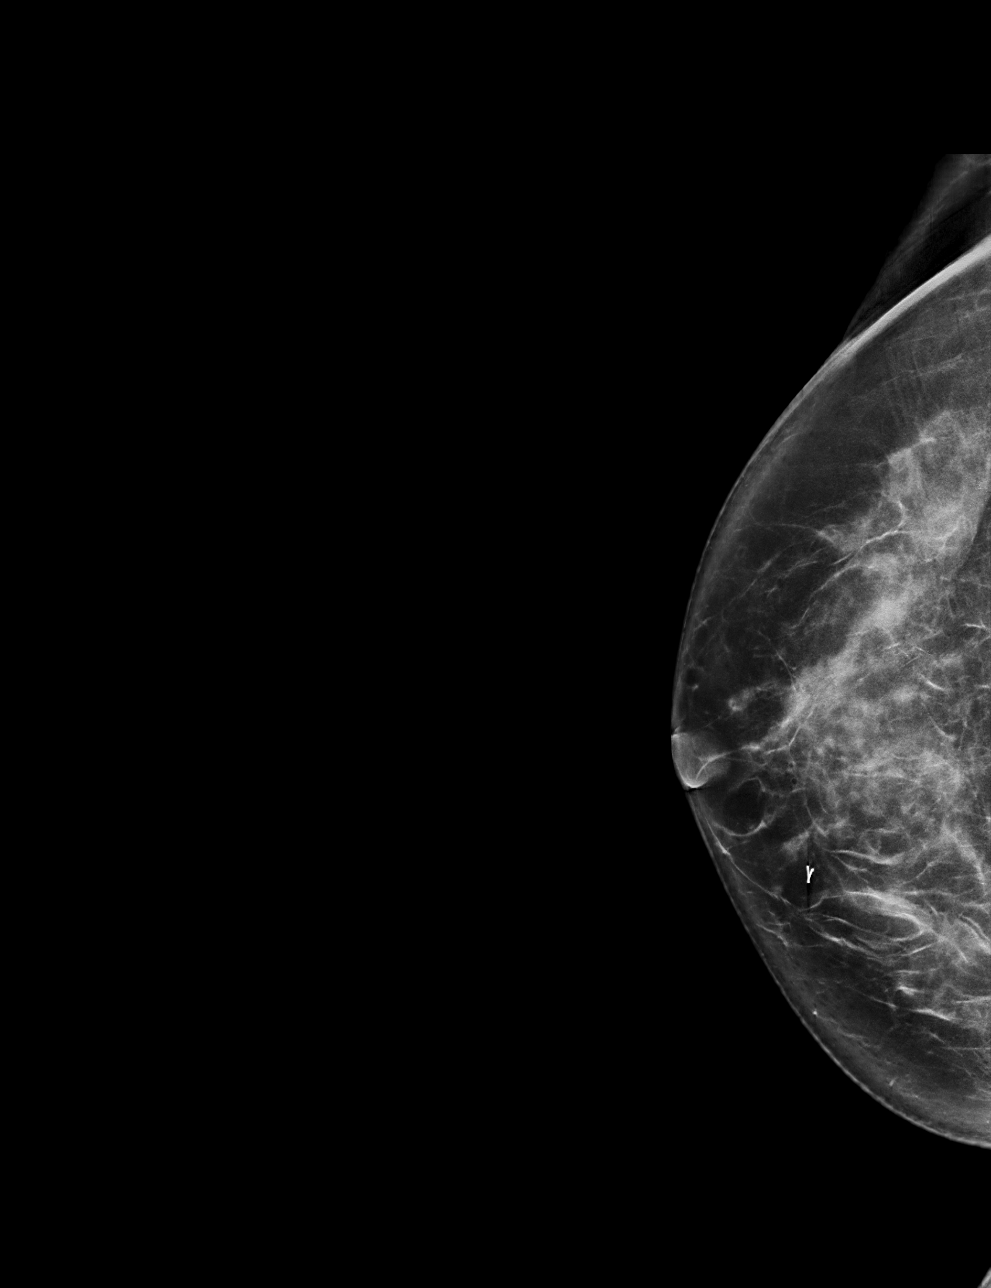

[R CC tomo · tomo slice 38/75.0]
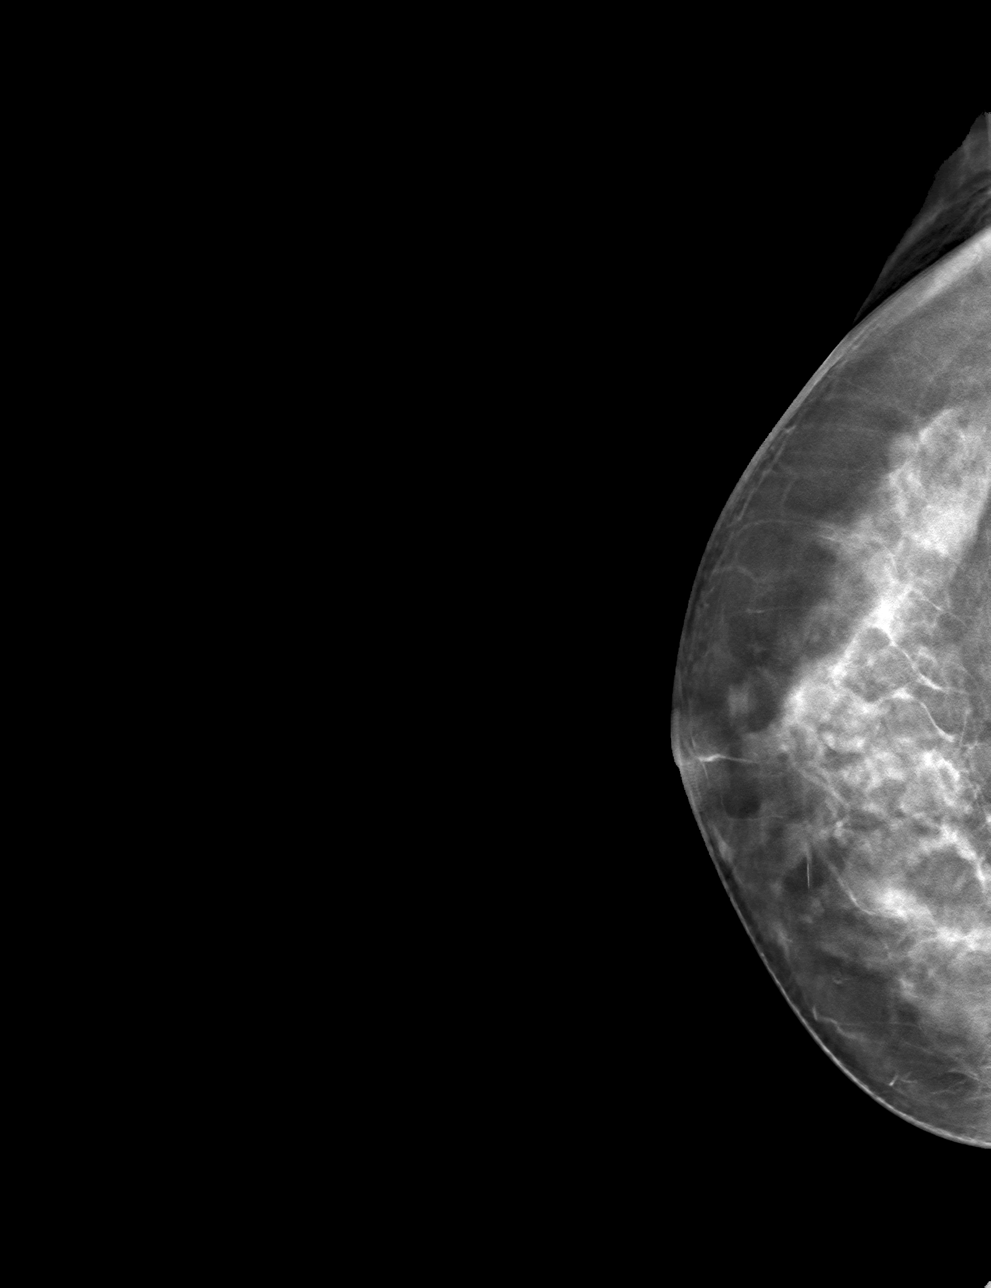

[R ML tomo · tomo slice 37/73.0]
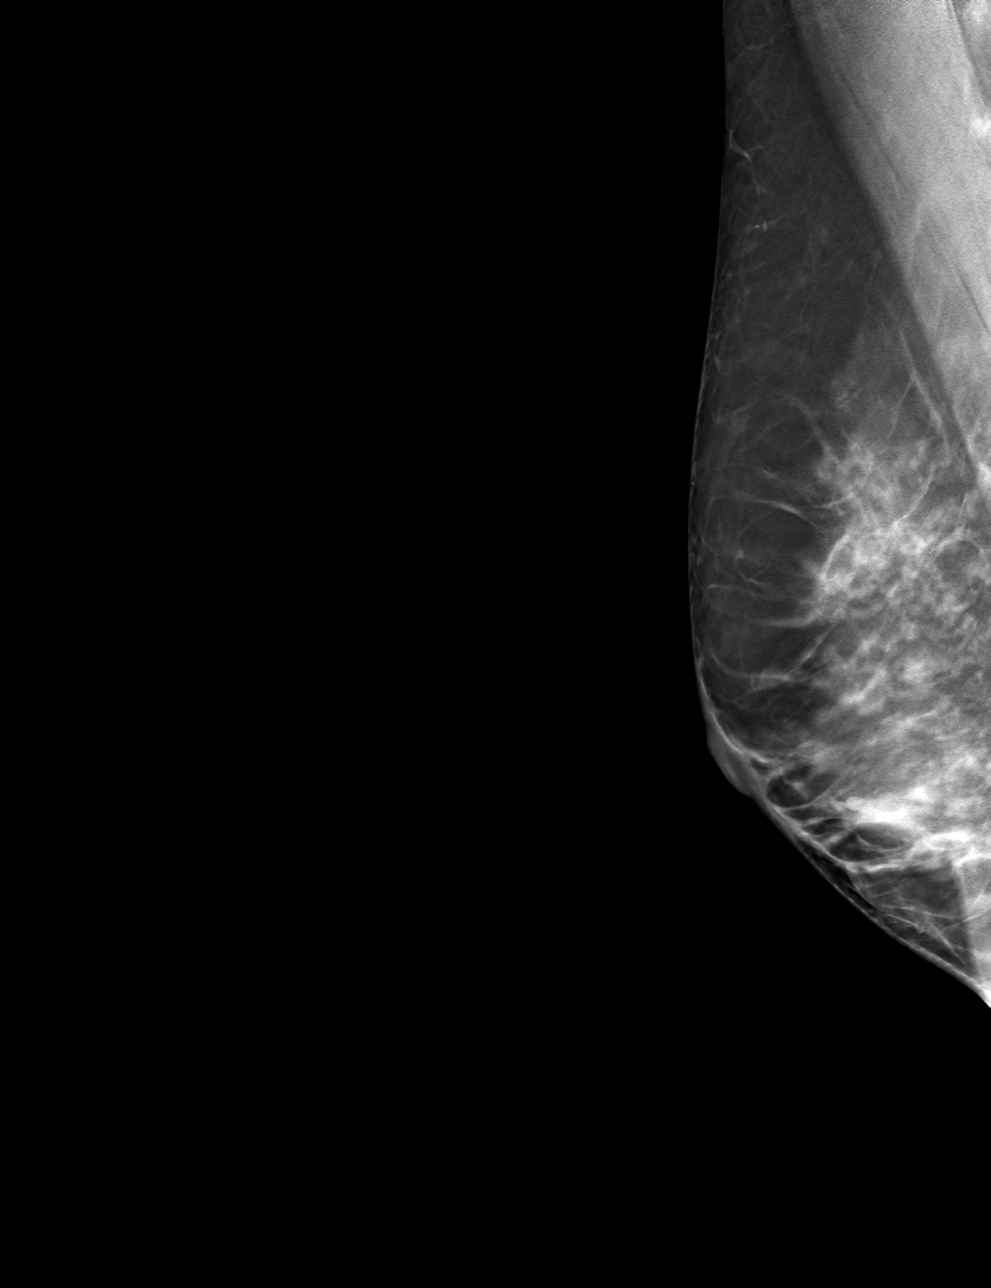

[4 of 12 positions shown; findings below may reference images not displayed]

FINDINGS: 3D Mammographic images were obtained following ultrasound guided
biopsy of a mass in the 4 o'clock region of the right breast. The
biopsy ribbon shaped marking clip is in appropriate position in the
lower-inner quadrant of the right breast.
IMPRESSION: Appropriate positioning of the ribbon shaped biopsy marking clip at
the site of biopsy in the lower-inner quadrant of the right breast.

Final Assessment: Post Procedure Mammograms for Marker Placement

## 2019-04-29 IMAGING — US US  BREAST BX W/ LOC DEV 1ST LESION IMG BX SPEC US GUIDE*R*
1 series · 10 of 10 positions shown · non-contrast
Comparison: Previous exam(s).
COMPARISON: Previous exam(s).

Addendum:
CLINICAL DATA: Right breast mass.

EXAM:
ULTRASOUND GUIDED RIGHT BREAST CORE NEEDLE BIOPSY

[Series 1: us breast bx w/ loc dev 1st lesion img bx spec us  · 0.03mm/px · 10 of 10 slices shown]
[im 1/10]
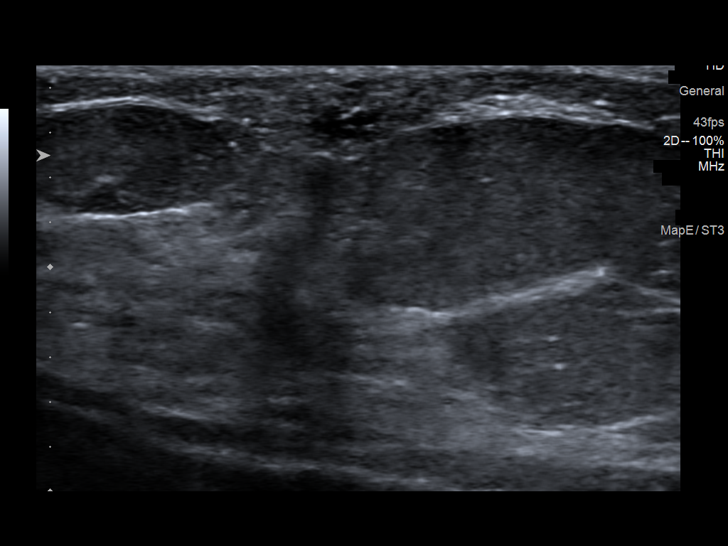
[im 2/10]
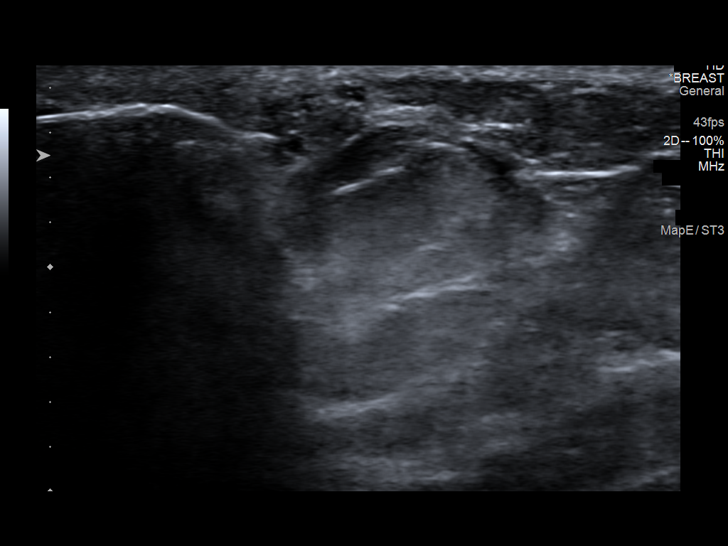
[im 3/10]
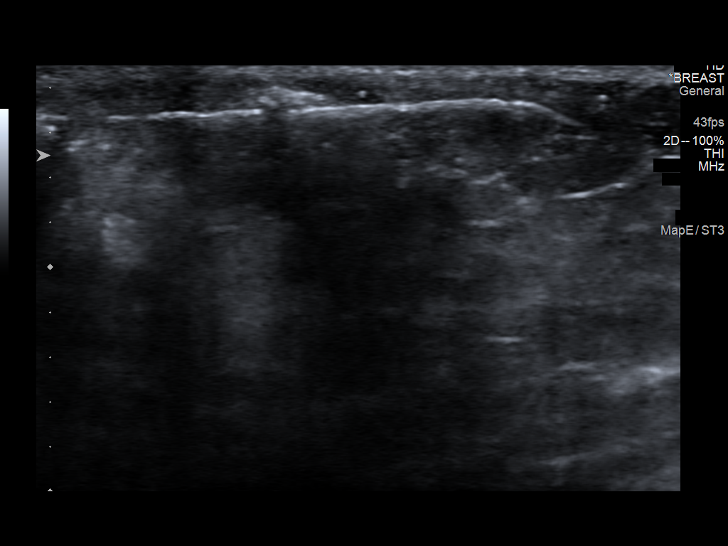
[im 4/10]
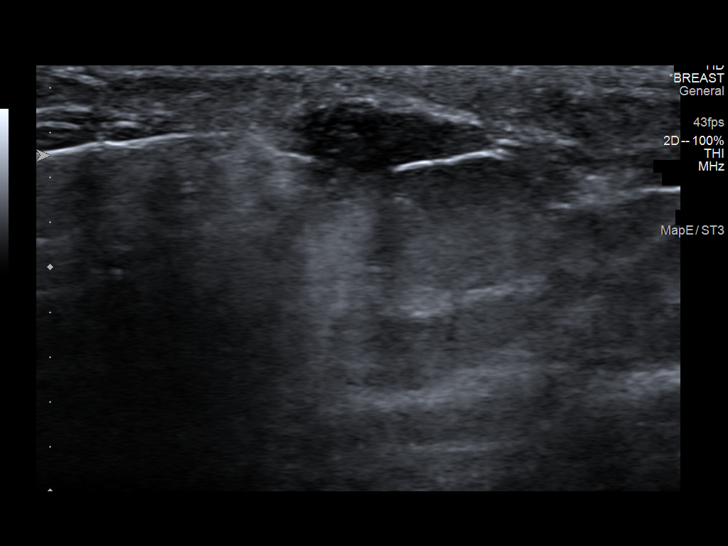
[im 5/10]
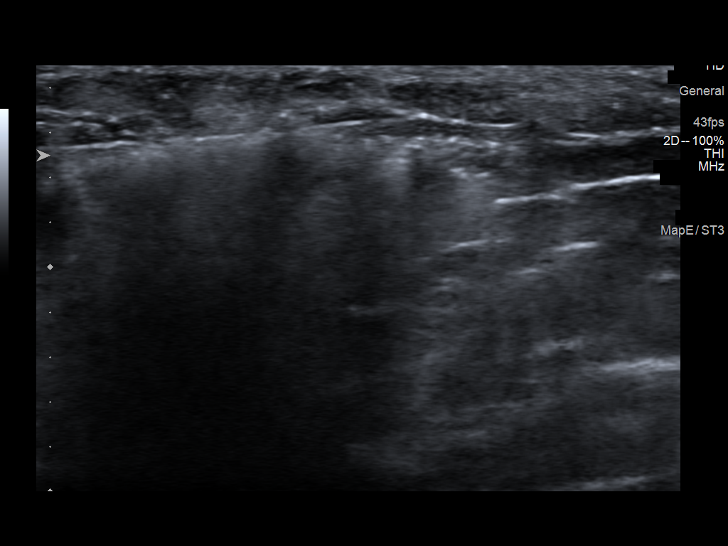
[im 6/10]
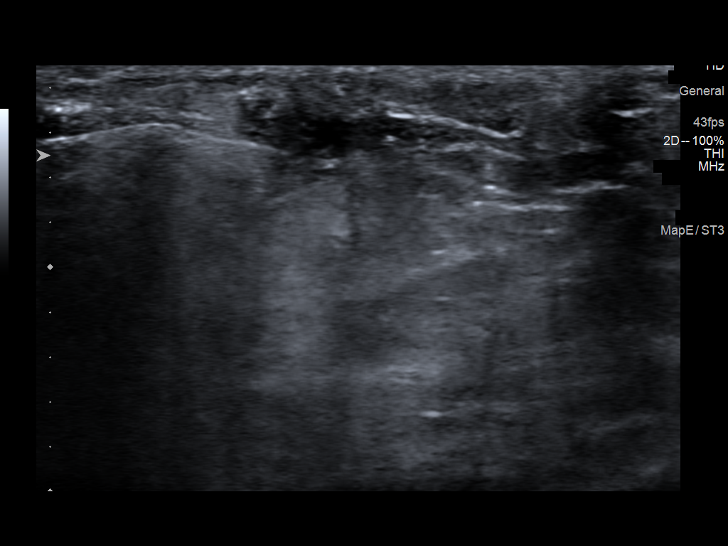
[im 7/10]
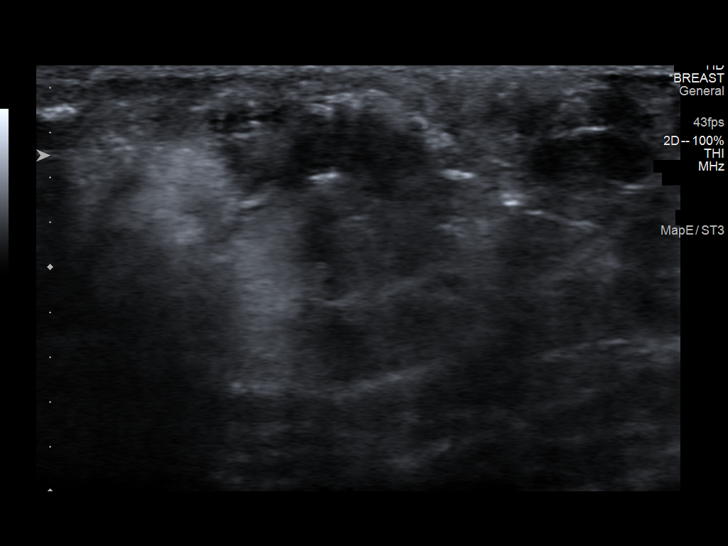
[im 8/10]
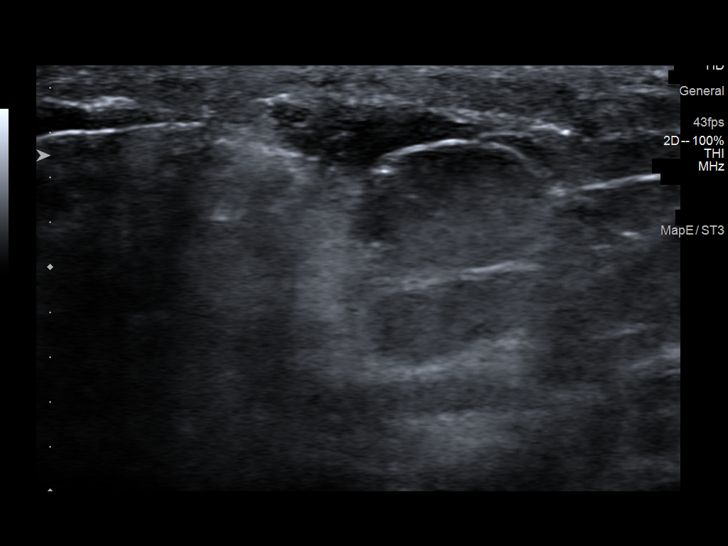
[im 9/10]
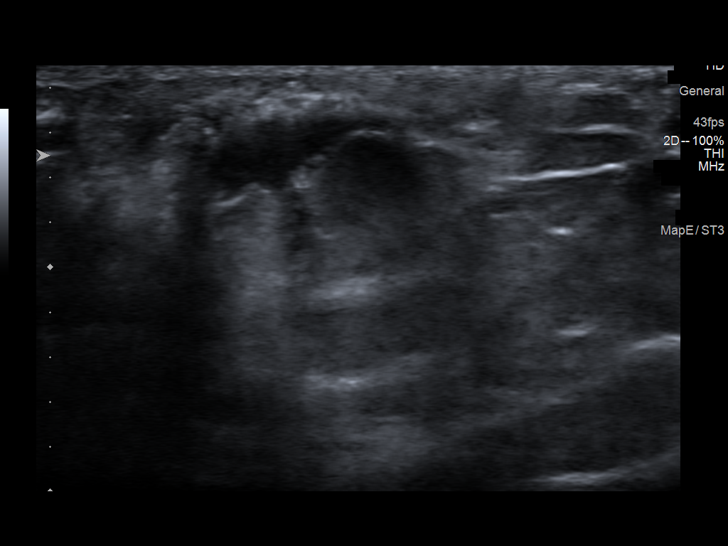
[im 10/10]
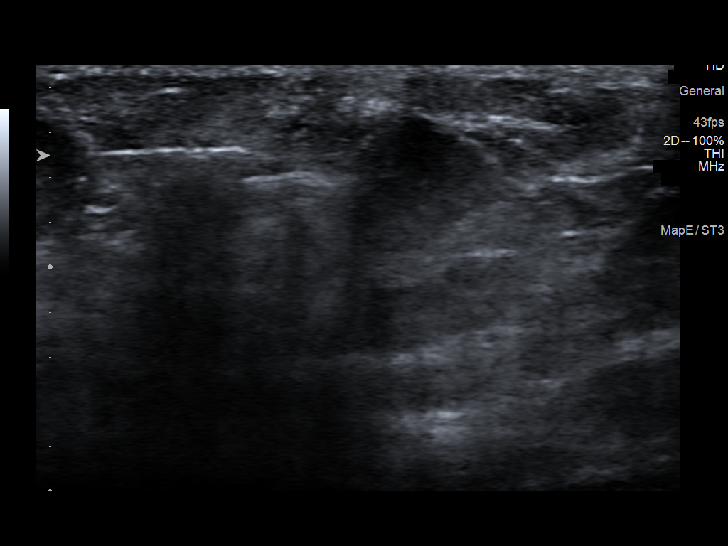

[10 of 10 positions shown; findings below may reference images not displayed]



Lesion quadrant: Lower inner quadrant.

Using sterile technique and 1% lidocaine and 1% lidocaine with
epinephrine as local anesthetic, under direct ultrasound
visualization, a 12 gauge CALLARI device was used to perform
biopsy of a mass in the 4 o'clock region of the right breast using a
lateral to medial approach. At the conclusion of the procedure
ribbon shaped tissue marker clip was deployed into the biopsy
cavity. Follow up 2 view mammogram was performed and dictated
separately.
IMPRESSION: Ultrasound guided biopsy of the right breast. No apparent
complications.

ADDENDUM:
Pathology revealed APOCRINE METAPLASIA of the Right breast, 4
o'clock. This was found to be concordant by Dr. CALLARI.

Pathology results were discussed with the patient by telephone. The
patient reported doing well after the biopsy with tenderness at the
site. Post biopsy instructions and care were reviewed and questions
were answered. The patient was encouraged to call The [REDACTED]

The patient was instructed to return for annual screening
mammography and informed a reminder notice would be sent regarding
this appointment.

Pathology results reported by CALLARI, RN on [DATE].



Lesion quadrant: Lower inner quadrant.

Using sterile technique and 1% lidocaine and 1% lidocaine with
epinephrine as local anesthetic, under direct ultrasound
visualization, a 12 gauge CALLARI device was used to perform
biopsy of a mass in the 4 o'clock region of the right breast using a
lateral to medial approach. At the conclusion of the procedure
ribbon shaped tissue marker clip was deployed into the biopsy
cavity. Follow up 2 view mammogram was performed and dictated
separately.
IMPRESSION: Ultrasound guided biopsy of the right breast. No apparent
complications.

## 2019-10-21 ENCOUNTER — Emergency Department (HOSPITAL_COMMUNITY): Payer: BC Managed Care – PPO

## 2019-10-21 ENCOUNTER — Other Ambulatory Visit: Payer: Self-pay

## 2019-10-21 ENCOUNTER — Emergency Department (HOSPITAL_COMMUNITY)
Admission: EM | Admit: 2019-10-21 | Discharge: 2019-10-21 | Disposition: A | Discharge status: Home / Self Care | Payer: BC Managed Care – PPO | Attending: Emergency Medicine | Admitting: Emergency Medicine

## 2019-10-21 DIAGNOSIS — M545 Low back pain, unspecified: Secondary | ICD-10-CM

## 2019-10-21 DIAGNOSIS — R1032 Left lower quadrant pain: Secondary | ICD-10-CM | POA: Diagnosis not present

## 2019-10-21 DIAGNOSIS — M549 Dorsalgia, unspecified: Secondary | ICD-10-CM | POA: Diagnosis present

## 2019-10-21 LAB — CBC WITH DIFFERENTIAL/PLATELET
Abs Immature Granulocytes: 0.03 10*3/uL (ref 0.00–0.07)
Basophils Absolute: 0.1 10*3/uL (ref 0.0–0.1)
Basophils Relative: 1 %
Eosinophils Absolute: 0.2 10*3/uL (ref 0.0–0.5)
Eosinophils Relative: 3 %
HCT: 40.8 % (ref 36.0–46.0)
Hemoglobin: 13.8 g/dL (ref 12.0–15.0)
Immature Granulocytes: 0 %
Lymphocytes Relative: 30 %
Lymphs Abs: 2.3 10*3/uL (ref 0.7–4.0)
MCH: 32.4 pg (ref 26.0–34.0)
MCHC: 33.8 g/dL (ref 30.0–36.0)
MCV: 95.8 fL (ref 80.0–100.0)
Monocytes Absolute: 0.5 10*3/uL (ref 0.1–1.0)
Monocytes Relative: 7 %
Neutro Abs: 4.5 10*3/uL (ref 1.7–7.7)
Neutrophils Relative %: 59 %
Platelets: 268 10*3/uL (ref 150–400)
RBC: 4.26 MIL/uL (ref 3.87–5.11)
RDW: 11.8 % (ref 11.5–15.5)
WBC: 7.7 10*3/uL (ref 4.0–10.5)
nRBC: 0 % (ref 0.0–0.2)

## 2019-10-21 LAB — COMPREHENSIVE METABOLIC PANEL
ALT: 25 U/L (ref 0–44)
AST: 24 U/L (ref 15–41)
Albumin: 4 g/dL (ref 3.5–5.0)
Alkaline Phosphatase: 136 U/L — ABNORMAL HIGH (ref 38–126)
Anion gap: 9 (ref 5–15)
BUN: 18 mg/dL (ref 6–20)
CO2: 25 mmol/L (ref 22–32)
Calcium: 8.8 mg/dL — ABNORMAL LOW (ref 8.9–10.3)
Chloride: 105 mmol/L (ref 98–111)
Creatinine, Ser: 0.88 mg/dL (ref 0.44–1.00)
GFR calc Af Amer: >60 mL/min (ref >60)
GFR calc non Af Amer: >60 mL/min (ref >60)
Glucose, Bld: 99 mg/dL (ref 70–99)
Potassium: 3.8 mmol/L (ref 3.5–5.1)
Sodium: 139 mmol/L (ref 135–145)
Total Bilirubin: 0.4 mg/dL (ref 0.3–1.2)
Total Protein: 6.4 g/dL — ABNORMAL LOW (ref 6.5–8.1)

## 2019-10-21 LAB — URINALYSIS, ROUTINE W REFLEX MICROSCOPIC
Bilirubin Urine: NEGATIVE
Glucose, UA: NEGATIVE mg/dL
Hgb urine dipstick: NEGATIVE
Ketones, ur: NEGATIVE mg/dL
Leukocytes,Ua: NEGATIVE
Nitrite: NEGATIVE
Protein, ur: NEGATIVE mg/dL
Specific Gravity, Urine: 1.004 — ABNORMAL LOW (ref 1.005–1.030)
pH: 5 (ref 5.0–8.0)

## 2019-10-21 LAB — LIPASE, BLOOD: Lipase: 34 U/L (ref 11–51)

## 2019-10-21 IMAGING — CT CT RENAL STONE PROTOCOL
2 of 3 series · 17 of 46 positions shown, 19 images · non-contrast
Comparison: [DATE]

CLINICAL DATA: Left-sided flank pain radiating to the right side.

EXAM:
CT ABDOMEN AND PELVIS WITHOUT CONTRAST
TECHNIQUE: Multidetector CT imaging of the abdomen and pelvis was performed
following the standard protocol without IV contrast.

[Series 3: lung bases · axial · 0.73mm/px · z∈[-461,-353]mm · 14 of 63 slices shown, 16 images]
[im 5/63  soft-tissue]
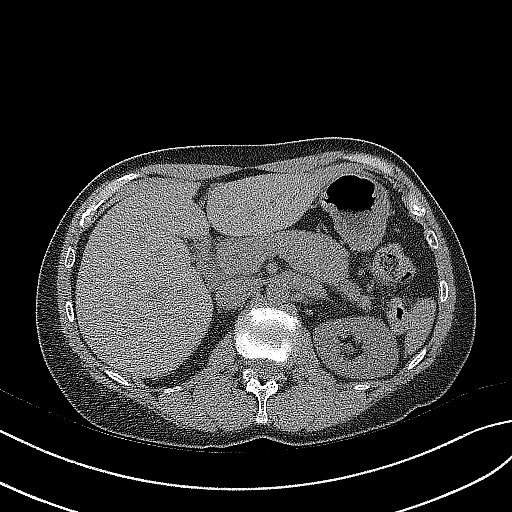
[im 5/63  bone]
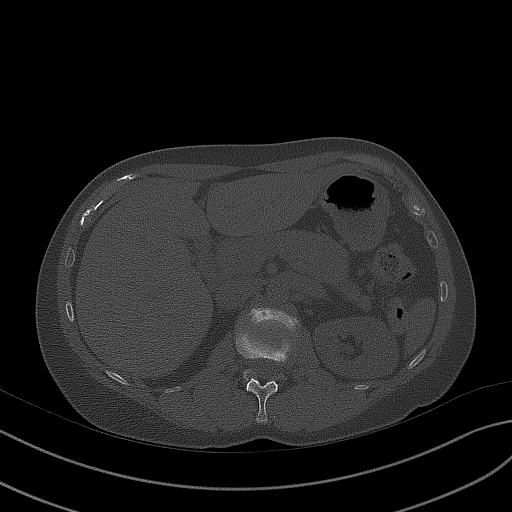
[im 9/63  soft-tissue]
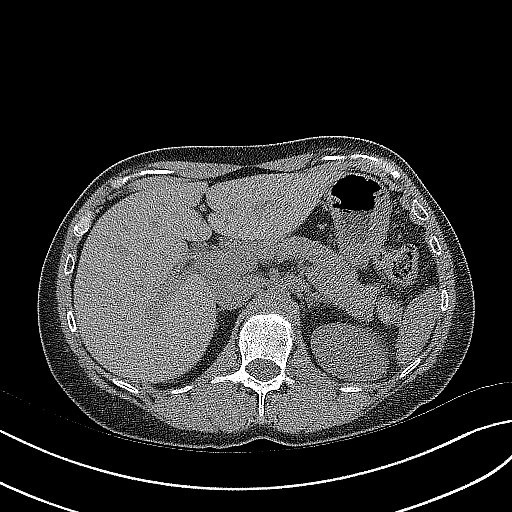
[im 13/63  soft-tissue]
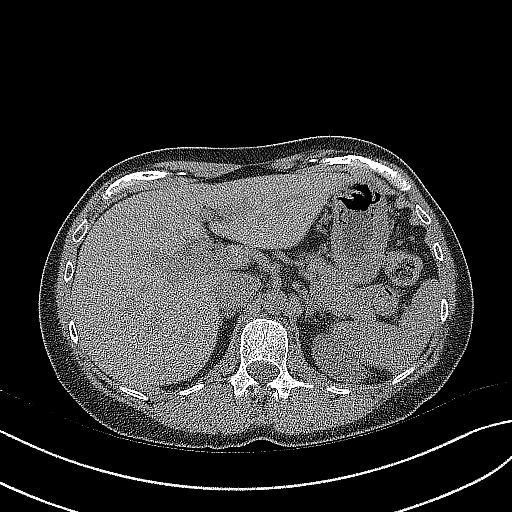
[im 17/63  soft-tissue]
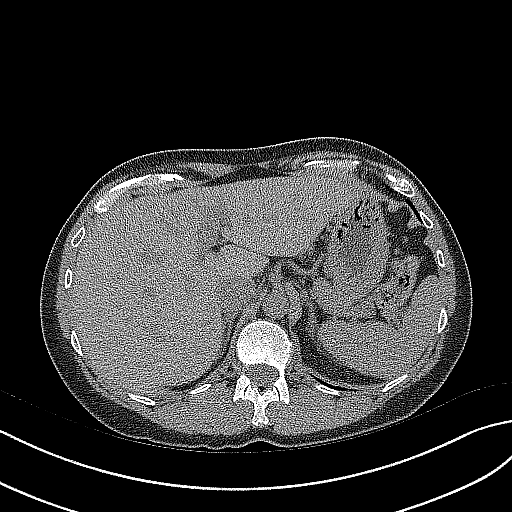
[im 21/63  soft-tissue]
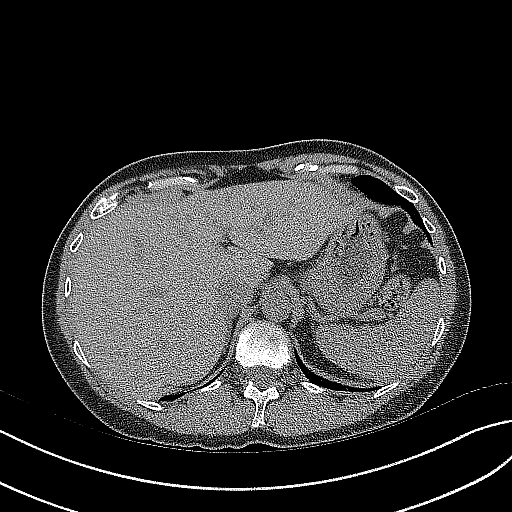
[im 25/63  soft-tissue]
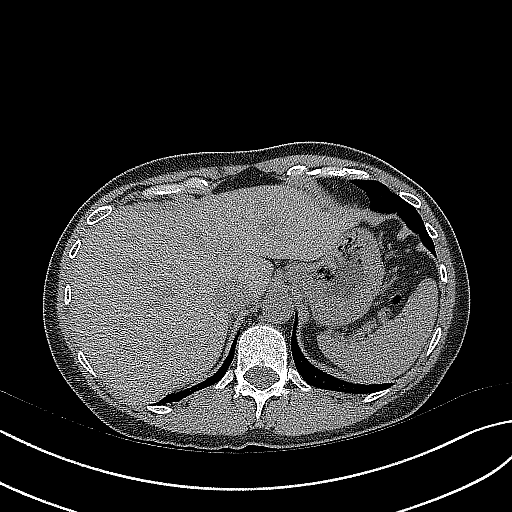
[im 29/63  soft-tissue]
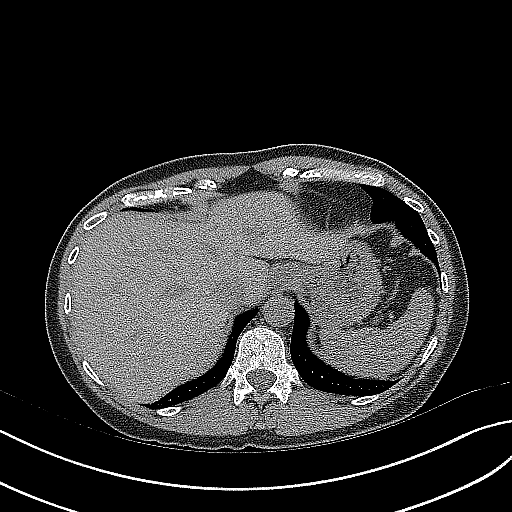
[im 35/63  soft-tissue]
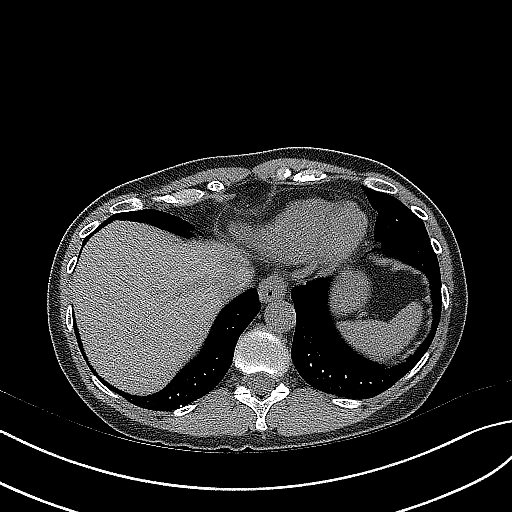
[im 39/63  soft-tissue]
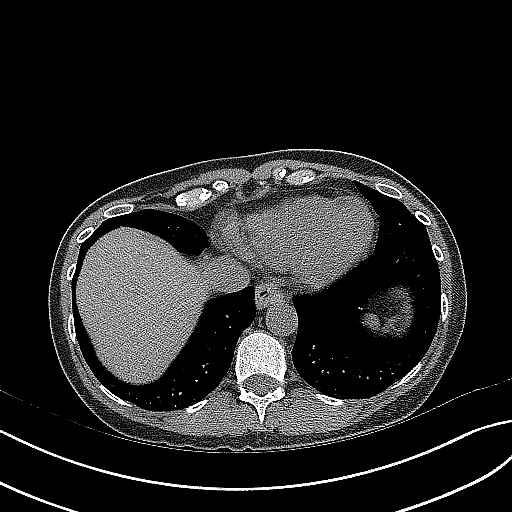
[im 39/63  bone]
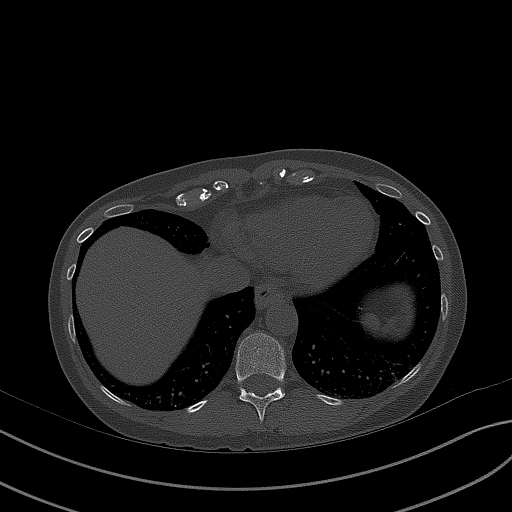
[im 43/63  soft-tissue]
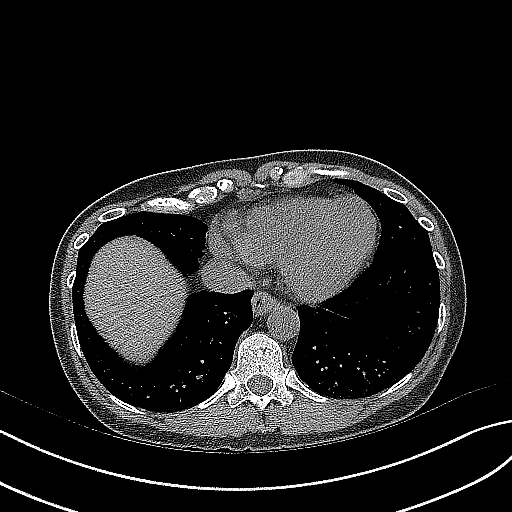
[im 47/63  soft-tissue]
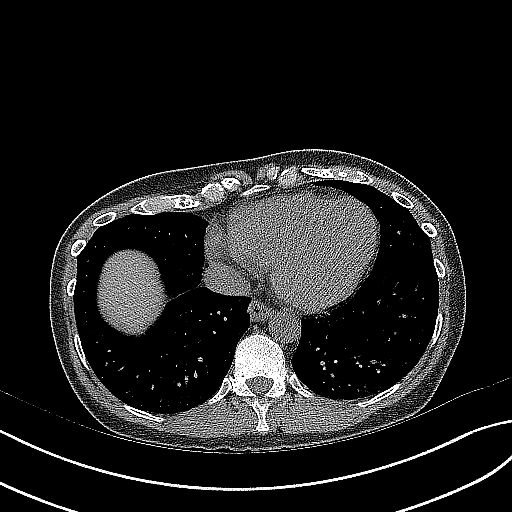
[im 51/63  soft-tissue]
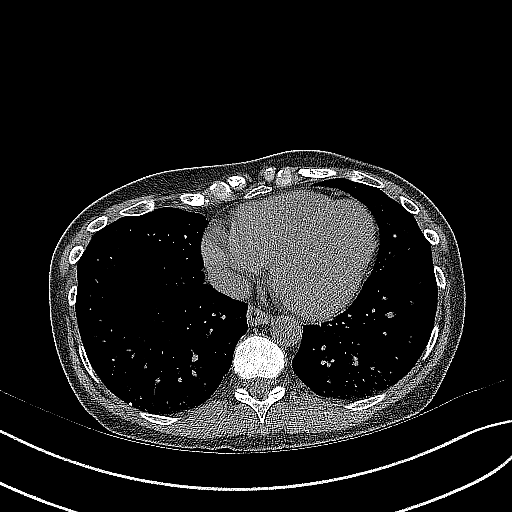
[im 55/63  soft-tissue]
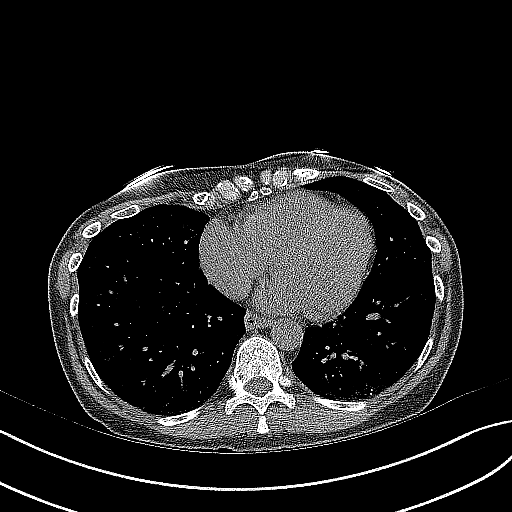
[im 59/63  soft-tissue]
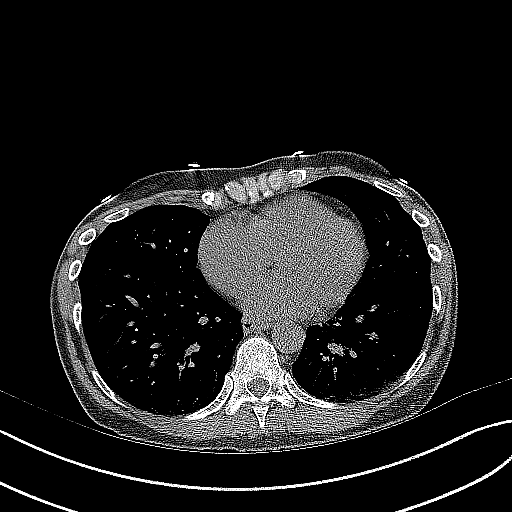

[Series 5: coronal · coronal · 0.68mm/px · 3 of 124 slices shown]
[im 42/124  soft-tissue]
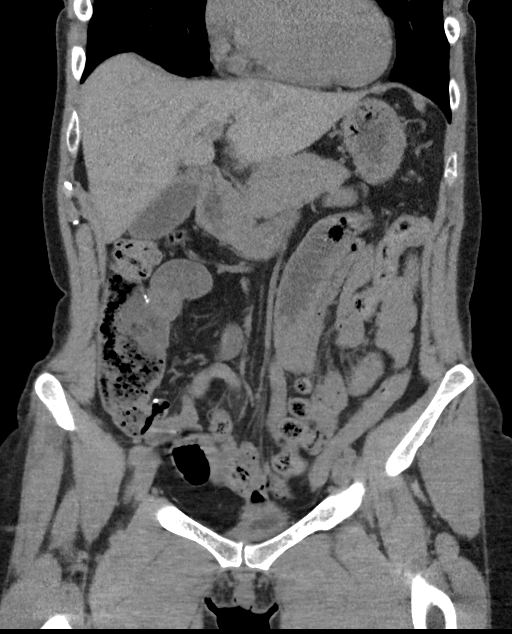
[im 55/124  soft-tissue]
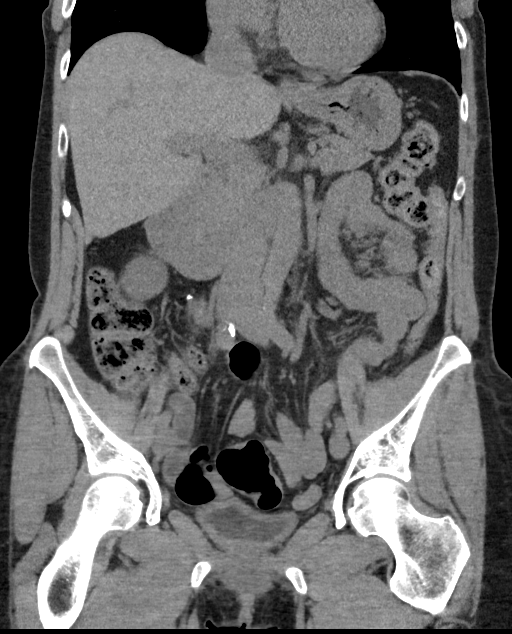
[im 69/124  soft-tissue]
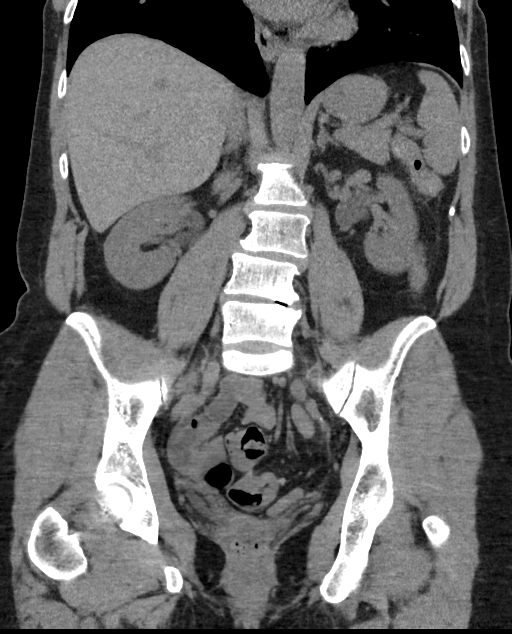

[17 of 46 positions shown; findings below may reference images not displayed]

FINDINGS: Lower chest:  Calcified nodules in the right middle and lower lobes.

Hepatobiliary: No focal liver abnormality.No evidence of biliary
obstruction or stone.

Pancreas: Unremarkable.

Spleen: Unremarkable.

Adrenals/Urinary Tract: Negative adrenals. No hydronephrosis or
stone. Unremarkable bladder.

Stomach/Bowel: Ileocolic anastomosis in the right lower quadrant. No
obstruction or inflammatory changes.

Vascular/Lymphatic: No acute vascular abnormality. No mass or
adenopathy.

Reproductive:Hysterectomy.

Other: No ascites or pneumoperitoneum.

Musculoskeletal: No acute abnormalities. Lumbar spine degeneration
with mild scoliosis.
IMPRESSION: 1. No acute finding.
2. Lumbar spine degeneration with mild scoliosis.

## 2019-10-21 MED ORDER — METAXALONE 400 MG PO TABS: 400.0000 mg | ORAL_TABLET | Freq: Three times a day (TID) | ORAL | 0 refills | Status: DC | PRN

## 2019-10-21 MED ORDER — SODIUM CHLORIDE 0.9 % IV BOLUS
1000.0000 mL | Freq: Once | INTRAVENOUS | Status: AC
  Administered 2019-10-21: 1000 mL via INTRAVENOUS

## 2019-10-21 NOTE — Discharge Instructions (Signed)
If you develop worsening, recurrent, or continued back pain, numbness or weakness in the legs, incontinence of your bowels or bladders, numbness of your buttocks, fever, abdominal pain, or any other new/concerning symptoms then return to the ER for evaluation.  

## 2019-10-21 NOTE — ED Triage Notes (Addendum)
54 yo female woke up at 430am with left sided back pain radiating to right lateral side. Denies injury. Denies fevers, dysuria, hematuria. Denies loss of bowel or bladder control. Pt had a sigmoidoscopy last week.

## 2019-10-21 NOTE — ED Provider Notes (Signed)
[facility] DEPT Provider Note   CSN: [id number] Arrival date & time: 10/21/19  0915     History Chief Complaint  Patient presents with  . Back Pain    [name] is a 54 y.o. female.  HPI 54 year old female presents with left-sided back pain.  Started this morning all of a sudden at 430.  Took some ibuprofen and Tylenol which has dulled the pain but it is still present.  She has had pain in his area every few weeks or so for a while but never this intense.  No fevers, vomiting, or change in bowel movements.  Feels like the pain wraps a little bit around towards her lower abdomen.  No radiation down the legs or weakness/numbness in the legs.  No incontinence.  Currently the pain is about a 2 out of 10.  She has noted some darker urine recently but no dysuria or hematuria. Movement did not seem to alleviate or worsen the pain. No injuries.   No past medical history on file.  There are no problems to display for this patient.   Past Surgical History:  Procedure Laterality Date  . ABDOMINAL HYSTERECTOMY    . APPENDECTOMY    . bowel removal     5 inches  . BOWEL RESECTION     2 feet     OB History   No obstetric history on file.     Family History  Problem Relation Age of Onset  . Breast cancer Neg Hx     Social History   Tobacco Use  . Smoking status: Never Smoker  . Smokeless tobacco: Never Used  Substance Use Topics  . Alcohol use: No  . Drug use: No    Home Medications Prior to Admission medications   Medication Sig Start Date End Date Taking? Authorizing Provider  ALPRAZolam Duanne Moron) 0.25 MG tablet Take 0.25 mg by mouth at bedtime as needed for anxiety.   Yes [provider]  Calcium Carbonate-Vitamin D (CALCIUM + D PO) Take 1 tablet by mouth daily.   Yes [provider]  cholestyramine light (PREVALITE) 4 G packet Take 4 g by mouth daily.   Yes [provider]  clonazePAM (KLONOPIN) 0.5 MG  tablet Take 0.5 mg by mouth 2 (two) times daily as needed for anxiety.   Yes [provider]  diclofenac Sodium (VOLTAREN) 1 % GEL Apply 2 g topically daily as needed (pain).   Yes [provider]  escitalopram (LEXAPRO) 20 MG tablet Take 30 mg by mouth daily. 10/18/19  Yes [provider]  ibuprofen (ADVIL) 200 MG tablet Take 200-800 mg by mouth every 6 (six) hours as needed for moderate pain.   Yes [provider]  ipratropium (ATROVENT) 0.06 % nasal spray Place 2 sprays into both nostrils daily as needed for rhinitis.   Yes [provider]  melatonin 3 MG TABS tablet Take 3 mg by mouth at bedtime as needed (sleep).   Yes [provider]  meloxicam (MOBIC) 15 MG tablet Take 15 mg by mouth daily as needed for pain.   Yes [provider]  omeprazole (PRILOSEC) 20 MG capsule Take 20 mg by mouth daily as needed (heart burn).   Yes [provider]  promethazine (PHENERGAN) 12.5 MG tablet Take 12.5 mg by mouth every 6 (six) hours as needed for nausea or vomiting.   Yes [provider]  metaxalone (SKELAXIN) 400 MG tablet Take 1 tablet (400 mg total) by  mouth 3 (three) times daily as needed for muscle spasms. 10/21/19   Sherwood Gambler, MD    Allergies    Sulfa antibiotics, Wellbutrin [bupropion], and Zosyn [piperacillin sod-tazobactam so]  Review of Systems   Review of Systems  Constitutional: Negative for fever.  Gastrointestinal: Negative for abdominal pain, constipation, diarrhea and vomiting.  Genitourinary: Negative for dysuria and hematuria.  Musculoskeletal: Positive for back pain.  Neurological: Negative for weakness and numbness.  All other systems reviewed and are negative.   Physical Exam Updated Vital Signs BP 109/72   Pulse (!) 53   Temp 98.6 F (37 C) (Oral)   Resp 16   Ht 5\' 4"  (1.626 m)   Wt 62.6 kg   LMP 11/18/2012   SpO2 100%   BMI 23.69 kg/m   Physical Exam Vitals and nursing note  reviewed.  Constitutional:      Appearance: She is well-developed.  HENT:     Head: Normocephalic and atraumatic.     Right Ear: External ear normal.     Left Ear: External ear normal.     Nose: Nose normal.  Eyes:     General:        Right eye: No discharge.        Left eye: No discharge.  Cardiovascular:     Rate and Rhythm: Normal rate and regular rhythm.     Heart sounds: Normal heart sounds.  Pulmonary:     Effort: Pulmonary effort is normal.     Breath sounds: Normal breath sounds.  Abdominal:     Palpations: Abdomen is soft.     Tenderness: There is abdominal tenderness in the left lower quadrant. There is left CVA tenderness (transiently had tenderness. when checking again there is no tenderness). There is no right CVA tenderness.  Musculoskeletal:     Thoracic back: No bony tenderness.     Lumbar back: No bony tenderness.  Skin:    General: Skin is warm and dry.  Neurological:     Mental Status: She is alert.     Comments: 5/5 strength in BLE. Normal gross sensation  Psychiatric:        Mood and Affect: Mood is not anxious.     ED Results / Procedures / Treatments   Labs (all labs ordered are listed, but only abnormal results are displayed) Labs Reviewed  COMPREHENSIVE METABOLIC PANEL - Abnormal; Notable for the following components:      Result Value   Calcium 8.8 (*)    Total Protein 6.4 (*)    Alkaline Phosphatase 136 (*)    All other components within normal limits  URINALYSIS, ROUTINE W REFLEX MICROSCOPIC - Abnormal; Notable for the following components:   Color, Urine STRAW (*)    Specific Gravity, Urine 1.004 (*)    All other components within normal limits  LIPASE, BLOOD  CBC WITH DIFFERENTIAL/PLATELET    EKG None  Radiology CT Renal Stone Study  Result Date: 10/21/2019 CLINICAL DATA:  Left-sided flank pain radiating to the right side. EXAM: CT ABDOMEN AND PELVIS WITHOUT CONTRAST TECHNIQUE: Multidetector CT imaging of the abdomen and pelvis  was performed following the standard protocol without IV contrast. COMPARISON:  09/15/2006 FINDINGS: Lower chest:  Calcified nodules in the right middle and lower lobes. Hepatobiliary: No focal liver abnormality.No evidence of biliary obstruction or stone. Pancreas: Unremarkable. Spleen: Unremarkable. Adrenals/Urinary Tract: Negative adrenals. No hydronephrosis or stone. Unremarkable bladder. Stomach/Bowel: Ileocolic anastomosis in the right lower quadrant. No obstruction or inflammatory changes. Vascular/Lymphatic:  No acute vascular abnormality. No mass or adenopathy. Reproductive:Hysterectomy. Other: No ascites or pneumoperitoneum. Musculoskeletal: No acute abnormalities. Lumbar spine degeneration with mild scoliosis. IMPRESSION: 1. No acute finding. 2. Lumbar spine degeneration with mild scoliosis. Electronically Signed   By: Monte Fantasia M.D.   On: 10/21/2019 10:25    Procedures Procedures (including critical care time)  Medications Ordered in ED Medications  sodium chloride 0.9 % bolus 1,000 mL (1,000 mLs Intravenous New Bag/Given 10/21/19 1016)    ED Course  I have reviewed the triage vital signs and the nursing notes.  Pertinent labs & imaging results that were available during my care of the patient were reviewed by me and considered in my medical decision making (see chart for details).    MDM Rules/Calculators/A&P                      Patient declines pain meds, pain is not that bad right now. Some LLQ tenderness so CT obtained, no acute findings, including no ureteral stone.  Neuro exam is unremarkable.  Given she has had this many times before, I doubt acute emergent cause.  Highly doubt spinal cord emergency.  She has taken Skelaxin before and is asking for a prescription for this which I think is reasonable.  Continue NSAIDs and Tylenol.  We discussed return precautions and need for follow-up with PCP. Final Clinical Impression(s) / ED Diagnoses Final diagnoses:  Acute  left-sided low back pain without sciatica    Rx / DC Orders ED Discharge Orders         Ordered    metaxalone (SKELAXIN) 400 MG tablet  3 times daily PRN     10/21/19 1120           Sherwood Gambler, MD 10/21/19 1125

## 2019-10-27 ENCOUNTER — Emergency Department (HOSPITAL_COMMUNITY): Payer: BC Managed Care – PPO

## 2019-10-27 ENCOUNTER — Emergency Department (HOSPITAL_COMMUNITY)
Admission: EM | Admit: 2019-10-27 | Discharge: 2019-10-27 | Disposition: A | Discharge status: Home / Self Care | Payer: BC Managed Care – PPO | Attending: Emergency Medicine | Admitting: Emergency Medicine

## 2019-10-27 DIAGNOSIS — Z79899 Other long term (current) drug therapy: Secondary | ICD-10-CM | POA: Diagnosis not present

## 2019-10-27 DIAGNOSIS — Y999 Unspecified external cause status: Secondary | ICD-10-CM | POA: Diagnosis not present

## 2019-10-27 DIAGNOSIS — Y929 Unspecified place or not applicable: Secondary | ICD-10-CM | POA: Diagnosis not present

## 2019-10-27 DIAGNOSIS — Y9352 Activity, horseback riding: Secondary | ICD-10-CM | POA: Diagnosis not present

## 2019-10-27 DIAGNOSIS — M25512 Pain in left shoulder: Secondary | ICD-10-CM | POA: Diagnosis present

## 2019-10-27 DIAGNOSIS — W5512XA Struck by horse, initial encounter: Secondary | ICD-10-CM | POA: Insufficient documentation

## 2019-10-27 DIAGNOSIS — S42022A Displaced fracture of shaft of left clavicle, initial encounter for closed fracture: Secondary | ICD-10-CM | POA: Insufficient documentation

## 2019-10-27 IMAGING — CR DG CLAVICLE*L*
2 series · 2 of 2 positions shown · non-contrast
Comparison: None.

CLINICAL DATA: Fall from horse.  Left clavicle and shoulder pain

EXAM:
LEFT CLAVICLE - 2+ VIEWS

[clavicle axial]
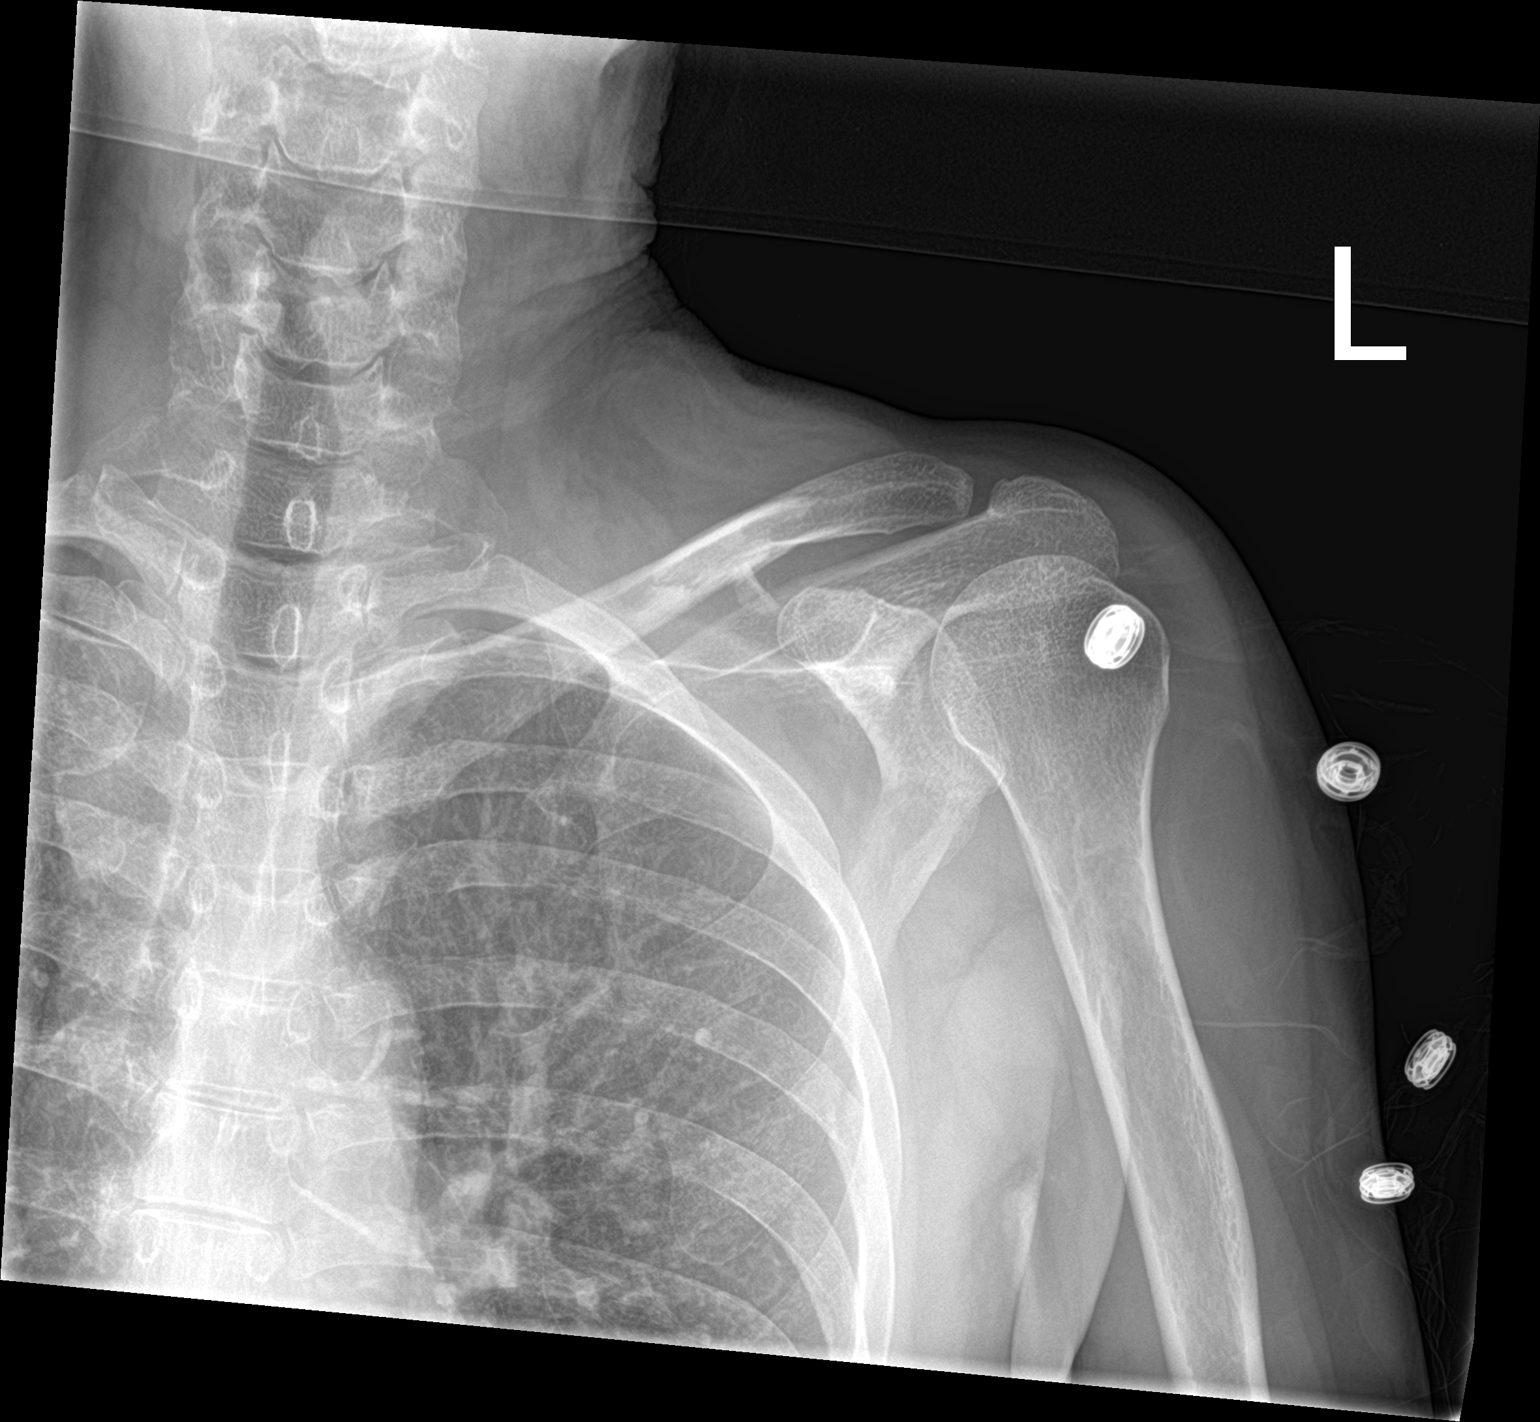

[clavicle ap]
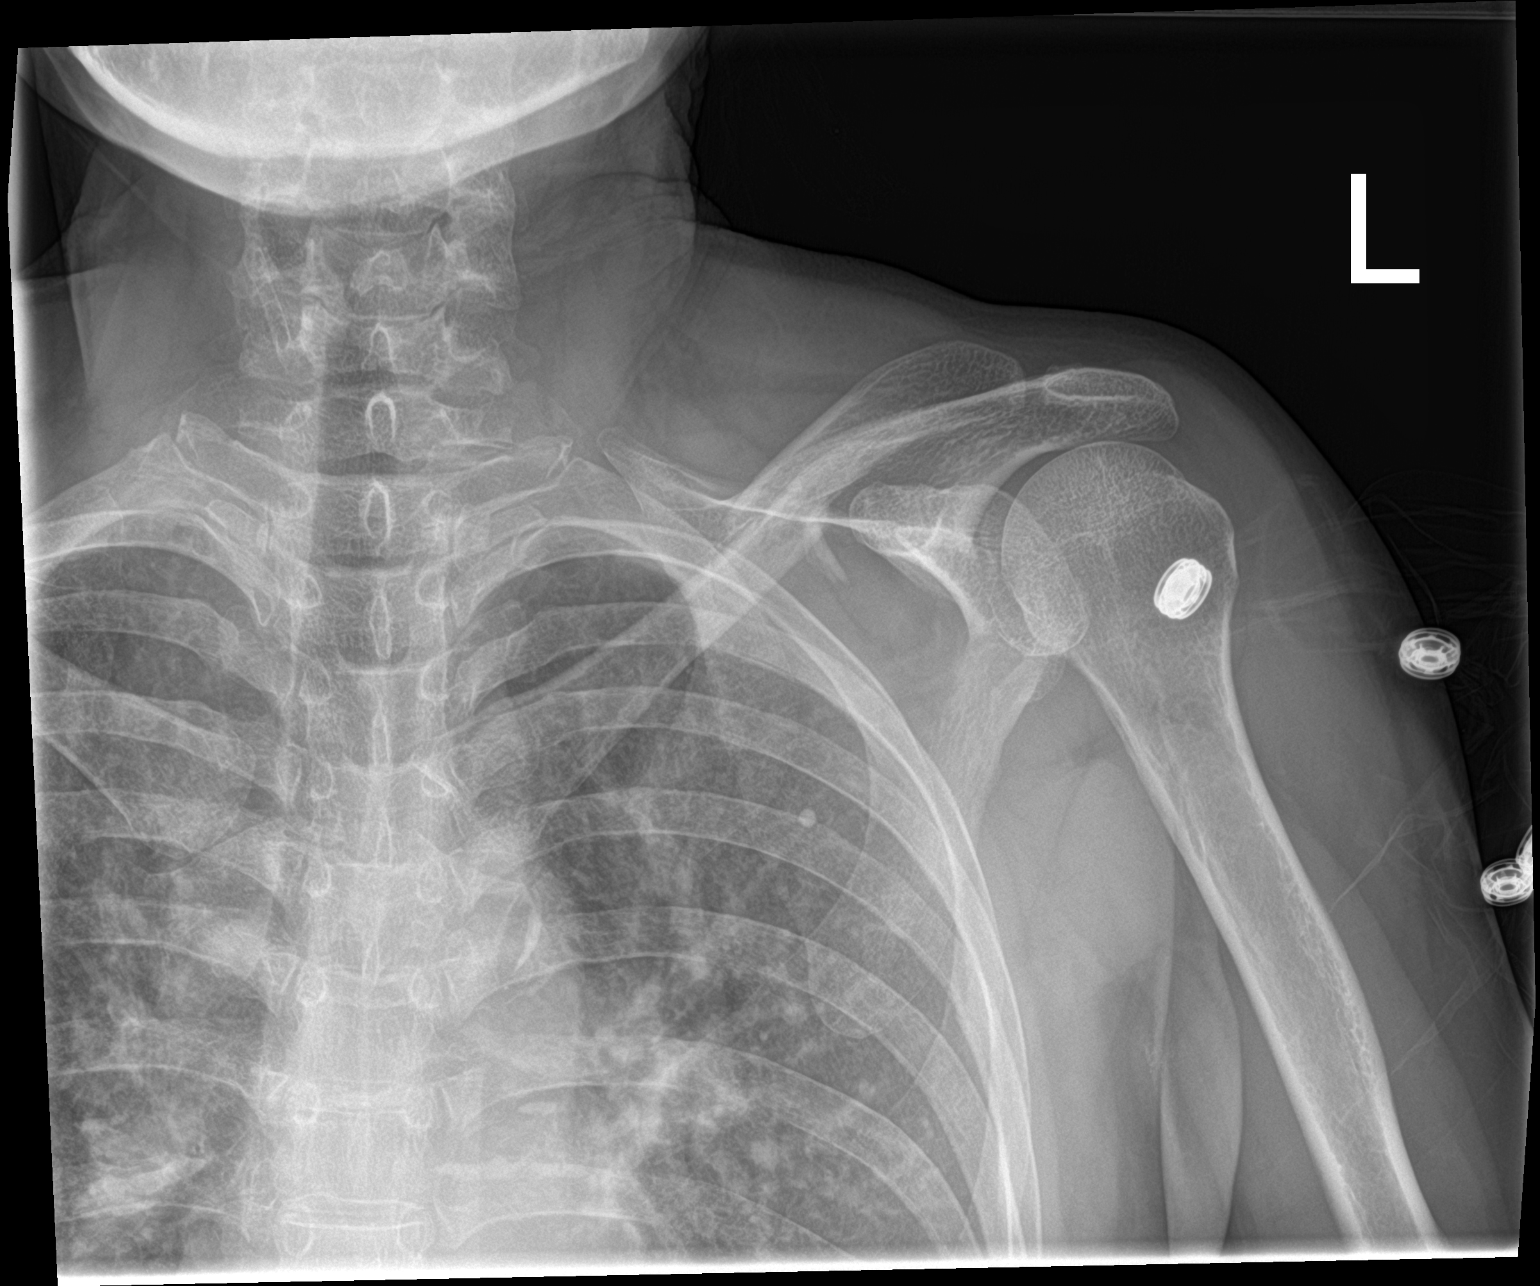

[2 of 2 positions shown; findings below may reference images not displayed]

FINDINGS: There is a mid left clavicle fracture, mildly comminuted and
displaced. AC and glenohumeral joints appear intact.
IMPRESSION: Comminuted, displaced mid left clavicle fracture.

## 2019-10-27 IMAGING — CR DG RIBS W/ CHEST 3+V*L*
5 series · 5 of 5 positions shown · non-contrast
Comparison: None.

CLINICAL DATA: Fall from horse, left shoulder pain

EXAM:
LEFT RIBS AND CHEST - 3+ VIEW

[chest ap]
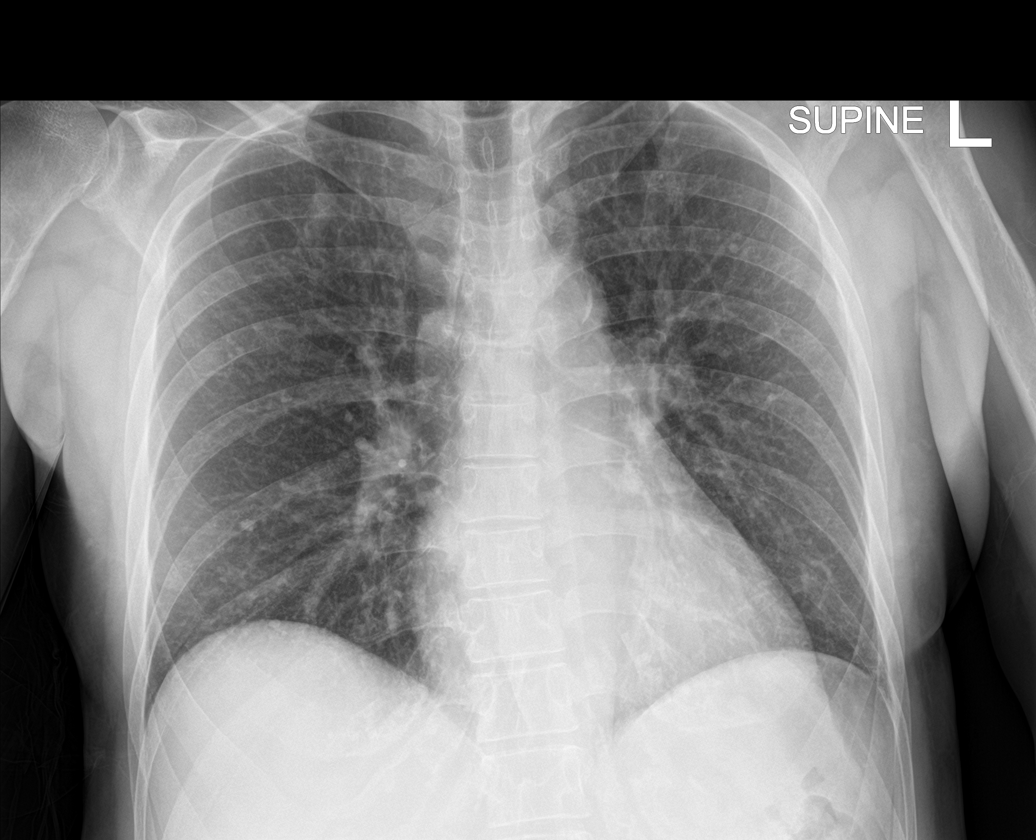

[rib ap (1 of 2)]
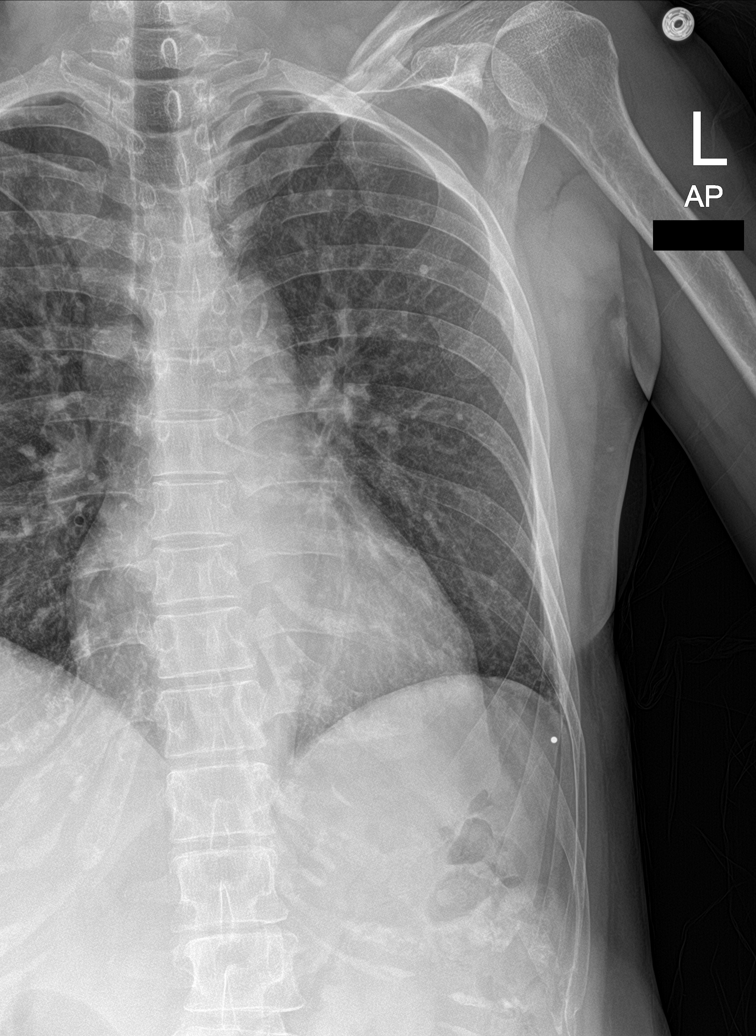

[rib ap obl (1 of 2)]
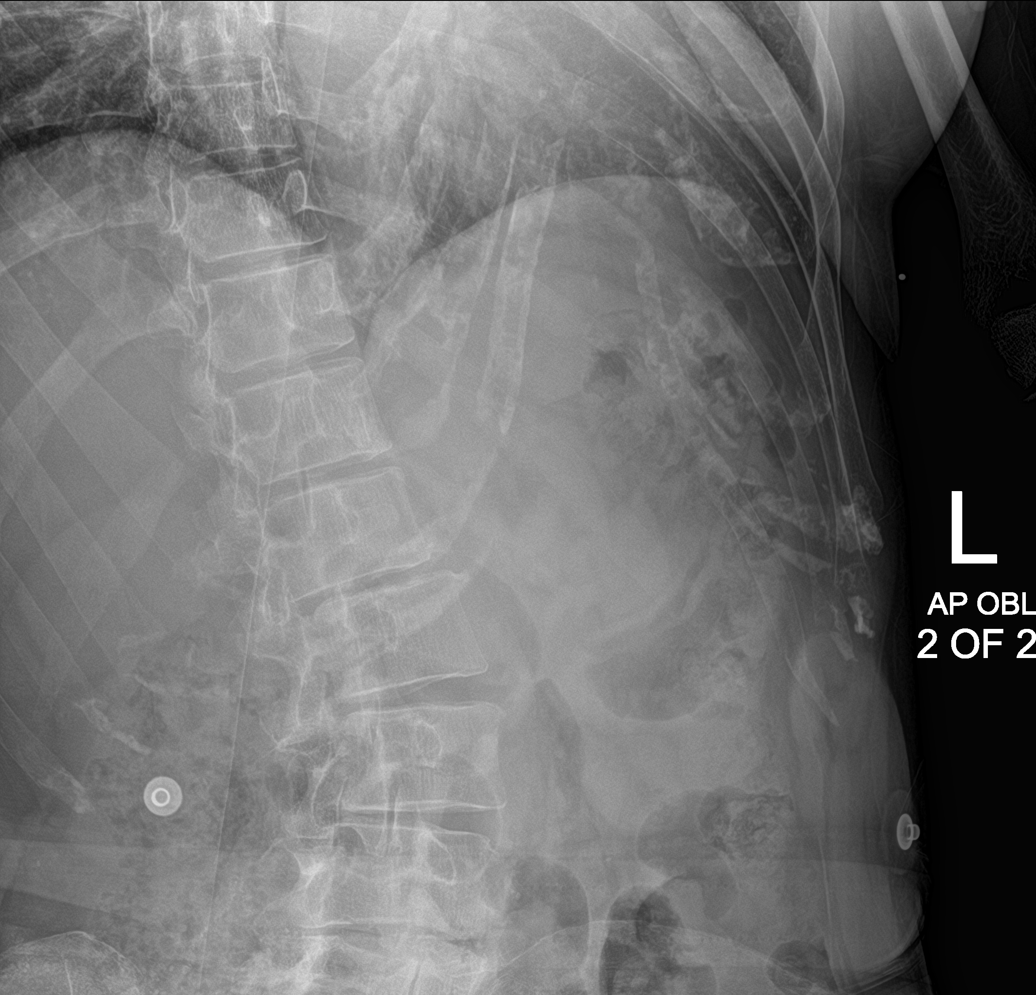

[rib ap (2 of 2)]
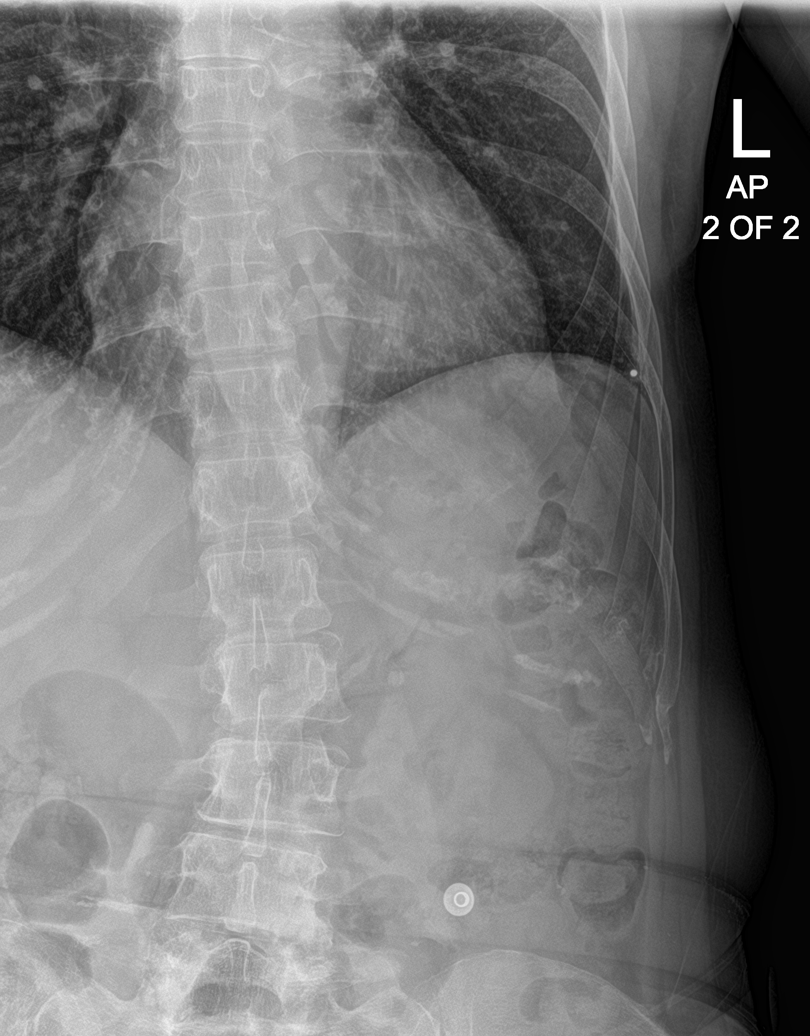

[rib ap obl (2 of 2)]
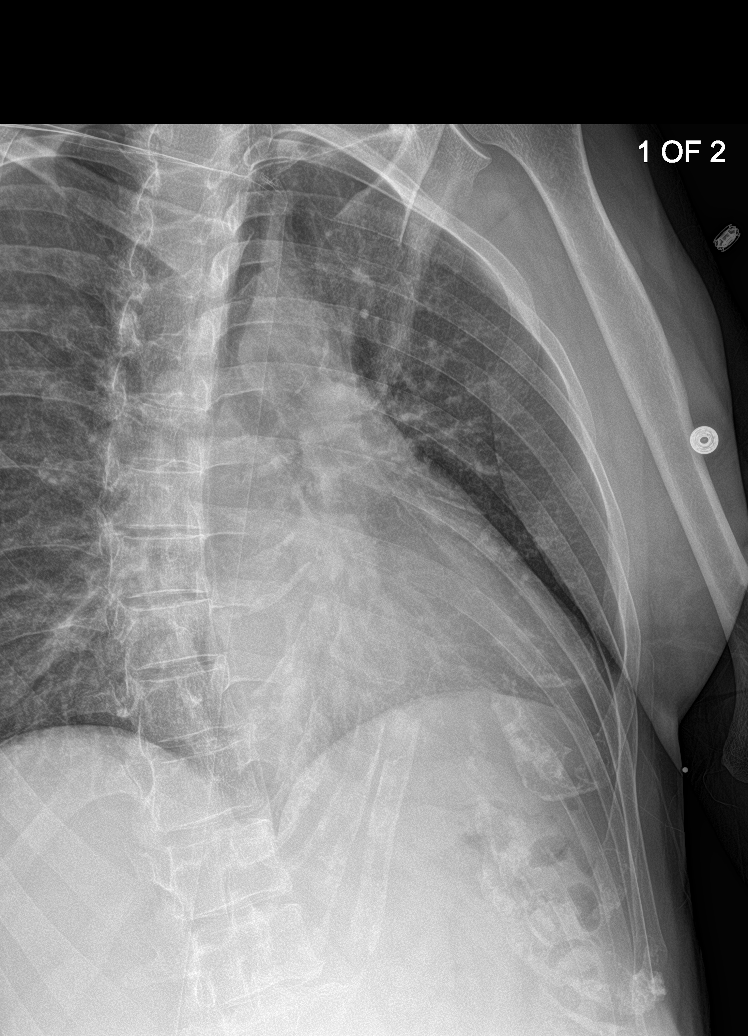

[5 of 5 positions shown; findings below may reference images not displayed]

FINDINGS: Stool heart is normal size. Lungs are clear. No effusions or
pneumothorax. Mid left clavicle fracture is noted. No visible rib
fracture.
IMPRESSION: Left clavicle fracture.  No visible rib fracture.

No acute cardiopulmonary disease.

## 2019-10-27 IMAGING — CR DG SHOULDER 2+V*L*
2 series · 2 of 2 positions shown · non-contrast
Comparison: None.

CLINICAL DATA: Fall from horse

EXAM:
LEFT SHOULDER - 2+ VIEW

[shoulder grashey]
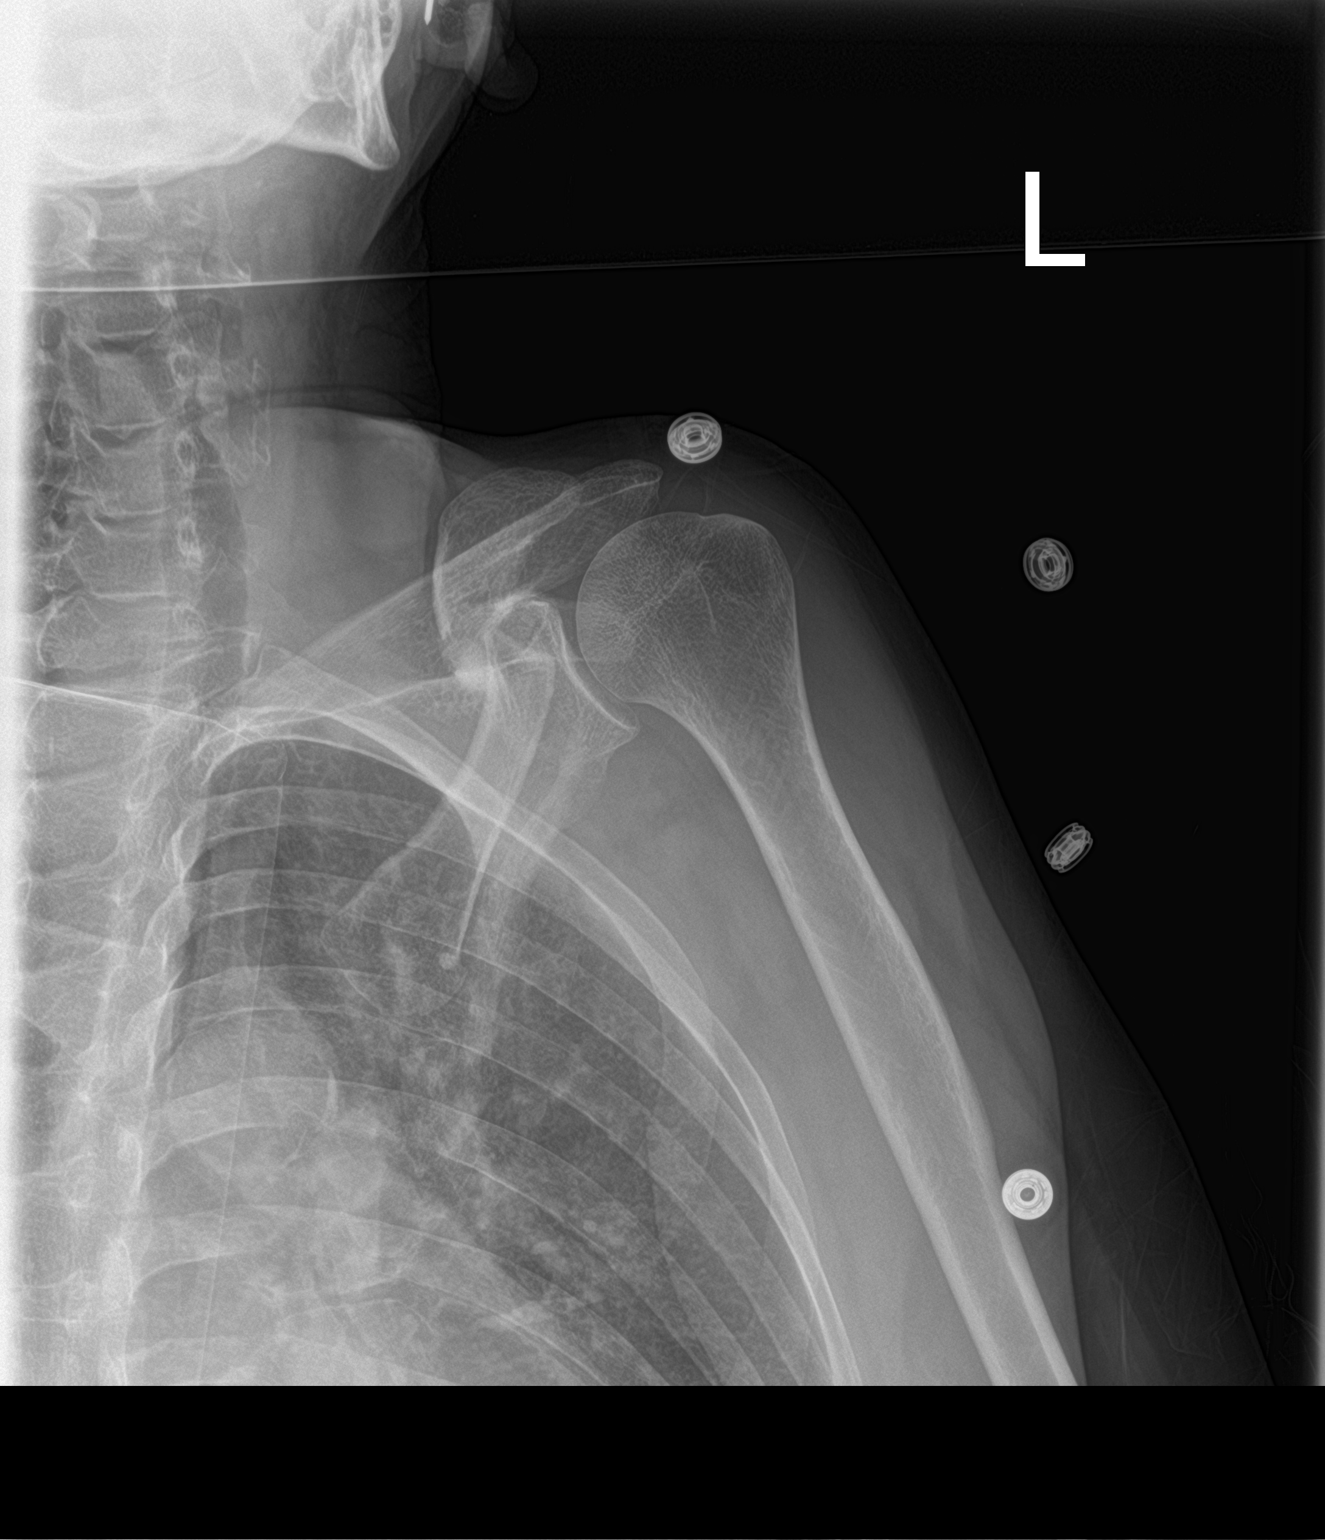

[shoulder y view]
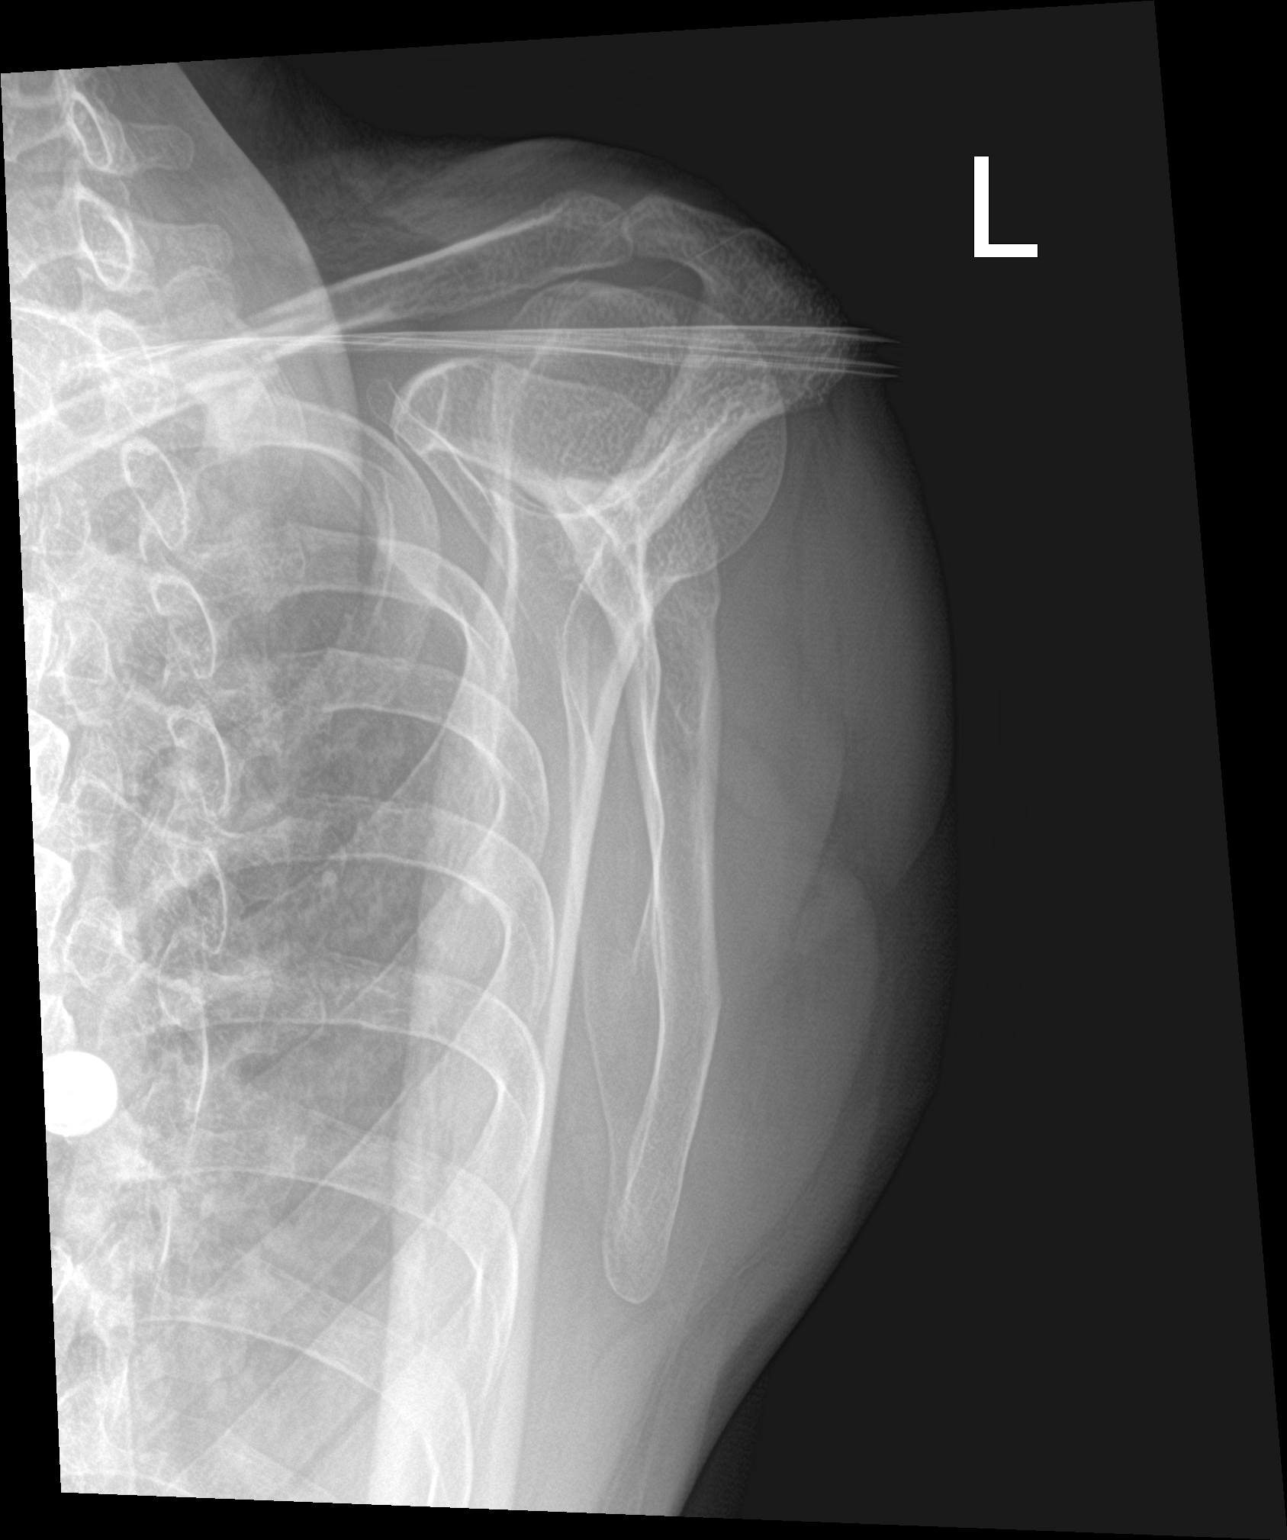

[2 of 2 positions shown; findings below may reference images not displayed]

FINDINGS: Mid left clavicle fracture is noted, better seen on clavicle series
today. AC and glenohumeral joints are intact.
IMPRESSION: Mid left clavicle fracture.

## 2019-10-27 MED ORDER — KETOROLAC TROMETHAMINE 30 MG/ML IJ SOLN
30.0000 mg | Freq: Once | INTRAMUSCULAR | Status: AC
  Administered 2019-10-27: 30 mg via INTRAVENOUS
  Filled 2019-10-27: qty 1

## 2019-10-27 MED ORDER — HYDROMORPHONE HCL 1 MG/ML IJ SOLN
0.5000 mg | Freq: Once | INTRAMUSCULAR | Status: AC
  Administered 2019-10-27: 0.5 mg via INTRAVENOUS
  Filled 2019-10-27: qty 1

## 2019-10-27 MED ORDER — ACETAMINOPHEN 500 MG PO TABS
1000.0000 mg | ORAL_TABLET | Freq: Once | ORAL | Status: DC
  Filled 2019-10-27: qty 2

## 2019-10-27 NOTE — ED Triage Notes (Addendum)
Pt to ED from horse stable, via EMS , apparently pt was doing jumps with horse, Pt fell of horse and the horse fell on pt left side and now pt c/o left shoulder and collar bone pain. No obvious injury per EMS,. No LOC. A&OX 4. #18 RAC- Fentanyl 50 mg given by EMS, 4MG  Zofran given. Last VS: BP 130/72, 98% RA, hr 73.

## 2019-10-27 NOTE — ED Notes (Signed)
Pt given 0.5 dilauded slow push at 2118. At 2119 pt had vagal response, became bradycardic, hypotensive, and hypoxic. Pt started on 3L oxygen, and 1L bolus.  Vernia Buff, PA made aware

## 2019-10-27 NOTE — Discharge Instructions (Addendum)
1. Medications: Alternate 600 mg of ibuprofen and [phone] mg of Tylenol every 3-6 hours as needed for pain. Do not exceed 4000 mg of Tylenol daily.  Take ibuprofen with food to avoid upset stomach issues.  The muscle relaxer that was prescribed to you can also cause drowsiness so be careful not to operate a motor vehicle or heavy machinery. 2. Treatment: rest, apply ice 20 minutes at a time a few times daily., elevate the extremity, wear the shoulder sling at all times except when showering.  Use the incentive spirometer at least once daily to avoid potential development of pneumonia. 3. Follow Up: Please followup with Dr. Griffin Basil with orthopedics tomorrow in the office at 8 AM; Please return to the ER for worsening symptoms or other concerns such as worsening swelling, severe headaches, redness of the skin, fevers, loss of pulses, or loss of feeling.

## 2019-10-27 NOTE — ED Provider Notes (Signed)
Patient seen by myself in conjunction with PA Joldersma. Refer to this note for provider history and physical examination.  Briefly patient is a 54 year old female presenting after being thrown off of a horse, with the were subsequently falling on top of her.  No head injury or loss of consciousness and she was wearing. head protection.  She exhibits no signs of serious head injury and denies headache or vision changes.  No numbness or weakness of the extremities.  She denies chest pain, shortness of breath, abdominal pain nausea or vomiting.  No midline spine tenderness on examination and examination of the chest and abdomen and pelvis is benign.  Physical examination is significant for ecchymosis and deformity involving the left clavicle.  She is right-hand dominant.  She is neurovascularly intact.  Radiographs show displaced mid clavicle fracture.  CONSULT: Spoke with Dr. Griffin Basil, he recommends shoulder sling and will see the patient in the office tomorrow at 8 AM.   The patient already has a muscle relaxer at home after being seen recently for musculoskeletal pains.  She states that she is sensitive to most pain medicines but has had a difficult time controlling her pain in the ED and is requesting a small amount of pain medicine at this time.  She was given 0.5 mg of Dilaudid IV and became briefly bradycardic, hypotensive and hypoxic and lost consciousness for approximately 15 seconds before regaining consciousness.  She was placed on supplemental oxygen and placed in Trendelenburg position.  She tells me that just prior to her loss of consciousness she began to feel flushed.  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  She states that she has lost consciousness like this previously.  I suspect that she had an episode of vasovagal syncope.  We will give her IV fluids and observe her.  11:00PM On reevaluation she is resting  comfortably in no apparent distress, reports that she is feeling much better.  Her pain is controlled and she remains neurovascularly intact.  She is tolerating [place] sandwich and fluids in the ED without difficulty.  She was ambulated in the ED with steady gait and balance, no complaint of lightheadedness.  She is hemodynamically stable and in no distress, understands to go to Dr. Rich Fuchs office tomorrow morning for reevaluation.  Discussed strict ED return precautions.  Patient and visitor at the bedside verbalized understanding of and agreement with plan and patient is stable for discharge at this time.   Renita Papa, PA-C 10/27/19 2334    Drenda Freeze, MD 10/28/19 [phone]

## 2019-10-27 NOTE — Progress Notes (Signed)
Orthopedic Tech Progress Note Patient Details:  [name] Nov 23, 1965 [id number]  Ortho Devices Type of Ortho Device: Sling immobilizer Ortho Device/Splint Location: LUE Ortho Device/Splint Interventions: Application   Post Interventions Patient Tolerated: Well Instructions Provided: Adjustment of device   Ruble Buttler E Wilver Tignor 10/27/2019, 10:49 PM

## 2019-10-27 NOTE — ED Provider Notes (Signed)
[facility] EMERGENCY DEPARTMENT Provider Note   CSN: [id number] Arrival date & time: 10/27/19  1808     History Chief Complaint  Patient presents with   Shoulder Pain    left-   Fall    off horse    [name] is a 54 y.o. female.  HPI HPI Comments: [name] is a 54 y.o. female who presents to the Emergency Department complaining of a fall that occurred prior to arrival.  Patient was wearing a helmet and riding a horse and the horse "went over a jump" and tripped resulting in the patient falling to the ground and the horse then pinning the patient to the ground.  Her instructor was present and states the horse was lying on the patient's chest for "1 minute".  EMS was notified and placed a c-collar on the patient.  She denies any acute neck pain.  No head trauma.  No loss of consciousness.  Her instructor notes that she immediately yelled for help and denies she has been confused or not at baseline since the incident occurred.  She was given 50 mcg of fentanyl and 4 mg of Zofran in route.  She denies any nausea or vomiting.  Patient reports severe left clavicular pain with some mild ecchymosis and edema noted overlying the site.  She also reports some mild left lower anterior chest wall pain.  Her pain worsens with any movement or palpation of the regions.  She denies shortness of breath, abdominal pain, diarrhea, syncope, dizziness, lightheadedness.     No past medical history on file.  There are no problems to display for this patient.   Past Surgical History:  Procedure Laterality Date   ABDOMINAL HYSTERECTOMY     APPENDECTOMY     bowel removal     5 inches   BOWEL RESECTION     2 feet     OB History   No obstetric history on file.     Family History  Problem Relation Age of Onset   Breast cancer Neg Hx     Social History   Tobacco Use   Smoking status: Never Smoker   Smokeless tobacco: Never Used  Substance Use  Topics   Alcohol use: No   Drug use: No    Home Medications Prior to Admission medications   Medication Sig Start Date End Date Taking? Authorizing Provider  ALPRAZolam Duanne Moron) 0.25 MG tablet Take 0.25 mg by mouth at bedtime as needed for anxiety.    [provider]  Calcium Carbonate-Vitamin D (CALCIUM + D PO) Take 1 tablet by mouth daily.    [provider]  cholestyramine light (PREVALITE) 4 G packet Take 4 g by mouth daily.    [provider]  clonazePAM (KLONOPIN) 0.5 MG tablet Take 0.5 mg by mouth 2 (two) times daily as needed for anxiety.    [provider]  diclofenac Sodium (VOLTAREN) 1 % GEL Apply 2 g topically daily as needed (pain).    [provider]  escitalopram (LEXAPRO) 20 MG tablet Take 30 mg by mouth daily. 10/18/19   [provider]  ibuprofen (ADVIL) 200 MG tablet Take 200-800 mg by mouth every 6 (six) hours as needed for moderate pain.    [provider]  ipratropium (ATROVENT) 0.06 % nasal spray Place 2 sprays into both nostrils daily as needed for rhinitis.    [provider]  melatonin 3 MG TABS tablet Take 3 mg by mouth  at bedtime as needed (sleep).    [provider]  meloxicam (MOBIC) 15 MG tablet Take 15 mg by mouth daily as needed for pain.    [provider]  metaxalone (SKELAXIN) 400 MG tablet Take 1 tablet (400 mg total) by mouth 3 (three) times daily as needed for muscle spasms. 10/21/19   Sherwood Gambler, MD  omeprazole (PRILOSEC) 20 MG capsule Take 20 mg by mouth daily as needed (heart burn).    [provider]  promethazine (PHENERGAN) 12.5 MG tablet Take 12.5 mg by mouth every 6 (six) hours as needed for nausea or vomiting.    [provider]    Allergies    Sulfa antibiotics, Wellbutrin [bupropion], and Zosyn [piperacillin sod-tazobactam so]  Review of Systems   Review of Systems  Respiratory: Negative for shortness of breath.   Cardiovascular:  Positive for chest pain.  Musculoskeletal: Positive for arthralgias, joint swelling and myalgias. Negative for back pain, neck pain and neck stiffness.  Skin: Positive for color change and wound.  Neurological: Negative for dizziness, syncope, weakness, light-headedness, numbness and headaches.  Psychiatric/Behavioral: Negative for confusion.   Physical Exam Updated Vital Signs BP 106/70 (BP Location: Right Arm)    Pulse 74    Temp 98.1 F (36.7 C) (Oral)    Resp 16    Ht 5\' 4"  (1.626 m)    Wt 62.6 kg    LMP 11/18/2012    SpO2 94%    BMI 23.69 kg/m   Physical Exam Vitals and nursing note reviewed.  Constitutional:      General: She is in acute distress.     Appearance: Normal appearance. She is normal weight. She is not ill-appearing, toxic-appearing or diaphoretic.  HENT:     Head: Normocephalic and atraumatic.     Right Ear: Tympanic membrane, ear canal and external ear normal. There is no impacted cerumen.     Left Ear: Tympanic membrane, ear canal and external ear normal. There is no impacted cerumen.     Nose: Nose normal.     Mouth/Throat:     Mouth: Mucous membranes are moist.     Pharynx: Oropharynx is clear. No oropharyngeal exudate or posterior oropharyngeal erythema.  Eyes:     General: No scleral icterus.       Right eye: No discharge.        Left eye: No discharge.     Extraocular Movements: Extraocular movements intact.     Conjunctiva/sclera: Conjunctivae normal.     Pupils: Pupils are equal, round, and reactive to light.  Neck:     Comments: Patient presented in a c-collar.  Patient cleared per Nexus criteria.  No midline cervical spine tenderness.  No tenderness of the cervical paraspinal musculature.  No visible trauma. Cardiovascular:     Rate and Rhythm: Normal rate and regular rhythm.     Pulses: Normal pulses.     Heart sounds: Normal heart sounds. No murmur. No friction rub. No gallop.   Pulmonary:     Effort: Pulmonary effort is normal. No respiratory  distress.     Breath sounds: Normal breath sounds. No stridor. No wheezing, rhonchi or rales.     Comments: Lungs clear to auscultation bilaterally.  Good symmetrical rise and fall the chest.  No paradoxical movement.  Mild TTP noted to the left inferior anterior chest wall.  Otherwise, no chest wall tenderness. Chest:     Chest wall: No tenderness.  Abdominal:     General: Abdomen is  flat.     Palpations: Abdomen is soft.     Tenderness: There is no abdominal tenderness.  Musculoskeletal:        General: Swelling, tenderness, deformity and signs of injury present.     Cervical back: Normal range of motion and neck supple. No rigidity or tenderness.     Comments: Exquisite pain with even light palpation to the left clavicular region.  Moderate edema and ecchymosis noted at the prior mentioned site.  Unable to assess range of motion of the left upper extremity due to pain.  Skin:    General: Skin is warm and dry.     Capillary Refill: Capillary refill takes less than 2 seconds.  Neurological:     General: No focal deficit present.     Mental Status: She is alert and oriented to person, place, and time.     Comments: Patient is speaking clearly and coherently.  She is oriented to person, place, time.  Distal sensation is intact.  Patient is able to move fingers of her hands bilaterally.    Psychiatric:        Mood and Affect: Mood normal.        Behavior: Behavior normal.    ED Results / Procedures / Treatments   Labs (all labs ordered are listed, but only abnormal results are displayed) Labs Reviewed - No data to display  EKG None  Radiology DG Ribs Unilateral W/Chest Left  Result Date: 10/27/2019 CLINICAL DATA:  Fall from horse, left shoulder pain EXAM: LEFT RIBS AND CHEST - 3+ VIEW COMPARISON:  None. FINDINGS: Stool heart is normal size. Lungs are clear. No effusions or pneumothorax. Mid left clavicle fracture is noted. No visible rib fracture. IMPRESSION: Left clavicle fracture.   No visible rib fracture. No acute cardiopulmonary disease. Electronically Signed   By: Rolm Baptise M.D.   On: 10/27/2019 20:27   DG Clavicle Left  Result Date: 10/27/2019 CLINICAL DATA:  Fall from horse.  Left clavicle and shoulder pain EXAM: LEFT CLAVICLE - 2+ VIEWS COMPARISON:  None. FINDINGS: There is a mid left clavicle fracture, mildly comminuted and displaced. AC and glenohumeral joints appear intact. IMPRESSION: Comminuted, displaced mid left clavicle fracture. Electronically Signed   By: Rolm Baptise M.D.   On: 10/27/2019 20:26   DG Shoulder Left  Result Date: 10/27/2019 CLINICAL DATA:  Fall from horse EXAM: LEFT SHOULDER - 2+ VIEW COMPARISON:  None. FINDINGS: Mid left clavicle fracture is noted, better seen on clavicle series today. AC and glenohumeral joints are intact. IMPRESSION: Mid left clavicle fracture. Electronically Signed   By: Rolm Baptise M.D.   On: 10/27/2019 20:28    Procedures Procedures   Medications Ordered in ED Medications  HYDROmorphone (DILAUDID) injection 0.5 mg (0.5 mg Intravenous Given 10/27/19 2118)  ketorolac (TORADOL) 30 MG/ML injection 30 mg (30 mg Intravenous Given 10/27/19 2120)    ED Course  I have reviewed the triage vital signs and the nursing notes.  Pertinent labs & imaging results that were available during my care of the patient were reviewed by me and considered in my medical decision making (see chart for details).    MDM Rules/Calculators/A&P                      7:07 PM patient is a pleasant 54 year old female who fell from a horse just prior to arrival.  Her pain is well controlled with 50 mcg of fentanyl which she was given in route via  EMS.  I am concerned she likely fractured her left clavicle.  Her physical exam is otherwise reassuring.  Will obtain images of her left clavicle, left shoulder, left ribs and chest.  We will closely monitor and reassess.  8:32 PM x-rays show comminuted, displaced mid left clavicle fracture.  Will discuss  with Dr. Griffin Basil regarding this patient.  9:25 PM Dr. Griffin Basil states we can place patient in a sling.  He will see patient at 8 AM tomorrow morning.  She is likely going to need surgery.  I discussed this with the patient.  Also discussed continued pain management with Tylenol and ibuprofen as needed.  Will place patient in a shoulder sling.  Will give incentive spirometer.   9:29 PM patient states her pain was returning and was given 1/2 mg of Dilaudid IV prior to discharge.  RN came to discuss stating that patient immediately became bradycardic, hypotensive and her saturations decreased to 85%.  She lost consciousness for 10 to 15 seconds.  She was placed on supplemental oxygen via nasal cannula.  We discussed with patient and her friend and they states she is "extremely scared of needles" and has lost consciousness many times in the past due to this.  Patient states she felt flush right before losing consciousness.  We will monitor the patient and if she improves will discharge.  10:27 PM patient vital signs of improved.  She states she is starting to feel better.  She states her sister is going to stay with her tonight and make sure she gets to her appointment tomorrow morning with Dr. Griffin Basil.  I will have the nursing staff ambulate patient and monitor saturations.  If she is able to ambulate we will have her placed in a shoulder sling and discharge.  She verbalized understanding of the above plan and was amicable.  11:06 PM nursing staff had patient ambulate and she tolerated well.  She is ready to be discharged.   Final Clinical Impression(s) / ED Diagnoses Final diagnoses:  Closed displaced fracture of shaft of left clavicle, initial encounter   Rx / DC Orders ED Discharge Orders    None       Rayna Sexton, PA-C 10/27/19 2331    Drenda Freeze, MD 10/28/19 [phone]

## 2019-10-27 NOTE — ED Notes (Signed)
Pt able to ambulate in hall without complaint or any impaired gait. No endorsement of dizziness, or light-headedness.   Also, no complaints of N/V after eating food

## 2020-05-28 ENCOUNTER — Other Ambulatory Visit: Payer: Self-pay | Admitting: Surgery

## 2020-05-28 DIAGNOSIS — K529 Noninfective gastroenteritis and colitis, unspecified: Secondary | ICD-10-CM

## 2020-06-07 ENCOUNTER — Other Ambulatory Visit: Payer: Self-pay

## 2020-06-07 ENCOUNTER — Ambulatory Visit (HOSPITAL_COMMUNITY)
Admission: RE | Admit: 2020-06-07 | Discharge: 2020-06-07 | Disposition: A | Discharge status: Home / Self Care | Payer: BC Managed Care – PPO | Source: Ambulatory Visit | Attending: Surgery | Admitting: Surgery

## 2020-06-07 DIAGNOSIS — K529 Noninfective gastroenteritis and colitis, unspecified: Secondary | ICD-10-CM | POA: Insufficient documentation

## 2020-06-07 IMAGING — NM NM HEPATO W/GB/PHARM/[PERSON_NAME]
2 series · 12 of 12 positions shown · non-contrast
Comparison: None.

CLINICAL DATA: Postprandial right upper quadrant abdominal pain and
diarrhea.

EXAM:
NUCLEAR MEDICINE HEPATOBILIARY IMAGING WITH GALLBLADDER EF
TECHNIQUE: Sequential images of the abdomen were obtained [DATE] minutes
following intravenous administration of radiopharmaceutical. After
oral ingestion of Ensure, gallbladder ejection fraction was
determined. At 60 min, normal ejection fraction is greater than 33%.
RADIOPHARMACEUTICALS:  5.1 mCi [TK]  Choletec IV

[Series 1: hida scan 1hr · 3.28mm/px · 6 of 60 frames shown]
[frame 6/60]
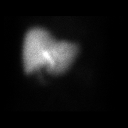
[frame 16/60]
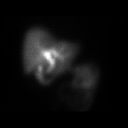
[frame 26/60]
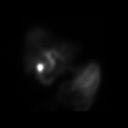
[frame 36/60]
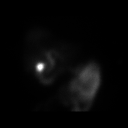
[frame 46/60]
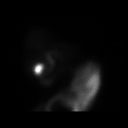
[frame 56/60]
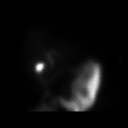

[Series 1: hida gbef ensure · 3.28mm/px · 6 of 60 frames shown]
[frame 6/60]
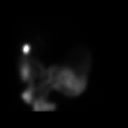
[frame 16/60]
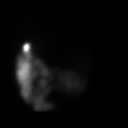
[frame 26/60]
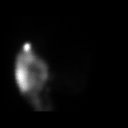
[frame 36/60]
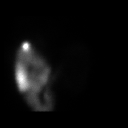
[frame 46/60]
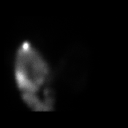
[frame 56/60]
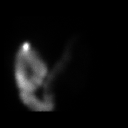

[12 of 12 positions shown; findings below may reference images not displayed]

FINDINGS: Prompt uptake and biliary excretion of activity by the liver is
seen. Gallbladder activity is visualized, consistent with patency of
cystic duct. Biliary activity passes into small bowel, consistent
with patent common bile duct.

Calculated gallbladder ejection fraction is 54%. (Normal gallbladder
ejection fraction with Ensure is greater than 33%.)
IMPRESSION: Normal hepatobiliary scan, demonstrating patency of cystic and
common bile ducts.

Normal gallbladder ejection fraction.

## 2020-06-07 MED ORDER — TECHNETIUM TC 99M MEBROFENIN IV KIT
5.1000 | PACK | Freq: Once | INTRAVENOUS | Status: AC | PRN
  Administered 2020-06-07: 5.1 via INTRAVENOUS

## 2020-06-07 NOTE — Progress Notes (Signed)
Please call the patient and let them know that their HIDA scan was normal, so I don't think it would help her to remove her gallbladder.  She should see her GI doc at [place].  Make sure they have my note.Thanks

## 2020-07-03 ENCOUNTER — Other Ambulatory Visit: Payer: Self-pay | Admitting: Internal Medicine

## 2020-07-03 DIAGNOSIS — Z1231 Encounter for screening mammogram for malignant neoplasm of breast: Secondary | ICD-10-CM

## 2020-07-19 ENCOUNTER — Ambulatory Visit: Payer: BC Managed Care – PPO

## 2020-08-30 ENCOUNTER — Ambulatory Visit
Admission: RE | Admit: 2020-08-30 | Discharge: 2020-08-30 | Disposition: A | Discharge status: Home / Self Care | Payer: BC Managed Care – PPO | Source: Ambulatory Visit | Attending: Internal Medicine | Admitting: Internal Medicine

## 2020-08-30 ENCOUNTER — Other Ambulatory Visit: Payer: Self-pay

## 2020-08-30 DIAGNOSIS — Z1231 Encounter for screening mammogram for malignant neoplasm of breast: Secondary | ICD-10-CM

## 2020-08-30 IMAGING — MG MM DIGITAL SCREENING BILAT W/ TOMO AND CAD
8 series · 9 of 24 positions shown · non-contrast
Comparison: Previous exam(s).

CLINICAL DATA: Screening.

EXAM:
DIGITAL SCREENING BILATERAL MAMMOGRAM WITH TOMOSYNTHESIS AND CAD
TECHNIQUE: Bilateral screening digital craniocaudal and mediolateral oblique
mammograms were obtained. Bilateral screening digital breast
tomosynthesis was performed. The images were evaluated with
computer-aided detection.

[L CC synth-2D]
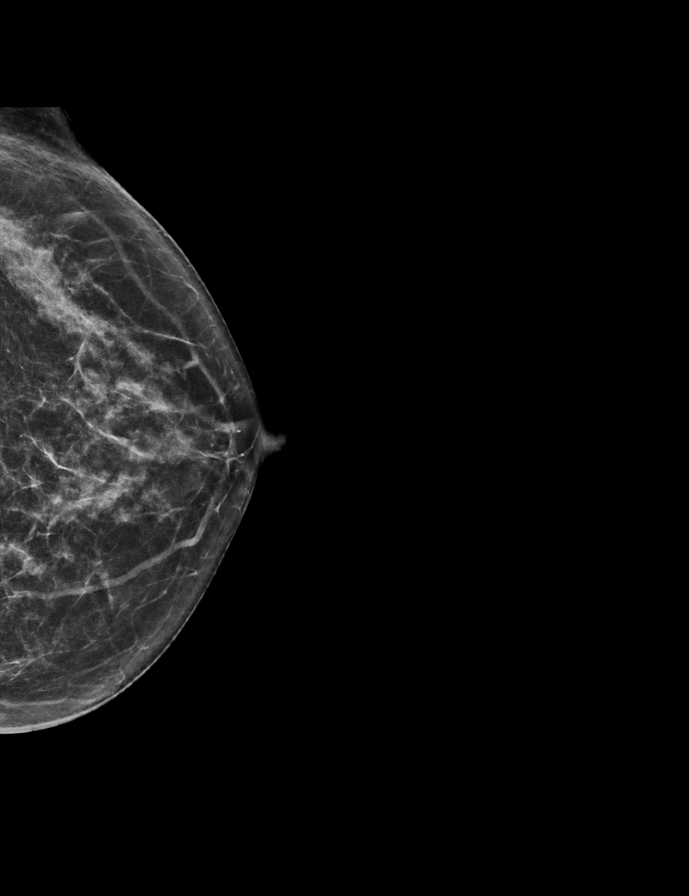

[L MLO synth-2D]
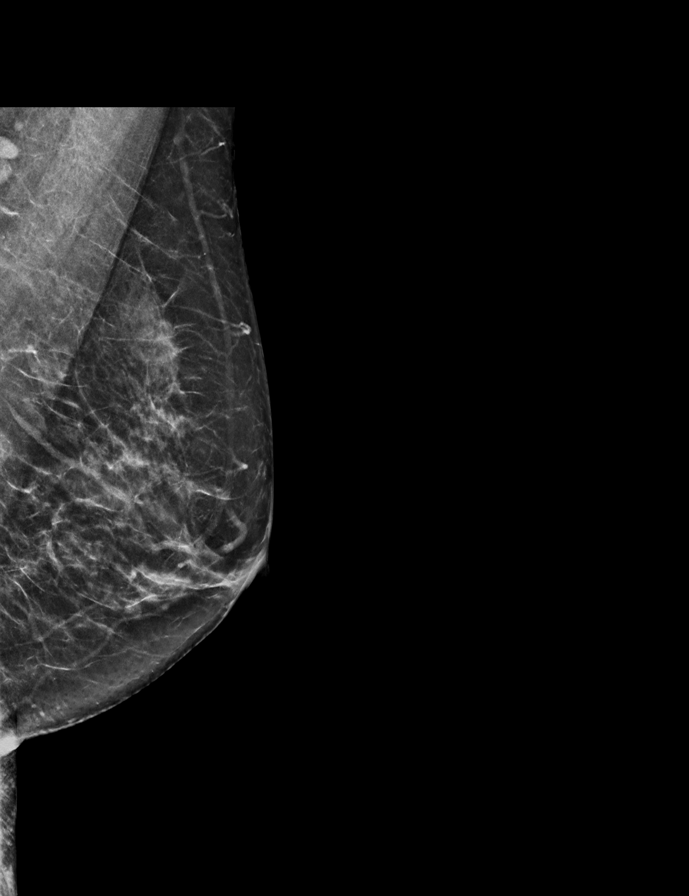

[R MLO synth-2D]
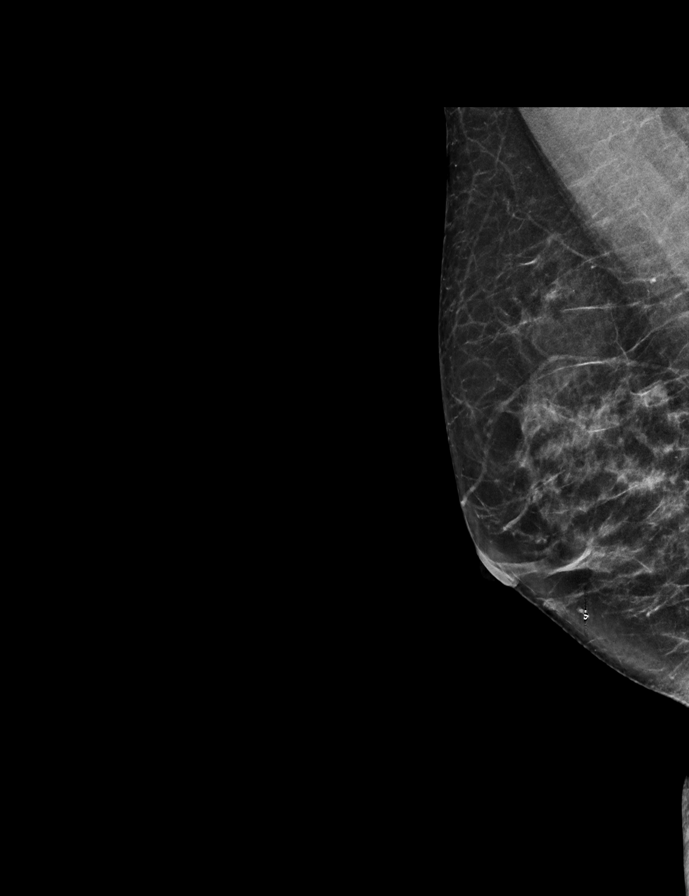

[R CC synth-2D]
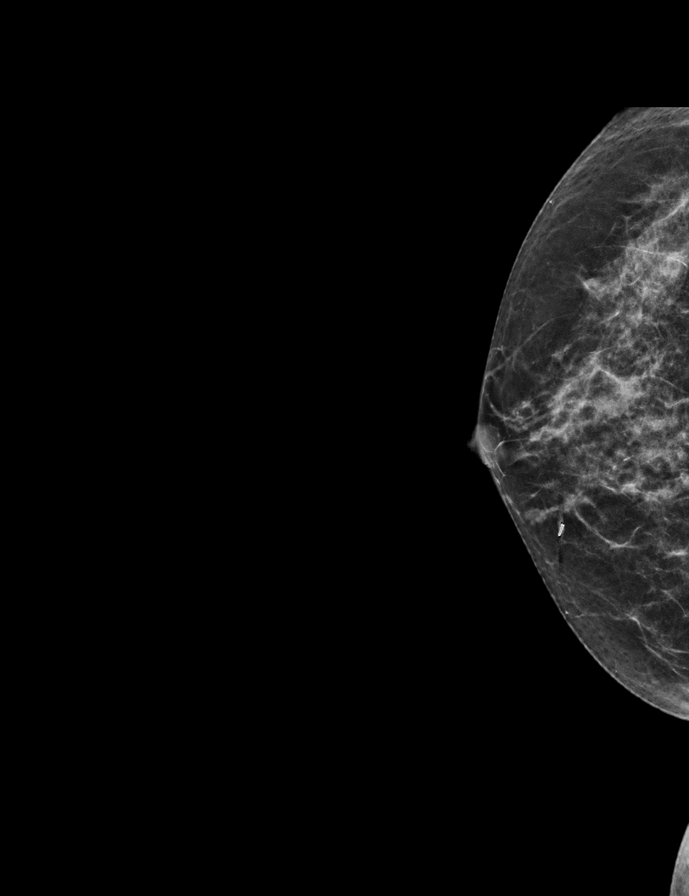

[R MLO tomo · 2 of 58 frames shown]
[frame 19/58]
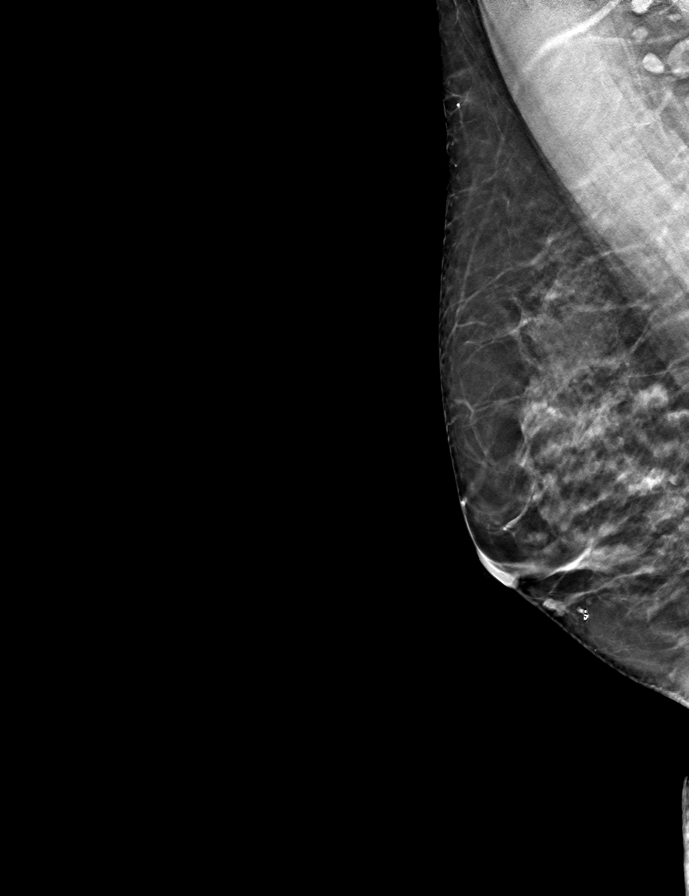
[frame 29/58]
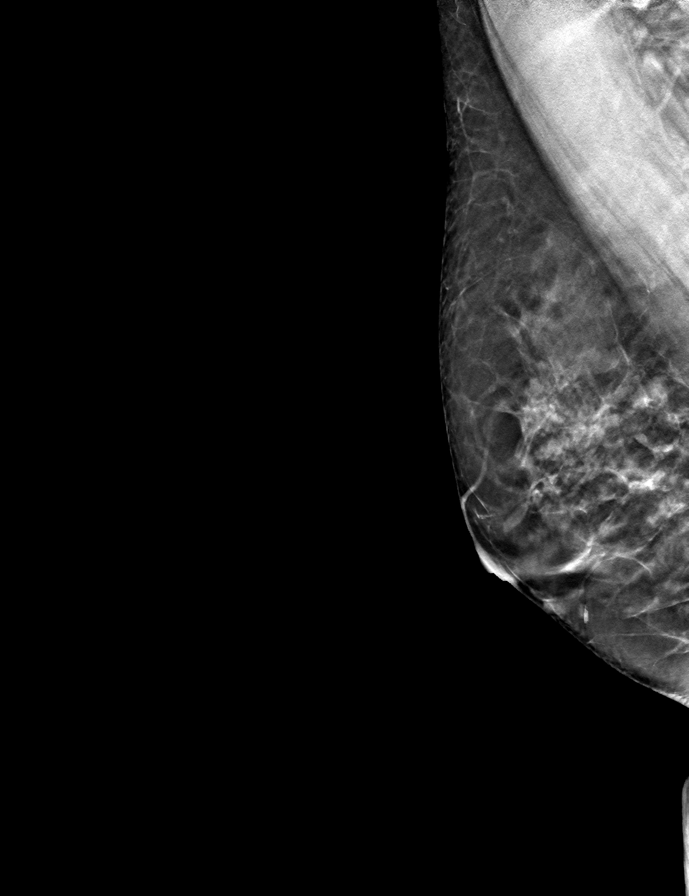

[L MLO tomo · tomo slice 29/57.0]
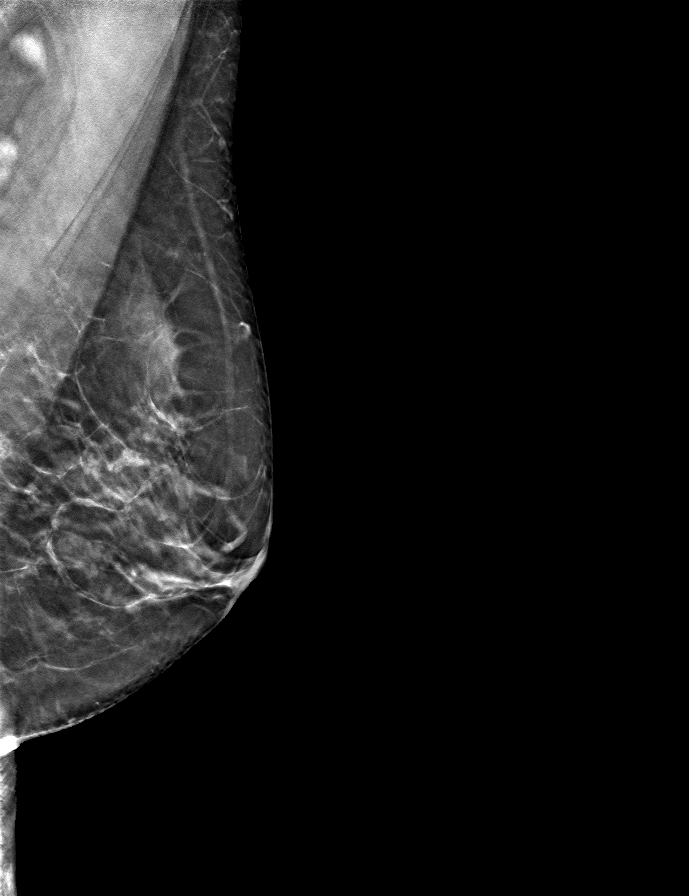

[R CC tomo · tomo slice 25/50.0]
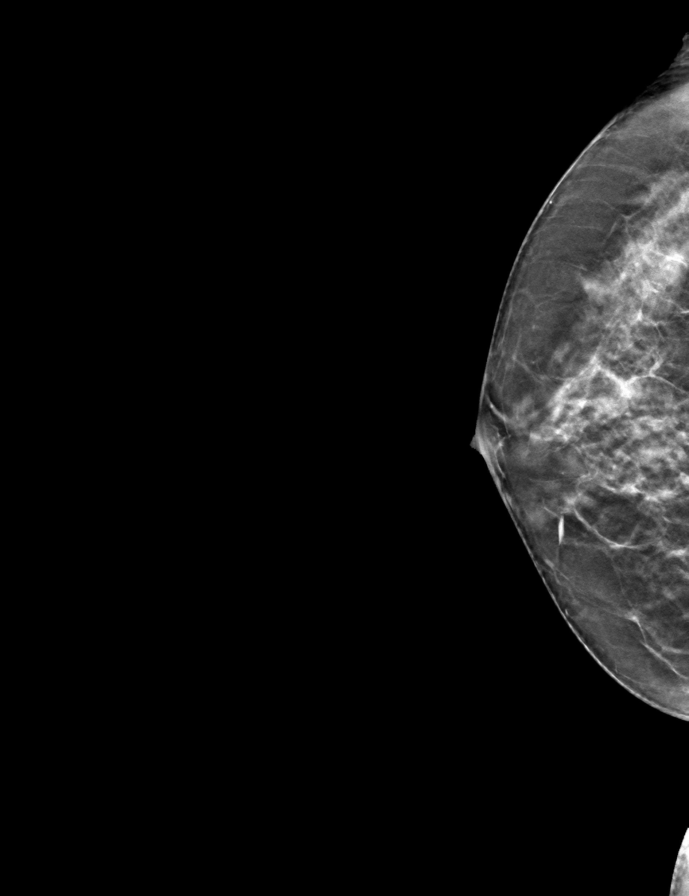

[L CC tomo · tomo slice 29/57.0]
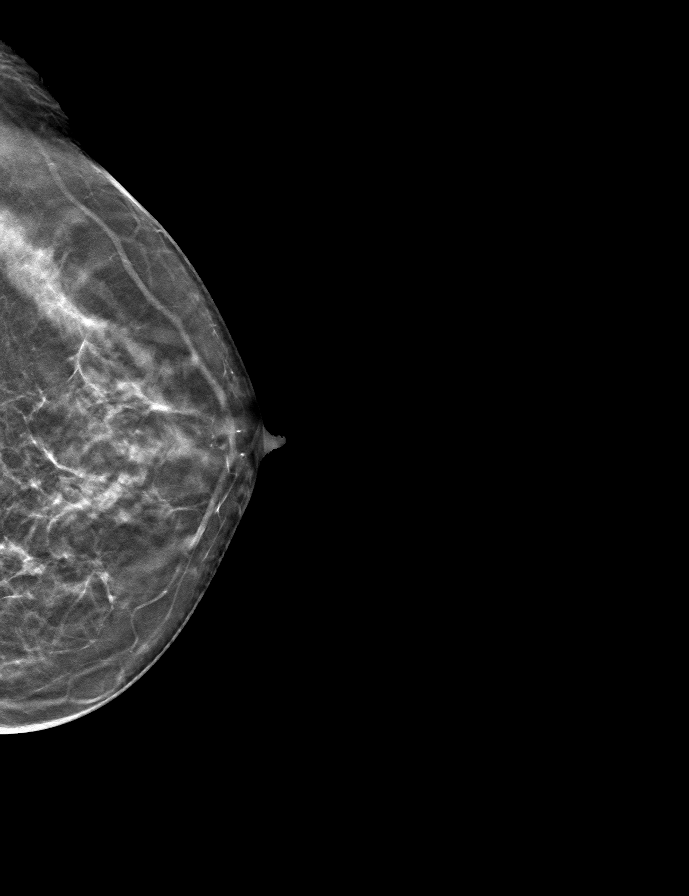

[9 of 24 positions shown; findings below may reference images not displayed]

ACR Breast Density Category c: The breast tissue is heterogeneously
dense, which may obscure small masses.
FINDINGS: There are no findings suspicious for malignancy.
IMPRESSION: No mammographic evidence of malignancy. A result letter of this
screening mammogram will be mailed directly to the patient.

RECOMMENDATION:
Screening mammogram in one year. (Code:[V2])

BI-RADS CATEGORY  1: Negative.

## 2021-02-28 ENCOUNTER — Emergency Department (HOSPITAL_COMMUNITY): Payer: BC Managed Care – PPO

## 2021-02-28 ENCOUNTER — Encounter (HOSPITAL_COMMUNITY): Payer: Self-pay

## 2021-02-28 ENCOUNTER — Emergency Department (HOSPITAL_COMMUNITY)
Admission: EM | Admit: 2021-02-28 | Discharge: 2021-02-28 | Disposition: A | Discharge status: Home / Self Care | Payer: BC Managed Care – PPO | Attending: Student | Admitting: Student

## 2021-02-28 ENCOUNTER — Other Ambulatory Visit: Payer: Self-pay

## 2021-02-28 DIAGNOSIS — R1011 Right upper quadrant pain: Secondary | ICD-10-CM | POA: Diagnosis not present

## 2021-02-28 DIAGNOSIS — R079 Chest pain, unspecified: Secondary | ICD-10-CM | POA: Insufficient documentation

## 2021-02-28 DIAGNOSIS — C9211 Chronic myeloid leukemia, BCR/ABL-positive, in remission: Secondary | ICD-10-CM | POA: Insufficient documentation

## 2021-02-28 DIAGNOSIS — C9210 Chronic myeloid leukemia, BCR/ABL-positive, not having achieved remission: Secondary | ICD-10-CM

## 2021-02-28 LAB — COMPREHENSIVE METABOLIC PANEL
ALT: 19 U/L (ref 0–44)
AST: 24 U/L (ref 15–41)
Albumin: 3.9 g/dL (ref 3.5–5.0)
Alkaline Phosphatase: 119 U/L (ref 38–126)
Anion gap: 9 (ref 5–15)
BUN: 17 mg/dL (ref 6–20)
CO2: 23 mmol/L (ref 22–32)
Calcium: 9.1 mg/dL (ref 8.9–10.3)
Chloride: 110 mmol/L (ref 98–111)
Creatinine, Ser: 0.85 mg/dL (ref 0.44–1.00)
GFR, Estimated: >60 mL/min (ref >60)
Glucose, Bld: 99 mg/dL (ref 70–99)
Potassium: 3.4 mmol/L — ABNORMAL LOW (ref 3.5–5.1)
Sodium: 142 mmol/L (ref 135–145)
Total Bilirubin: 0.5 mg/dL (ref 0.3–1.2)
Total Protein: 6.4 g/dL — ABNORMAL LOW (ref 6.5–8.1)

## 2021-02-28 LAB — CBC
HCT: 40.3 % (ref 36.0–46.0)
Hemoglobin: 13.6 g/dL (ref 12.0–15.0)
MCH: 33.5 pg (ref 26.0–34.0)
MCHC: 33.7 g/dL (ref 30.0–36.0)
MCV: 99.3 fL (ref 80.0–100.0)
Platelets: 311 10*3/uL (ref 150–400)
RBC: 4.06 MIL/uL (ref 3.87–5.11)
RDW: 13.7 % (ref 11.5–15.5)
WBC: 58.5 10*3/uL (ref 4.0–10.5)
nRBC: 0 % (ref 0.0–0.2)

## 2021-02-28 LAB — CBC WITH DIFFERENTIAL/PLATELET
Abs Immature Granulocytes: 10.68 10*3/uL — ABNORMAL HIGH (ref 0.00–0.07)
Basophils Absolute: 2 10*3/uL — ABNORMAL HIGH (ref 0.0–0.1)
Basophils Relative: 4 %
Eosinophils Absolute: 1 10*3/uL — ABNORMAL HIGH (ref 0.0–0.5)
Eosinophils Relative: 2 %
HCT: 39.3 % (ref 36.0–46.0)
Hemoglobin: 13.1 g/dL (ref 12.0–15.0)
Immature Granulocytes: 19 %
Lymphocytes Relative: 8 %
Lymphs Abs: 4.8 10*3/uL — ABNORMAL HIGH (ref 0.7–4.0)
MCH: 32.8 pg (ref 26.0–34.0)
MCHC: 33.3 g/dL (ref 30.0–36.0)
MCV: 98.5 fL (ref 80.0–100.0)
Monocytes Absolute: 2.9 10*3/uL — ABNORMAL HIGH (ref 0.1–1.0)
Monocytes Relative: 5 %
Neutro Abs: 36.2 10*3/uL — ABNORMAL HIGH (ref 1.7–7.7)
Neutrophils Relative %: 62 %
Platelets: 299 10*3/uL (ref 150–400)
RBC: 3.99 MIL/uL (ref 3.87–5.11)
RDW: 13.6 % (ref 11.5–15.5)
WBC: 57.6 10*3/uL (ref 4.0–10.5)
nRBC: 0.1 % (ref 0.0–0.2)

## 2021-02-28 LAB — I-STAT BETA HCG BLOOD, ED (MC, WL, AP ONLY): I-stat hCG, quantitative: <5 m[IU]/mL (ref <5)

## 2021-02-28 LAB — LIPASE, BLOOD: Lipase: 35 U/L (ref 11–51)

## 2021-02-28 LAB — TROPONIN I (HIGH SENSITIVITY)
Troponin I (High Sensitivity): 4 ng/L (ref <18)
Troponin I (High Sensitivity): 4 ng/L (ref <18)

## 2021-02-28 IMAGING — CR DG CHEST 2V
2 series · 2 of 2 positions shown · non-contrast
Comparison: Chest x-ray dated [DATE]

CLINICAL DATA: Chest pain

EXAM:
CHEST - 2 VIEW

[w chest pa]
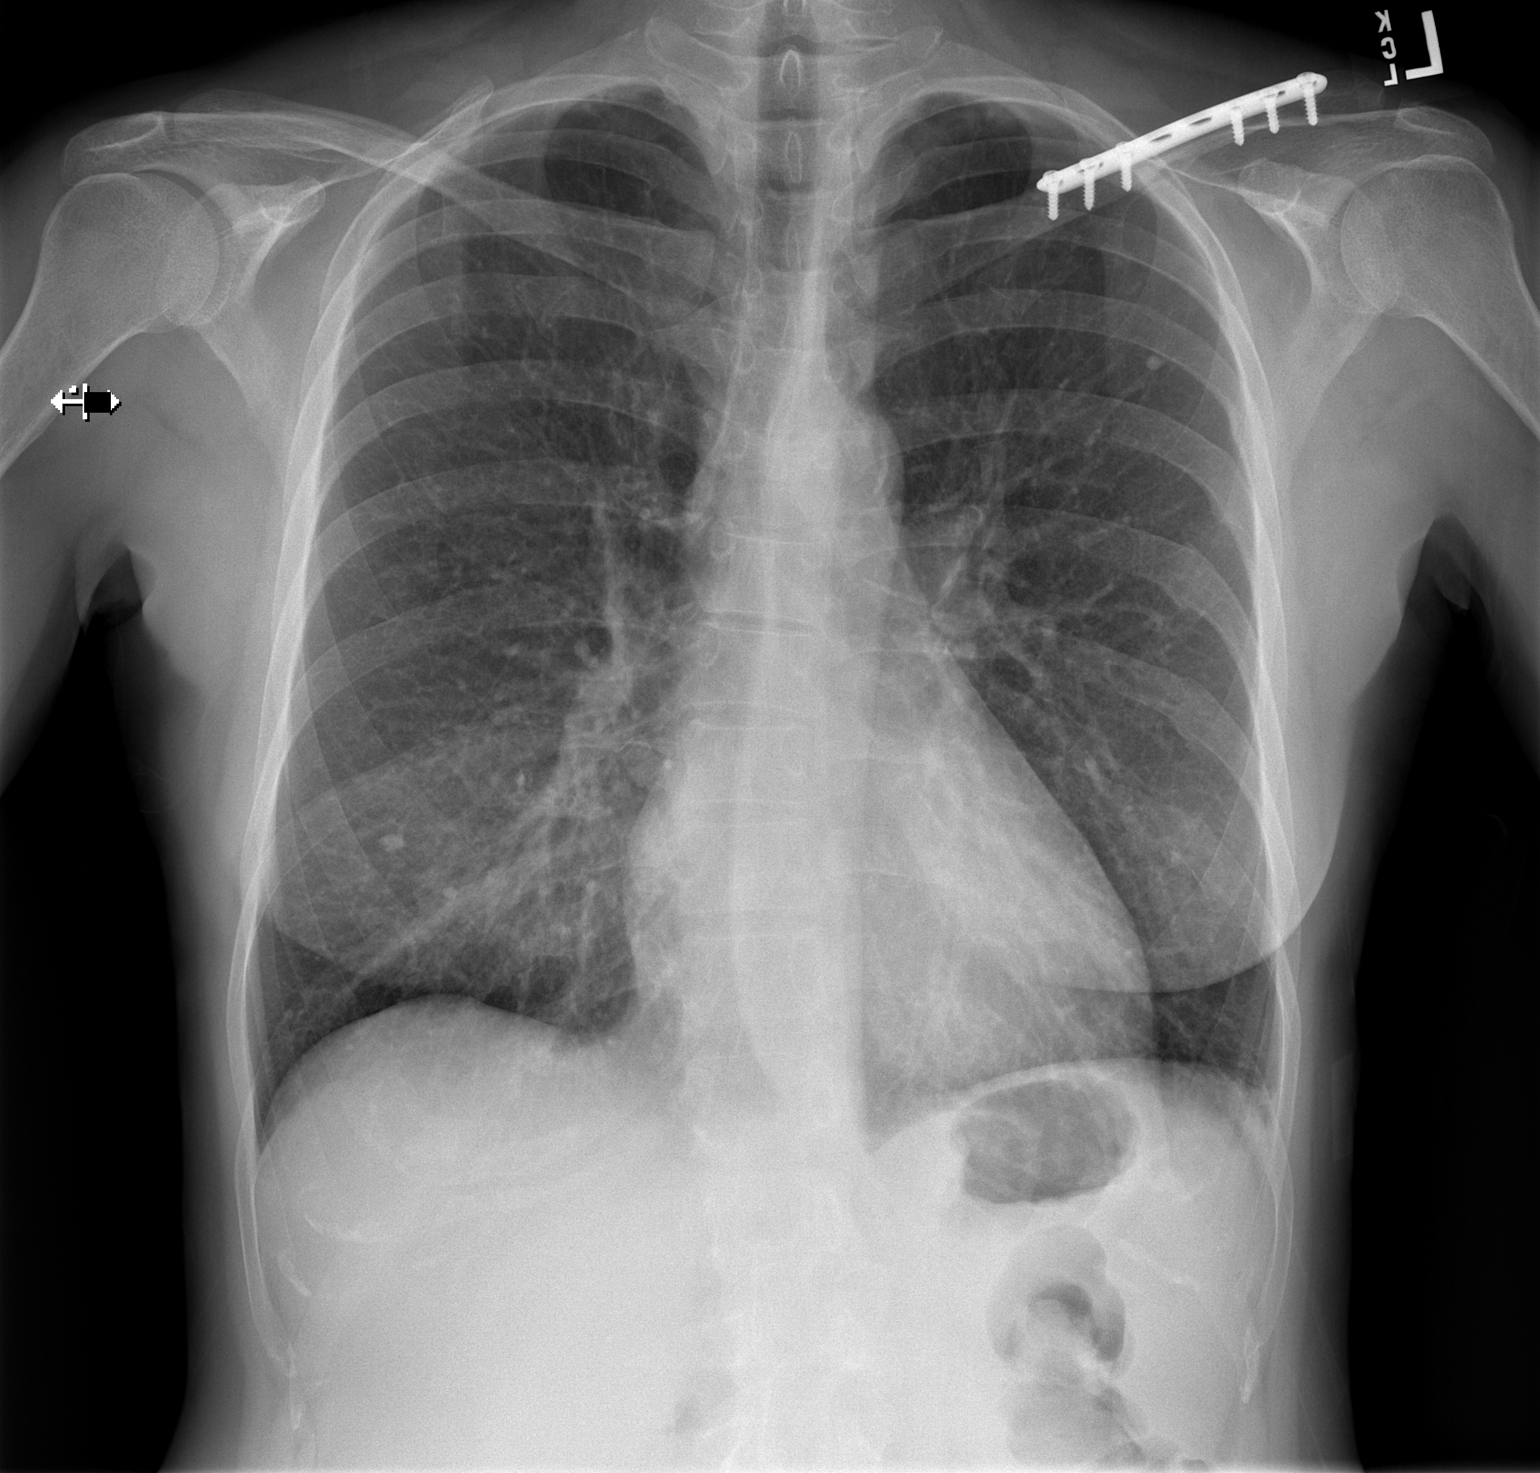

[w chest lat]
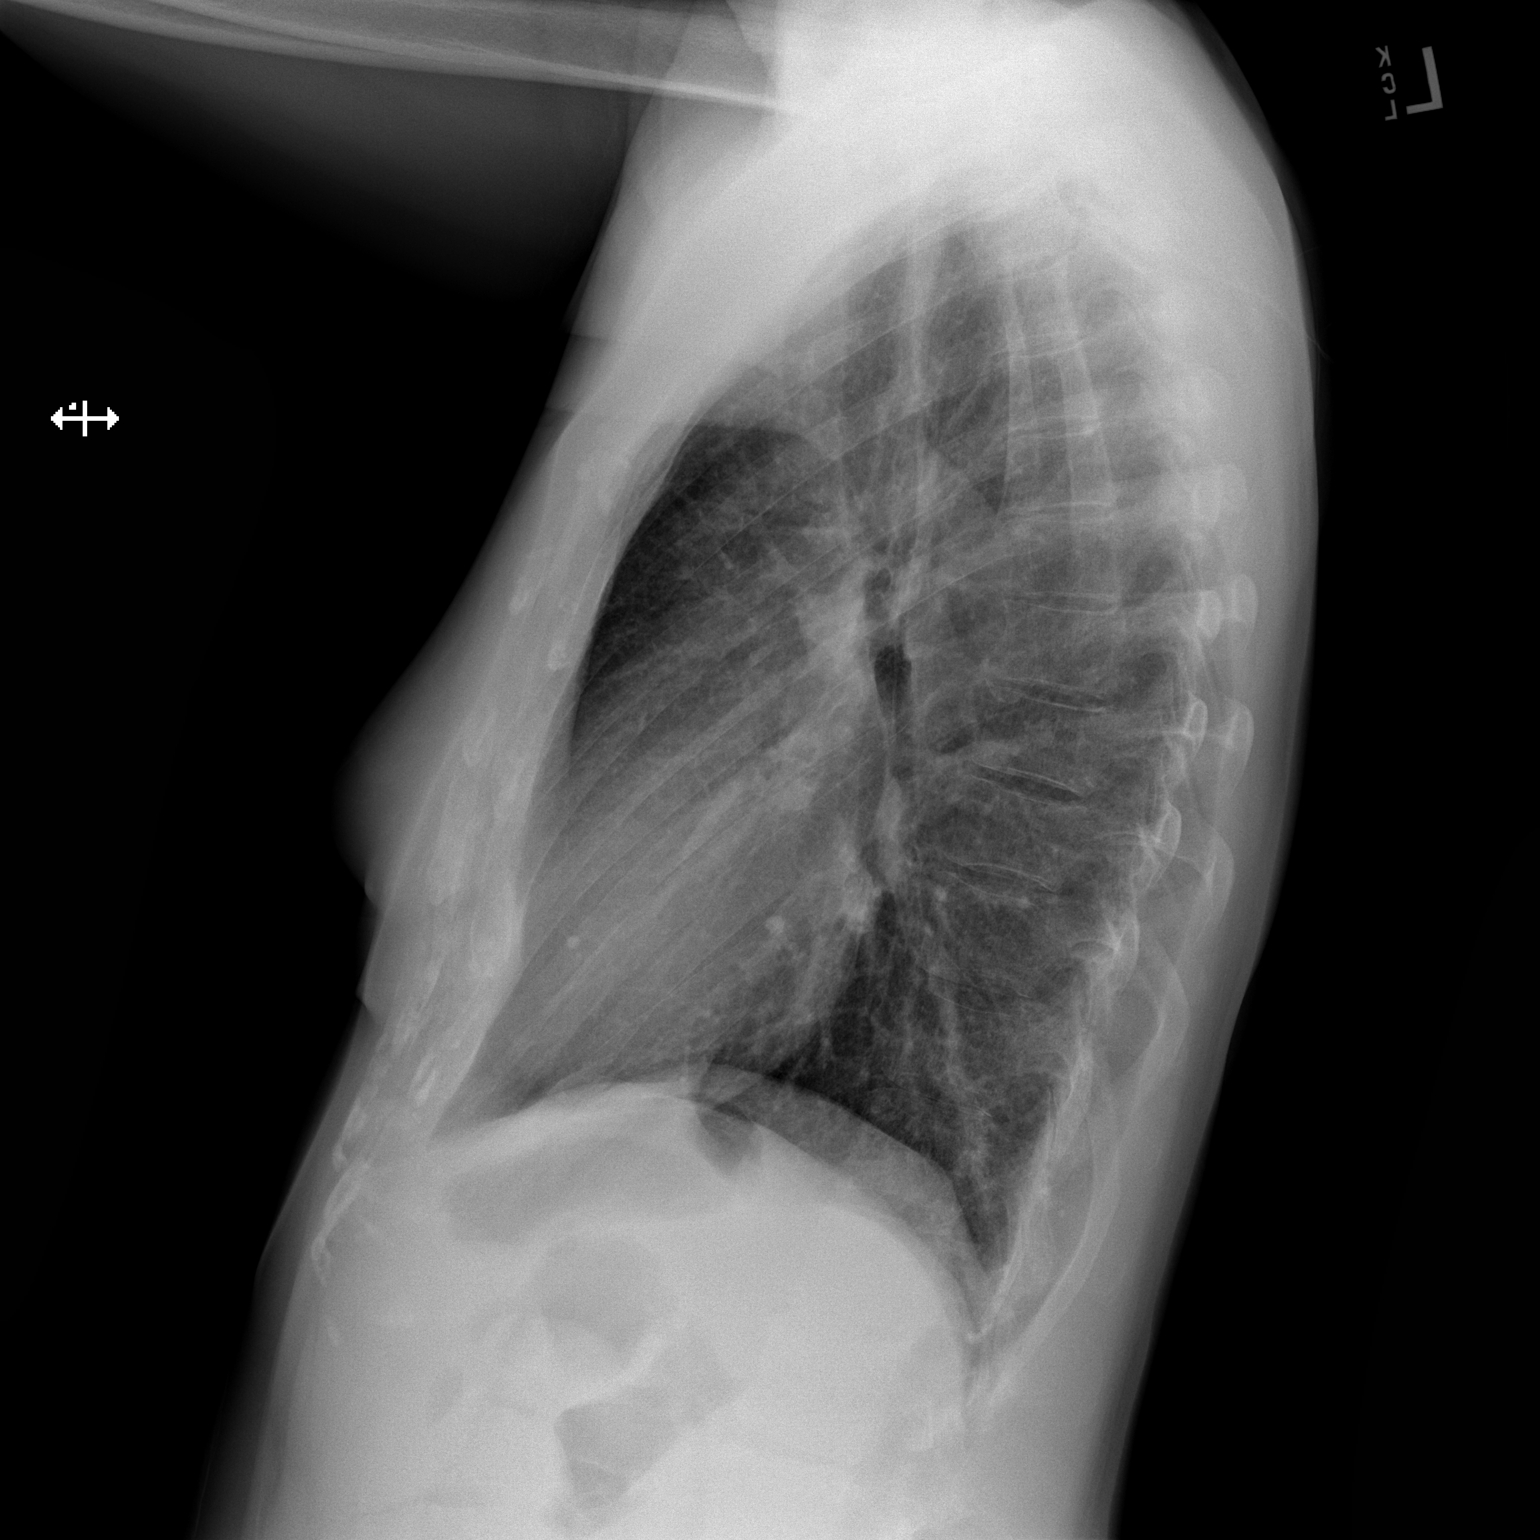

[2 of 2 positions shown; findings below may reference images not displayed]

FINDINGS: Cardiomediastinal contours within normal limits. Unchanged small
bilateral calcified nodules, likely granulomatous. Mild left basilar
atelectasis. Lungs are otherwise clear. Unchanged mild biapical
pleuroparenchymal scarring. No acute pleural abnormalities. Interval
plate and screw fixation of the left clavicle.
IMPRESSION: No active cardiopulmonary disease.

## 2021-02-28 IMAGING — US US ABDOMEN LIMITED
1 series · 15 of 25 positions shown · non-contrast
Comparison: CT [DATE]

CLINICAL DATA: Right upper quadrant pain.

EXAM:
ULTRASOUND ABDOMEN LIMITED RIGHT UPPER QUADRANT

[Series 1: us abdomen limited ruq mc & wl · 15 of 50 slices shown]
[im 1/50]
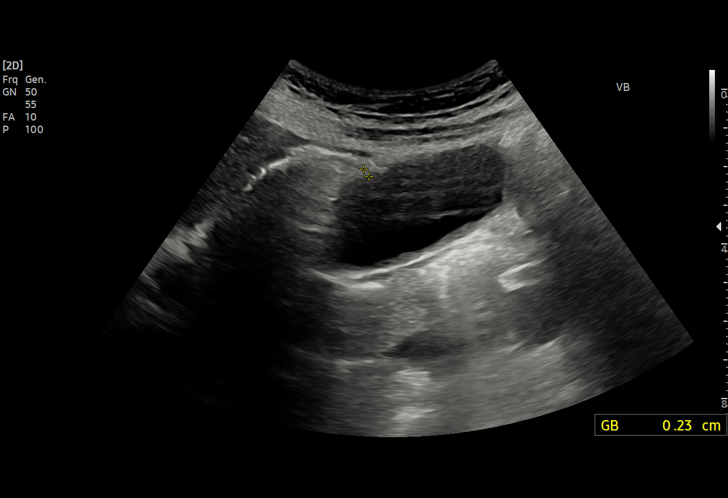
[im 5/50]
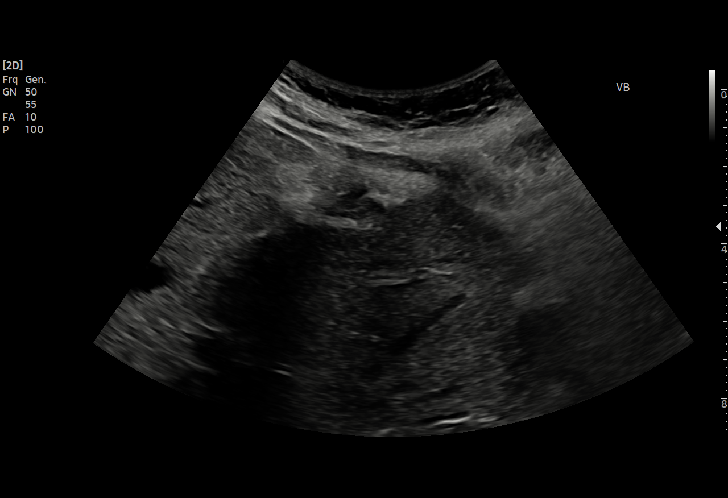
[im 9/50]
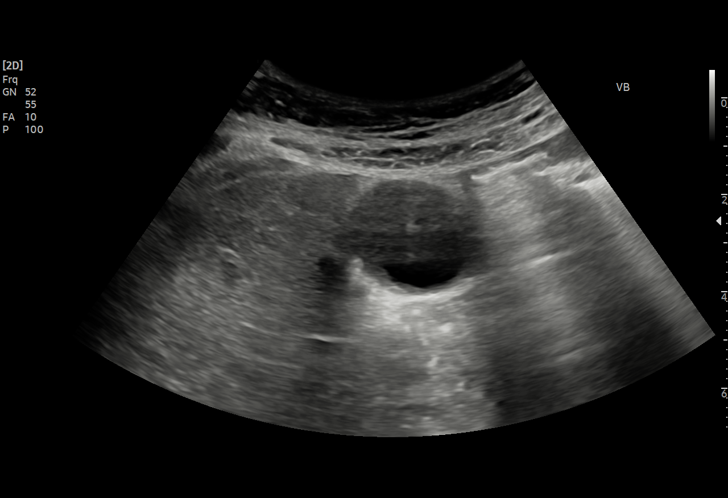
[im 11/50]
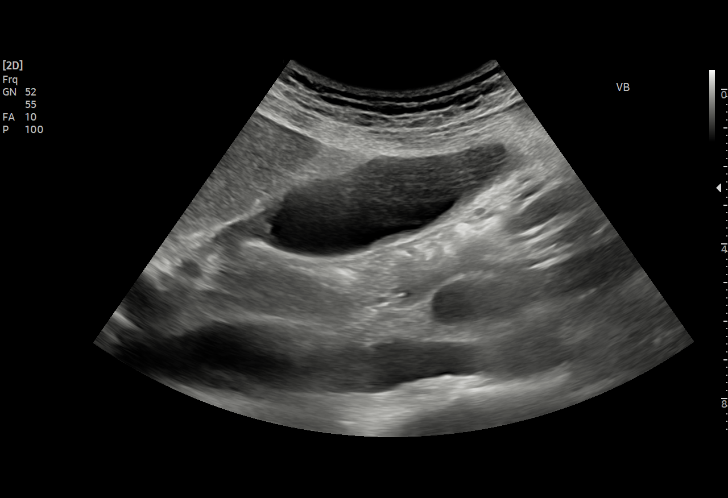
[im 15/50]
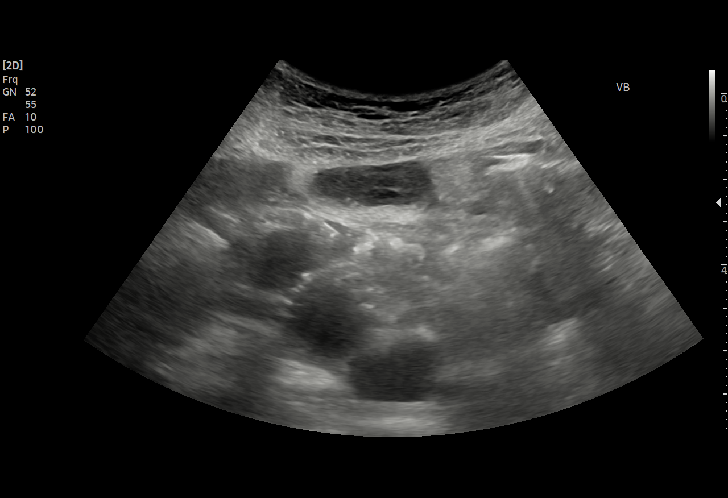
[im 19/50]
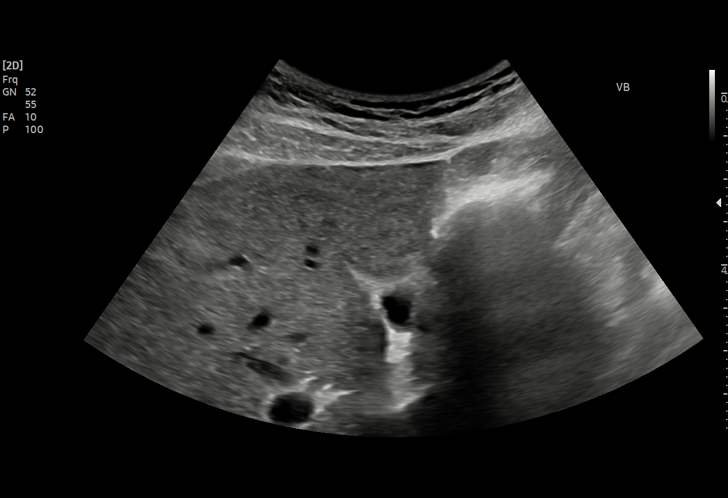
[im 21/50]
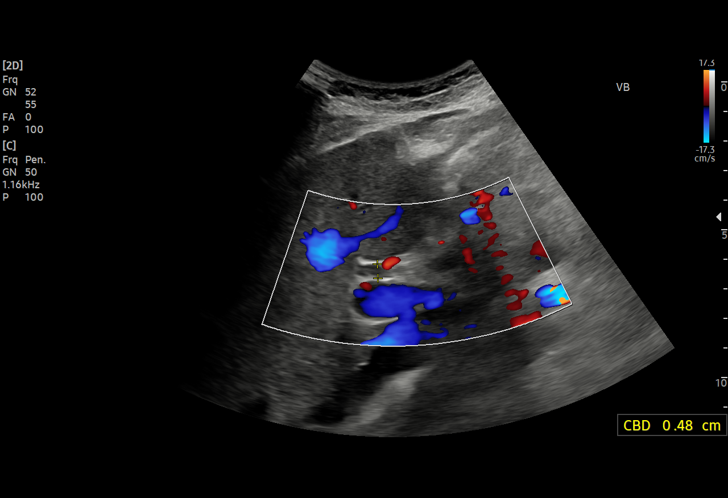
[im 25/50]
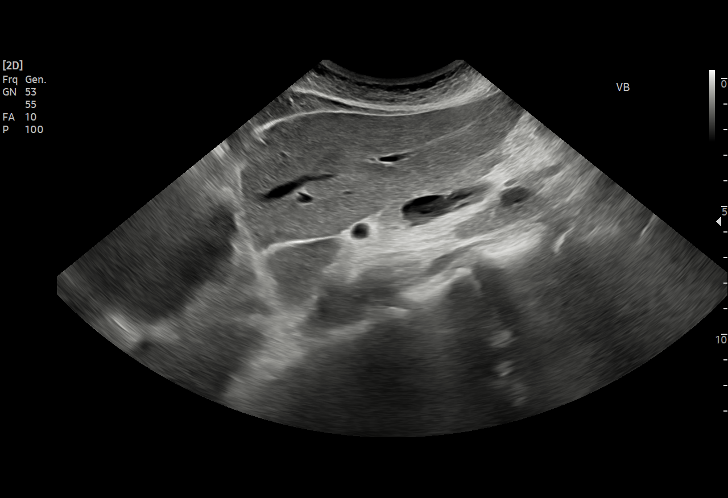
[im 29/50]
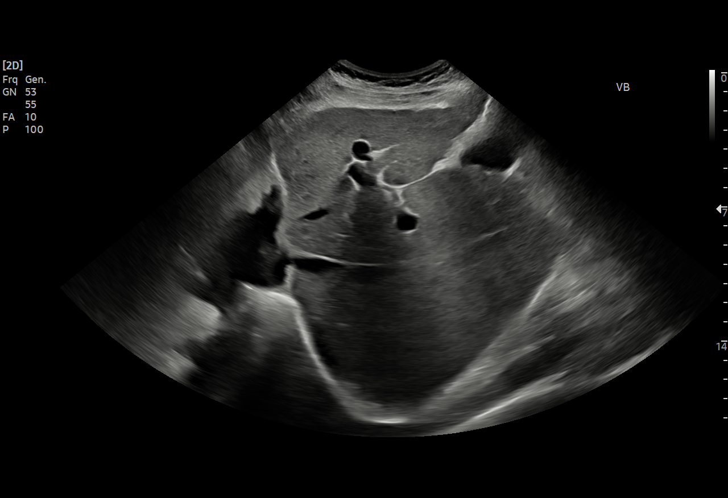
[im 31/50]
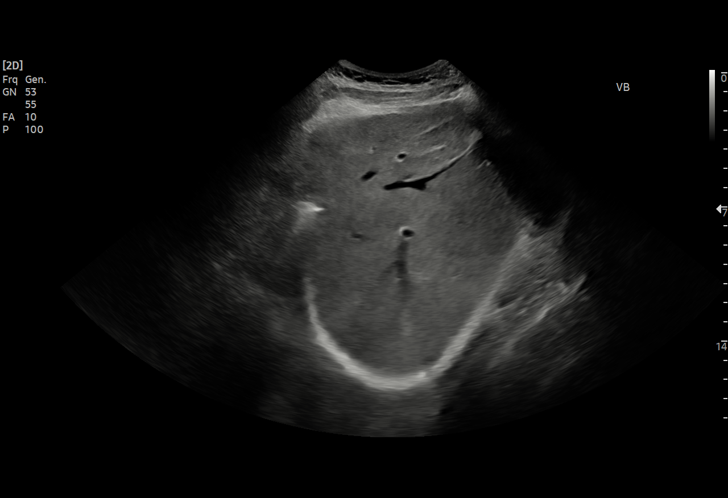
[im 35/50]
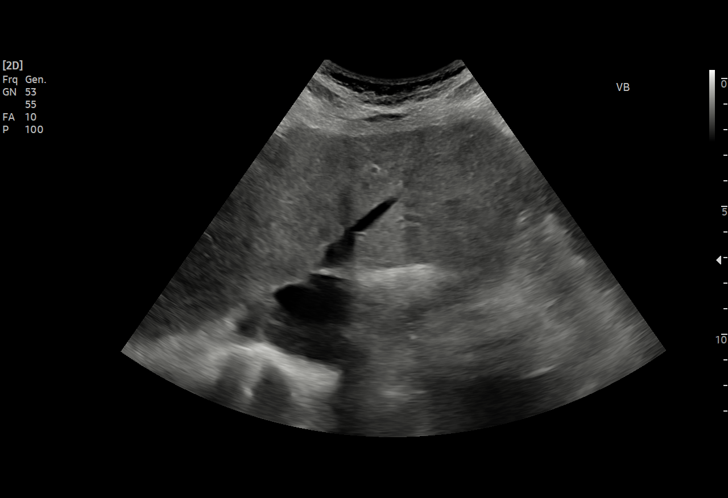
[im 39/50]
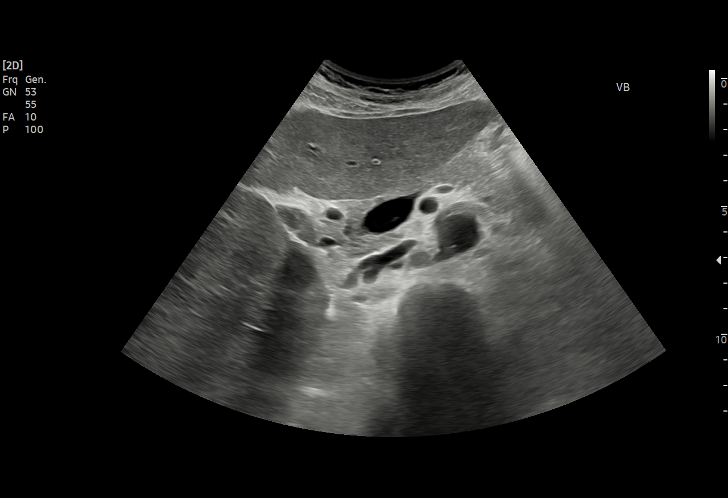
[im 41/50]
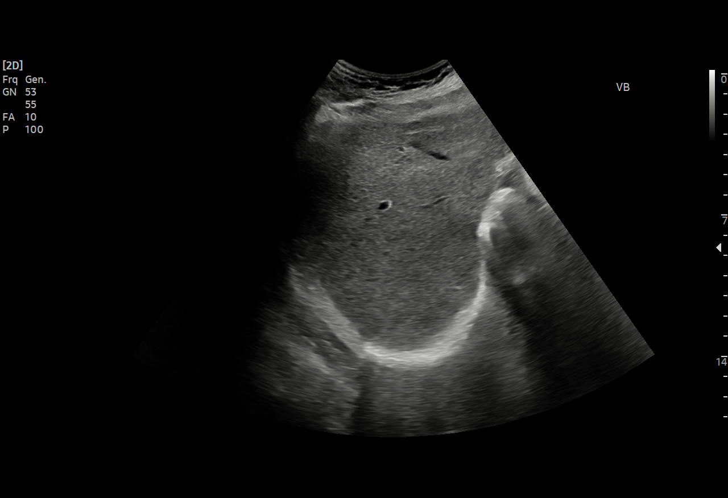
[im 45/50]
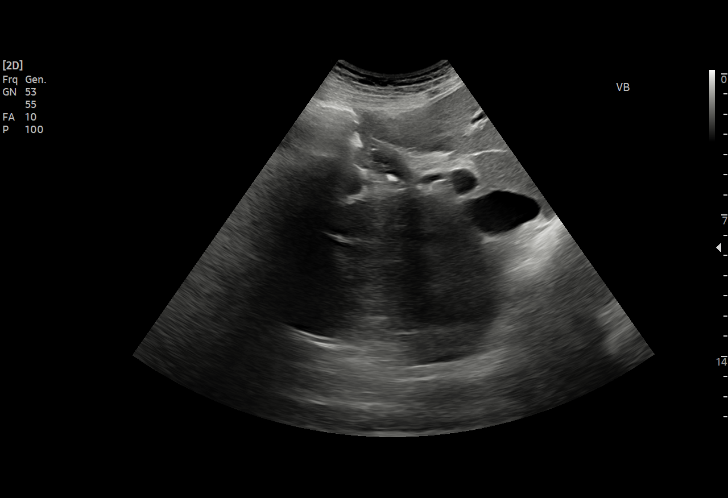
[im 50/50]
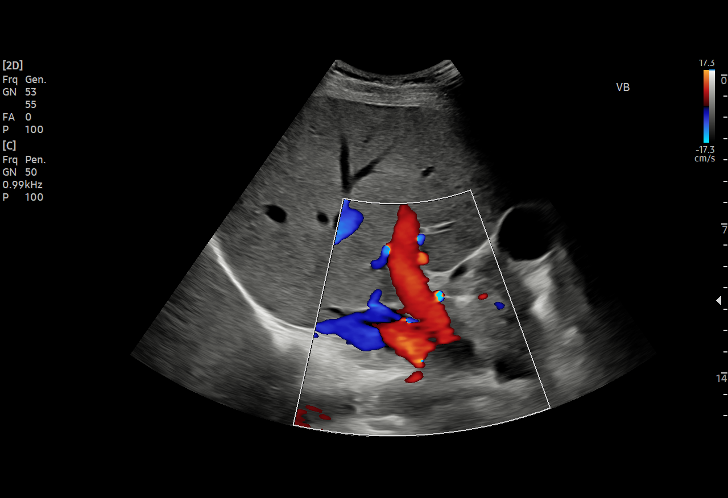

[15 of 25 positions shown; findings below may reference images not displayed]

FINDINGS: Gallbladder:

No gallstones or wall thickening visualized. No sonographic Murphy
sign noted by sonographer.

Common bile duct:

Diameter: 5 mm

Liver:

No focal lesion identified. Within normal limits in parenchymal
echogenicity. Portal vein is patent on color Doppler imaging with
normal direction of blood flow towards the liver.

Other: None.
IMPRESSION: Unremarkable right upper quadrant ultrasound.

## 2021-02-28 NOTE — Discharge Instructions (Addendum)
You were seen in the emergency department for evaluation of chest pain.  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  Fortunately, this diagnosis is not 1 that requires hospital admission at this time, but does require close follow-up by an oncologist next week.  I have spoke with the oncology team and they are expecting your call tomorrow morning.  From a chest pain standpoint, your laboratory evaluation is reassuringly normal, your chest x-ray is normal and your EKG is reassuring.  At this time, you are safe for discharge, but please call the oncology number listed below tomorrow morning.

## 2021-02-28 NOTE — ED Triage Notes (Signed)
Left side chest pain radiating to left arm, trouble sleeping x 1 week. Pt reports taking anti acid with relief.

## 2021-02-28 NOTE — ED Provider Notes (Signed)
Emergency Medicine Provider Triage Evaluation Note  [name] , a 55 y.o. female  was evaluated in triage.  Pt complains of chest pain that started last week. Pain has had intermittent pain.  Review of Systems  Positive: Chest pain Negative: Sob, diaphoresis, lightheadedness, nv  Physical Exam  BP 116/78 (BP Location: Right Arm)   Pulse 74   Temp 98.3 F (36.8 C) (Oral)   Resp 18   Ht '5\' 4"'$  (1.626 m)   Wt 62 kg   LMP 11/18/2012   SpO2 96%   BMI 23.46 kg/m  Gen:   Awake, no distress   Resp:  Normal effort  MSK:   Moves extremities without difficulty  Other:  Heart with rrr, lungs ctab  Medical Decision Making  Medically screening exam initiated at 12:40 PM.  Appropriate orders placed.  [name] was informed that the remainder of the evaluation will be completed by another provider, this initial triage assessment does not replace that evaluation, and the importance of remaining in the ED until their evaluation is complete.     Bishop Dublin 02/28/21 1241    Teressa Lower, MD 02/28/21 1800

## 2021-02-28 NOTE — ED Provider Notes (Signed)
[facility] DEPT Provider Note   CSN: [id number] Arrival date & time: 02/28/21  1207     History Chief Complaint  Patient presents with   Chest Pain    [name] is a 55 y.o. female with PMH endometriosis status post bowel resection who presents the emergency department for evaluation of chest pain.  Patient states that her chest pain started last week and has been intermittent.  She describes her pain as sharp and located under the left breast.  She denies associated nausea, vomiting, diaphoresis or exertional component to her chest pain.  While here in the emergency department, she is not currently complaining of chest pain and states that it has resolved.  She does endorse a 9 pound weight loss over the last 6 months, but denies night sweats, abnormal bruising or lymphadenopathy.   Chest Pain Associated symptoms: no abdominal pain, no back pain, no cough, no fever, no palpitations, no shortness of breath and no vomiting       History reviewed. No pertinent past medical history.  There are no problems to display for this patient.   Past Surgical History:  Procedure Laterality Date   ABDOMINAL HYSTERECTOMY     APPENDECTOMY     bowel removal     5 inches   BOWEL RESECTION     2 feet   BREAST BIOPSY Right 04/29/2019   APOCRINE METAPLASIA      OB History   No obstetric history on file.     Family History  Problem Relation Age of Onset   Breast cancer Neg Hx     Social History   Tobacco Use   Smoking status: Never   Smokeless tobacco: Never  Substance Use Topics   Alcohol use: No   Drug use: No    Home Medications Prior to Admission medications   Medication Sig Start Date End Date Taking? Authorizing Provider  acetaminophen (TYLENOL) 325 MG tablet Take 325 mg by mouth every 6 (six) hours as needed (pain).    Yes [provider]  cholestyramine Lucrezia Starch) 4 GM/DOSE powder Take 4 g by mouth daily.   Yes  [provider]  clonazePAM (KLONOPIN) 0.5 MG tablet Take 0.5 mg by mouth 2 (two) times daily as needed for anxiety.   Yes [provider]  escitalopram (LEXAPRO) 20 MG tablet Take 30 mg by mouth daily. 10/18/19  Yes [provider]  ibuprofen (ADVIL) 200 MG tablet Take 200 mg by mouth every 6 (six) hours as needed for moderate pain.    Yes [provider]  Lactobacillus (PROBIOTIC ACIDOPHILUS PO) Take 1 capsule by mouth daily.   Yes [provider]  loperamide (IMODIUM) 2 MG capsule Take 2 mg by mouth daily as needed for diarrhea or loose stools.   Yes [provider]  metaxalone (SKELAXIN) 400 MG tablet Take 1 tablet (400 mg total) by mouth 3 (three) times daily as needed for muscle spasms. 10/21/19  Yes Sherwood Gambler, MD  Multiple Vitamin (MULTIVITAMIN WITH MINERALS) TABS tablet Take 1 tablet by mouth daily.   Yes [provider]    Allergies    Other, Sulfa antibiotics, Wellbutrin [bupropion], and Piperacillin sod-tazobactam so  Review of Systems   Review of Systems  Constitutional:  Positive for unexpected weight change. Negative for chills and fever.  HENT:  Negative for ear pain and sore throat.   Eyes:  Negative for pain and visual disturbance.  Respiratory:  Negative for cough and shortness  of breath.   Cardiovascular:  Positive for chest pain. Negative for palpitations.  Gastrointestinal:  Negative for abdominal pain and vomiting.  Genitourinary:  Negative for dysuria and hematuria.  Musculoskeletal:  Negative for arthralgias and back pain.  Skin:  Negative for color change and rash.  Neurological:  Negative for seizures and syncope.  All other systems reviewed and are negative.  Physical Exam Updated Vital Signs BP 110/78   Pulse 60   Temp 98.4 F (36.9 C) (Oral)   Resp 19   Ht '5\' 4"'$  (1.626 m)   Wt 62 kg   LMP 11/18/2012   SpO2 98%   BMI 23.46 kg/m   Physical Exam Vitals and nursing note reviewed.   Constitutional:      General: She is not in acute distress.    Appearance: She is well-developed.  HENT:     Head: Normocephalic and atraumatic.  Eyes:     Conjunctiva/sclera: Conjunctivae normal.  Cardiovascular:     Rate and Rhythm: Normal rate and regular rhythm.     Heart sounds: No murmur heard. Pulmonary:     Effort: Pulmonary effort is normal. No respiratory distress.     Breath sounds: Normal breath sounds.  Abdominal:     Palpations: Abdomen is soft.     Tenderness: There is no abdominal tenderness.  Musculoskeletal:     Cervical back: Neck supple.  Skin:    General: Skin is warm and dry.  Neurological:     Mental Status: She is alert.    ED Results / Procedures / Treatments   Labs (all labs ordered are listed, but only abnormal results are displayed) Labs Reviewed  CBC - Abnormal; Notable for the following components:      Result Value   WBC 58.5 (*)    All other components within normal limits  COMPREHENSIVE METABOLIC PANEL - Abnormal; Notable for the following components:   Potassium 3.4 (*)    Total Protein 6.4 (*)    All other components within normal limits  CBC WITH DIFFERENTIAL/PLATELET - Abnormal; Notable for the following components:   WBC 57.6 (*)    Neutro Abs 36.2 (*)    Lymphs Abs 4.8 (*)    Monocytes Absolute 2.9 (*)    Eosinophils Absolute 1.0 (*)    Basophils Absolute 2.0 (*)    Abs Immature Granulocytes 10.68 (*)    All other components within normal limits  LIPASE, BLOOD  PATHOLOGIST SMEAR REVIEW  I-STAT BETA HCG BLOOD, ED ([facility], [facility], [facility] ONLY)  TROPONIN I (HIGH SENSITIVITY)  TROPONIN I (HIGH SENSITIVITY)    EKG EKG Interpretation  Date/Time:  Thursday February 28 2021 12:19:16 EDT Ventricular Rate:  76 PR Interval:  144 QRS Duration: 82 QT Interval:  390 QTC Calculation: 438 R Axis:   94 Text Interpretation: Normal sinus rhythm Right atrial enlargement Rightward axis Confirmed by Clarence Cogswell ([id number]) on 02/28/2021 5:57:11  PM  Radiology DG Chest 2 View  Result Date: 02/28/2021 CLINICAL DATA:  Chest pain EXAM: CHEST - 2 VIEW COMPARISON:  Chest x-ray dated October 27, 2019 FINDINGS: Cardiomediastinal contours within normal limits. Unchanged small bilateral calcified nodules, likely granulomatous. Mild left basilar atelectasis. Lungs are otherwise clear. Unchanged mild biapical pleuroparenchymal scarring. No acute pleural abnormalities. Interval plate and screw fixation of the left clavicle. IMPRESSION: No active cardiopulmonary disease. Electronically Signed   By: Yetta Glassman MD   On: 02/28/2021 13:03   US Abdomen Limited RUQ (LIVER/GB)  Result Date: 02/28/2021 CLINICAL DATA:  Right upper quadrant pain. EXAM: ULTRASOUND ABDOMEN LIMITED RIGHT UPPER QUADRANT COMPARISON:  CT October 21, 2019 FINDINGS: Gallbladder: No gallstones or wall thickening visualized. No sonographic Murphy sign noted by sonographer. Common bile duct: Diameter: 5 mm Liver: No focal lesion identified. Within normal limits in parenchymal echogenicity. Portal vein is patent on color Doppler imaging with normal direction of blood flow towards the liver. Other: None. IMPRESSION: Unremarkable right upper quadrant ultrasound. Electronically Signed   By: Dahlia Bailiff MD   On: 02/28/2021 16:36    Procedures Procedures   Medications Ordered in ED Medications - No data to display  ED Course  I have reviewed the triage vital signs and the nursing notes.  Pertinent labs & imaging results that were available during my care of the patient were reviewed by me and considered in my medical decision making (see chart for details).    MDM Rules/Calculators/A&P                           Patient seen in the emergency department for evaluation of chest pain.  Physical exam is unremarkable.  ECG with right axis deviation but no evidence of ST elevation or depression, no evidence of ischemia, normal rate, sinus rhythm.  Surprisingly, patient's initial CBC with a  significant leukocytosis to 58.5.  Repeat CBC with white blood cell count 57.6 with basophilia and eosinophilia concerning for underlying CML.  A blood smear was reviewed by pathologist who has similar concerns.  I spoke with the heme-onc on-call physician who recommended outpatient follow-up for BCR:Abl testing and likely initiation of outpatient therapy.  Patient has no active chest pain in the emergency department, has negative high-sensitivity troponin, nonischemic ECG and I have lower suspicion for ACS.  Patient is also not been tachycardic and not hypoxic here in the emergency department and I have low suspicion for PE at this time despite her likely cancer diagnosis.  Patient then discharged with outpatient oncology follow-up. Final Clinical Impression(s) / ED Diagnoses Final diagnoses:  RUQ pain  Chest pain, unspecified type  CML (chronic myelocytic leukemia) ([place])    Rx / DC Orders ED Discharge Orders     None        Hans Rusher, MD 02/28/21 1800

## 2021-03-01 ENCOUNTER — Inpatient Hospital Stay: Payer: BC Managed Care – PPO

## 2021-03-01 ENCOUNTER — Telehealth: Payer: Self-pay

## 2021-03-01 ENCOUNTER — Encounter: Payer: Self-pay | Admitting: Hematology and Oncology

## 2021-03-01 ENCOUNTER — Inpatient Hospital Stay: Payer: BC Managed Care – PPO | Attending: Hematology and Oncology | Admitting: Hematology and Oncology

## 2021-03-01 ENCOUNTER — Telehealth (HOSPITAL_COMMUNITY): Payer: Self-pay | Admitting: Student

## 2021-03-01 ENCOUNTER — Other Ambulatory Visit (HOSPITAL_COMMUNITY): Payer: Self-pay

## 2021-03-01 ENCOUNTER — Telehealth: Payer: Self-pay | Admitting: Hematology and Oncology

## 2021-03-01 DIAGNOSIS — F17210 Nicotine dependence, cigarettes, uncomplicated: Secondary | ICD-10-CM | POA: Diagnosis not present

## 2021-03-01 DIAGNOSIS — Z808 Family history of malignant neoplasm of other organs or systems: Secondary | ICD-10-CM | POA: Insufficient documentation

## 2021-03-01 DIAGNOSIS — C9210 Chronic myeloid leukemia, BCR/ABL-positive, not having achieved remission: Secondary | ICD-10-CM | POA: Diagnosis not present

## 2021-03-01 DIAGNOSIS — Z800 Family history of malignant neoplasm of digestive organs: Secondary | ICD-10-CM

## 2021-03-01 DIAGNOSIS — Z803 Family history of malignant neoplasm of breast: Secondary | ICD-10-CM | POA: Insufficient documentation

## 2021-03-01 DIAGNOSIS — G893 Neoplasm related pain (acute) (chronic): Secondary | ICD-10-CM | POA: Diagnosis not present

## 2021-03-01 LAB — CBC WITH DIFFERENTIAL/PLATELET
Abs Immature Granulocytes: 12.92 10*3/uL — ABNORMAL HIGH (ref 0.00–0.07)
Basophils Absolute: 2.2 10*3/uL — ABNORMAL HIGH (ref 0.0–0.1)
Basophils Relative: 3 %
Eosinophils Absolute: 1.2 10*3/uL — ABNORMAL HIGH (ref 0.0–0.5)
Eosinophils Relative: 2 %
HCT: 40.8 % (ref 36.0–46.0)
Hemoglobin: 14.1 g/dL (ref 12.0–15.0)
Immature Granulocytes: 19 %
Lymphocytes Relative: 7 %
Lymphs Abs: 4.7 10*3/uL — ABNORMAL HIGH (ref 0.7–4.0)
MCH: 33.3 pg (ref 26.0–34.0)
MCHC: 34.6 g/dL (ref 30.0–36.0)
MCV: 96.5 fL (ref 80.0–100.0)
Monocytes Absolute: 3.4 10*3/uL — ABNORMAL HIGH (ref 0.1–1.0)
Monocytes Relative: 5 %
Neutro Abs: 44.7 10*3/uL — ABNORMAL HIGH (ref 1.7–7.7)
Neutrophils Relative %: 65 %
Platelets: 309 10*3/uL (ref 150–400)
RBC: 4.23 MIL/uL (ref 3.87–5.11)
RDW: 13.4 % (ref 11.5–15.5)
WBC: 69 10*3/uL (ref 4.0–10.5)
nRBC: 0 % (ref 0.0–0.2)

## 2021-03-01 LAB — LACTATE DEHYDROGENASE: LDH: 516 U/L — ABNORMAL HIGH (ref 98–192)

## 2021-03-01 LAB — URIC ACID: Uric Acid, Serum: 6.1 mg/dL (ref 2.5–7.1)

## 2021-03-01 LAB — ABO/RH: ABO/RH(D): B POS

## 2021-03-01 MED ORDER — HYDROXYUREA 500 MG PO CAPS
1000.0000 mg | ORAL_CAPSULE | Freq: Two times a day (BID) | ORAL | 0 refills | Status: DC
  Filled 2021-03-01: qty 30, 8d supply, fill #0

## 2021-03-01 NOTE — Assessment & Plan Note (Signed)
Her chest wall discomfort is likely due to splenomegaly and referred pain She can take acetaminophen as needed

## 2021-03-01 NOTE — Assessment & Plan Note (Signed)
We discussed the importance of nicotine cessation  

## 2021-03-01 NOTE — Telephone Encounter (Signed)
Received a call from [name] to establish care with an oncologist for leukemia. [name] has been scheduled to see Dr. Alvy Bimler today at 1pm. Pt aware to arrive 30 minutes early.

## 2021-03-01 NOTE — Telephone Encounter (Signed)
Note created to place ambulatory referral order

## 2021-03-01 NOTE — Assessment & Plan Note (Signed)
Based on review of her ER visit, I suspect she might have CML She has mild palpable splenomegaly I will order blood work, review her blood smear and order additional test to confirm the diagnosis I will see her again on Monday for further follow-up We discussed the importance of starting her on hydroxyurea to control her white blood cell count I recommend nicotine cessation I will start her on aspirin to prevent risk of clots She is educated to drink more fluids The risks, benefits, side effects of Hydrea is discussed and she is in agreement to proceed

## 2021-03-01 NOTE — Progress Notes (Unsigned)
CRITICAL VALUE STICKER  CRITICAL VALUE: WBC 69.  RECEIVER (on-site recipient of call):Enrica Corliss Wynetta Emery, RN  DATE & TIME NOTIFIED: 03/01/21 AT 1416  MESSENGER (representative from lab):Loletta Parish  MD NOTIFIED: Dr. Jacklynn Lewis  TIME OF NOTIFICATION: [id number] on 03/01/21  RESPONSE: Seen in the office.

## 2021-03-01 NOTE — Progress Notes (Signed)
[place] CONSULT NOTE  Patient Care Team: Natalie Seashore, MD as PCP - General (Internal Medicine)  CHIEF COMPLAINTS/PURPOSE OF CONSULTATION:  Leukocytosis HISTORY OF PRESENTING ILLNESS:  [name] 55 y.o. female is here because of elevated WBC.  She was found to have abnormal CBC from routine blood work I have the opportunity to review his CBC from previous years Last April, her CBC was normal She has been complaining of intermittent chest pain over the past week The location of her pain is left lower chest radiating to the left shoulder She rated her pain at 0.5 right now She did not take over-the-counter analgesics  She denies recent infection. The last prescription antibiotics was more than 3 months ago There is not reported symptoms of sinus congestion, cough, urinary frequency/urgency or dysuria, or abnormal skin rash.  She has intermittent shoulder pain which I believe is a referred pain She has intermittent diarrhea that is chronic in nature She had no prior history or diagnosis of cancer. Her age appropriate screening programs are up-to-date. She has strong family history of breast cancer.  Her mother had treatment related AML The patient has no prior diagnosis of autoimmune disease and was not prescribed corticosteroids related products. The patient is a smoker and currently smokes 1 pack of cigarettes per day for the last 17 years.  MEDICAL HISTORY:  History reviewed. No pertinent past medical history.  SURGICAL HISTORY: Past Surgical History:  Procedure Laterality Date   ABDOMINAL HYSTERECTOMY     APPENDECTOMY     bowel removal     5 inches   BOWEL RESECTION     2 feet   BREAST BIOPSY Right 04/29/2019   APOCRINE METAPLASIA     SOCIAL HISTORY: Social History   Socioeconomic History   Marital status: Single    Spouse name: Not on file   Number of children: 0   Years of education: Not on file   Highest education level: Not on  file  Occupational History   Occupation: retired  Tobacco Use   Smoking status: Every Day    Packs/day: 1.00    Years: 17.00    Pack years: 17.00    Types: Cigarettes   Smokeless tobacco: Never  Substance and Sexual Activity   Alcohol use: No   Drug use: No   Sexual activity: Yes  Other Topics Concern   Not on file  Social History Narrative   Not on file   Social Determinants of Health   Financial Resource Strain: Not on file  Food Insecurity: Not on file  Transportation Needs: Not on file  Physical Activity: Not on file  Stress: Not on file  Social Connections: Not on file  Intimate Partner Violence: Not on file    FAMILY HISTORY: Family History  Problem Relation Age of Onset   Cancer Mother        breast ca x 2, then AML   Cancer Maternal Aunt        breast ca   Cancer Paternal Grandmother        colon cancer   Cancer Maternal Aunt        breast ca   Breast cancer Neg Hx     ALLERGIES:  is allergic to other, sulfa antibiotics, wellbutrin [bupropion], and piperacillin sod-tazobactam so.  MEDICATIONS:  Current Outpatient Medications  Medication Sig Dispense Refill   hydroxyurea (HYDREA) 500 MG capsule Take 2 capsules by mouth 2  times daily. May take with food to  minimize GI side effects. 30 capsule 0   acetaminophen (TYLENOL) 325 MG tablet Take 325 mg by mouth every 6 (six) hours as needed (pain).      cholestyramine (QUESTRAN) 4 GM/DOSE powder Take 4 g by mouth daily.     clonazePAM (KLONOPIN) 0.5 MG tablet Take 0.5 mg by mouth 2 (two) times daily as needed for anxiety.     escitalopram (LEXAPRO) 20 MG tablet Take 30 mg by mouth daily.     ibuprofen (ADVIL) 200 MG tablet Take 200 mg by mouth every 6 (six) hours as needed for moderate pain.      Lactobacillus (PROBIOTIC ACIDOPHILUS PO) Take 1 capsule by mouth daily.     loperamide (IMODIUM) 2 MG capsule Take 2 mg by mouth daily as needed for diarrhea or loose stools.     metaxalone (SKELAXIN) 400 MG tablet  Take 1 tablet (400 mg total) by mouth 3 (three) times daily as needed for muscle spasms. 15 tablet 0   Multiple Vitamin (MULTIVITAMIN WITH MINERALS) TABS tablet Take 1 tablet by mouth daily.     No current facility-administered medications for this visit.    REVIEW OF SYSTEMS:   Constitutional: Denies fevers, chills or abnormal night sweats Eyes: Denies blurriness of vision, double vision or watery eyes Ears, nose, mouth, throat, and face: Denies mucositis or sore throat Respiratory: Denies cough, dyspnea or wheezes Cardiovascular: Denies palpitation, chest discomfort or lower extremity swelling Gastrointestinal:  Denies nausea, heartburn or change in bowel habits Skin: Denies abnormal skin rashes Lymphatics: Denies new lymphadenopathy or easy bruising Neurological:Denies numbness, tingling or new weaknesses Behavioral/Psych: Mood is stable, no new changes  All other systems were reviewed with the patient and are negative.  PHYSICAL EXAMINATION: ECOG PERFORMANCE STATUS: 1 - Symptomatic but completely ambulatory  Vitals:   03/01/21 1246  BP: 118/71  Pulse: 75  Resp: 18  Temp: 98.8 F (37.1 C)  SpO2: 98%   Filed Weights   03/01/21 1246  Weight: 134 lb 9.6 oz (61.1 kg)    GENERAL:alert, no distress and comfortable SKIN: skin color, texture, turgor are normal, no rashes or significant lesions EYES: normal, conjunctiva are pink and non-injected, sclera clear OROPHARYNX:no exudate, no erythema and lips, buccal mucosa, and tongue normal  NECK: supple, thyroid normal size, non-tender, without nodularity LYMPH:  no palpable lymphadenopathy in the cervical, axillary or inguinal LUNGS: clear to auscultation and percussion with normal breathing effort HEART: regular rate & rhythm and no murmurs and no lower extremity edema ABDOMEN:abdomen soft, non-tender and normal bowel sounds.  Palpable splenomegaly Musculoskeletal:no cyanosis of digits and no clubbing  PSYCH: alert & oriented x  3 with fluent speech NEURO: no focal motor/sensory deficits  LABORATORY DATA:  I have reviewed the data as listed Recent Results (from the past 2160 hour(s))  CBC     Status: Abnormal   Collection Time: 02/28/21 12:52 PM  Result Value Ref Range   WBC 58.5 (HH) 4.0 - 10.5 K/uL    Comment: WHITE COUNT CONFIRMED ON SMEAR THIS CRITICAL RESULT HAS VERIFIED AND BEEN CALLED TO [facility] BY KENNEDY JACKSON ON 08 11 2022 AT 1359, AND HAS BEEN READ BACK. CRITICAL RESULT VERIFIED    RBC 4.06 3.87 - 5.11 MIL/uL   Hemoglobin 13.6 12.0 - 15.0 g/dL   HCT 40.3 36.0 - 46.0 %   MCV 99.3 80.0 - 100.0 fL   MCH 33.5 26.0 - 34.0 pg   MCHC 33.7 30.0 - 36.0 g/dL   RDW 13.7 11.5 -  15.5 %   Platelets 311 150 - 400 K/uL   nRBC 0.0 0.0 - 0.2 %    Comment: Performed at [facility], [place] [address], [place], [place] [address]  Troponin I (High Sensitivity)     Status: None   Collection Time: 02/28/21 12:52 PM  Result Value Ref Range   Troponin I (High Sensitivity) 4 <18 ng/L    Comment: (NOTE) Elevated high sensitivity troponin I (hsTnI) values and significant  changes across serial measurements may suggest ACS but many other  chronic and acute conditions are known to elevate hsTnI results.  Refer to the "Links" section for chest pain algorithms and additional  guidance. Performed at [facility], [place] [address] [place], [place] [address]   Comprehensive metabolic panel     Status: Abnormal   Collection Time: 02/28/21 12:52 PM  Result Value Ref Range   Sodium 142 135 - 145 mmol/L   Potassium 3.4 (L) 3.5 - 5.1 mmol/L   Chloride 110 98 - 111 mmol/L   CO2 23 22 - 32 mmol/L   Glucose, Bld 99 70 - 99 mg/dL    Comment: Glucose reference range applies only to samples taken after fasting for at least 8 hours.   BUN 17 6 - 20 mg/dL   Creatinine, Ser 0.85 0.44 - 1.00 mg/dL   Calcium 9.1 8.9 - 10.3 mg/dL   Total Protein 6.4 (L) 6.5 - 8.1 g/dL   Albumin 3.9 3.5 - 5.0 g/dL    AST 24 15 - 41 U/L   ALT 19 0 - 44 U/L   Alkaline Phosphatase 119 38 - 126 U/L   Total Bilirubin 0.5 0.3 - 1.2 mg/dL   GFR, Estimated >60 >60 mL/min    Comment: (NOTE) Calculated using the CKD-EPI Creatinine Equation (2021)    Anion gap 9 5 - 15    Comment: Performed at [facility], [place] [address], [place], [place] [address]  Lipase, blood     Status: None   Collection Time: 02/28/21 12:52 PM  Result Value Ref Range   Lipase 35 11 - 51 U/L    Comment: Performed at [facility], [place] [address] [place], [place] [address]  I-Stat beta hCG blood, ED     Status: None   Collection Time: 02/28/21  1:00 PM  Result Value Ref Range   I-stat hCG, quantitative <5.0 <5 mIU/mL   Comment 3            Comment:   GEST. AGE      CONC.  (mIU/mL)   <=1 WEEK        5 - 50     2 WEEKS       50 - 500     3 WEEKS       100 - 10,000     4 WEEKS     1,000 - 30,000        FEMALE AND NON-PREGNANT FEMALE:     LESS THAN 5 mIU/mL   Troponin I (High Sensitivity)     Status: None   Collection Time: 02/28/21  2:23 PM  Result Value Ref Range   Troponin I (High Sensitivity) 4 <18 ng/L    Comment: (NOTE) Elevated high sensitivity troponin I (hsTnI) values and significant  changes across serial measurements may suggest ACS but many other  chronic and acute conditions are known to elevate hsTnI results.  Refer to the "Links" section for chest pain algorithms  and additional  guidance. Performed at [facility], [place] [address] [place], [place] [address]   CBC with Differential     Status: Abnormal   Collection Time: 02/28/21  2:29 PM  Result Value Ref Range   WBC 57.6 (HH) 4.0 - 10.5 K/uL    Comment: CRITICAL VALUE NOTED.  VALUE IS CONSISTENT WITH PREVIOUSLY REPORTED AND CALLED VALUE. WHITE COUNT CONFIRMED ON SMEAR    RBC 3.99 3.87 - 5.11 MIL/uL   Hemoglobin 13.1 12.0 - 15.0 g/dL   HCT 39.3 36.0 - 46.0 %   MCV 98.5 80.0 - 100.0 fL   MCH 32.8  26.0 - 34.0 pg   MCHC 33.3 30.0 - 36.0 g/dL   RDW 13.6 11.5 - 15.5 %   Platelets 299 150 - 400 K/uL   nRBC 0.1 0.0 - 0.2 %   Neutrophils Relative % 62 %   Neutro Abs 36.2 (H) 1.7 - 7.7 K/uL   Lymphocytes Relative 8 %   Lymphs Abs 4.8 (H) 0.7 - 4.0 K/uL   Monocytes Relative 5 %   Monocytes Absolute 2.9 (H) 0.1 - 1.0 K/uL   Eosinophils Relative 2 %   Eosinophils Absolute 1.0 (H) 0.0 - 0.5 K/uL   Basophils Relative 4 %   Basophils Absolute 2.0 (H) 0.0 - 0.1 K/uL   WBC Morphology      MODERATE LEFT SHIFT (>5% METAS AND MYELOS,OCC PRO NOTED)   Immature Granulocytes 19 %   Abs Immature Granulocytes 10.68 (H) 0.00 - 0.07 K/uL   Reactive, Benign Lymphocytes PRESENT     Comment: Performed at [facility], [place] [address], [place], [place] [address]   I have reviewed her peripheral blood smear Leukocytosis is noted Some mild leukoerythroblastic picture but I did not see increased blasts.  Normal red blood cell and platelet  RADIOGRAPHIC STUDIES: I have personally reviewed the radiological images as listed and agreed with the findings in the report. DG Chest 2 View  Result Date: 02/28/2021 CLINICAL DATA:  Chest pain EXAM: CHEST - 2 VIEW COMPARISON:  Chest x-ray dated October 27, 2019 FINDINGS: Cardiomediastinal contours within normal limits. Unchanged small bilateral calcified nodules, likely granulomatous. Mild left basilar atelectasis. Lungs are otherwise clear. Unchanged mild biapical pleuroparenchymal scarring. No acute pleural abnormalities. Interval plate and screw fixation of the left clavicle. IMPRESSION: No active cardiopulmonary disease. Electronically Signed   By: Yetta Glassman MD   On: 02/28/2021 13:03   US Abdomen Limited RUQ (LIVER/GB)  Result Date: 02/28/2021 CLINICAL DATA:  Right upper quadrant pain. EXAM: ULTRASOUND ABDOMEN LIMITED RIGHT UPPER QUADRANT COMPARISON:  CT October 21, 2019 FINDINGS: Gallbladder: No gallstones or wall thickening visualized. No  sonographic Murphy sign noted by sonographer. Common bile duct: Diameter: 5 mm Liver: No focal lesion identified. Within normal limits in parenchymal echogenicity. Portal vein is patent on color Doppler imaging with normal direction of blood flow towards the liver. Other: None. IMPRESSION: Unremarkable right upper quadrant ultrasound. 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 CML She has mild palpable splenomegaly I will order blood work, review her blood smear and order additional test to confirm the diagnosis I will see her again on Monday for further follow-up We discussed the importance of starting her on hydroxyurea to control her white blood cell count I recommend nicotine cessation  I will start her on aspirin to prevent risk of clots She is educated to drink more fluids The risks, benefits, side effects of Hydrea is discussed and she is in agreement to proceed  Cancer associated pain Her chest wall discomfort is likely due to splenomegaly and referred pain She can take acetaminophen as needed  Continuous dependence on cigarette smoking We discussed the importance of nicotine cessation  Family history of breast cancer She has strong family history of breast cancer and colon cancer Her mother had treatment related acute myelogenous leukemia I will refer her to genetic counselor  Orders Placed This Encounter  Procedures   CBC with Differential/Platelet    Standing Status:   Future    Number of Occurrences:   1    Standing Expiration Date:   03/01/2022   BCR ABL1 FISH ([facility])    Standing Status:   Future    Number of Occurrences:   1    Standing Expiration Date:   03/01/2022   bcr/abl-PCR    Standing Status:   Future    Number of Occurrences:   1    Standing Expiration Date:   03/01/2022   Lactate dehydrogenase (LDH)    Standing  Status:   Future    Number of Occurrences:   1    Standing Expiration Date:   03/01/2022   Uric acid    Standing Status:   Future    Number of Occurrences:   1    Standing Expiration Date:   03/01/2022   Save Smear ([facility])    Standing Status:   Future    Number of Occurrences:   1    Standing Expiration Date:   03/01/2022   Ambulatory referral to Genetics    Referral Priority:   Routine    Referral Type:   Consultation    Referral Reason:   Specialty Services Required    Number of Visits Requested:   1   ABO/RH    Standing Status:   Future    Number of Occurrences:   1    Standing Expiration Date:   03/01/2022    All questions were answered. The patient knows to call the clinic with any problems, questions or concerns. I spent 60 minutes counseling the patient face to face. The total time spent in the appointment was 60 minutes and more than 50% was on counseling.     [name], MD 03/01/2021 1:39 PM

## 2021-03-01 NOTE — Telephone Encounter (Signed)
Called and told per Dr. Alvy Bimler, she looked at the smear in lab and it does not look like acute leukemia. I looks like CML, no change in treatment plan. [name] verbalized understanding.

## 2021-03-01 NOTE — Assessment & Plan Note (Signed)
She has strong family history of breast cancer and colon cancer Her mother had treatment related acute myelogenous leukemia I will refer her to genetic counselor

## 2021-03-04 ENCOUNTER — Other Ambulatory Visit: Payer: Self-pay | Admitting: Hematology and Oncology

## 2021-03-04 ENCOUNTER — Telehealth: Payer: Self-pay | Admitting: Hematology and Oncology

## 2021-03-04 ENCOUNTER — Inpatient Hospital Stay: Payer: BC Managed Care – PPO

## 2021-03-04 ENCOUNTER — Encounter: Payer: Self-pay | Admitting: Hematology and Oncology

## 2021-03-04 ENCOUNTER — Other Ambulatory Visit: Payer: Self-pay

## 2021-03-04 ENCOUNTER — Inpatient Hospital Stay: Payer: BC Managed Care – PPO | Admitting: Hematology and Oncology

## 2021-03-04 DIAGNOSIS — F17210 Nicotine dependence, cigarettes, uncomplicated: Secondary | ICD-10-CM

## 2021-03-04 DIAGNOSIS — C9210 Chronic myeloid leukemia, BCR/ABL-positive, not having achieved remission: Secondary | ICD-10-CM

## 2021-03-04 DIAGNOSIS — G893 Neoplasm related pain (acute) (chronic): Secondary | ICD-10-CM | POA: Diagnosis not present

## 2021-03-04 LAB — CBC WITH DIFFERENTIAL/PLATELET
Abs Immature Granulocytes: 4.21 10*3/uL — ABNORMAL HIGH (ref 0.00–0.07)
Basophils Absolute: 1.3 10*3/uL — ABNORMAL HIGH (ref 0.0–0.1)
Basophils Relative: 3 %
Eosinophils Absolute: 0.9 10*3/uL — ABNORMAL HIGH (ref 0.0–0.5)
Eosinophils Relative: 2 %
HCT: 38.5 % (ref 36.0–46.0)
Hemoglobin: 13.5 g/dL (ref 12.0–15.0)
Immature Granulocytes: 9 %
Lymphocytes Relative: 8 %
Lymphs Abs: 3.7 10*3/uL (ref 0.7–4.0)
MCH: 33.5 pg (ref 26.0–34.0)
MCHC: 35.1 g/dL (ref 30.0–36.0)
MCV: 95.5 fL (ref 80.0–100.0)
Monocytes Absolute: 1.8 10*3/uL — ABNORMAL HIGH (ref 0.1–1.0)
Monocytes Relative: 4 %
Neutro Abs: 35.8 10*3/uL — ABNORMAL HIGH (ref 1.7–7.7)
Neutrophils Relative %: 74 %
Platelets: 303 10*3/uL (ref 150–400)
RBC: 4.03 MIL/uL (ref 3.87–5.11)
RDW: 13.3 % (ref 11.5–15.5)
WBC: 47.7 10*3/uL — ABNORMAL HIGH (ref 4.0–10.5)
nRBC: 0 % (ref 0.0–0.2)

## 2021-03-04 LAB — SURGICAL PATHOLOGY

## 2021-03-04 MED ORDER — HYDROXYUREA 500 MG PO CAPS
1000.0000 mg | ORAL_CAPSULE | Freq: Every day | ORAL | 0 refills | Status: DC
Start: 2021-03-04 — End: 2021-03-11

## 2021-03-04 NOTE — Telephone Encounter (Signed)
Scheduled appts per 8/12 sch msg. Called pt, no answer. Left msg with appts date and time.

## 2021-03-04 NOTE — Assessment & Plan Note (Signed)
She is working on smoking cessation I continue to encourage her to quit smoking

## 2021-03-04 NOTE — Progress Notes (Signed)
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[place]) I have reviewed her recent peripheral smear Overall, I do believe she has CML and not AML BCR/ABL by molecular testing is pending She has good response to hydroxyurea so far I recommend reducing hydroxyurea to 1000 mg daily for the next 7 days and we will recheck her CBC next week I am hopeful I will have final results back by the end of the week If result is back confirming CML, I plan to switch her to Dasatinib  Cancer associated pain She has intermittent shoulder pain I suspect this is referred pain She can take acetaminophen and ibuprofen as needed  Continuous dependence on cigarette smoking She is working on smoking cessation I continue to encourage her to quit smoking  No orders of the defined types were placed in this encounter.   All questions were answered. The patient knows to call the clinic with any problems, questions or concerns. The total time spent in the appointment was 20 minutes encounter with patients including review of chart and various tests results, discussions about plan of care and coordination of care plan   [name], MD 03/04/2021 1:30 PM  INTERVAL HISTORY: Please see below for problem oriented charting. She returns for further follow-up She tolerated hydroxyurea well She is attempting to quit smoking She have numerous questions about dietary of physical restriction No recent fever or chills  SUMMARY OF ONCOLOGIC HISTORY:  She was found to have abnormal CBC from routine blood work I have the opportunity to review his CBC from previous years Last April, her CBC was normal She has been complaining of intermittent chest pain over the past week The location of her pain is left lower chest radiating to the left shoulder She rated her pain at 0.5 right now She  did not take over-the-counter analgesics  She denies recent infection. The last prescription antibiotics was more than 3 months ago There is not reported symptoms of sinus congestion, cough, urinary frequency/urgency or dysuria, or abnormal skin rash.  She has intermittent shoulder pain which I believe is a referred pain She has intermittent diarrhea that is chronic in nature She had no prior history or diagnosis of cancer. Her age appropriate screening programs are up-to-date. She has strong family history of breast cancer.  Her mother had treatment related AML The patient has no prior diagnosis of autoimmune disease and was not prescribed corticosteroids related products. The patient is a smoker and currently smokes 1 pack of cigarettes per day for the last 17 years. She was started on hydroxyurea on 03/01/2021 for presumed diagnosis of CML  REVIEW OF SYSTEMS:   Constitutional: Denies fevers, chills or abnormal weight loss Eyes: Denies blurriness of vision Ears, nose, mouth, throat, and face: Denies mucositis or sore throat Respiratory: Denies cough, dyspnea or wheezes Cardiovascular: Denies palpitation, chest discomfort or lower extremity swelling Gastrointestinal:  Denies nausea, heartburn or change in bowel habits Skin: Denies abnormal skin rashes Lymphatics: Denies new lymphadenopathy or easy bruising Neurological:Denies numbness, tingling or new weaknesses Behavioral/Psych: Mood is stable, no new changes  All other systems were reviewed with the patient and are negative.  I have reviewed the past medical history, past surgical history, social history and family history with the patient and they are unchanged from previous note.  ALLERGIES:  is allergic to other, sulfa antibiotics, wellbutrin [bupropion], and piperacillin  sod-tazobactam so.  MEDICATIONS:  Current Outpatient Medications  Medication Sig Dispense Refill   acetaminophen (TYLENOL) 325 MG tablet Take 325 mg by mouth  every 6 (six) hours as needed (pain).      cholestyramine (QUESTRAN) 4 GM/DOSE powder Take 4 g by mouth daily.     clonazePAM (KLONOPIN) 0.5 MG tablet Take 0.5 mg by mouth 2 (two) times daily as needed for anxiety.     escitalopram (LEXAPRO) 20 MG tablet Take 30 mg by mouth daily.     hydroxyurea (HYDREA) 500 MG capsule Take 2 capsules (1,000 mg total) by mouth daily. 30 capsule 0   ibuprofen (ADVIL) 200 MG tablet Take 200 mg by mouth every 6 (six) hours as needed for moderate pain.      Lactobacillus (PROBIOTIC ACIDOPHILUS PO) Take 1 capsule by mouth daily.     loperamide (IMODIUM) 2 MG capsule Take 2 mg by mouth daily as needed for diarrhea or loose stools.     metaxalone (SKELAXIN) 400 MG tablet Take 1 tablet (400 mg total) by mouth 3 (three) times daily as needed for muscle spasms. 15 tablet 0   Multiple Vitamin (MULTIVITAMIN WITH MINERALS) TABS tablet Take 1 tablet by mouth daily.     No current facility-administered medications for this visit.    PHYSICAL EXAMINATION: ECOG PERFORMANCE STATUS: 1 - Symptomatic but completely ambulatory  Vitals:   03/04/21 1249  BP: 114/64  Pulse: 74  Resp: 18  Temp: 98.3 F (36.8 C)  SpO2: 98%   Filed Weights   03/04/21 1249  Weight: 132 lb 3.2 oz (60 kg)    GENERAL:alert, no distress and comfortable NEURO: alert & oriented x 3 with fluent speech, no focal motor/sensory deficits  LABORATORY DATA:  I have reviewed the data as listed    Component Value Date/Time   NA 142 02/28/2021 1252   K 3.4 (L) 02/28/2021 1252   CL 110 02/28/2021 1252   CO2 23 02/28/2021 1252   GLUCOSE 99 02/28/2021 1252   BUN 17 02/28/2021 1252   CREATININE 0.85 02/28/2021 1252   CALCIUM 9.1 02/28/2021 1252   PROT 6.4 (L) 02/28/2021 1252   ALBUMIN 3.9 02/28/2021 1252   AST 24 02/28/2021 1252   ALT 19 02/28/2021 1252   ALKPHOS 119 02/28/2021 1252   BILITOT 0.5 02/28/2021 1252   GFRNONAA >60 02/28/2021 1252   GFRAA >60 10/21/2019 1020    No results found  for: SPEP, UPEP  Lab Results  Component Value Date   WBC 47.7 (H) 03/04/2021   NEUTROABS 35.8 (H) 03/04/2021   HGB 13.5 03/04/2021   HCT 38.5 03/04/2021   MCV 95.5 03/04/2021   PLT 303 03/04/2021      Chemistry      Component Value Date/Time   NA 142 02/28/2021 1252   K 3.4 (L) 02/28/2021 1252   CL 110 02/28/2021 1252   CO2 23 02/28/2021 1252   BUN 17 02/28/2021 1252   CREATININE 0.85 02/28/2021 1252      Component Value Date/Time   CALCIUM 9.1 02/28/2021 1252   ALKPHOS 119 02/28/2021 1252   AST 24 02/28/2021 1252   ALT 19 02/28/2021 1252   BILITOT 0.5 02/28/2021 1252       RADIOGRAPHIC STUDIES: I have personally reviewed the radiological images as listed and agreed with the findings in the report. DG Chest 2 View  Result Date: 02/28/2021 CLINICAL DATA:  Chest pain EXAM: CHEST - 2 VIEW COMPARISON:  Chest x-ray dated October 27, 2019 FINDINGS:  Cardiomediastinal contours within normal limits. Unchanged small bilateral calcified nodules, likely granulomatous. Mild left basilar atelectasis. Lungs are otherwise clear. Unchanged mild biapical pleuroparenchymal scarring. No acute pleural abnormalities. Interval plate and screw fixation of the left clavicle. IMPRESSION: No active cardiopulmonary disease. Electronically Signed   By: Yetta Glassman MD   On: 02/28/2021 13:03   US Abdomen Limited RUQ (LIVER/GB)  Result Date: 02/28/2021 CLINICAL DATA:  Right upper quadrant pain. EXAM: ULTRASOUND ABDOMEN LIMITED RIGHT UPPER QUADRANT COMPARISON:  CT October 21, 2019 FINDINGS: Gallbladder: No gallstones or wall thickening visualized. No sonographic Murphy sign noted by sonographer. Common bile duct: Diameter: 5 mm Liver: No focal lesion identified. Within normal limits in parenchymal echogenicity. Portal vein is patent on color Doppler imaging with normal direction of blood flow towards the liver. Other: None. IMPRESSION: Unremarkable right upper quadrant ultrasound. Electronically Signed   By:  Dahlia Bailiff MD   On: 02/28/2021 16:36

## 2021-03-04 NOTE — Assessment & Plan Note (Addendum)
I have reviewed her recent peripheral smear Overall, I do believe she has CML and not AML BCR/ABL by molecular testing is pending She has good response to hydroxyurea so far I recommend reducing hydroxyurea to 1000 mg daily for the next 7 days and we will recheck her CBC next week I am hopeful I will have final results back by the end of the week If result is back confirming CML, I plan to switch her to Dasatinib

## 2021-03-04 NOTE — Assessment & Plan Note (Signed)
She has intermittent shoulder pain I suspect this is referred pain She can take acetaminophen and ibuprofen as needed

## 2021-03-05 LAB — FLOW CYTOMETRY

## 2021-03-08 ENCOUNTER — Other Ambulatory Visit: Payer: Self-pay | Admitting: Hematology and Oncology

## 2021-03-08 ENCOUNTER — Other Ambulatory Visit (HOSPITAL_COMMUNITY): Payer: Self-pay

## 2021-03-08 ENCOUNTER — Telehealth: Payer: Self-pay

## 2021-03-08 DIAGNOSIS — C9210 Chronic myeloid leukemia, BCR/ABL-positive, not having achieved remission: Secondary | ICD-10-CM

## 2021-03-08 MED ORDER — DASATINIB 100 MG PO TABS
100.0000 mg | ORAL_TABLET | Freq: Every day | ORAL | 11 refills | Status: DC
  Filled 2021-03-08: qty 30, 30d supply, fill #0

## 2021-03-08 NOTE — Telephone Encounter (Signed)
Called and left a message asking her to call the office back. 

## 2021-03-08 NOTE — Telephone Encounter (Signed)
Called per Dr. Alvy Bimler, lab results confirmed CML. Continue Hydrea. Dr. Alvy Bimler sent Rx for Dasatinib to pharmacy and the office is working on prior authorization. She verbalized understanding.

## 2021-03-08 NOTE — Telephone Encounter (Addendum)
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   CMP from 02/28/21 and CBC from 03/04/21 assessed, no lab abnormalities observed.  Current medication list in Epic reviewed, DDIs with acetaminophen (Cat D) and escitalopram (cat C) identified: will follow up with patient if she takes acetaminophen daily or as needed. DDI with escitalopram can enhance Qtc prolongation.  Patient had EKG on 02/28/21 - QT/QTcB 390/438 ms.    Evaluated chart and no patient barriers to medication adherence noted.   Patient agreement for treatment documented in MD note on 03/04/21.  Prescription has been e-scribed to the [facility] for benefits analysis and approval.  Oral Oncology Clinic will continue to follow for insurance authorization, copayment issues, initial counseling and start date.  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    CBC from 03/18/21 assessed, no lab abnormalities observed.   Current medication list in Epic reviewed, DDIs with ibuprofen which is an as needed medication.   Patient agreement for treatment with imatinib is documented in MD note on 03/18/21. Prescription was e-scribed to [facility] but will be sent to [place] due to high copay prices.   Drema Halon, PharmD Hematology/Oncology Clinical Pharmacist [facility] [phone]

## 2021-03-08 NOTE — Telephone Encounter (Signed)
Oral Oncology Patient Advocate Encounter   Received notification from [facility] that prior authorization for Sprycel is required.   PA submitted on CoverMyMeds Key [id number] Status is pending   Oral Oncology Clinic will continue to follow.  [place] Patient [place] Phone [phone] Fax [phone] 03/08/2021 8:52 AM

## 2021-03-11 ENCOUNTER — Other Ambulatory Visit: Payer: Self-pay

## 2021-03-11 ENCOUNTER — Other Ambulatory Visit (HOSPITAL_COMMUNITY): Payer: Self-pay

## 2021-03-11 ENCOUNTER — Other Ambulatory Visit: Payer: Self-pay | Admitting: Hematology and Oncology

## 2021-03-11 ENCOUNTER — Telehealth: Payer: Self-pay

## 2021-03-11 ENCOUNTER — Inpatient Hospital Stay: Payer: BC Managed Care – PPO

## 2021-03-11 DIAGNOSIS — C9210 Chronic myeloid leukemia, BCR/ABL-positive, not having achieved remission: Secondary | ICD-10-CM

## 2021-03-11 LAB — CBC WITH DIFFERENTIAL/PLATELET
Abs Immature Granulocytes: 4.42 10*3/uL — ABNORMAL HIGH (ref 0.00–0.07)
Basophils Absolute: 1.2 10*3/uL — ABNORMAL HIGH (ref 0.0–0.1)
Basophils Relative: 4 %
Eosinophils Absolute: 0.7 10*3/uL — ABNORMAL HIGH (ref 0.0–0.5)
Eosinophils Relative: 2 %
HCT: 39.7 % (ref 36.0–46.0)
Hemoglobin: 13.4 g/dL (ref 12.0–15.0)
Immature Granulocytes: 14 %
Lymphocytes Relative: 13 %
Lymphs Abs: 4.3 10*3/uL — ABNORMAL HIGH (ref 0.7–4.0)
MCH: 33.1 pg (ref 26.0–34.0)
MCHC: 33.8 g/dL (ref 30.0–36.0)
MCV: 98 fL (ref 80.0–100.0)
Monocytes Absolute: 1.8 10*3/uL — ABNORMAL HIGH (ref 0.1–1.0)
Monocytes Relative: 6 %
Neutro Abs: 20 10*3/uL — ABNORMAL HIGH (ref 1.7–7.7)
Neutrophils Relative %: 61 %
Platelets: 345 10*3/uL (ref 150–400)
RBC: 4.05 MIL/uL (ref 3.87–5.11)
RDW: 13.6 % (ref 11.5–15.5)
WBC: 32.4 10*3/uL — ABNORMAL HIGH (ref 4.0–10.5)
nRBC: 0 % (ref 0.0–0.2)

## 2021-03-11 LAB — BCR/ABL

## 2021-03-11 LAB — BCR ABL1 FISH (GENPATH)

## 2021-03-11 MED ORDER — HYDROXYUREA 500 MG PO CAPS
1000.0000 mg | ORAL_CAPSULE | Freq: Every day | ORAL | 0 refills | Status: DC
  Filled 2021-03-11 – 2021-03-12 (×2): qty 30, 15d supply, fill #0

## 2021-03-11 NOTE — Telephone Encounter (Signed)
Called and given below message. She verbalized understanding. 

## 2021-03-11 NOTE — Telephone Encounter (Signed)
-----   Message from [name], MD sent at 03/11/2021  2:33 PM EDT ----- Pls call her Results confirmed CML, trying to get dasatinib approved For now, continue Hydrea 1000 mg daily (tell her to take both together at the same time) I sent refill to pharmacy I hope we will get them approved and ready by next week Please keep her appt as scheduled next Monday

## 2021-03-12 ENCOUNTER — Other Ambulatory Visit (HOSPITAL_COMMUNITY): Payer: Self-pay

## 2021-03-14 ENCOUNTER — Other Ambulatory Visit (HOSPITAL_COMMUNITY): Payer: Self-pay

## 2021-03-18 ENCOUNTER — Other Ambulatory Visit: Payer: Self-pay | Admitting: Hematology and Oncology

## 2021-03-18 ENCOUNTER — Telehealth: Payer: Self-pay

## 2021-03-18 ENCOUNTER — Other Ambulatory Visit: Payer: Self-pay

## 2021-03-18 ENCOUNTER — Inpatient Hospital Stay (HOSPITAL_BASED_OUTPATIENT_CLINIC_OR_DEPARTMENT_OTHER): Payer: BC Managed Care – PPO | Admitting: Genetic Counselor

## 2021-03-18 ENCOUNTER — Encounter: Payer: Self-pay | Admitting: Hematology and Oncology

## 2021-03-18 ENCOUNTER — Inpatient Hospital Stay: Payer: BC Managed Care – PPO

## 2021-03-18 ENCOUNTER — Other Ambulatory Visit (HOSPITAL_COMMUNITY): Payer: Self-pay

## 2021-03-18 ENCOUNTER — Inpatient Hospital Stay: Payer: BC Managed Care – PPO | Admitting: Hematology and Oncology

## 2021-03-18 DIAGNOSIS — Z803 Family history of malignant neoplasm of breast: Secondary | ICD-10-CM

## 2021-03-18 DIAGNOSIS — Z800 Family history of malignant neoplasm of digestive organs: Secondary | ICD-10-CM | POA: Diagnosis not present

## 2021-03-18 DIAGNOSIS — C9210 Chronic myeloid leukemia, BCR/ABL-positive, not having achieved remission: Secondary | ICD-10-CM

## 2021-03-18 LAB — CBC WITH DIFFERENTIAL/PLATELET
Abs Immature Granulocytes: 0.45 10*3/uL — ABNORMAL HIGH (ref 0.00–0.07)
Basophils Absolute: 0.7 10*3/uL — ABNORMAL HIGH (ref 0.0–0.1)
Basophils Relative: 4 %
Eosinophils Absolute: 0.4 10*3/uL (ref 0.0–0.5)
Eosinophils Relative: 2 %
HCT: 39.3 % (ref 36.0–46.0)
Hemoglobin: 13.3 g/dL (ref 12.0–15.0)
Immature Granulocytes: 3 %
Lymphocytes Relative: 16 %
Lymphs Abs: 2.9 10*3/uL (ref 0.7–4.0)
MCH: 33.3 pg (ref 26.0–34.0)
MCHC: 33.8 g/dL (ref 30.0–36.0)
MCV: 98.3 fL (ref 80.0–100.0)
Monocytes Absolute: 1.7 10*3/uL — ABNORMAL HIGH (ref 0.1–1.0)
Monocytes Relative: 9 %
Neutro Abs: 11.9 10*3/uL — ABNORMAL HIGH (ref 1.7–7.7)
Neutrophils Relative %: 66 %
Platelets: 286 10*3/uL (ref 150–400)
RBC: 4 MIL/uL (ref 3.87–5.11)
RDW: 14.6 % (ref 11.5–15.5)
WBC: 18 10*3/uL — ABNORMAL HIGH (ref 4.0–10.5)
nRBC: 0 % (ref 0.0–0.2)

## 2021-03-18 MED ORDER — IMATINIB MESYLATE 400 MG PO TABS
400.0000 mg | ORAL_TABLET | Freq: Every day | ORAL | 11 refills | Status: DC
  Filled 2021-03-18: qty 30, 30d supply, fill #0

## 2021-03-18 NOTE — Telephone Encounter (Signed)
Attempted to call and schedule appt today. Mailbox full and unable to leave a message.

## 2021-03-18 NOTE — Telephone Encounter (Signed)
Added appt at 1230. She is aware of appt time.

## 2021-03-18 NOTE — Telephone Encounter (Signed)
Obtained Sprycel copay card with max of $15,000/year allowance  [facility] [id number] PCN [place] Issuer [id number] ID [id number]  [place] Patient [place] Phone [phone] Fax [phone] 03/18/2021 9:03 AM

## 2021-03-18 NOTE — Telephone Encounter (Signed)
Oral Oncology Patient Advocate Encounter  Prior Authorization for Sprycel has been approved.    PA# [id number] Effective dates: 03/08/21 through 03/07/22  Patients co-pay is $3931  Oral Oncology Clinic will continue to follow.   [place] Patient [place] Phone [phone] Fax [phone] 03/18/2021 8:31 AM

## 2021-03-18 NOTE — Assessment & Plan Note (Signed)
I gave her copies of her recent BCR/ABL result by PCR method which confirmed the diagnosis of CML We discussed the risk, benefits, side effects of Dasatinib versus imatinib My preference would be to get her started on Dasatinib as soon as possible We are having difficulties trying to get her financial assistance to pay for the cost of Dasatinib due to her insurance copayment If were not able to get her secure financial assistance to pay for it, and alternative to treatment would be with imatinib I reviewed with her side effects of imatinib In the meantime, she will continue on hydroxyurea I will touch base with her again at the end of the week Once were able to get her started on tyrosine kinase inhibitor, we will discontinue hydroxyurea She expressed verbal understanding about the plan of care

## 2021-03-18 NOTE — Progress Notes (Signed)
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[name]) I gave her copies of her recent BCR/ABL result by PCR method which confirmed the diagnosis of CML We discussed the risk, benefits, side effects of Dasatinib versus imatinib My preference would be to get her started on Dasatinib as soon as possible We are having difficulties trying to get her financial assistance to pay for the cost of Dasatinib due to her insurance copayment If were not able to get her secure financial assistance to pay for it, and alternative to treatment would be with imatinib I reviewed with her side effects of imatinib In the meantime, she will continue on hydroxyurea I will touch base with her again at the end of the week Once were able to get her started on tyrosine kinase inhibitor, we will discontinue hydroxyurea She expressed verbal understanding about the plan of care  No orders of the defined types were placed in this encounter.   All questions were answered. The patient knows to call the clinic with any problems, questions or concerns. The total time spent in the appointment was 20 minutes encounter with patients including review of chart and various tests results, discussions about plan of care and coordination of care plan   [name], MD 03/18/2021 1:50 PM  INTERVAL HISTORY: Please see below for problem oriented charting. she returns for treatment follow-up for CML.  She is currently taking hydroxyurea She was surprised to find out the high cost of copayment for Dasatinib In the meantime, she is not symptomatic Denies recent fever or chills She has appointment to see genetic counselor due to family history of leukemia  REVIEW OF SYSTEMS:   Constitutional: Denies fevers, chills or abnormal weight loss Eyes: Denies blurriness of vision Ears, nose, mouth, throat, and  face: Denies mucositis or sore throat Respiratory: Denies cough, dyspnea or wheezes Cardiovascular: Denies palpitation, chest discomfort or lower extremity swelling Gastrointestinal:  Denies nausea, heartburn or change in bowel habits Skin: Denies abnormal skin rashes Lymphatics: Denies new lymphadenopathy or easy bruising Neurological:Denies numbness, tingling or new weaknesses Behavioral/Psych: Mood is stable, no new changes  All other systems were reviewed with the patient and are negative.  I have reviewed the past medical history, past surgical history, social history and family history with the patient and they are unchanged from previous note.  ALLERGIES:  is allergic to other, sulfa antibiotics, wellbutrin [bupropion], and piperacillin sod-tazobactam so.  MEDICATIONS:  Current Outpatient Medications  Medication Sig Dispense Refill   acetaminophen (TYLENOL) 325 MG tablet Take 325 mg by mouth every 6 (six) hours as needed (pain).      cholestyramine (QUESTRAN) 4 GM/DOSE powder Take 4 g by mouth daily.     clonazePAM (KLONOPIN) 0.5 MG tablet Take 0.5 mg by mouth 2 (two) times daily as needed for anxiety.     dasatinib (SPRYCEL) 100 MG tablet Take 1 tablet (100 mg total) by mouth daily. 30 tablet 11   escitalopram (LEXAPRO) 20 MG tablet Take 30 mg by mouth daily.     hydroxyurea (HYDREA) 500 MG capsule Take 2 capsules by mouth daily. 30 capsule 0   ibuprofen (ADVIL) 200 MG tablet Take 200 mg by mouth every 6 (six) hours as needed for moderate pain.      Lactobacillus (PROBIOTIC ACIDOPHILUS PO) Take 1 capsule by mouth daily.     loperamide (IMODIUM) 2 MG capsule  Take 2 mg by mouth daily as needed for diarrhea or loose stools.     metaxalone (SKELAXIN) 400 MG tablet Take 1 tablet (400 mg total) by mouth 3 (three) times daily as needed for muscle spasms. 15 tablet 0   Multiple Vitamin (MULTIVITAMIN WITH MINERALS) TABS tablet Take 1 tablet by mouth daily.     No current  facility-administered medications for this visit.    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[place])   03/01/2021 Pathology Results   BCR/ABL by FISH: 91% positive BRC/ABL by PCR: e13a2 (b2a2) by IS is 196.685%     PHYSICAL EXAMINATION: ECOG PERFORMANCE STATUS: 0 - Asymptomatic  Vitals:   03/18/21 1248  BP: 111/73  Pulse: 73  Resp: 18  Temp: 98.2 F (36.8 C)  SpO2: 98%   Filed Weights   03/18/21 1248  Weight: 134 lb 6.4 oz (61 kg)    GENERAL:alert, no distress and comfortable NEURO: alert & oriented x 3 with fluent speech, no focal motor/sensory deficits  LABORATORY DATA:  I have reviewed the data as listed    Component Value Date/Time   NA 142 02/28/2021 1252   K 3.4 (L) 02/28/2021 1252   CL 110 02/28/2021 1252   CO2 23 02/28/2021 1252   GLUCOSE 99 02/28/2021 1252   BUN 17 02/28/2021 1252   CREATININE 0.85 02/28/2021 1252   CALCIUM 9.1 02/28/2021 1252   PROT 6.4 (L) 02/28/2021 1252   ALBUMIN 3.9 02/28/2021 1252   AST 24 02/28/2021 1252   ALT 19 02/28/2021 1252   ALKPHOS 119 02/28/2021 1252   BILITOT 0.5 02/28/2021 1252   GFRNONAA >60 02/28/2021 1252   GFRAA >60 10/21/2019 1020    No results found for: SPEP, UPEP  Lab Results  Component Value Date   WBC 32.4 (H) 03/11/2021   NEUTROABS 20.0 (H) 03/11/2021   HGB 13.4 03/11/2021   HCT 39.7 03/11/2021   MCV 98.0 03/11/2021   PLT 345 03/11/2021      Chemistry      Component Value Date/Time   NA 142 02/28/2021 1252   K 3.4 (L) 02/28/2021 1252   CL 110 02/28/2021 1252   CO2 23 02/28/2021 1252   BUN 17 02/28/2021 1252   CREATININE 0.85 02/28/2021 1252      Component Value Date/Time   CALCIUM 9.1 02/28/2021 1252   ALKPHOS 119 02/28/2021 1252   AST 24 02/28/2021 1252   ALT 19 02/28/2021 1252   BILITOT 0.5 02/28/2021 1252

## 2021-03-19 ENCOUNTER — Other Ambulatory Visit: Payer: Self-pay

## 2021-03-19 DIAGNOSIS — C9210 Chronic myeloid leukemia, BCR/ABL-positive, not having achieved remission: Secondary | ICD-10-CM

## 2021-03-19 MED ORDER — IMATINIB MESYLATE 400 MG PO TABS: 400.0000 mg | ORAL_TABLET | Freq: Every day | ORAL | 11 refills | Status: DC

## 2021-03-20 ENCOUNTER — Encounter: Payer: Self-pay | Admitting: Genetic Counselor

## 2021-03-20 DIAGNOSIS — Z800 Family history of malignant neoplasm of digestive organs: Secondary | ICD-10-CM

## 2021-03-20 HISTORY — DX: Family history of malignant neoplasm of digestive organs: Z80.0

## 2021-03-20 NOTE — Progress Notes (Signed)
REFERRING PROVIDER: Artis Delay, MD [address] [place],  [place] [address]   PRIMARY PROVIDER:  Georgianne Fick, MD  PRIMARY REASON FOR VISIT:  1. CML (chronic myeloid leukemia) (HCC)   2. Family history of breast cancer   3. Family history of pancreatic cancer     HISTORY OF PRESENT ILLNESS:   [name], a 55 y.o. female, was seen for a [place] cancer genetics consultation at the request of Dr. Bertis Ruddy due to a personal and family history of cancer.  [name] presents to clinic today to discuss the possibility of a hereditary predisposition to cancer, to discuss genetic testing, and to further clarify her future cancer risks, as well as potential cancer risks for family members.   In 2022, at the age of 23, [name] was diagnosed with chronic myeloid leukemia.  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   CANCER HISTORY:  Oncology History  CML (chronic myeloid leukemia) (HCC)  03/01/2021 Initial Diagnosis   CML (chronic myeloid leukemia) (HCC)   03/01/2021 Pathology Results   BCR/ABL by FISH: 91% positive BRC/ABL by PCR: e13a2 (b2a2) by IS is 196.685%     RISK FACTORS:  Menarche was at age 39.  Nulliparous.  OCP use for approximately 10 years.  Ovaries intact: no; oophorectomy at age 58  Hysterectomy: yes; at age 4 due to endometriosis  Menopausal status: postmenopausal.  HRT use: 0 years. 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 Mammogram within the last year: yes. Number of breast biopsies: 1; R apocrine metaplasia in 2020 Any excessive radiation exposure in the past: no Dermatology annually.   Past Medical History:  Diagnosis Date   Family history of pancreatic cancer 03/20/2021    Past Surgical History:  Procedure Laterality Date   ABDOMINAL HYSTERECTOMY     APPENDECTOMY     bowel removal     5 inches   BOWEL RESECTION     2 feet   BREAST BIOPSY Right  04/29/2019   APOCRINE METAPLASIA     Social History   Socioeconomic History   Marital status: Single    Spouse name: Not on file   Number of children: 0   Years of education: Not on file   Highest education level: Not on file  Occupational History   Occupation: retired  Tobacco Use   Smoking status: Every Day    Packs/day: 1.00    Years: 17.00    Pack years: 17.00    Types: Cigarettes   Smokeless tobacco: Never  Substance and Sexual Activity   Alcohol use: No   Drug use: No   Sexual activity: Yes  Other Topics Concern   Not on file  Social History Narrative   Not on file   Social Determinants of Health   Financial Resource Strain: Not on file  Food Insecurity: Not on file  Transportation Needs: Not on file  Physical Activity: Not on file  Stress: Not on file  Social Connections: Not on file     FAMILY HISTORY:  We obtained a detailed, 4-generation family history.  Significant diagnoses are listed below: Family History  Problem Relation Age of Onset   Breast cancer Mother        bilateral; dx 57, 55   Acute myelogenous leukemia Mother        dx late 24s   Pancreatic cancer Father 73   Breast cancer Maternal Aunt  dx after 69   Breast cancer Maternal Aunt        dx after 63   Bladder Cancer Paternal Uncle        dx after 4   Colon cancer Maternal Grandmother 81    [name] is unaware of previous family history of genetic testing for hereditary cancer risks. There is no reported Ashkenazi Jewish ancestry. There is no known consanguinity.  GENETIC COUNSELING ASSESSMENT: [name] is a 55 y.o. female with a family history of cancer which is somewhat suggestive of a hereditary cancer syndrome and predisposition to cancer given the presence of related cancers in her maternal family and the presence of pancreatic cancer in her father. We, therefore, discussed and recommended the following at today's visit.   DISCUSSION: We discussed that 5 - 10% of  cancer is hereditary, with most cases of hereditary breast and pancreatic cancer associated with mutations in BRCA1/2.  There are other genes that can be associated with hereditary breast and pancreatic cancer syndromes.  We discussed that most hematologic malignancies are not hereditary; however, there are several genes that can increase risk of hematologic malignancies.  Type of cancer risk and level of risk are gene-specific. We discussed that testing is beneficial for several reasons, including knowing about other cancer risks, identifying potential screening and risk-reduction options that may be appropriate, and to understanding if other family members could be at risk for cancer and allowing them to undergo genetic testing.  We reviewed the characteristics, features and inheritance patterns of hereditary cancer syndromes. We also discussed genetic testing, including the appropriate family members to test, the process of testing, insurance coverage and turn-around-time for results. We discussed the implications of a negative, positive, carrier and/or variant of uncertain significant result. We recommended [name] pursue genetic testing for a panel that contains genes associated with breast, pancreatic, pancreatitis, and hematologic malignancies.  The Multi-Cancer Panel with pancreatitis genes and preliminary pancreatic cancer genes offered by [facility] includes sequencing and/or deletion duplication testing of the following 91 genes: AIP, ALK, APC, ATM, AXIN2,BAP1,  BARD1, BLM, BMPR1A, BRCA1, BRCA2, BRIP1, CASR, CDC73, CDH1, CDK4, CDKN1B, CDKN1C, CDKN2A (p14ARF), CDKN2A (p16INK4a), CEBPA, CFTR, CHEK2, CPA1, CTNNA1, CTRC, DICER1, DIS3L2, EGFR (c.2369C>T, p.Thr790Met variant only), EPCAM (Deletion/duplication testing only), FANCC, FH, FLCN, GATA2, GPC3, GREM1 (Promoter region deletion/duplication testing only), HOXB13 (c.251G>A, p.Gly84Glu), HRAS, KIT, MAX, MEN1, MET, MITF (c.952G>A, p.Glu318Lys variant  only), MLH1, MSH2, MSH3, MSH6, MUTYH, NBN, NF1, NF2, NTHL1, PALB2, PALLD, PDGFRA, PHOX2B, PMS2, POLD1, POLE, POT1, PRKAR1A, PRSS1, PTCH1, PTEN, RAD50, RAD51C, RAD51D, RB1, RECQL4, RET, RNF43, RUNX1, SDHAF2, SDHA (sequence changes only), SDHB, SDHC, SDHD, SMAD4, SMARCA4, SMARCB1, SMARCE1, SPINK1, STK11, SUFU, TERC, TERT, TMEM127, TP53, TSC1, TSC2, VHL, WRN and WT1.    The [facility] Hereditary Myelodysplastic Syndrome/Leukemia Panel includes sequencing and/or deletion duplication testing of the following 32 genes: ANKRD26, ATM, BLM, CBL, CEBPA, DDX41, ELANE, EPCAM, ERCC6L2, ETV6, G6PC3, GATA2, GFI1, HAX1, IKZF1, KRAS, MECOM, MLH1, MSH2, MSH6, NBN, NF1, PMS2, PTPN11, RTEL1, RUNX1, SAMD9, SAMD9L, SRP72, TERC, TERT, TP53.   We discussed that give Ms. Buford has CML, a skin punch biopsy would be required for germline testing.  We will contact her dermatologist, Dr. Renda Rolls of [facility], in attempt to coordinate a biopsy for germline genetic testing.  We discussed that skin punch biopsies present additional costs related to specimen collection and   Based on Ms. Koppel's family history of cancer, she meets medical criteria for genetic testing. Despite that she meets criteria,  she may still have an out of pocket cost. We discussed that if she has an out of pocket cost for testing, the laboratory should reach out to her to discuss self-pay prices and patient pay assistance programs.   PLAN: After considering the risks, benefits, and limitations, Ms. Milroy provided informed consent to pursue genetic testing.  We will contact her dermatologist in attempt  to coordinate a skin punch biopsy.  Ms. Martha will be contacted in the next week regarding biopsy. Results should be available within approximately 3-4 weeks after sample collection, at which point they will be disclosed by telephone to Ms. Harkins, as will any additional recommendations warranted by these results. Ms. Sarchet will receive  a summary of her genetic counseling visit and a copy of her results once available. This information will also be available in Epic.   Lastly, we encouraged Ms. Renn to remain in contact with cancer genetics annually so that we can continuously update the family history and inform her of any changes in cancer genetics and testing that may be of benefit for this family.   Ms. Logiudice questions were answered to her satisfaction today. Our contact information was provided should additional questions or concerns arise. Thank you for the referral and allowing [place] to share in the care of your patient.   Javarius Tsosie M. Joette Catching, [place], [facility] Genetic Counselor [email or web address] (P) [phone]   The patient was seen for a total of 35 minutes in face-to-face genetic counseling.  The patient was seen alone.  Drs. Magrinat, Lindi Adie and/or Burr Medico were available to discuss this case as needed.  _______________________________________________________________________ For Office Staff:  Number of people involved in session: 1 Was an Intern/ student involved with case: no

## 2021-03-22 ENCOUNTER — Other Ambulatory Visit: Payer: Self-pay | Admitting: Hematology and Oncology

## 2021-03-22 ENCOUNTER — Telehealth: Payer: Self-pay

## 2021-03-22 ENCOUNTER — Other Ambulatory Visit (HOSPITAL_COMMUNITY): Payer: Self-pay

## 2021-03-22 MED ORDER — HYDROXYUREA 500 MG PO CAPS
1000.0000 mg | ORAL_CAPSULE | Freq: Every day | ORAL | 0 refills | Status: DC
  Filled 2021-03-22: qty 14, 7d supply, fill #0

## 2021-03-22 NOTE — Telephone Encounter (Signed)
Called and told her Dr. Alvy Bimler sent [name] Rx to [place]. Scheduler will call her for appt on 9/23. She verbalized understanding.

## 2021-03-23 ENCOUNTER — Other Ambulatory Visit (HOSPITAL_COMMUNITY): Payer: Self-pay

## 2021-03-26 ENCOUNTER — Telehealth: Payer: Self-pay | Admitting: Hematology and Oncology

## 2021-03-26 ENCOUNTER — Other Ambulatory Visit: Payer: Self-pay | Admitting: Hematology and Oncology

## 2021-03-26 ENCOUNTER — Telehealth: Payer: Self-pay | Admitting: Genetic Counselor

## 2021-03-26 ENCOUNTER — Telehealth: Payer: Self-pay

## 2021-03-26 DIAGNOSIS — C9210 Chronic myeloid leukemia, BCR/ABL-positive, not having achieved remission: Secondary | ICD-10-CM

## 2021-03-26 NOTE — Telephone Encounter (Signed)
Spoke with [name] to inform her that Dr. Armanda Magic office may reach out to her to schedule appointment for skin punch biopsy for genetic testing.  Requested that she call me back if she hasn't heard from [facility] within 1 week.

## 2021-03-26 NOTE — Telephone Encounter (Signed)
Scheduled per 9/2 sch msg. Called pt and left a msg

## 2021-03-26 NOTE — Telephone Encounter (Signed)
Faxed referral to Dr. Renda Rolls at [phone]. Received confirmation.

## 2021-03-28 MED ORDER — ONDANSETRON HCL 4 MG PO TABS: 4.0000 mg | ORAL_TABLET | Freq: Three times a day (TID) | ORAL | 0 refills | Status: DC | PRN

## 2021-03-28 NOTE — Telephone Encounter (Signed)
Oral Chemotherapy Pharmacist Encounter  I spoke with patient for overview of Gleevec (imatinib) for the treatment of Ph+ CML, planned duration until disease progression or unacceptable toxicity.   Counseled patient on administration, dosing, side effects, monitoring, drug-food interactions, safe handling, storage, and disposal.  Patient will take Gleevec '400mg'$  tablets, 1 tablet ('400mg'$ ) by mouth once daily with a meal and a large glass of water. Patient knows food may decrease stomach irritation and to maintain hydration while on treatment with Gleevec. Patient counseled to avoid grapefruit and grapefruit juice.   Gleevec start date: 03/29/21 Patient informed to stop the Hydrea with the last day taken being 03/28/21.   Adverse effects include but are not limited to: nausea, vomiting, diarrhea, fatigue, muscle cramps, lower extremity edema, rash, decreased blood counts, GI bleeding, and cardiac dysfunction.  Patient does not have anti-emetic on hand although a new medication script for Zofran will be sent in and knows to take it if nausea develops.   Patient has anti diarrheal medication at home and alert the office of 4 or more loose stools above baseline.  Patient requested help with smoking cessation and has tried wellbutrin but had side effects, informed patient that there is no interaction with the patch and that she can try that in addition to the lozenges/ gum to help more with the motion of putting the cigarette into her mouth. She agreed and stated most of the time she is not even inhaling the cigarette it is just the act of doing it. Patient is willing to try these options to stop smoking.    Reviewed with patient importance of keeping a medication schedule and plan for any missed doses. No barriers to medication adherence identified.  Medication reconciliation performed and medication/allergy list updated. Patient notified to try to avoid taking ibuprofen.   Insurance authorization for  [facility] has been obtained. Due to high copay, patient is filling medication through [employer].   Patient informed the she will need to go online and add medication to cart at least one week prior to allow for shipment time as it takes 5-7 days after processing the script in order to decrease the chances of any pause in therapy.   All questions answered.  Mrs. Figuero voiced understanding and appreciation.   Medication education handout placed in mail for patient. Patient knows to call the office with questions or concerns. Oral Chemotherapy Clinic phone number provided to patient.   Drema Halon, PharmD Hematology/Oncology Clinical Pharmacist [facility] [phone] 03/28/2021   9:59 AM

## 2021-04-12 ENCOUNTER — Inpatient Hospital Stay: Payer: BC Managed Care – PPO | Attending: Hematology and Oncology

## 2021-04-12 ENCOUNTER — Inpatient Hospital Stay: Payer: BC Managed Care – PPO | Admitting: Hematology and Oncology

## 2021-04-12 ENCOUNTER — Encounter: Payer: Self-pay | Admitting: Hematology and Oncology

## 2021-04-15 ENCOUNTER — Telehealth: Payer: Self-pay

## 2021-04-15 NOTE — Telephone Encounter (Signed)
Called regarding missed appts on 9/23. She apologized for missing appts. She has been having personal issues and overslept that day.  Appts rescheduled. Lab appt 10/3 and 10/4 appt with Dr. Alvy Bimler. She is aware of appt date/time.  Just FYI

## 2021-04-22 ENCOUNTER — Other Ambulatory Visit: Payer: Self-pay

## 2021-04-22 ENCOUNTER — Inpatient Hospital Stay: Payer: BC Managed Care – PPO | Attending: Hematology and Oncology

## 2021-04-22 DIAGNOSIS — C9210 Chronic myeloid leukemia, BCR/ABL-positive, not having achieved remission: Secondary | ICD-10-CM | POA: Diagnosis present

## 2021-04-22 DIAGNOSIS — F17210 Nicotine dependence, cigarettes, uncomplicated: Secondary | ICD-10-CM | POA: Diagnosis not present

## 2021-04-22 LAB — CBC WITH DIFFERENTIAL/PLATELET
Abs Immature Granulocytes: 0.03 10*3/uL (ref 0.00–0.07)
Basophils Absolute: 0.1 10*3/uL (ref 0.0–0.1)
Basophils Relative: 2 %
Eosinophils Absolute: 0.3 10*3/uL (ref 0.0–0.5)
Eosinophils Relative: 6 %
HCT: 36.7 % (ref 36.0–46.0)
Hemoglobin: 12.9 g/dL (ref 12.0–15.0)
Immature Granulocytes: 1 %
Lymphocytes Relative: 21 %
Lymphs Abs: 1.1 10*3/uL (ref 0.7–4.0)
MCH: 35.1 pg — ABNORMAL HIGH (ref 26.0–34.0)
MCHC: 35.1 g/dL (ref 30.0–36.0)
MCV: 100 fL (ref 80.0–100.0)
Monocytes Absolute: 0.3 10*3/uL (ref 0.1–1.0)
Monocytes Relative: 5 %
Neutro Abs: 3.6 10*3/uL (ref 1.7–7.7)
Neutrophils Relative %: 65 %
Platelets: 250 10*3/uL (ref 150–400)
RBC: 3.67 MIL/uL — ABNORMAL LOW (ref 3.87–5.11)
RDW: 15.4 % (ref 11.5–15.5)
WBC: 5.4 10*3/uL (ref 4.0–10.5)
nRBC: 0 % (ref 0.0–0.2)

## 2021-04-23 ENCOUNTER — Inpatient Hospital Stay: Payer: BC Managed Care – PPO | Admitting: Hematology and Oncology

## 2021-04-23 ENCOUNTER — Encounter: Payer: Self-pay | Admitting: Hematology and Oncology

## 2021-04-23 VITALS — BP 105/66 | HR 80 | Temp 97.7°F | Resp 18 | Ht 64.0 in | Wt 138.0 lb

## 2021-04-23 DIAGNOSIS — F17210 Nicotine dependence, cigarettes, uncomplicated: Secondary | ICD-10-CM

## 2021-04-23 DIAGNOSIS — C9210 Chronic myeloid leukemia, BCR/ABL-positive, not having achieved remission: Secondary | ICD-10-CM

## 2021-04-23 DIAGNOSIS — Z299 Encounter for prophylactic measures, unspecified: Secondary | ICD-10-CM | POA: Diagnosis not present

## 2021-04-23 NOTE — Assessment & Plan Note (Signed)
We discussed importance of nicotine cessation and preventive care

## 2021-04-23 NOTE — Assessment & Plan Note (Signed)
With ongoing plan for chemotherapy, she is considered immunocompromised I wrote her a letter to excuse her from jury duty

## 2021-04-23 NOTE — Assessment & Plan Note (Signed)
Overall, she tolerated treatment well except for mild fatigue She has achieved complete hematological response I will bring her back in a month to check molecular studies along with CBC and CMP

## 2021-04-23 NOTE — Progress Notes (Signed)
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[place]) Overall, she tolerated treatment well except for mild fatigue She has achieved complete hematological response I will bring her back in a month to check molecular studies along with CBC and CMP   Continuous dependence on cigarette smoking We discussed importance of nicotine cessation and preventive care  Preventive measure With ongoing plan for chemotherapy, she is considered immunocompromised I wrote her a letter to excuse her from jury duty  Orders Placed This Encounter  Procedures   Comprehensive metabolic panel    Standing Status:   Standing    Number of Occurrences:   33    Standing Expiration Date:   04/23/2022   BCR-ABL    With RT-PCR technique    Standing Status:   Standing    Number of Occurrences:   22    Standing Expiration Date:   04/23/2022    All questions were answered. The patient knows to call the clinic with any problems, questions or concerns. The total time spent in the appointment was 20 minutes encounter with patients including review of chart and various tests results, discussions about plan of care and coordination of care plan   Natalie Lark, MD 04/23/2021 10:21 AM  INTERVAL HISTORY: Please see below for problem oriented charting. she returns for treatment follow-up on imatinib for CML She missed her appointment recently due to multiple ongoing family events/crisis She recently lost her dog Her horses not feeling well Her sister has significant health issues  Otherwise, she denies recent infection, fever or chills No fluid retention such as leg swelling, shortness of breath or cough  REVIEW OF SYSTEMS:   Constitutional: Denies fevers, chills or abnormal weight loss Eyes: Denies blurriness of vision Ears, nose, mouth, throat, and face: Denies mucositis or sore  throat Respiratory: Denies cough, dyspnea or wheezes Cardiovascular: Denies palpitation, chest discomfort or lower extremity swelling Gastrointestinal:  Denies nausea, heartburn or change in bowel habits Skin: Denies abnormal skin rashes Lymphatics: Denies new lymphadenopathy or easy bruising Neurological:Denies numbness, tingling or new weaknesses Behavioral/Psych: Mood is stable, no new changes  All other systems were reviewed with the patient and are negative.  I have reviewed the past medical history, past surgical history, social history and family history with the patient and they are unchanged from previous note.  ALLERGIES:  is allergic to other, sulfa antibiotics, wellbutrin [bupropion], and piperacillin sod-tazobactam so.  MEDICATIONS:  Current Outpatient Medications  Medication Sig Dispense Refill   acetaminophen (TYLENOL) 325 MG tablet Take 325 mg by mouth every 6 (six) hours as needed (pain).      cholestyramine (QUESTRAN) 4 GM/DOSE powder Take 4 g by mouth daily.     clonazePAM (KLONOPIN) 0.5 MG tablet Take 0.5 mg by mouth 2 (two) times daily as needed for anxiety.     escitalopram (LEXAPRO) 20 MG tablet Take 30 mg by mouth daily.     ibuprofen (ADVIL) 200 MG tablet Take 200 mg by mouth every 6 (six) hours as needed for moderate pain.      imatinib (GLEEVEC) 400 MG tablet Take 1 tablet (400 mg total) by mouth daily. Take with meals and large glass of water.Caution:Chemotherapy. 30 tablet 11   Lactobacillus (PROBIOTIC ACIDOPHILUS PO) Take 1 capsule by mouth daily.     loperamide (IMODIUM) 2 MG capsule Take 2 mg by mouth daily as needed for  diarrhea or loose stools.     metaxalone (SKELAXIN) 400 MG tablet Take 1 tablet (400 mg total) by mouth 3 (three) times daily as needed for muscle spasms. 15 tablet 0   Multiple Vitamin (MULTIVITAMIN WITH MINERALS) TABS tablet Take 1 tablet by mouth daily.     ondansetron (ZOFRAN) 4 MG tablet Take 1 tablet (4 mg total) by mouth every 8  (eight) hours as needed for nausea or vomiting. 30 tablet 0   No current facility-administered medications for this visit.    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[place])   03/01/2021 Pathology Results   BCR/ABL by FISH: 91% positive BRC/ABL by PCR: e13a2 (b2a2) by IS is 196.685%     PHYSICAL EXAMINATION: ECOG PERFORMANCE STATUS: 1 - Symptomatic but completely ambulatory  Vitals:   04/23/21 0831  BP: 105/66  Pulse: 80  Resp: 18  Temp: 97.7 F (36.5 C)  SpO2: 97%   Filed Weights   04/23/21 0831  Weight: 138 lb (62.6 kg)    GENERAL:alert, no distress and comfortable SKIN: skin color, texture, turgor are normal, no rashes or significant lesions EYES: normal, Conjunctiva are pink and non-injected, sclera clear OROPHARYNX:no exudate, no erythema and lips, buccal mucosa, and tongue normal  NECK: supple, thyroid normal size, non-tender, without nodularity LYMPH:  no palpable lymphadenopathy in the cervical, axillary or inguinal LUNGS: clear to auscultation and percussion with normal breathing effort HEART: regular rate & rhythm and no murmurs and no lower extremity edema ABDOMEN:abdomen soft, non-tender and normal bowel sounds Musculoskeletal:no cyanosis of digits and no clubbing  NEURO: alert & oriented x 3 with fluent speech, no focal motor/sensory deficits  LABORATORY DATA:  I have reviewed the data as listed    Component Value Date/Time   NA 142 02/28/2021 1252   K 3.4 (L) 02/28/2021 1252   CL 110 02/28/2021 1252   CO2 23 02/28/2021 1252   GLUCOSE 99 02/28/2021 1252   BUN 17 02/28/2021 1252   CREATININE 0.85 02/28/2021 1252   CALCIUM 9.1 02/28/2021 1252   PROT 6.4 (L) 02/28/2021 1252   ALBUMIN 3.9 02/28/2021 1252   AST 24 02/28/2021 1252   ALT 19 02/28/2021 1252   ALKPHOS 119 02/28/2021 1252   BILITOT 0.5 02/28/2021 1252   GFRNONAA >60 02/28/2021 1252    GFRAA >60 10/21/2019 1020    No results found for: SPEP, UPEP  Lab Results  Component Value Date   WBC 5.4 04/22/2021   NEUTROABS 3.6 04/22/2021   HGB 12.9 04/22/2021   HCT 36.7 04/22/2021   MCV 100.0 04/22/2021   PLT 250 04/22/2021      Chemistry      Component Value Date/Time   NA 142 02/28/2021 1252   K 3.4 (L) 02/28/2021 1252   CL 110 02/28/2021 1252   CO2 23 02/28/2021 1252   BUN 17 02/28/2021 1252   CREATININE 0.85 02/28/2021 1252      Component Value Date/Time   CALCIUM 9.1 02/28/2021 1252   ALKPHOS 119 02/28/2021 1252   AST 24 02/28/2021 1252   ALT 19 02/28/2021 1252   BILITOT 0.5 02/28/2021 1252

## 2021-04-25 ENCOUNTER — Telehealth: Payer: Self-pay

## 2021-04-25 DIAGNOSIS — C9210 Chronic myeloid leukemia, BCR/ABL-positive, not having achieved remission: Secondary | ICD-10-CM

## 2021-04-25 MED ORDER — IMATINIB MESYLATE 400 MG PO TABS: 400.0000 mg | ORAL_TABLET | Freq: Every day | ORAL | 10 refills | Status: DC

## 2021-04-25 NOTE — Telephone Encounter (Signed)
Oral Chemotherapy Pharmacist Encounter  Spoke to representative at [employer] do discuss [place] medication as patient is unable to order her next month supply. Patient was still unable to order from the existing prescription so medication was reordered to send a new prescription in to the pharmacy to delay any pause in therapy.   I will reach out to the patient to inform the prescription was sent in and to check to order the medication online this afternoon.   I will continue to follow to make sure patient receives the medication.   Drema Halon, PharmD Hematology/Oncology Clinical Pharmacist [facility] [phone]

## 2021-05-06 ENCOUNTER — Telehealth: Payer: Self-pay

## 2021-05-06 NOTE — Telephone Encounter (Signed)
Called and given below message. She verbalized understanding.  She appreciated the call back.

## 2021-05-06 NOTE — Telephone Encounter (Signed)
She called and left a message. She is having diarrhea and had a couple of incontinent episodes for a couple of days. She is taking imodium am and pm. She is also taking the cholestyramine powder.  She is asking for suggestions for diarrhea. Thanks

## 2021-05-06 NOTE — Telephone Encounter (Signed)
1) stop imatinib for a few days 2) avoid all dairy 3) schedule imodium 4mg  4 times a day until diarrhea stops 4) we will call her on Thursday for updates

## 2021-05-09 ENCOUNTER — Telehealth: Payer: Self-pay

## 2021-05-09 NOTE — Telephone Encounter (Signed)
Called and given below message. Diarrhea is better. Tuesday she started having constipation and nausea. No bm.Tuesday. She has a history of bowel surgeries and bowel obstructions. This worried her so she adjusted meds. She adjusted the imodium 4 mg to TID and adjusts daily for symptoms. Yesterday she had x 2 bm's,  2nd was normal and not constipated looking. She has not been taking the cholestyramine due to the timing.

## 2021-05-09 NOTE — Telephone Encounter (Signed)
Called and given below message. She verbalized understanding and will call the office back if needed. ?

## 2021-05-09 NOTE — Telephone Encounter (Signed)
I suggest she resume imatinib and call back if problems next week

## 2021-05-09 NOTE — Telephone Encounter (Signed)
-----   Message from [name], MD sent at 05/09/2021 11:04 AM EDT ----- Can you call and ask if her diarrhea has improved?

## 2021-05-15 ENCOUNTER — Inpatient Hospital Stay: Payer: BC Managed Care – PPO

## 2021-05-15 ENCOUNTER — Other Ambulatory Visit: Payer: Self-pay

## 2021-05-15 DIAGNOSIS — C9210 Chronic myeloid leukemia, BCR/ABL-positive, not having achieved remission: Secondary | ICD-10-CM

## 2021-05-15 LAB — CBC WITH DIFFERENTIAL/PLATELET
Abs Immature Granulocytes: 0 10*3/uL (ref 0.00–0.07)
Basophils Absolute: 0.1 10*3/uL (ref 0.0–0.1)
Basophils Relative: 2 %
Eosinophils Absolute: 0.3 10*3/uL (ref 0.0–0.5)
Eosinophils Relative: 4 %
HCT: 38.7 % (ref 36.0–46.0)
Hemoglobin: 13.3 g/dL (ref 12.0–15.0)
Immature Granulocytes: 0 %
Lymphocytes Relative: 25 %
Lymphs Abs: 1.4 10*3/uL (ref 0.7–4.0)
MCH: 34.5 pg — ABNORMAL HIGH (ref 26.0–34.0)
MCHC: 34.4 g/dL (ref 30.0–36.0)
MCV: 100.3 fL — ABNORMAL HIGH (ref 80.0–100.0)
Monocytes Absolute: 0.4 10*3/uL (ref 0.1–1.0)
Monocytes Relative: 7 %
Neutro Abs: 3.6 10*3/uL (ref 1.7–7.7)
Neutrophils Relative %: 62 %
Platelets: 200 10*3/uL (ref 150–400)
RBC: 3.86 MIL/uL — ABNORMAL LOW (ref 3.87–5.11)
RDW: 14.1 % (ref 11.5–15.5)
WBC: 5.8 10*3/uL (ref 4.0–10.5)
nRBC: 0 % (ref 0.0–0.2)

## 2021-05-15 LAB — COMPREHENSIVE METABOLIC PANEL
ALT: 18 U/L (ref 0–44)
AST: 23 U/L (ref 15–41)
Albumin: 4 g/dL (ref 3.5–5.0)
Alkaline Phosphatase: 218 U/L — ABNORMAL HIGH (ref 38–126)
Anion gap: 9 (ref 5–15)
BUN: 14 mg/dL (ref 6–20)
CO2: 23 mmol/L (ref 22–32)
Calcium: 8.8 mg/dL — ABNORMAL LOW (ref 8.9–10.3)
Chloride: 107 mmol/L (ref 98–111)
Creatinine, Ser: 1.07 mg/dL — ABNORMAL HIGH (ref 0.44–1.00)
GFR, Estimated: >60 mL/min (ref >60)
Glucose, Bld: 103 mg/dL — ABNORMAL HIGH (ref 70–99)
Potassium: 4 mmol/L (ref 3.5–5.1)
Sodium: 139 mmol/L (ref 135–145)
Total Bilirubin: 0.3 mg/dL (ref 0.3–1.2)
Total Protein: 6.6 g/dL (ref 6.5–8.1)

## 2021-05-21 ENCOUNTER — Encounter: Payer: Self-pay | Admitting: Genetic Counselor

## 2021-05-21 ENCOUNTER — Telehealth: Payer: Self-pay | Admitting: Genetic Counselor

## 2021-05-21 DIAGNOSIS — Z1502 Genetic susceptibility to malignant neoplasm of ovary: Secondary | ICD-10-CM | POA: Insufficient documentation

## 2021-05-21 DIAGNOSIS — Z1501 Genetic susceptibility to malignant neoplasm of breast: Secondary | ICD-10-CM

## 2021-05-21 DIAGNOSIS — Z1379 Encounter for other screening for genetic and chromosomal anomalies: Secondary | ICD-10-CM | POA: Insufficient documentation

## 2021-05-21 DIAGNOSIS — Z1589 Genetic susceptibility to other disease: Secondary | ICD-10-CM

## 2021-05-21 HISTORY — DX: Genetic susceptibility to malignant neoplasm of breast: Z15.01

## 2021-05-21 HISTORY — DX: Genetic susceptibility to other disease: Z15.89

## 2021-05-21 NOTE — Telephone Encounter (Signed)
Contacted patient in attempt to disclose results of genetic testing.  LVM with contact information requesting a call back.  

## 2021-05-22 LAB — BCR/ABL

## 2021-05-24 ENCOUNTER — Encounter: Payer: Self-pay | Admitting: Hematology and Oncology

## 2021-05-24 ENCOUNTER — Inpatient Hospital Stay: Payer: BC Managed Care – PPO | Attending: Hematology and Oncology | Admitting: Hematology and Oncology

## 2021-05-24 DIAGNOSIS — C9210 Chronic myeloid leukemia, BCR/ABL-positive, not having achieved remission: Secondary | ICD-10-CM | POA: Insufficient documentation

## 2021-05-24 DIAGNOSIS — R197 Diarrhea, unspecified: Secondary | ICD-10-CM | POA: Insufficient documentation

## 2021-05-24 NOTE — Assessment & Plan Note (Signed)
Overall, she tolerated treatment by other by adjusting the use of Imodium for diarrhea Within the month of taking treatment, her PCR test has improved dramatically We discussed milestones to be achieved by third month of treatment For the first 3 months, I recommend monthly blood work and evaluation Once we have achieved molecular response, we can space out her future appointment

## 2021-05-24 NOTE — Progress Notes (Signed)
HEMATOLOGY-ONCOLOGY ELECTRONIC VISIT PROGRESS NOTE  Patient Care Team: Merrilee Seashore, MD as PCP - General (Internal Medicine)  I connected with the patient via telephone conference and verified that I am speaking with the correct person using two identifiers. The patient's location is at home and I am providing care from the [facility] I discussed the limitations, risks, security and privacy concerns of performing an evaluation and management service by e-visits and the availability of in person appointments.  I also discussed with the patient that there may be a patient responsible charge related to this service. The patient expressed understanding and agreed to proceed.   ASSESSMENT & PLAN:  CML (chronic myeloid leukemia) ([facility]) Overall, she tolerated treatment by other by adjusting the use of Imodium for diarrhea Within the month of taking treatment, her PCR test has improved dramatically We discussed milestones to be achieved by third month of treatment For the first 3 months, I recommend monthly blood work and evaluation Once we have achieved molecular response, we can space out her future appointment   Diarrhea She has multifactorial causes of diarrhea She will continue to take Imodium as needed  No orders of the defined types were placed in this encounter.   INTERVAL HISTORY: Please see below for problem oriented charting. 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[place])   03/01/2021 Pathology Results   BCR/ABL by  FISH: 91% positive BRC/ABL by PCR: e13a2 (b2a2) by IS is 196.685%   04/25/2021 -  Chemotherapy   She started taking imatinib   05/15/2021 Pathology Results   BRC/ABL is detected by PCR: e13a2 (b2a2) by IS is 70.731%     REVIEW OF SYSTEMS:   Constitutional: Denies fevers, chills or abnormal weight loss Eyes: Denies blurriness of vision Ears, nose, mouth, throat, and face: Denies mucositis or sore throat Respiratory: Denies cough, dyspnea or wheezes Cardiovascular: Denies palpitation, chest discomfort Skin: Denies abnormal skin rashes Lymphatics: Denies new lymphadenopathy or easy bruising Neurological:Denies numbness, tingling or new weaknesses Behavioral/Psych: Mood is stable, no new changes  Extremities: No lower extremity edema All other systems were reviewed with the patient and are negative.  I have reviewed the past medical history, past surgical history, social history and family history with the patient and they are unchanged from previous note.  ALLERGIES:  is allergic to other, sulfa antibiotics, wellbutrin [bupropion], and piperacillin sod-tazobactam so.  MEDICATIONS:  Current Outpatient Medications  Medication Sig Dispense Refill   acetaminophen (TYLENOL) 325 MG tablet Take 325 mg by mouth every 6 (six) hours as needed (pain).      cholestyramine (QUESTRAN) 4 GM/DOSE powder Take 4 g by mouth daily.     clonazePAM (KLONOPIN) 0.5 MG tablet Take 0.5 mg by mouth 2 (two) times daily as needed for anxiety.     escitalopram (LEXAPRO) 20 MG tablet Take 30 mg by mouth daily.     ibuprofen (ADVIL) 200 MG tablet Take 200 mg by mouth every 6 (six) hours as needed for moderate pain.      imatinib (GLEEVEC) 400 MG tablet Take  1 tablet (400 mg total) by mouth daily. Take with meals and large glass of water.Caution:Chemotherapy. 30 tablet 10   Lactobacillus (PROBIOTIC ACIDOPHILUS PO) Take 1 capsule by mouth daily.     loperamide (IMODIUM) 2 MG capsule Take 2 mg by mouth daily as  needed for diarrhea or loose stools.     metaxalone (SKELAXIN) 400 MG tablet Take 1 tablet (400 mg total) by mouth 3 (three) times daily as needed for muscle spasms. 15 tablet 0   Multiple Vitamin (MULTIVITAMIN WITH MINERALS) TABS tablet Take 1 tablet by mouth daily.     ondansetron (ZOFRAN) 4 MG tablet Take 1 tablet (4 mg total) by mouth every 8 (eight) hours as needed for nausea or vomiting. 30 tablet 0   No current facility-administered medications for this visit.    PHYSICAL EXAMINATION: ECOG PERFORMANCE STATUS: 1 - Symptomatic but completely ambulatory  LABORATORY DATA:  I have reviewed the data as listed CMP Latest Ref Rng & Units 05/15/2021 02/28/2021 10/21/2019  Glucose 70 - 99 mg/dL 103(H) 99 99  BUN 6 - 20 mg/dL 14 17 18   Creatinine 0.44 - 1.00 mg/dL 1.07(H) 0.85 0.88  Sodium 135 - 145 mmol/L 139 142 139  Potassium 3.5 - 5.1 mmol/L 4.0 3.4(L) 3.8  Chloride 98 - 111 mmol/L 107 110 105  CO2 22 - 32 mmol/L 23 23 25   Calcium 8.9 - 10.3 mg/dL 8.8(L) 9.1 8.8(L)  Total Protein 6.5 - 8.1 g/dL 6.6 6.4(L) 6.4(L)  Total Bilirubin 0.3 - 1.2 mg/dL 0.3 0.5 0.4  Alkaline Phos 38 - 126 U/L 218(H) 119 136(H)  AST 15 - 41 U/L 23 24 24   ALT 0 - 44 U/L 18 19 25     Lab Results  Component Value Date   WBC 5.8 05/15/2021   HGB 13.3 05/15/2021   HCT 38.7 05/15/2021   MCV 100.3 (H) 05/15/2021   PLT 200 05/15/2021   NEUTROABS 3.6 05/15/2021   I discussed the assessment and treatment plan with the patient. The patient was provided an opportunity to ask questions and all were answered. The patient agreed with the plan and demonstrated an understanding of the instructions. The patient was advised to call back or seek an in-person evaluation if the symptoms worsen or if the condition fails to improve as anticipated.    I spent 20 minutes for the appointment reviewing test results, discuss management and coordination of care.  [name], MD 05/24/2021 1:31 PM

## 2021-05-24 NOTE — Assessment & Plan Note (Signed)
She has multifactorial causes of diarrhea She will continue to take Imodium as needed

## 2021-06-11 ENCOUNTER — Telehealth: Payer: Self-pay | Admitting: Genetic Counselor

## 2021-06-11 ENCOUNTER — Encounter: Payer: Self-pay | Admitting: Genetic Counselor

## 2021-06-11 NOTE — Telephone Encounter (Signed)
Called to disclose genetic testing results.  Unable to LVM due to full mailbox. Second attempt. Letter send via MyChart to contact to discuss results.

## 2021-06-17 ENCOUNTER — Inpatient Hospital Stay: Payer: BC Managed Care – PPO

## 2021-06-17 ENCOUNTER — Other Ambulatory Visit: Payer: Self-pay

## 2021-06-17 ENCOUNTER — Telehealth: Payer: Self-pay | Admitting: Genetic Counselor

## 2021-06-17 DIAGNOSIS — C9210 Chronic myeloid leukemia, BCR/ABL-positive, not having achieved remission: Secondary | ICD-10-CM

## 2021-06-17 LAB — CBC WITH DIFFERENTIAL/PLATELET
Abs Immature Granulocytes: 0.01 10*3/uL (ref 0.00–0.07)
Basophils Absolute: 0.1 10*3/uL (ref 0.0–0.1)
Basophils Relative: 1 %
Eosinophils Absolute: 0.3 10*3/uL (ref 0.0–0.5)
Eosinophils Relative: 4 %
HCT: 38.2 % (ref 36.0–46.0)
Hemoglobin: 13.1 g/dL (ref 12.0–15.0)
Immature Granulocytes: 0 %
Lymphocytes Relative: 31 %
Lymphs Abs: 2 10*3/uL (ref 0.7–4.0)
MCH: 34.3 pg — ABNORMAL HIGH (ref 26.0–34.0)
MCHC: 34.3 g/dL (ref 30.0–36.0)
MCV: 100 fL (ref 80.0–100.0)
Monocytes Absolute: 0.3 10*3/uL (ref 0.1–1.0)
Monocytes Relative: 5 %
Neutro Abs: 3.8 10*3/uL (ref 1.7–7.7)
Neutrophils Relative %: 59 %
Platelets: 218 10*3/uL (ref 150–400)
RBC: 3.82 MIL/uL — ABNORMAL LOW (ref 3.87–5.11)
RDW: 12.9 % (ref 11.5–15.5)
WBC: 6.4 10*3/uL (ref 4.0–10.5)
nRBC: 0 % (ref 0.0–0.2)

## 2021-06-17 LAB — COMPREHENSIVE METABOLIC PANEL
ALT: 17 U/L (ref 0–44)
AST: 25 U/L (ref 15–41)
Albumin: 4 g/dL (ref 3.5–5.0)
Alkaline Phosphatase: 146 U/L — ABNORMAL HIGH (ref 38–126)
Anion gap: 8 (ref 5–15)
BUN: 14 mg/dL (ref 6–20)
CO2: 25 mmol/L (ref 22–32)
Calcium: 8.7 mg/dL — ABNORMAL LOW (ref 8.9–10.3)
Chloride: 109 mmol/L (ref 98–111)
Creatinine, Ser: 1.01 mg/dL — ABNORMAL HIGH (ref 0.44–1.00)
GFR, Estimated: >60 mL/min (ref >60)
Glucose, Bld: 119 mg/dL — ABNORMAL HIGH (ref 70–99)
Potassium: 3.6 mmol/L (ref 3.5–5.1)
Sodium: 142 mmol/L (ref 135–145)
Total Bilirubin: 0.4 mg/dL (ref 0.3–1.2)
Total Protein: 6.2 g/dL — ABNORMAL LOW (ref 6.5–8.1)

## 2021-06-17 NOTE — Telephone Encounter (Signed)
Received VM regarding results disclosure.  Returned call.  Unable to LVM.

## 2021-06-17 NOTE — Telephone Encounter (Signed)
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  Discussed cancer risks, management, and family implications.

## 2021-06-18 ENCOUNTER — Ambulatory Visit: Payer: Self-pay | Admitting: Genetic Counselor

## 2021-06-18 DIAGNOSIS — Z803 Family history of malignant neoplasm of breast: Secondary | ICD-10-CM

## 2021-06-18 DIAGNOSIS — Z1379 Encounter for other screening for genetic and chromosomal anomalies: Secondary | ICD-10-CM

## 2021-06-18 DIAGNOSIS — Z800 Family history of malignant neoplasm of digestive organs: Secondary | ICD-10-CM

## 2021-06-18 DIAGNOSIS — Z1501 Genetic susceptibility to malignant neoplasm of breast: Secondary | ICD-10-CM

## 2021-06-18 DIAGNOSIS — Z1509 Genetic susceptibility to other malignant neoplasm: Secondary | ICD-10-CM

## 2021-06-18 DIAGNOSIS — C9210 Chronic myeloid leukemia, BCR/ABL-positive, not having achieved remission: Secondary | ICD-10-CM

## 2021-06-18 NOTE — Progress Notes (Signed)
HPI:   [name] was previously seen in the [place] clinic due to a personal and family history of cancer and concerns regarding a hereditary predisposition to cancer. Please refer to our prior cancer genetics clinic note for more information regarding our discussion, assessment and recommendations, at the time. [name] recent genetic test results were disclosed to her, as were recommendations warranted by these results. These results and recommendations are discussed in more detail below.  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  Significant diagnoses are listed below: Family History  Problem Relation Age of Onset   Breast cancer Mother        bilateral; dx 45, 66   Acute myelogenous leukemia Mother        dx late 68s   Pancreatic cancer Father 58   Breast cancer Maternal Aunt        dx after 30   Breast cancer Maternal Aunt        dx after 50   Bladder Cancer Paternal Uncle        dx after 29   Colon cancer Maternal Grandmother 81   Ms. Zulauf is unaware of previous family history of genetic testing for hereditary cancer risks. There is no reported Ashkenazi Jewish ancestry. There is no known consanguinity.  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  No other deleterious or uncertain results were detected in the custom pan-cancer panel through [facility].  Panel included sequencing and  deletion/duplication studies of the following 108 genes: AIP, ALK, ANKRD26*, APC*, ATM*, AXIN2, BAP1, BARD1, BLM, BMPR1A, BRCA1, BRCA2, BRIP1, CASR, CBL, CDC73, CDH1, CDK4, CDKN1B, CDKN1C, CDKN2A (p14ARF), CDKN2A (p16INK4a), CEBPA, CFTR*, CHEK2, CPA1, CTNNA1, CTRC, DDX41, DICER1*, DIS3L2*, EGFR, ELANE, EPCAM*, ERCC6L2, ETV6, FANCC, FH*, FLCN, G6PC3, GATA2, GFI1*, GPC3*, GREM1*, HAX1, HOXB13, HRAS, IKZF1, KIT, KRAS, MAX*, MECOM, MEN1*, MET*, MITF, MLH1*, MSH2*, MSH3*, MSH6*, MUTYH, NBN, NF1*, NF2, NTHL1, PALB2, PALLD, PDGFRA, PHOX2B*, PMS2*, POLD1*, POLE, POT1, PRKAR1A, PRSS1*, PTCH1, PTEN*, PTPN11, RAD50, RAD51C, RAD51D, RB1*, RECQL4*, RET, RTEL1, RUNX1, SAMD9, SAMD9L, SDHA*, SDHAF2, SDHB, SDHC*, SDHD, SMAD4, SMARCA4, SMARCB1, SMARCE1, SPINK1, SRP72, STK11, SUFU, TERC, TERT, TMEM127, TP53, TSC1*, TSC2, VHL, WRN*, WT1.   The test report has been scanned into EPIC and is located under the Molecular Pathology section of the Results Review tab.  A portion of the result report is included below for reference. Genetic testing reported out on May 18, 2021.     Cancer Risks for CHEK2: Females have approximately a 20-40% lifetime risk of breast cancer. Both males and females have a 8-9% lifetime risk of colorectal cancer Males are thought to be at an increased risk of prostate cancer. The exact risk figure is unknown at this time. Limited data suggests a possible increased risk for ovarian, endometrial, thyroid, and other types of cancer    Research is continuing to help learn more about the cancers associated with CHEK2 pathogenic variants and what  the exact risks are to develop these cancers.  Management Recommendations:  Breast Cancer Screening/Risk Reduction:  Females: Breast cancer screening includes: Breast awareness beginning at age 59 Monthly self-breast examination beginning at age 17 Clinical breast examination every 6-12 months beginning at age 47 or at the age of the earliest diagnosed  breast cancer in the family, if onset was before age 48 Annual mammogram starting at age 28 or 50 years prior to the youngest age of diagnosis, whichever comes first Consider breast MRI with contrast starting at age 61-35 Evidence is insufficient for a prophylactic risk-reducing mastectomy, manage based on family history    Colon Cancer Screening: Colonoscopy screening every 5 years beginning at age 62 If an individual has a first-degree relative with colorectal cancer, screening should begin 10 years prior to the relative's age at diagnosis if before 48. If an individual has a personal history of colorectal cancer, screening recommendations should be based on recommendations for post-colorectal cancer resection.   Prostate Cancer Screening: It has been suggested that males with a CHEK2 pathogenic variant and a first-degree relative with prostate cancer have an annual prostate-specific antigen (PSA). However, the benefits of screening for prostate cancer among males with a pathogenic variant in CHEK2 are uncertain. Consider beginning annual PSA blood test and digital rectal exams at age 39-45   This information is based on current understanding of the gene and may change in the future.   Implications for Family Members: Hereditary predisposition to cancer due to pathogenic variants in the CHEK2 gene has autosomal dominant inheritance. This means that first degree relatives (parents, siblings, children) of those with a pathogenic variant have a 50% chance of having the same pathogenic variant. Identification of a pathogenic variant allows for the recognition of at-risk relatives who can pursue testing for the familial variant.   Family members are encouraged to consider genetic testing for this familial pathogenic variant. As there are generally no childhood cancer risks associated with pathogenic variants in the CHEK2 gene, individuals in the family are not recommended to have testing until  they reach at least 53 years of age. Complimentary testing for the familial variant is available for 150 days after [name]'s report date. They may contact our office at [phone] for more information or to schedule an appointment. Family members who live outside of the area are encouraged to find a genetic counselor in their area by visiting: [email or web address].   Resources: FORCE (Facing Our Risk of Cancer Empowered) is a resource for those with a hereditary predisposition to develop cancer.  FORCE provides information about risk reduction, advocacy, legislation, and clinical trials.  Additionally, [employer]CE provides a platform for collaboration and support; which includes: peer navigation, message boards, local support groups, a toll-free helpline, research registry and recruitment, advocate training, published medical research, webinars, brochures, mastectomy photos, and more.  For more information, visit www.facingourrisk.org   Plan:  Ms. Isip may speak with Dr. Alvy Bimler regarding current recommendations regarding breast cancer screening, including the consideration for breast MRI.  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 Ms. [facility] gastroenterologist is Dr. Cristina Gong through [facility].  She knows to speak with her gastroenterology team regarding personalized colon cancer screening.  Ms. Weyers plans to inform family members of the CHEK2 pathogenic variant, so family members can seek appropriate genetic counseling/testing.   Our contact number was provided. Ms. Kelm questions were answered to her satisfaction, and she knows she is welcome to call us at anytime with additional  questions or concerns.    M. Joette Catching, [place], [facility] Genetic Counselor [email or web address] (P) [phone]

## 2021-06-19 ENCOUNTER — Ambulatory Visit: Payer: Self-pay | Admitting: Genetic Counselor

## 2021-06-19 ENCOUNTER — Encounter: Payer: Self-pay | Admitting: Genetic Counselor

## 2021-06-24 LAB — BCR/ABL

## 2021-06-28 ENCOUNTER — Encounter: Payer: Self-pay | Admitting: Hematology and Oncology

## 2021-06-28 ENCOUNTER — Telehealth: Payer: Self-pay | Admitting: Hematology and Oncology

## 2021-06-28 ENCOUNTER — Inpatient Hospital Stay: Payer: BC Managed Care – PPO | Attending: Hematology and Oncology | Admitting: Hematology and Oncology

## 2021-06-28 DIAGNOSIS — C9210 Chronic myeloid leukemia, BCR/ABL-positive, not having achieved remission: Secondary | ICD-10-CM | POA: Diagnosis present

## 2021-06-28 DIAGNOSIS — R197 Diarrhea, unspecified: Secondary | ICD-10-CM | POA: Diagnosis not present

## 2021-06-28 DIAGNOSIS — Z1589 Genetic susceptibility to other disease: Secondary | ICD-10-CM

## 2021-06-28 DIAGNOSIS — F17210 Nicotine dependence, cigarettes, uncomplicated: Secondary | ICD-10-CM | POA: Insufficient documentation

## 2021-06-28 DIAGNOSIS — Z1509 Genetic susceptibility to other malignant neoplasm: Secondary | ICD-10-CM | POA: Diagnosis not present

## 2021-06-28 DIAGNOSIS — Z1501 Genetic susceptibility to malignant neoplasm of breast: Secondary | ICD-10-CM | POA: Diagnosis not present

## 2021-06-28 DIAGNOSIS — Z1502 Genetic susceptibility to malignant neoplasm of ovary: Secondary | ICD-10-CM

## 2021-06-28 NOTE — Assessment & Plan Note (Signed)
We discussed the risk of breast cancer and implication to other family members We discussed risk and benefits of chemoprevention with tamoxifen along with additional screening modality with MRI of the breast She is interested to proceed with MRI screening of the breast She will think about the risk of tamoxifen I am most concerned about risk of thromboembolism due to her continuous dependence on cigarette smoking I will see her back in January for further discussion

## 2021-06-28 NOTE — Assessment & Plan Note (Signed)
I have reviewed her blood work and the molecular studies So far, she tolerated treatment better with less diarrhea I cannot explain her facial puffiness I recommend we continue treatment I plan to order repeat blood work again first week of January We would like to reach the goal of getting her PCR test to less than 10% to reach her milestone I will see her middle of January in person for further follow-up

## 2021-06-28 NOTE — Assessment & Plan Note (Signed)
We discussed the benefit of smoking cessation and she will try her best to quit smoking

## 2021-06-28 NOTE — Assessment & Plan Note (Signed)
This is improved She will continue to take Imodium as needed

## 2021-06-28 NOTE — Telephone Encounter (Signed)
Scheduled per 12/9 los, attempted to call pt, voicemail was full, will mail calender

## 2021-06-28 NOTE — Progress Notes (Signed)
HEMATOLOGY-ONCOLOGY ELECTRONIC VISIT PROGRESS NOTE  Patient Care Team: Georgianne Fick, MD as PCP - General (Internal Medicine)  I connected with the patient via telephone conference and verified that I am speaking with the correct person using two identifiers. The patient's location is at home and I am providing care from the [facility] I discussed the limitations, risks, security and privacy concerns of performing an evaluation and management service by e-visits and the availability of in person appointments.  I also discussed with the patient that there may be a patient responsible charge related to this service. The patient expressed understanding and agreed to proceed.   ASSESSMENT & PLAN:  CML (chronic myeloid leukemia) (HCC) I have reviewed her blood work and the molecular studies So far, she tolerated treatment better with less diarrhea I cannot explain her facial puffiness I recommend we continue treatment I plan to order repeat blood work again first week of January We would like to reach the goal of getting her PCR test to less than 10% to reach her milestone I will see her middle of January in person for further follow-up  Diarrhea This is improved She will continue to take Imodium as needed  Monoallelic mutation of CHEK2 gene in female patient We discussed the risk of breast cancer and implication to other family members We discussed risk and benefits of chemoprevention with tamoxifen along with additional screening modality with MRI of the breast She is interested to proceed with MRI screening of the breast She will think about the risk of tamoxifen I am most concerned about risk of thromboembolism due to her continuous dependence on cigarette smoking I will see her back in January for further discussion  Continuous dependence on cigarette smoking We discussed the benefit of smoking cessation and she will try her best to quit smoking  Orders Placed This  Encounter  Procedures   MR BREAST BILATERAL W WO CONTRAST INC CAD    Standing Status:   Future    Standing Expiration Date:   06/28/2022    Order Specific Question:   If indicated for the ordered procedure, I authorize the administration of contrast media per Radiology protocol    Answer:   Yes    Order Specific Question:   What is the patient's sedation requirement?    Answer:   No Sedation    Order Specific Question:   Does the patient have a pacemaker or implanted devices?    Answer:   No    Order Specific Question:   Preferred imaging location?    Answer:   [facility] (table limit - 550 lbs)    INTERVAL HISTORY: Please see below for problem oriented charting. The purpose of today's discussion is further follow-up for CML and recent findings of genetic mutation with CH EK 2 She tolerated treatment better She have less diarrhea but complain of intermittent facial puffiness Denies generalized edema No recent cough, chest pain or shortness of breath Unfortunately, she continues to smoke In regards to recent genetic mutation findings, she stated that her mother had been on tamoxifen She denies prior abnormal imaging study of her breast I have reviewed her last mammogram in February which showed dense breast tissue but normal  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HCC)  03/01/2021 Initial Diagnosis   CML (chronic myeloid leukemia) (HCC)   03/01/2021 Pathology Results   BCR/ABL by FISH: 91% positive BRC/ABL by PCR: e13a2 (b2a2) by IS is 196.685%  04/25/2021 -  Chemotherapy   She started taking imatinib   05/15/2021 Pathology Results   BRC/ABL is detected by PCR: e13a2 (b2a2) by IS is 70.731%   06/17/2021 Pathology Results   e13a2 (b2a2) by Quantidex (IS): 39.7401%     REVIEW OF SYSTEMS:   Constitutional: Denies fevers, chills or abnormal weight loss Eyes: Denies blurriness of vision Ears, nose, mouth, throat, and face:  Denies mucositis or sore throat Respiratory: Denies cough, dyspnea or wheezes Cardiovascular: Denies palpitation, chest discomfort Gastrointestinal:  Denies nausea, heartburn or change in bowel habits Skin: Denies abnormal skin rashes Lymphatics: Denies new lymphadenopathy or easy bruising Neurological:Denies numbness, tingling or new weaknesses Behavioral/Psych: Mood is stable, no new changes  Extremities: No lower extremity edema All other systems were reviewed with the patient and are negative.  I have reviewed the past medical history, past surgical history, social history and family history with the patient and they are unchanged from previous note.  ALLERGIES:  is allergic to other, sulfa antibiotics, wellbutrin [bupropion], and piperacillin sod-tazobactam so.  MEDICATIONS:  Current Outpatient Medications  Medication Sig Dispense Refill   acetaminophen (TYLENOL) 325 MG tablet Take 325 mg by mouth every 6 (six) hours as needed (pain).      cholestyramine (QUESTRAN) 4 GM/DOSE powder Take 4 g by mouth daily.     clonazePAM (KLONOPIN) 0.5 MG tablet Take 0.5 mg by mouth 2 (two) times daily as needed for anxiety.     escitalopram (LEXAPRO) 20 MG tablet Take 30 mg by mouth daily.     ibuprofen (ADVIL) 200 MG tablet Take 200 mg by mouth every 6 (six) hours as needed for moderate pain.      imatinib (GLEEVEC) 400 MG tablet Take 1 tablet (400 mg total) by mouth daily. Take with meals and large glass of water.Caution:Chemotherapy. 30 tablet 10   Lactobacillus (PROBIOTIC ACIDOPHILUS PO) Take 1 capsule by mouth daily.     loperamide (IMODIUM) 2 MG capsule Take 2 mg by mouth daily as needed for diarrhea or loose stools.     metaxalone (SKELAXIN) 400 MG tablet Take 1 tablet (400 mg total) by mouth 3 (three) times daily as needed for muscle spasms. 15 tablet 0   Multiple Vitamin (MULTIVITAMIN WITH MINERALS) TABS tablet Take 1 tablet by mouth daily.     ondansetron (ZOFRAN) 4 MG tablet Take 1 tablet  (4 mg total) by mouth every 8 (eight) hours as needed for nausea or vomiting. 30 tablet 0   No current facility-administered medications for this visit.    PHYSICAL EXAMINATION: ECOG PERFORMANCE STATUS: 1 - Symptomatic but completely ambulatory  LABORATORY DATA:  I have reviewed the data as listed CMP Latest Ref Rng & Units 06/17/2021 05/15/2021 02/28/2021  Glucose 70 - 99 mg/dL 119(H) 103(H) 99  BUN 6 - 20 mg/dL $Remove'14 14 17  'fXVuuMc$ Creatinine 0.44 - 1.00 mg/dL 1.01(H) 1.07(H) 0.85  Sodium 135 - 145 mmol/L 142 139 142  Potassium 3.5 - 5.1 mmol/L 3.6 4.0 3.4(L)  Chloride 98 - 111 mmol/L 109 107 110  CO2 22 - 32 mmol/L $RemoveB'25 23 23  'GhkfZZeM$ Calcium 8.9 - 10.3 mg/dL 8.7(L) 8.8(L) 9.1  Total Protein 6.5 - 8.1 g/dL 6.2(L) 6.6 6.4(L)  Total Bilirubin 0.3 - 1.2 mg/dL 0.4 0.3 0.5  Alkaline Phos 38 - 126 U/L 146(H) 218(H) 119  AST 15 - 41 U/L $Remo'25 23 24  'pxUOY$ ALT 0 - 44 U/L $Remo'17 18 19    'SCfUM$ Lab Results  Component Value Date   WBC 6.4  06/17/2021   HGB 13.1 06/17/2021   HCT 38.2 06/17/2021   MCV 100.0 06/17/2021   PLT 218 06/17/2021   NEUTROABS 3.8 06/17/2021   I discussed the assessment and treatment plan with the patient. The patient was provided an opportunity to ask questions and all were answered. The patient agreed with the plan and demonstrated an understanding of the instructions. The patient was advised to call back or seek an in-person evaluation if the symptoms worsen or if the condition fails to improve as anticipated.    I spent 30 minutes for the appointment reviewing test results, discuss management and coordination of care.  [name], MD 06/28/2021 12:06 PM

## 2021-07-21 DIAGNOSIS — C801 Malignant (primary) neoplasm, unspecified: Secondary | ICD-10-CM

## 2021-07-21 HISTORY — DX: Malignant (primary) neoplasm, unspecified: C80.1

## 2021-07-23 ENCOUNTER — Ambulatory Visit (HOSPITAL_COMMUNITY): Payer: BC Managed Care – PPO

## 2021-07-26 ENCOUNTER — Inpatient Hospital Stay: Payer: BC Managed Care – PPO | Attending: Hematology and Oncology

## 2021-07-26 ENCOUNTER — Inpatient Hospital Stay: Payer: BC Managed Care – PPO

## 2021-07-26 ENCOUNTER — Other Ambulatory Visit: Payer: Self-pay

## 2021-07-26 DIAGNOSIS — Z1509 Genetic susceptibility to other malignant neoplasm: Secondary | ICD-10-CM | POA: Insufficient documentation

## 2021-07-26 DIAGNOSIS — C9210 Chronic myeloid leukemia, BCR/ABL-positive, not having achieved remission: Secondary | ICD-10-CM | POA: Insufficient documentation

## 2021-07-26 DIAGNOSIS — R197 Diarrhea, unspecified: Secondary | ICD-10-CM | POA: Diagnosis not present

## 2021-07-26 DIAGNOSIS — Z90710 Acquired absence of both cervix and uterus: Secondary | ICD-10-CM | POA: Diagnosis not present

## 2021-07-26 LAB — CBC WITH DIFFERENTIAL/PLATELET
Abs Immature Granulocytes: 0.01 10*3/uL (ref 0.00–0.07)
Basophils Absolute: 0.1 10*3/uL (ref 0.0–0.1)
Basophils Relative: 1 %
Eosinophils Absolute: 0.2 10*3/uL (ref 0.0–0.5)
Eosinophils Relative: 3 %
HCT: 34.3 % — ABNORMAL LOW (ref 36.0–46.0)
Hemoglobin: 12 g/dL (ref 12.0–15.0)
Immature Granulocytes: 0 %
Lymphocytes Relative: 37 %
Lymphs Abs: 2.7 10*3/uL (ref 0.7–4.0)
MCH: 33.8 pg (ref 26.0–34.0)
MCHC: 35 g/dL (ref 30.0–36.0)
MCV: 96.6 fL (ref 80.0–100.0)
Monocytes Absolute: 0.3 10*3/uL (ref 0.1–1.0)
Monocytes Relative: 5 %
Neutro Abs: 3.8 10*3/uL (ref 1.7–7.7)
Neutrophils Relative %: 54 %
Platelets: 239 10*3/uL (ref 150–400)
RBC: 3.55 MIL/uL — ABNORMAL LOW (ref 3.87–5.11)
RDW: 12.3 % (ref 11.5–15.5)
WBC: 7.1 10*3/uL (ref 4.0–10.5)
nRBC: 0 % (ref 0.0–0.2)

## 2021-07-26 LAB — COMPREHENSIVE METABOLIC PANEL
ALT: 21 U/L (ref 0–44)
AST: 23 U/L (ref 15–41)
Albumin: 4.1 g/dL (ref 3.5–5.0)
Alkaline Phosphatase: 137 U/L — ABNORMAL HIGH (ref 38–126)
Anion gap: 5 (ref 5–15)
BUN: 16 mg/dL (ref 6–20)
CO2: 27 mmol/L (ref 22–32)
Calcium: 8.6 mg/dL — ABNORMAL LOW (ref 8.9–10.3)
Chloride: 108 mmol/L (ref 98–111)
Creatinine, Ser: 1.14 mg/dL — ABNORMAL HIGH (ref 0.44–1.00)
GFR, Estimated: 58 mL/min — ABNORMAL LOW (ref >60)
Glucose, Bld: 97 mg/dL (ref 70–99)
Potassium: 4 mmol/L (ref 3.5–5.1)
Sodium: 140 mmol/L (ref 135–145)
Total Bilirubin: 0.3 mg/dL (ref 0.3–1.2)
Total Protein: 6.3 g/dL — ABNORMAL LOW (ref 6.5–8.1)

## 2021-08-01 LAB — BCR/ABL

## 2021-08-02 ENCOUNTER — Ambulatory Visit (HOSPITAL_COMMUNITY)
Admission: RE | Admit: 2021-08-02 | Discharge: 2021-08-02 | Disposition: A | Discharge status: Home / Self Care | Payer: BC Managed Care – PPO | Source: Ambulatory Visit | Attending: Hematology and Oncology | Admitting: Hematology and Oncology

## 2021-08-02 ENCOUNTER — Other Ambulatory Visit: Payer: Self-pay

## 2021-08-02 DIAGNOSIS — C9210 Chronic myeloid leukemia, BCR/ABL-positive, not having achieved remission: Secondary | ICD-10-CM | POA: Diagnosis present

## 2021-08-02 IMAGING — MR MR BREAST BILAT WO/W CM
6 of 9 series · 29 of 48 positions shown · IV contrast (gadavist)
Comparison: Previous exam(s).

CLINICAL DATA: Patient with elevated lifetime risk of breast
cancer.

EXAM:
BILATERAL BREAST MRI WITH AND WITHOUT CONTRAST
TECHNIQUE: Multiplanar, multisequence MR images of both breasts were obtained
prior to and following the intravenous administration of 7 ml of
Gadavist

[Series 2: T2 · axial · 3.0mm · 0.91mm/px · 1 of 64 slices shown]
[im 1/64]
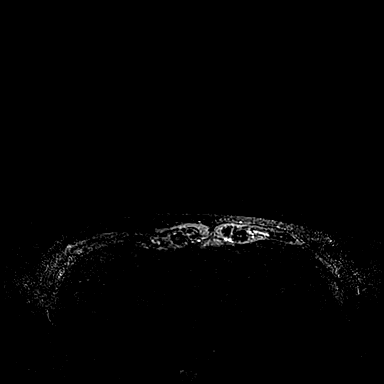

[Series 3: T1 fat-sat · axial · 1.2mm · 0.78mm/px · z∈[-140,+31]mm · 5 of 144 slices shown (1 of 4)]
[im 1/144]
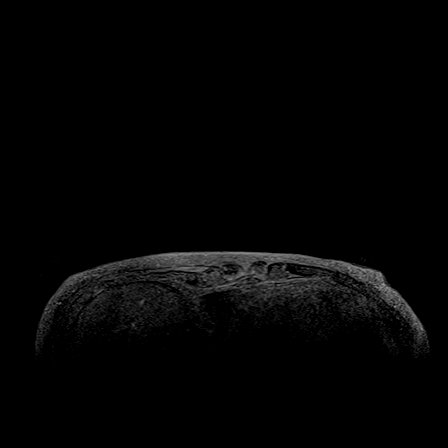
[im 36/144]
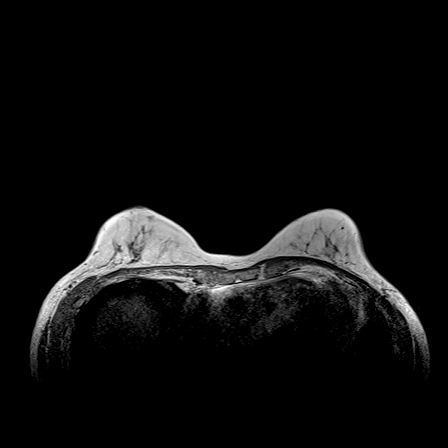
[im 72/144]
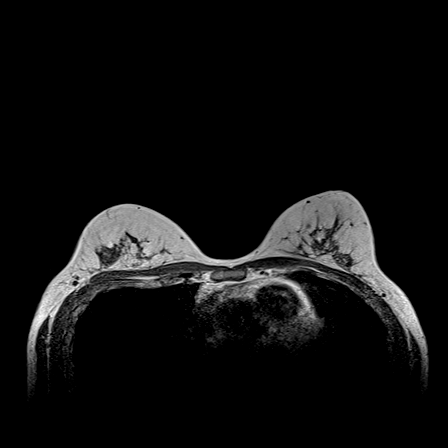
[im 108/144]
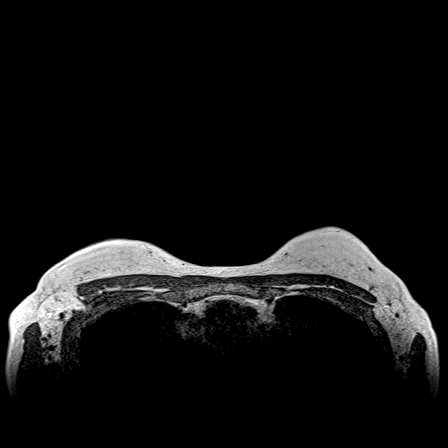
[im 144/144]
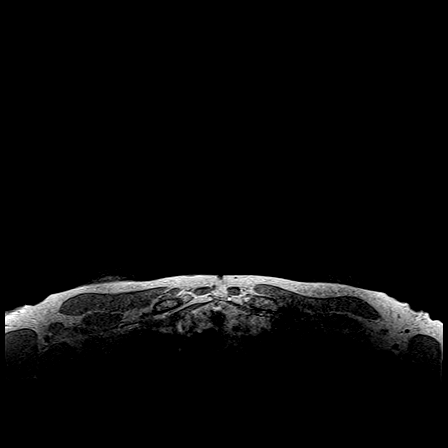

[Series 6: T1 fat-sat · axial · 1.2mm · 0.84mm/px · z∈[-147,+43]mm · 6 of 160 slices shown (2 of 4)]
[im 1/160]
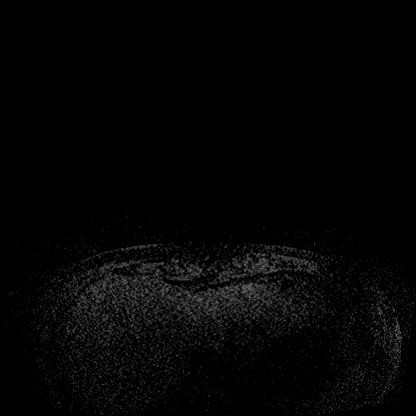
[im 32/160]
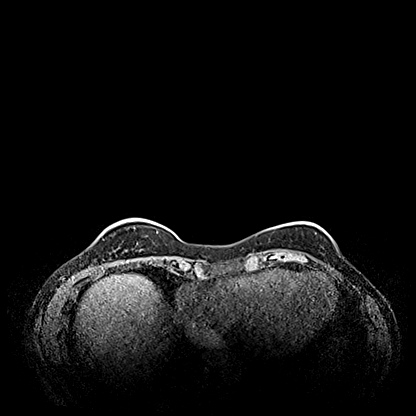
[im 64/160]
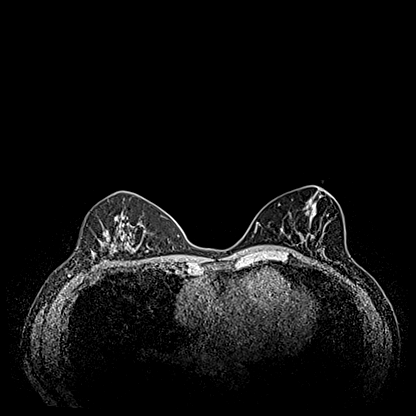
[im 96/160]
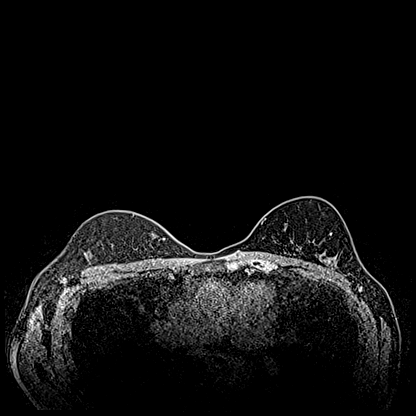
[im 128/160]
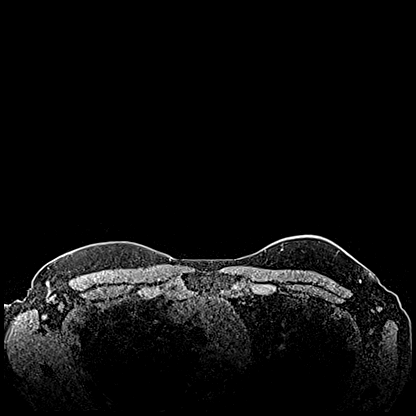
[im 160/160]
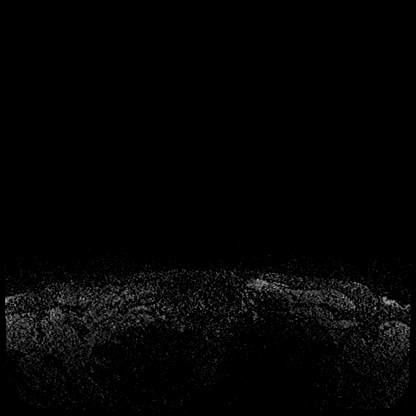

[Series 7: T1 fat-sat · axial · 1.2mm · 0.84mm/px · z∈[-147,+43]mm · 6 of 160 slices shown (3 of 4)]
[im 1/160]
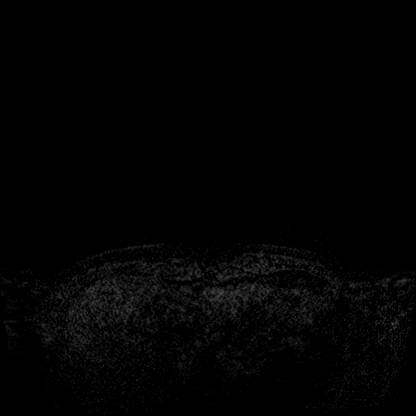
[im 32/160]
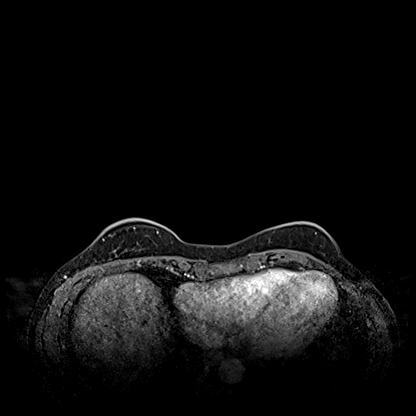
[im 64/160]
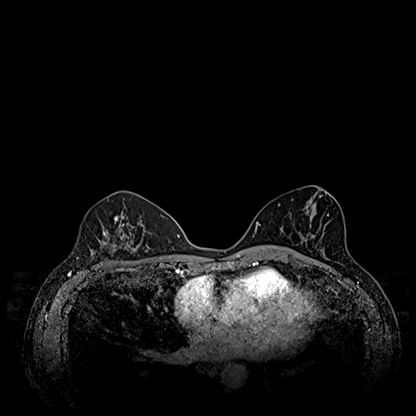
[im 96/160]
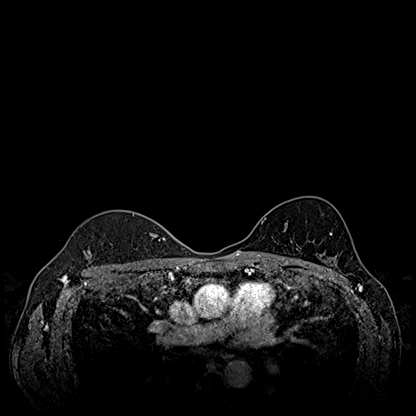
[im 128/160]
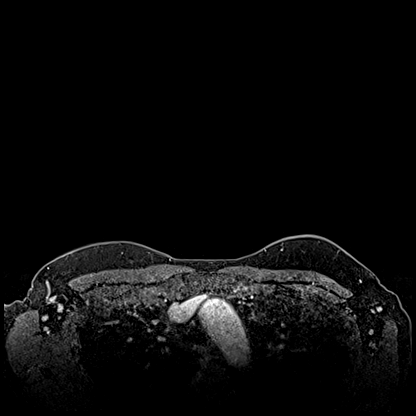
[im 160/160]
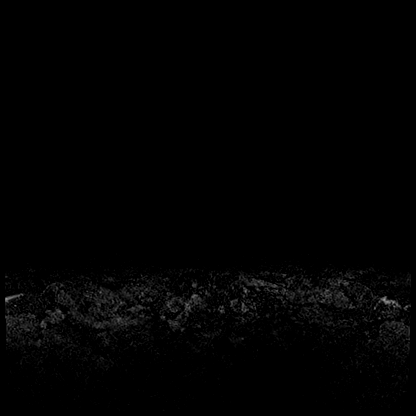

[Series 8: T1 · axial · 1.2mm · 0.84mm/px · z∈[-147,+43]mm · 6 of 160 slices shown]
[im 1/160]
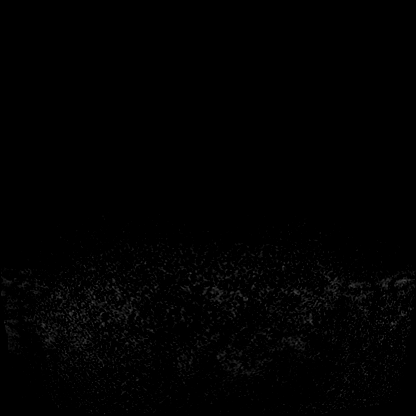
[im 32/160]
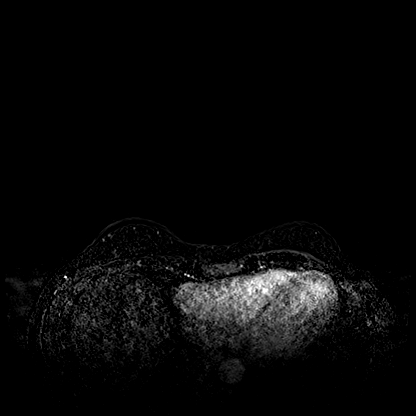
[im 64/160]
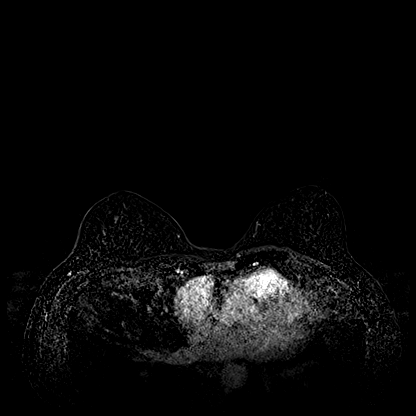
[im 96/160]
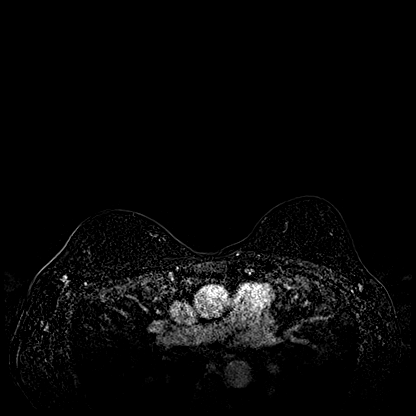
[im 128/160]
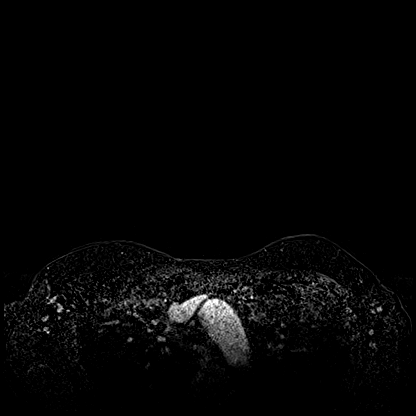
[im 160/160]
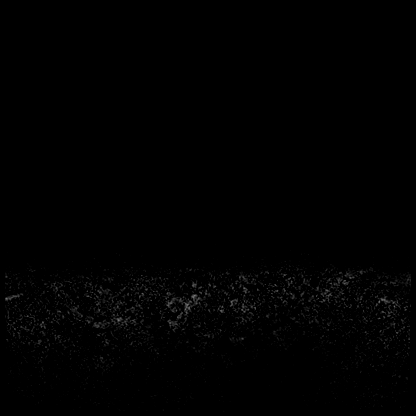

[Series 11: T1 fat-sat · axial · 1.2mm · 0.84mm/px · z∈[-147,+5]mm · 5 of 160 slices shown (4 of 4)]
[im 1/160]
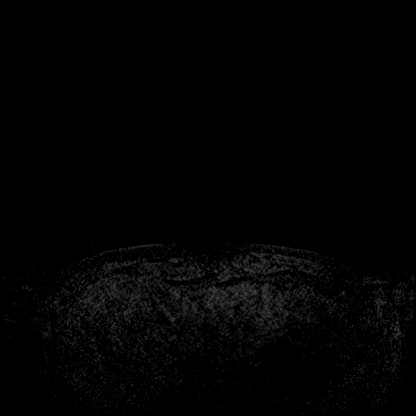
[im 32/160]
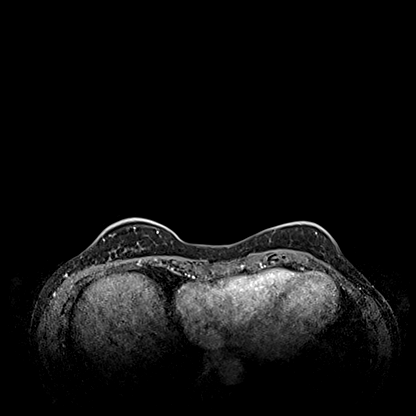
[im 64/160]
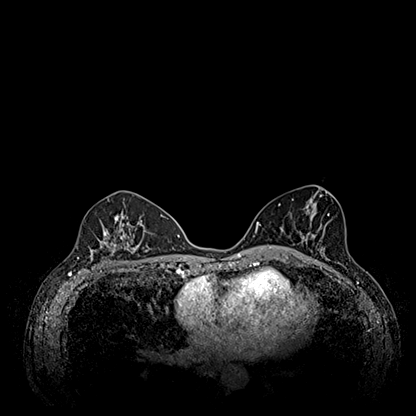
[im 96/160]
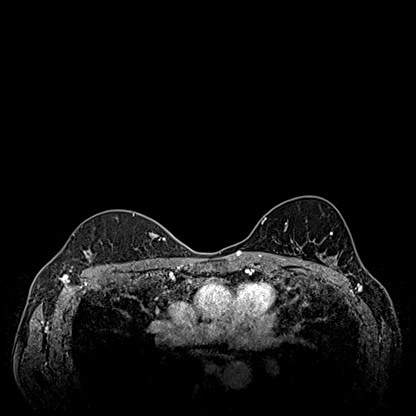
[im 128/160]
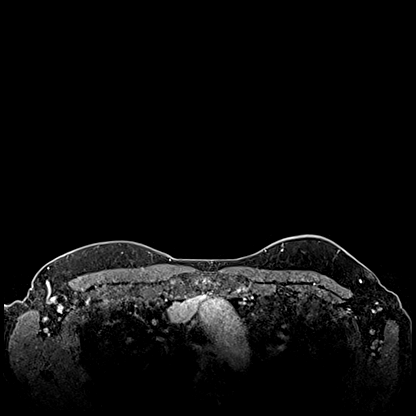

[29 of 48 positions shown; findings below may reference images not displayed]

Three-dimensional MR images were rendered by post-processing of the
original MR data on an independent workstation. The
three-dimensional MR images were interpreted, and findings are
reported in the following complete MRI report for this study. Three
dimensional images were evaluated at the independent interpreting
workstation using the DynaCAD thin client.
FINDINGS: Breast composition: c. Heterogeneous fibroglandular tissue.

Background parenchymal enhancement: Mild

Right breast: No mass or abnormal enhancement

Left breast: Within the upper inner left breast middle depth there
is a 6 mm irregular enhancing mass (image 76; series 12). Within the
posterolateral left breast there is a 8 mm focal area of non mass
enhancement (image 91; series 12). No additional suspicious areas of
enhancement identified within the left breast.

Lymph nodes: No abnormal appearing lymph nodes.

Ancillary findings: Prominent left axillary lymph node measuring 6
mm (image 20; series 2).
IMPRESSION: 1. There are 2 indeterminate areas of enhancement within the left
breast as described above.
2. Prominent left axillary lymph node.

RECOMMENDATION:
1. Second-look ultrasound of the left axilla to evaluate for
axillary adenopathy.
2. MRI guided core needle biopsy of both sites of indeterminate
enhancement within the left breast.

BI-RADS CATEGORY  4: Suspicious.

## 2021-08-02 MED ORDER — GADOBUTROL 1 MMOL/ML IV SOLN
7.0000 mL | Freq: Once | INTRAVENOUS | Status: AC | PRN
  Administered 2021-08-02: 7 mL via INTRAVENOUS

## 2021-08-05 ENCOUNTER — Other Ambulatory Visit: Payer: Self-pay

## 2021-08-05 ENCOUNTER — Telehealth: Payer: Self-pay

## 2021-08-05 ENCOUNTER — Inpatient Hospital Stay: Payer: BC Managed Care – PPO | Admitting: Hematology and Oncology

## 2021-08-05 ENCOUNTER — Other Ambulatory Visit (HOSPITAL_COMMUNITY): Payer: Self-pay

## 2021-08-05 ENCOUNTER — Encounter: Payer: Self-pay | Admitting: Hematology and Oncology

## 2021-08-05 DIAGNOSIS — Z1501 Genetic susceptibility to malignant neoplasm of breast: Secondary | ICD-10-CM | POA: Diagnosis not present

## 2021-08-05 DIAGNOSIS — Z1509 Genetic susceptibility to other malignant neoplasm: Secondary | ICD-10-CM

## 2021-08-05 DIAGNOSIS — R197 Diarrhea, unspecified: Secondary | ICD-10-CM

## 2021-08-05 DIAGNOSIS — Z1589 Genetic susceptibility to other disease: Secondary | ICD-10-CM

## 2021-08-05 DIAGNOSIS — Z1502 Genetic susceptibility to malignant neoplasm of ovary: Secondary | ICD-10-CM

## 2021-08-05 DIAGNOSIS — C9210 Chronic myeloid leukemia, BCR/ABL-positive, not having achieved remission: Secondary | ICD-10-CM | POA: Diagnosis not present

## 2021-08-05 MED ORDER — DASATINIB 70 MG PO TABS
70.0000 mg | ORAL_TABLET | Freq: Every day | ORAL | 11 refills | Status: DC
  Filled 2021-08-05: qty 30, 30d supply, fill #0
  Filled 2021-08-07: qty 15, 15d supply, fill #0
  Filled 2021-08-16: qty 15, 15d supply, fill #1
  Filled 2021-09-02: qty 15, 15d supply, fill #2
  Filled 2021-09-23 – 2021-09-24 (×2): qty 15, 15d supply, fill #3
  Filled 2021-10-09 – 2021-10-14 (×2): qty 15, 15d supply, fill #4
  Filled 2021-10-21 (×2): qty 15, 15d supply, fill #5
  Filled 2021-11-05: qty 15, 15d supply, fill #6
  Filled 2021-11-14: qty 15, 15d supply, fill #7
  Filled 2021-12-11: qty 15, 15d supply, fill #8
  Filled 2021-12-18: qty 15, 15d supply, fill #9
  Filled 2022-01-01: qty 15, 15d supply, fill #10
  Filled 2022-01-20: qty 15, 15d supply, fill #11
  Filled 2022-01-31: qty 15, 15d supply, fill #12
  Filled 2022-02-13: qty 15, 15d supply, fill #13
  Filled 2022-03-13: qty 30, 30d supply, fill #14
  Filled 2022-04-01: qty 15, 15d supply, fill #15
  Filled 2022-04-16: qty 15, 15d supply, fill #16

## 2021-08-05 NOTE — Telephone Encounter (Signed)
Oral Oncology Patient Advocate Encounter  After completing a benefits investigation, prior authorization for Sprycel is not required at this time through [facility].  Patient's copay is $[id number]/#[id number].    [place] Patient [place] Phone [phone] Fax [phone] 08/05/2021 11:35 AM

## 2021-08-05 NOTE — Progress Notes (Signed)
[facility] OFFICE PROGRESS NOTE  Patient Care Team: Merrilee Seashore, MD as PCP - General (Internal Medicine)  ASSESSMENT & PLAN:  CML (chronic myeloid leukemia) ([place]) She has been placed on [place] for the last 3 months Unfortunately, she has not reached the desired milestone as BCR/ABL is detected, greater than 30% The patient has been compliant taking medications as directed I expressed my concern about risk of mutation/resistance We discussed the risk and benefits of switching her over to Sprycel We can start her at a lower dose at 70 mg daily Reason why we did not start her on Sprycel at the beginning is due to cost and financial situation I will try to get assistance from my pharmacy team to see if we can get co-pay assistance to help switch her treatment to Sprycel In the meantime, she will complete her current prescribed Gleevec I will see her in a month for further follow-up  Diarrhea She continues to have frequent diarrhea She will take Imodium as needed  Monoallelic mutation of CHEK2 gene in female patient We reviewed the implication of genetic mutation that she had MRI of the breast is pending The patient has complete hysterectomy We discussed the risk and benefits of long-term chemoprevention with tamoxifen Alternatively, we can also try aromatase inhibitor We also discussed the risk and benefits of bilateral mastectomy Ultimately, she is undecided We will resume this discussion in her next visit I will call her once MRI result is available  No orders of the defined types were placed in this encounter.   All questions were answered. The patient knows to call the clinic with any problems, questions or concerns. The total time spent in the appointment was 40 minutes encounter with patients including review of chart and various tests results, discussions about plan of care and coordination of care plan   [name], MD 08/05/2021 11:12  AM  INTERVAL HISTORY: Please see below for problem oriented charting. she returns for treatment follow-up on imatinib for CML Since last time I saw her, she only missed 1 dose but overall, she typically takes it with dinnertime She has frequent diarrhea and she takes Imodium on a regular basis She has not been taking cholestyramine No recent infection, fever or chills She had numerous questions related to chemoprevention due to positive testing for CHEK2 mutation She underwent MRI of the breast recently and of last week, results are pending She is coping well since last time I saw her although she does have some mood swing fluctuation  REVIEW OF SYSTEMS:   Constitutional: Denies fevers, chills or abnormal weight loss Eyes: Denies blurriness of vision Ears, nose, mouth, throat, and face: Denies mucositis or sore throat Respiratory: Denies cough, dyspnea or wheezes Cardiovascular: Denies palpitation, chest discomfort or lower extremity swelling Skin: Denies abnormal skin rashes Lymphatics: Denies new lymphadenopathy or easy bruising Neurological:Denies numbness, tingling or new weaknesses Behavioral/Psych: Mood is stable, no new changes  All other systems were reviewed with the patient and are negative.  I have reviewed the past medical history, past surgical history, social history and family history with the patient and they are unchanged from previous note.  ALLERGIES:  is allergic to other, sulfa antibiotics, wellbutrin [bupropion], and piperacillin sod-tazobactam so.  MEDICATIONS:  Current Outpatient Medications  Medication Sig Dispense Refill   dasatinib (SPRYCEL) 70 MG tablet Take 1 tablet (70 mg total) by mouth daily. 30 tablet 11   acetaminophen (TYLENOL) 325 MG tablet Take 325 mg by mouth every  6 (six) hours as needed (pain).      cholestyramine (QUESTRAN) 4 GM/DOSE powder Take 4 g by mouth daily.     clonazePAM (KLONOPIN) 0.5 MG tablet Take 0.5 mg by mouth 2 (two) times  daily as needed for anxiety.     escitalopram (LEXAPRO) 20 MG tablet Take 30 mg by mouth daily.     ibuprofen (ADVIL) 200 MG tablet Take 200 mg by mouth every 6 (six) hours as needed for moderate pain.      Lactobacillus (PROBIOTIC ACIDOPHILUS PO) Take 1 capsule by mouth daily.     loperamide (IMODIUM) 2 MG capsule Take 2 mg by mouth daily as needed for diarrhea or loose stools.     metaxalone (SKELAXIN) 400 MG tablet Take 1 tablet (400 mg total) by mouth 3 (three) times daily as needed for muscle spasms. 15 tablet 0   Multiple Vitamin (MULTIVITAMIN WITH MINERALS) TABS tablet Take 1 tablet by mouth daily.     ondansetron (ZOFRAN) 4 MG tablet Take 1 tablet (4 mg total) by mouth every 8 (eight) hours as needed for nausea or vomiting. 30 tablet 0   No current facility-administered medications for this visit.    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[place])  03/01/2021 Initial Diagnosis   CML (chronic myeloid leukemia) ([place])   03/01/2021 Pathology Results   BCR/ABL by FISH: 91% positive BRC/ABL by PCR: e13a2 (b2a2) by IS is 196.685%   04/25/2021 -  Chemotherapy   She started taking imatinib   05/15/2021 Pathology Results   BRC/ABL is detected by PCR: e13a2 (b2a2) by IS is 70.731%   06/17/2021 Pathology Results   e13a2 (b2a2) by Quantidex (IS): 39.7401%   07/24/2021 Pathology Results   e13a2 (b2a2) by Quantidex (IS): 33.3%     PHYSICAL EXAMINATION: ECOG PERFORMANCE STATUS: 1 - Symptomatic but completely ambulatory  Vitals:   08/05/21 1016  BP: 106/73  Pulse: 73  Resp: 18  Temp: 98.1 F (36.7 C)  SpO2: 98%   Filed Weights   08/05/21 1016  Weight: 142 lb 6.4 oz (64.6 kg)    GENERAL:alert, no distress and comfortable NEURO: alert & oriented x 3 with fluent speech, no focal motor/sensory deficits  LABORATORY DATA:  I have reviewed the data as listed    Component Value Date/Time   NA 140 07/26/2021 1518   K 4.0 07/26/2021 1518   CL  108 07/26/2021 1518   CO2 27 07/26/2021 1518   GLUCOSE 97 07/26/2021 1518   BUN 16 07/26/2021 1518   CREATININE 1.14 (H) 07/26/2021 1518   CALCIUM 8.6 (L) 07/26/2021 1518   PROT 6.3 (L) 07/26/2021 1518   ALBUMIN 4.1 07/26/2021 1518   AST 23 07/26/2021 1518   ALT 21 07/26/2021 1518   ALKPHOS 137 (H) 07/26/2021 1518   BILITOT 0.3 07/26/2021 1518   GFRNONAA 58 (L) 07/26/2021 1518   GFRAA >60 10/21/2019 1020    No results found for: SPEP, UPEP  Lab Results  Component Value Date   WBC 7.1 07/26/2021   NEUTROABS 3.8 07/26/2021   HGB 12.0 07/26/2021   HCT 34.3 (L) 07/26/2021   MCV 96.6 07/26/2021   PLT 239 07/26/2021      Chemistry      Component Value Date/Time   NA 140 07/26/2021 1518   K 4.0 07/26/2021 1518   CL 108 07/26/2021 1518   CO2 27 07/26/2021 1518   BUN 16 07/26/2021 1518   CREATININE 1.14 (H) 07/26/2021 1518  Component Value Date/Time   CALCIUM 8.6 (L) 07/26/2021 1518   ALKPHOS 137 (H) 07/26/2021 1518   AST 23 07/26/2021 1518   ALT 21 07/26/2021 1518   BILITOT 0.3 07/26/2021 1518

## 2021-08-05 NOTE — Assessment & Plan Note (Signed)
She has been placed on Gleevec for the last 3 months Unfortunately, she has not reached the desired milestone as BCR/ABL is detected, greater than 30% The patient has been compliant taking medications as directed I expressed my concern about risk of mutation/resistance We discussed the risk and benefits of switching her over to Sprycel We can start her at a lower dose at 70 mg daily Reason why we did not start her on Sprycel at the beginning is due to cost and financial situation I will try to get assistance from my pharmacy team to see if we can get co-pay assistance to help switch her treatment to Sprycel In the meantime, she will complete her current prescribed [place] I will see her in a month for further follow-up

## 2021-08-05 NOTE — Assessment & Plan Note (Signed)
We reviewed the implication of genetic mutation that she had MRI of the breast is pending The patient has complete hysterectomy We discussed the risk and benefits of long-term chemoprevention with tamoxifen Alternatively, we can also try aromatase inhibitor We also discussed the risk and benefits of bilateral mastectomy Ultimately, she is undecided We will resume this discussion in her next visit I will call her once MRI result is available

## 2021-08-05 NOTE — Assessment & Plan Note (Signed)
She continues to have frequent diarrhea She will take Imodium as needed

## 2021-08-06 ENCOUNTER — Telehealth: Payer: Self-pay | Admitting: Hematology and Oncology

## 2021-08-06 ENCOUNTER — Other Ambulatory Visit: Payer: Self-pay | Admitting: Hematology and Oncology

## 2021-08-06 DIAGNOSIS — R928 Other abnormal and inconclusive findings on diagnostic imaging of breast: Secondary | ICD-10-CM

## 2021-08-06 NOTE — Telephone Encounter (Signed)
I have reviewed MRI breast report and spoke with the patient Per radiologist recommendation, I will order diagnostic ultrasound of the left axilla and MRI guided breast biopsy I have addressed all her questions

## 2021-08-07 ENCOUNTER — Other Ambulatory Visit (HOSPITAL_COMMUNITY): Payer: Self-pay

## 2021-08-07 ENCOUNTER — Other Ambulatory Visit: Payer: Self-pay | Admitting: Hematology and Oncology

## 2021-08-07 DIAGNOSIS — R928 Other abnormal and inconclusive findings on diagnostic imaging of breast: Secondary | ICD-10-CM

## 2021-08-08 ENCOUNTER — Other Ambulatory Visit: Payer: Self-pay | Admitting: Hematology and Oncology

## 2021-08-08 ENCOUNTER — Other Ambulatory Visit (HOSPITAL_COMMUNITY): Payer: Self-pay

## 2021-08-08 ENCOUNTER — Telehealth: Payer: Self-pay

## 2021-08-08 DIAGNOSIS — R928 Other abnormal and inconclusive findings on diagnostic imaging of breast: Secondary | ICD-10-CM

## 2021-08-08 NOTE — Telephone Encounter (Signed)
Attempted to call her. Mailbox full. Received a message from [facility] imaging that are trying to schedule her and her mailbox in full.

## 2021-08-08 NOTE — Telephone Encounter (Signed)
[name], can you add a note on her appt to add EKG?

## 2021-08-08 NOTE — Telephone Encounter (Signed)
Added note to appt note on 2/17 when she see you.

## 2021-08-08 NOTE — Telephone Encounter (Addendum)
Oral Chemotherapy Pharmacist Encounter  I spoke with patient for overview of: Sprycel for the treatment of chronic myeloid leukemia not responding to [place], planned duration until disease progression or unacceptable toxicity.  Counseled patient on administration, dosing, side effects, monitoring, drug-food interactions, safe handling, storage, and disposal.  Patient will take Sprycel 70 mg tablets, 1 tablet by mouth once daily without regard to food.  Patient instructed that administering with food may decrease GI irritation.   Patient knows to avoid grapefruit or grapefruit juice while on therapy with Sprycel.  Sprycel start date: 08/09/2021  Adverse effects include but are not limited to: fluid retention, nausea, diarrhea, rash, fatigue, dyspnea, hemorrhage, and thrombocytopenia.  Patient has anti-emetic on hand and knows to take it if nausea develops.   Patient will obtain anti diarrheal and alert the office of 4 or more loose stools above baseline.  Reviewed with patient importance of keeping a medication schedule and plan for any missed doses. No barriers to medication adherence identified.  Medication reconciliation performed and medication/allergy list updated. Patient will avoid taking acetaminophen while on the medication due to the drug interactions. Patient made aware that MD may get an EKG in the future with lexapro interaction to monitor for qtc prolongation.   Insurance authorization for Sprycel has been obtained. Test claim at the pharmacy revealed copayment $0 for 1st fill of 30 days. Patient able to use copay card to cover cost. Patient notified of $15,000 limit per year for sprycel. Patient will pick up medication on 08/08/2021 from [facility].   Patient informed the pharmacy will reach out 5-7 days prior to needing next fill of Sprycel to coordinate continued medication acquisition to prevent break in therapy.  All questions answered.  Mrs.  Newbern voiced understanding and appreciation.   Medication education handout placed in mail for patient. Patient knows to call the office with questions or concerns. Oral Chemotherapy Clinic phone number provided to patient.   Drema Halon, PharmD Hematology/Oncology Clinical Pharmacist [facility] [phone] 08/08/2021  9:22 AM

## 2021-08-09 ENCOUNTER — Other Ambulatory Visit (HOSPITAL_COMMUNITY): Payer: Self-pay

## 2021-08-09 ENCOUNTER — Other Ambulatory Visit: Payer: Self-pay | Admitting: Hematology and Oncology

## 2021-08-09 MED ORDER — LORAZEPAM 1 MG PO TABS
1.0000 mg | ORAL_TABLET | Freq: Two times a day (BID) | ORAL | 0 refills | Status: AC | PRN
Start: 2021-08-09 — End: ?
  Filled 2021-08-09: qty 10, 5d supply, fill #0

## 2021-08-12 ENCOUNTER — Other Ambulatory Visit: Payer: Self-pay

## 2021-08-12 ENCOUNTER — Ambulatory Visit
Admission: RE | Admit: 2021-08-12 | Discharge: 2021-08-12 | Disposition: A | Discharge status: Home / Self Care | Payer: BC Managed Care – PPO | Source: Ambulatory Visit | Attending: Hematology and Oncology | Admitting: Hematology and Oncology

## 2021-08-12 ENCOUNTER — Other Ambulatory Visit: Payer: Self-pay | Admitting: Diagnostic Radiology

## 2021-08-12 DIAGNOSIS — R928 Other abnormal and inconclusive findings on diagnostic imaging of breast: Secondary | ICD-10-CM

## 2021-08-12 IMAGING — MR MR BREAST BX W LOC DEV 1ST LESION IMAGE BX SPEC MR GUIDE*L*
8 of 12 series · 30 of 48 positions shown · IV contrast (7ml gadavist)
Comparison: Previous exams.
COMPARISON: Previous exams.

Addendum:
CLINICAL DATA: 45-year-old high risk female with indeterminate left
breast enhancement.

EXAM:
MRI GUIDED CORE NEEDLE BIOPSY OF THE LEFT BREAST
TECHNIQUE: Multiplanar, multisequence MR imaging of the left breast was
performed both before and after administration of intravenous
contrast.
CONTRAST:  7mL GADAVIST GADOBUTROL 1 MMOL/ML IV SOLN

[Series 2: fiducial unilateral · sagittal · 2.0mm · 1.33mm/px · 2 of 52 slices shown]
[im 1/52]
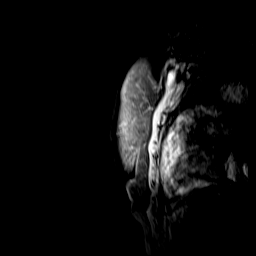
[im 52/52]
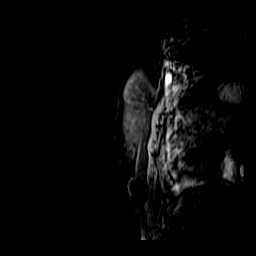

[Series 3: dynamic pre · axial · non-contrast · 1.3mm · 0.73mm/px · z∈[-104,+103]mm · 5 of 160 slices shown]
[im 1/160]
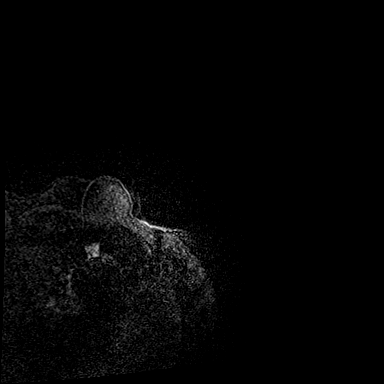
[im 40/160]
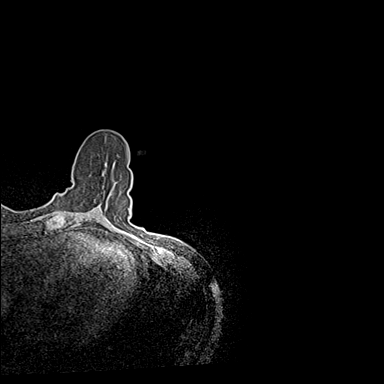
[im 80/160]
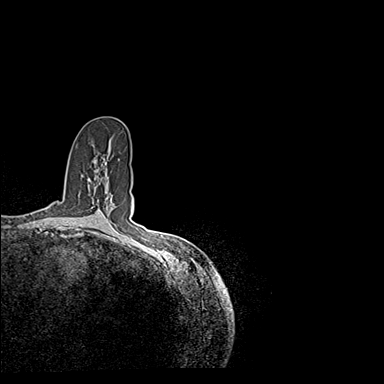
[im 120/160]
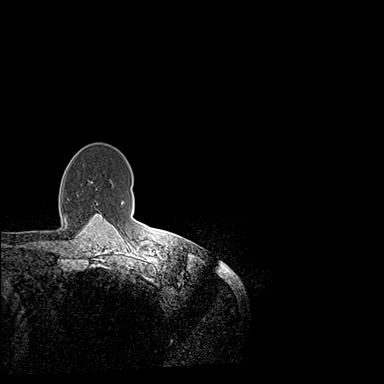
[im 160/160]
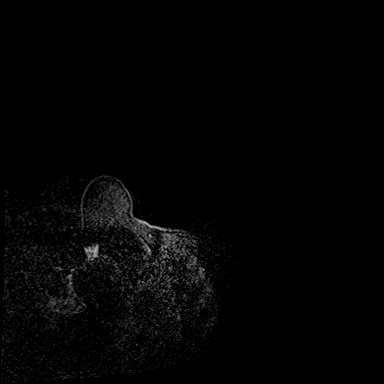

[Series 4: dynamic post 20 · axial · 1.3mm · 0.73mm/px · z∈[-104,+103]mm · 5 of 160 slices shown (1 of 2)]
[im 1/160]
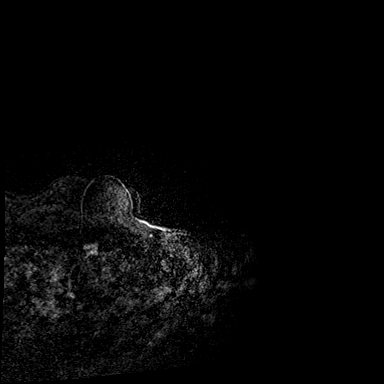
[im 40/160]
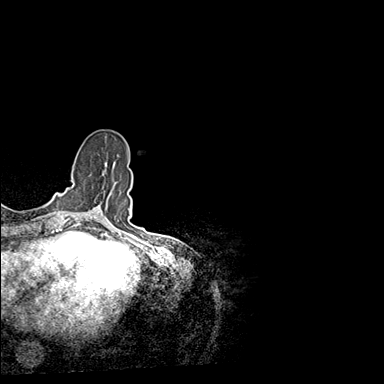
[im 80/160]
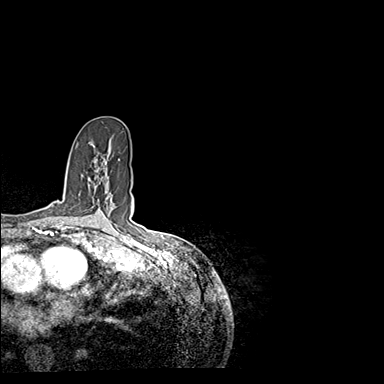
[im 120/160]
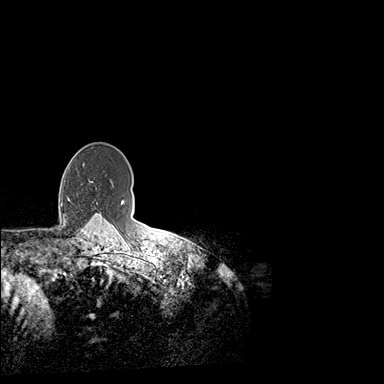
[im 160/160]
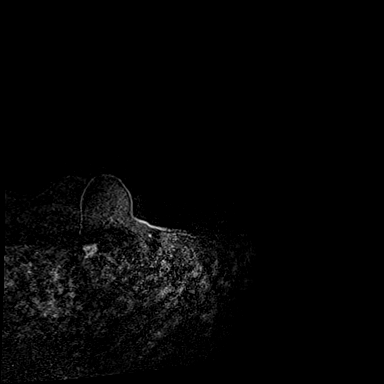

[Series 5: dynamic post 20 · axial · 1.3mm · 0.73mm/px · z∈[-104,+103]mm · 4 of 160 slices shown (2 of 2)]
[im 1/160]
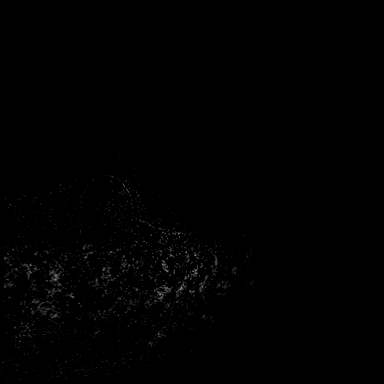
[im 54/160]
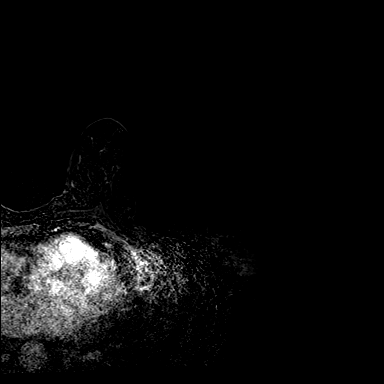
[im 107/160]
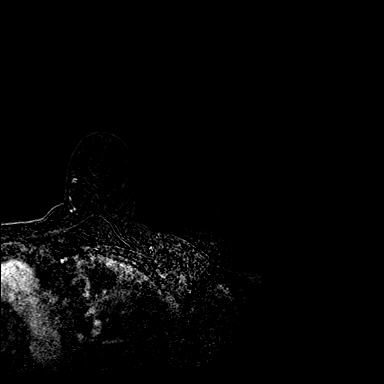
[im 160/160]
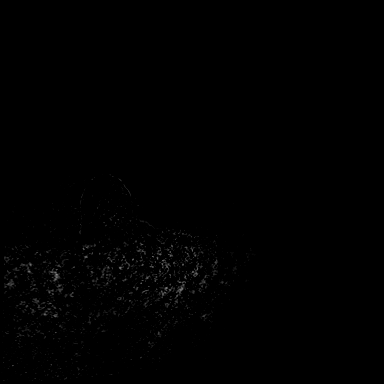

[Series 6: dynamic post 3 · axial · 1.3mm · 0.73mm/px · z∈[-104,+103]mm · 4 of 160 slices shown (1 of 2)]
[im 1/160]
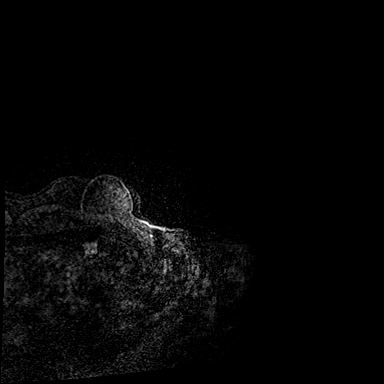
[im 54/160]
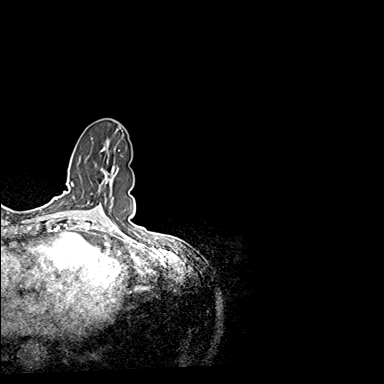
[im 107/160]
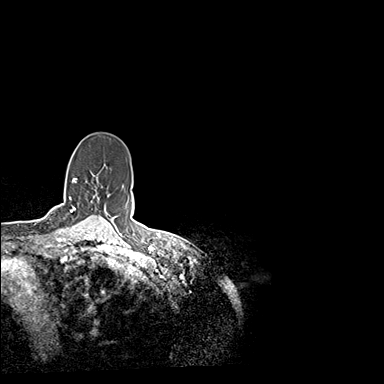
[im 160/160]
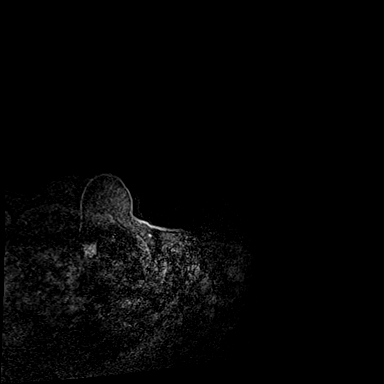

[Series 7: dynamic post 3 · axial · 1.3mm · 0.73mm/px · z∈[-104,+103]mm · 4 of 160 slices shown (2 of 2)]
[im 1/160]
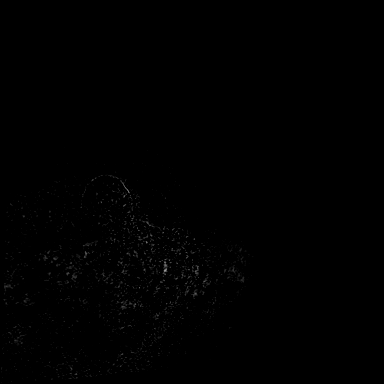
[im 54/160]
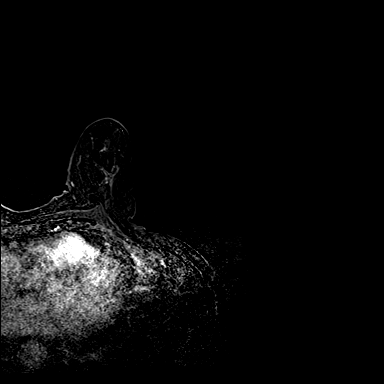
[im 107/160]
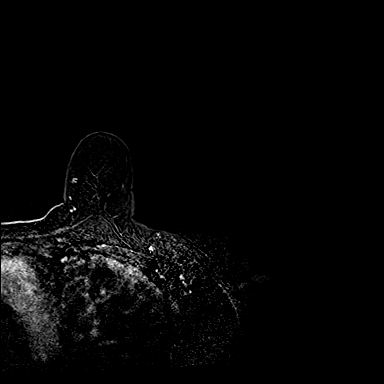
[im 160/160]
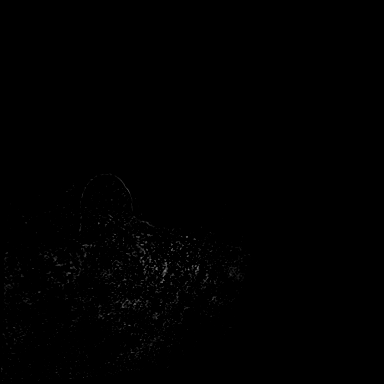

[Series 8: dynamic post 5 · axial · 1.3mm · 0.73mm/px · z∈[-104,+103]mm · 4 of 160 slices shown (1 of 2)]
[im 1/160]
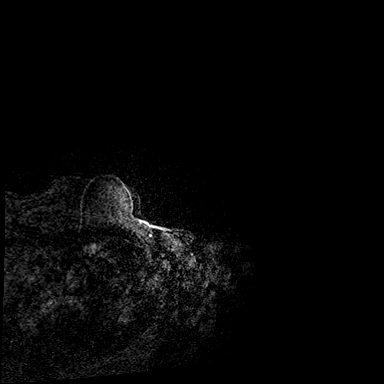
[im 54/160]
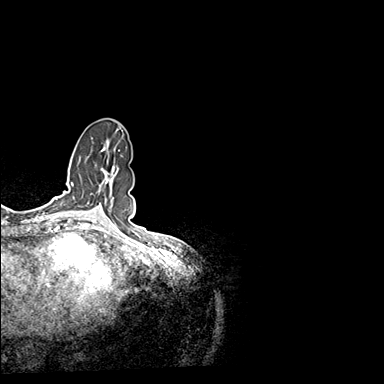
[im 107/160]
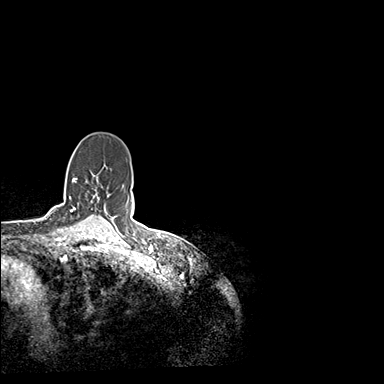
[im 160/160]
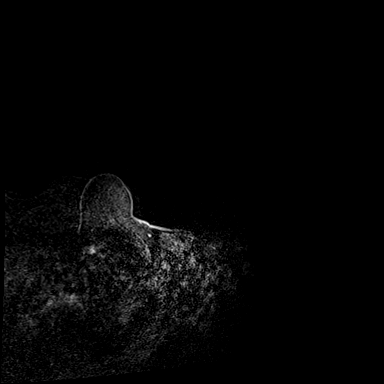

[Series 9: dynamic post 5 · axial · 1.3mm · 0.73mm/px · z∈[-104,-35]mm · 2 of 160 slices shown (2 of 2)]
[im 1/160]
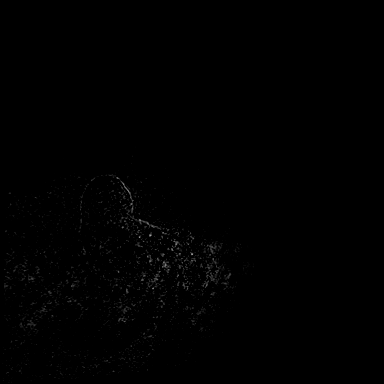
[im 54/160]
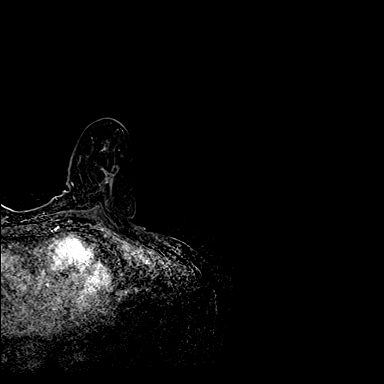

[30 of 48 positions shown; findings below may reference images not displayed]

FINDINGS: I met with the patient, and we discussed the procedure of MRI guided
biopsy, including risks, benefits, and alternatives. Specifically,
we discussed the risks of infection, bleeding, tissue injury, clip
migration, and inadequate sampling. Informed, written consent was
given. The usual time out protocol was performed immediately prior
to the procedure.

Preprocedural contrast enhanced evaluation was performed of the left
breast. The 8 mm focus of enhancement in the lateral, far posterior
left breast was identified and targeted for biopsy. The additional 6
mm enhancing mass in the upper inner left breast at mid depth could
not be identified on earlier late post-contrast images. Therefore,
only a single area biopsy of the lateral left breast was performed.

Using sterile technique, 1% Lidocaine, MRI guidance, and a 9 gauge
vacuum assisted device, biopsy was performed of an enhancing focus
in the posterolateral left breast using a lateral approach. At the
conclusion of the procedure, a barbell shaped tissue marker clip was
deployed into the biopsy cavity. Follow-up 2-view mammogram was
performed and dictated separately.
IMPRESSION: MRI guided biopsy of the left breast.  No apparent complications.

MRI guided biopsy of the 8 mm focus of enhancement in the
posterolateral left breast was performed. The additional 6 mm focus
of enhancement in the upper inner quadrant was not identified on
today's preprocedural images.

ADDENDUM:
Pathology revealed COMPLEX SCLEROSING LESION of the LEFT breast,
central lateral, posterior, (barbell clip). This was found to be
concordant by Dr. ISBELL, with excision recommended.

Pathology results were discussed with the patient by telephone. The
patient reported doing well after the biopsy with tenderness at the
site. Post biopsy instructions and care were reviewed and questions
were answered. The patient was encouraged to call The [REDACTED] for any additional concerns. My direct phone
number was provided.

Surgical consultation has been arranged with Dr. ISBELL at
[REDACTED] on [DATE].

The patient is scheduled for LEFT second-look ultrasound with
possible biopsy to evaluate for axillary adenopathy on [DATE]. Further recommendations will be guided by the results of this
examination.

The patient was instructed to return for a bilateral breast MRI in 6
months for the additional LEFT breast mass not seen at the time of
MRI guided biopsy on [DATE].

Pathology results reported by ISBELL, RN on [DATE].

*** End of Addendum ***
FINDINGS: I met with the patient, and we discussed the procedure of MRI guided
biopsy, including risks, benefits, and alternatives. Specifically,
we discussed the risks of infection, bleeding, tissue injury, clip
migration, and inadequate sampling. Informed, written consent was
given. The usual time out protocol was performed immediately prior
to the procedure.

Preprocedural contrast enhanced evaluation was performed of the left
breast. The 8 mm focus of enhancement in the lateral, far posterior
left breast was identified and targeted for biopsy. The additional 6
mm enhancing mass in the upper inner left breast at mid depth could
not be identified on earlier late post-contrast images. Therefore,
only a single area biopsy of the lateral left breast was performed.

Using sterile technique, 1% Lidocaine, MRI guidance, and a 9 gauge
vacuum assisted device, biopsy was performed of an enhancing focus
in the posterolateral left breast using a lateral approach. At the
conclusion of the procedure, a barbell shaped tissue marker clip was
deployed into the biopsy cavity. Follow-up 2-view mammogram was
performed and dictated separately.
IMPRESSION: MRI guided biopsy of the left breast.  No apparent complications.

MRI guided biopsy of the 8 mm focus of enhancement in the
posterolateral left breast was performed. The additional 6 mm focus
of enhancement in the upper inner quadrant was not identified on
today's preprocedural images.

## 2021-08-12 IMAGING — MG MM BREAST LOCALIZATION CLIP
4 series · 4 of 12 positions shown · non-contrast
Comparison: Previous exam(s).

CLINICAL DATA: Status post MRI guided biopsy.

EXAM:
3D DIAGNOSTIC LEFT MAMMOGRAM POST MRI BIOPSY

[L ML synth-2D]
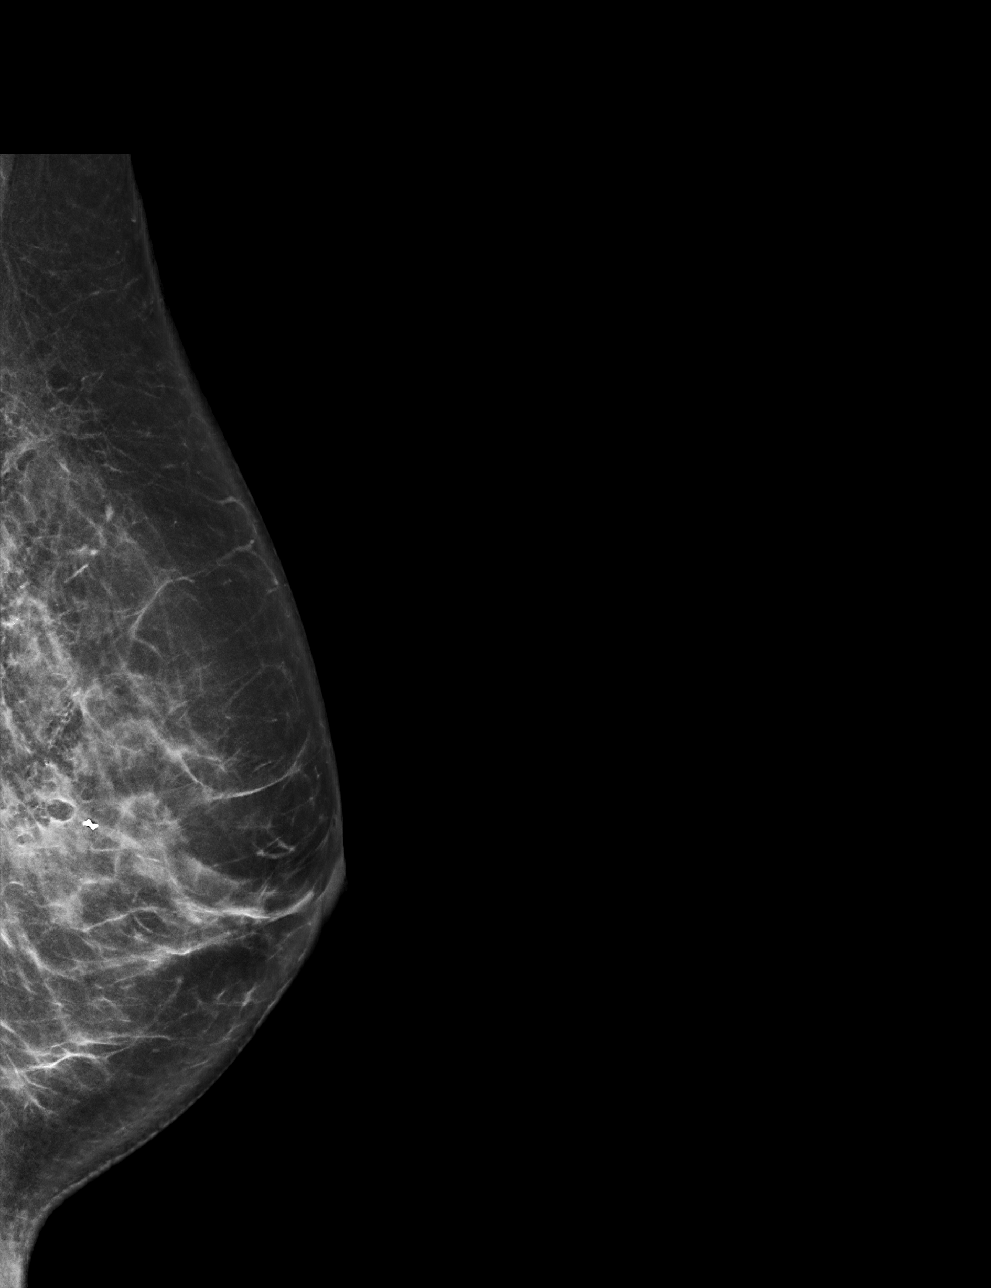

[L CC synth-2D]
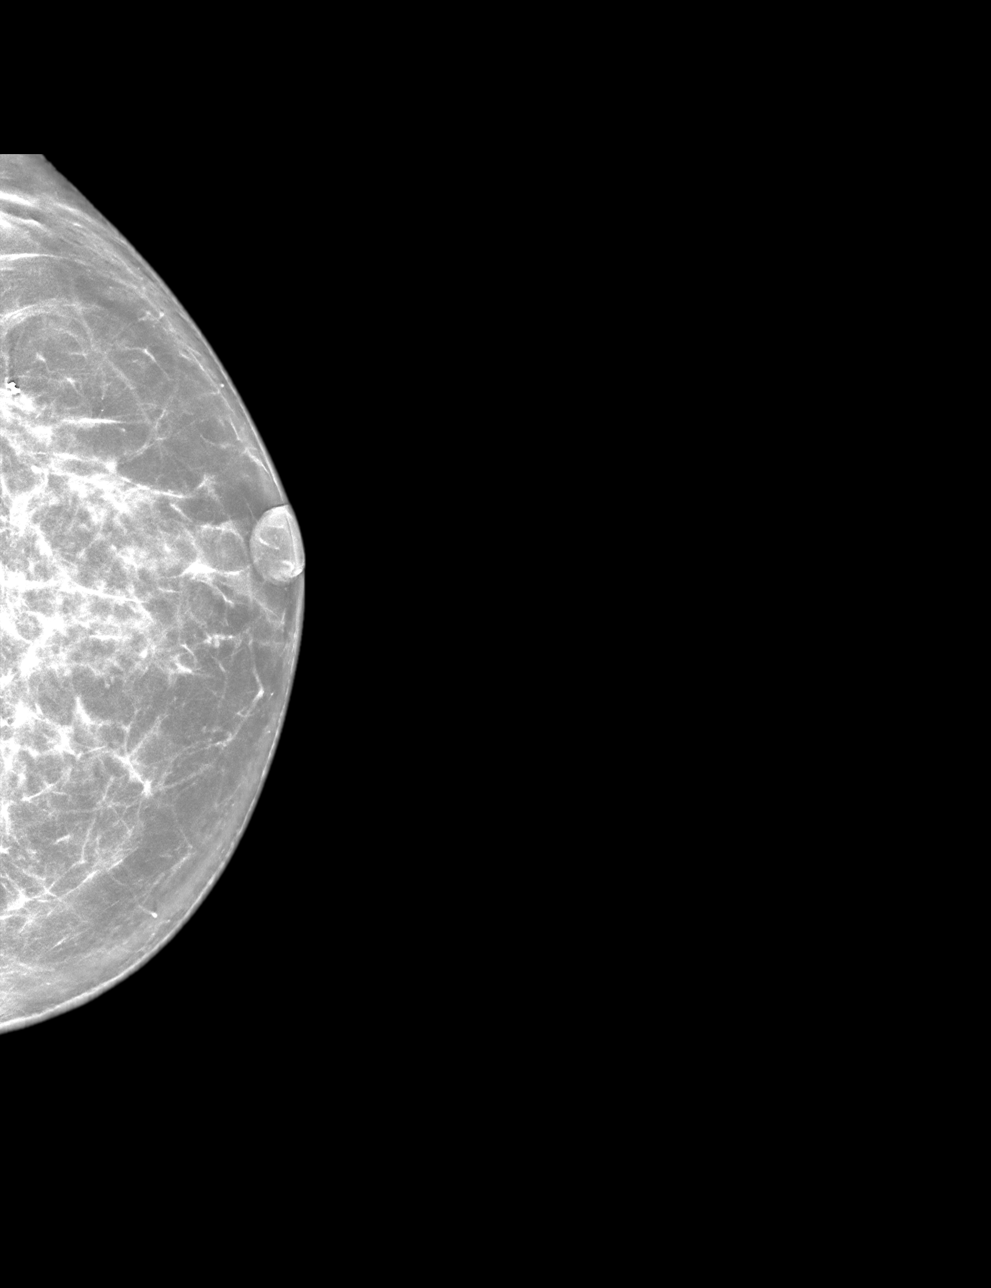

[L CC tomo · tomo slice 33/64.0]
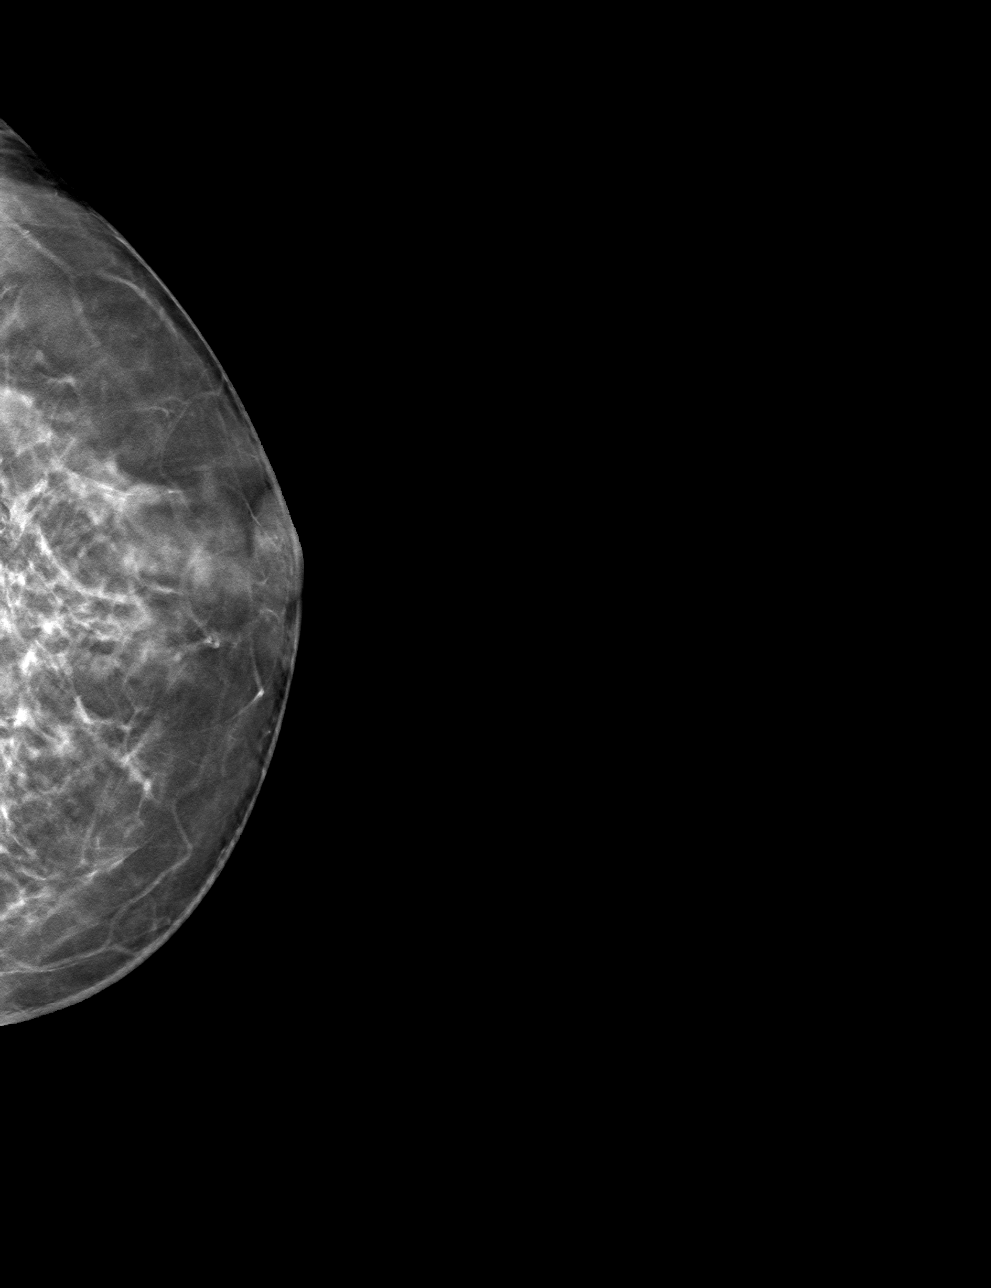

[L ML tomo · tomo slice 35/69.0]
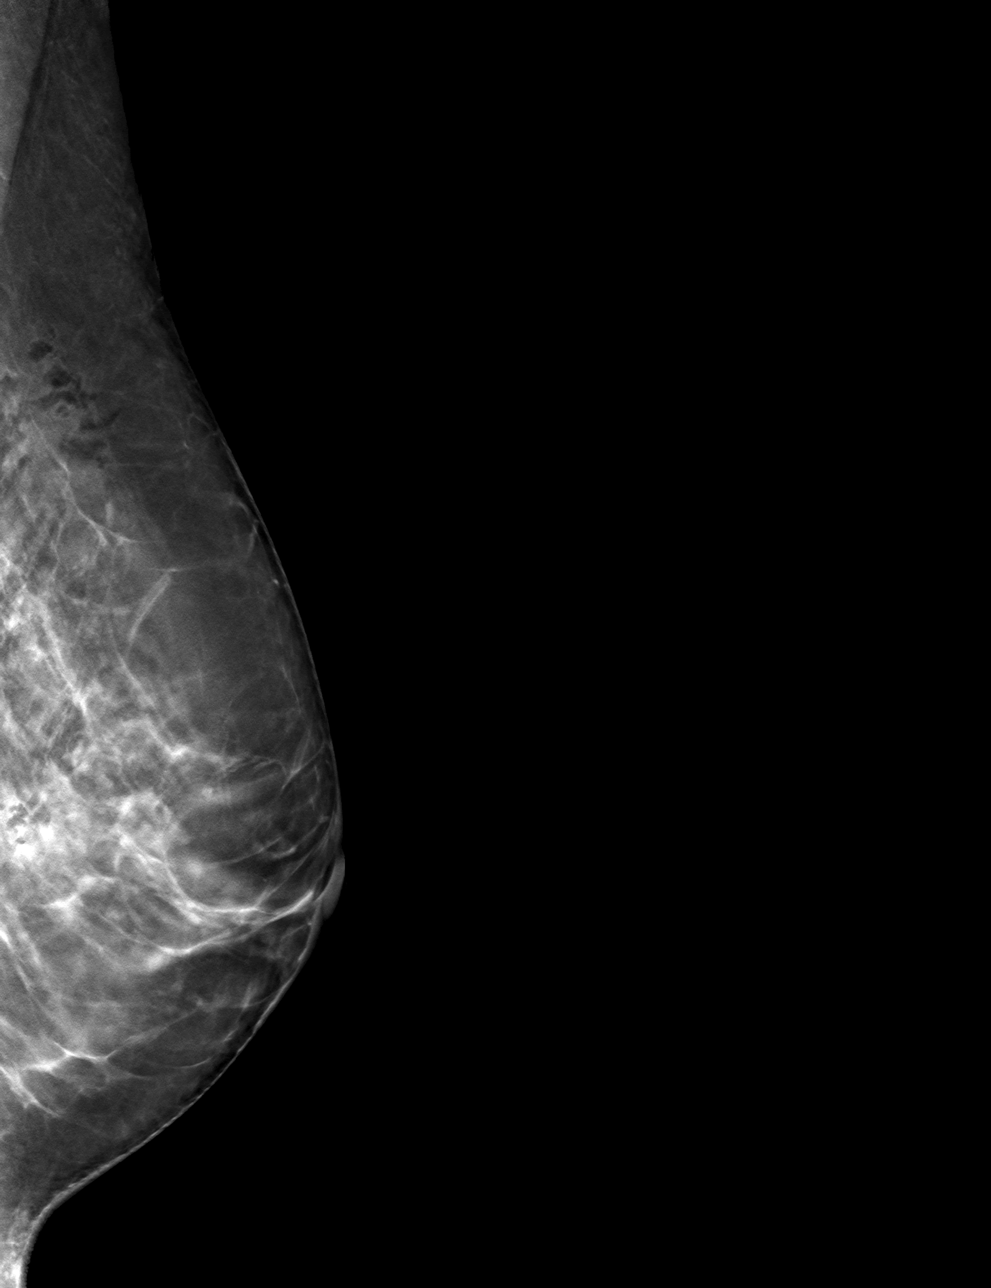

[4 of 12 positions shown; findings below may reference images not displayed]

FINDINGS: 3D Mammographic images were obtained following MRI guided biopsy of
the lateral left breast. The biopsy marking clip is in expected
position at the site of biopsy.
IMPRESSION: Appropriate positioning of the barbell shaped biopsy marking clip at
the site of biopsy in the posterolateral left breast.

Final Assessment: Post Procedure Mammograms for Marker Placement

## 2021-08-12 MED ORDER — GADOBUTROL 1 MMOL/ML IV SOLN
7.0000 mL | Freq: Once | INTRAVENOUS | Status: AC | PRN
  Administered 2021-08-12: 7 mL via INTRAVENOUS

## 2021-08-14 ENCOUNTER — Other Ambulatory Visit (HOSPITAL_COMMUNITY): Payer: Self-pay

## 2021-08-16 ENCOUNTER — Telehealth: Payer: Self-pay

## 2021-08-16 ENCOUNTER — Other Ambulatory Visit (HOSPITAL_COMMUNITY): Payer: Self-pay

## 2021-08-16 NOTE — Telephone Encounter (Signed)
Called and left a message asking her to call the office back. Following up after recent appt at the breast center.

## 2021-08-19 ENCOUNTER — Ambulatory Visit
Admission: RE | Admit: 2021-08-19 | Discharge: 2021-08-19 | Disposition: A | Discharge status: Home / Self Care | Payer: BC Managed Care – PPO | Source: Ambulatory Visit | Attending: Hematology and Oncology | Admitting: Hematology and Oncology

## 2021-08-19 ENCOUNTER — Other Ambulatory Visit (HOSPITAL_COMMUNITY): Payer: Self-pay

## 2021-08-19 DIAGNOSIS — R928 Other abnormal and inconclusive findings on diagnostic imaging of breast: Secondary | ICD-10-CM

## 2021-08-19 IMAGING — US US AXILLARY LEFT
1 series · 9 of 9 positions shown · non-contrast
Comparison: [facility] breast MRI dated [DATE].

CLINICAL DATA: Breast MRI report of [DATE] described a
prominent LEFT axillary lymph node. Patient presents today for
targeted second-look ultrasound.

EXAM:
ULTRASOUND OF THE LEFT AXILLA

[Series 1: us axillary left · 0.07mm/px · 9 of 9 slices shown]
[im 1/9]
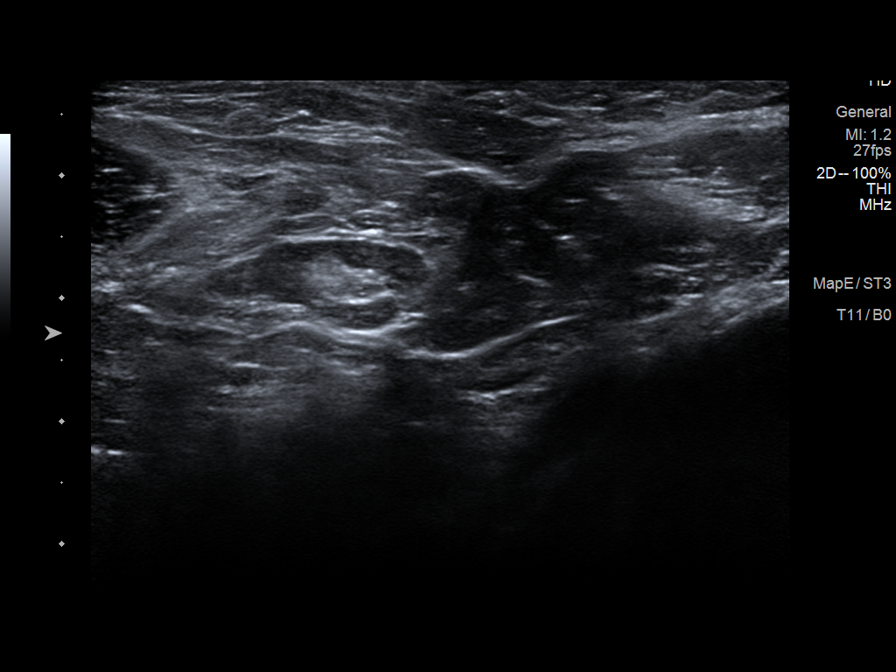
[im 2/9]
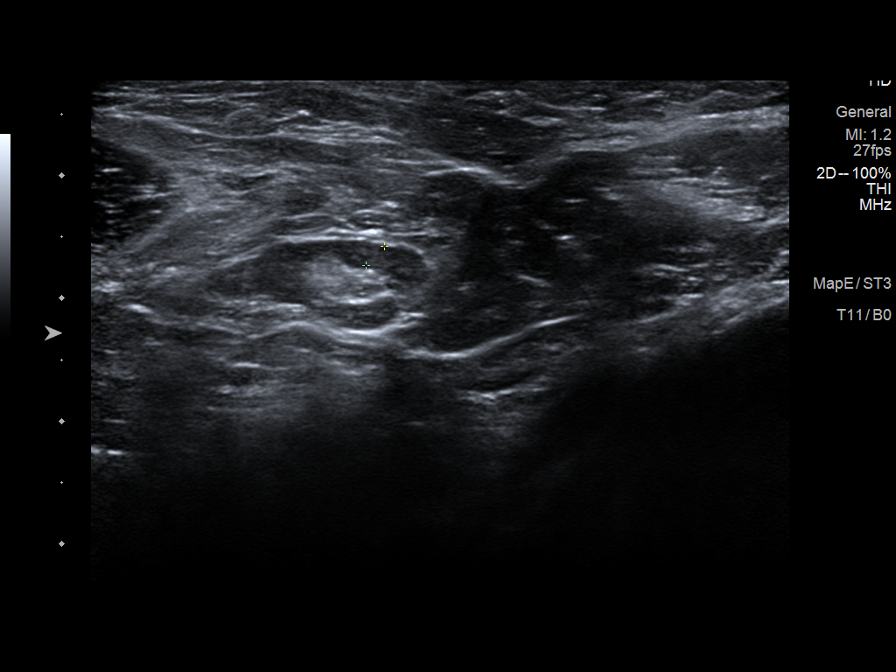
[im 3/9]
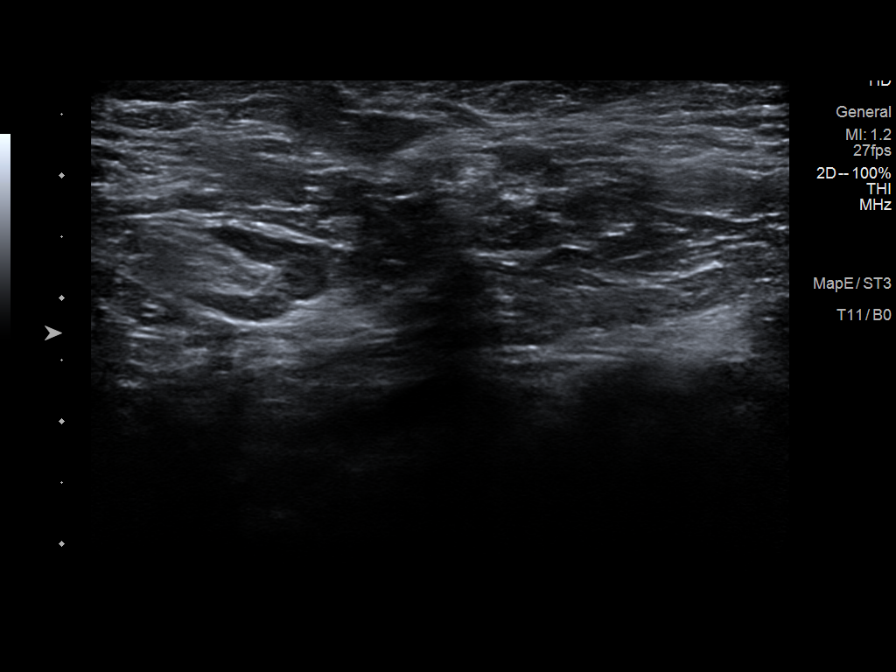
[im 4/9]
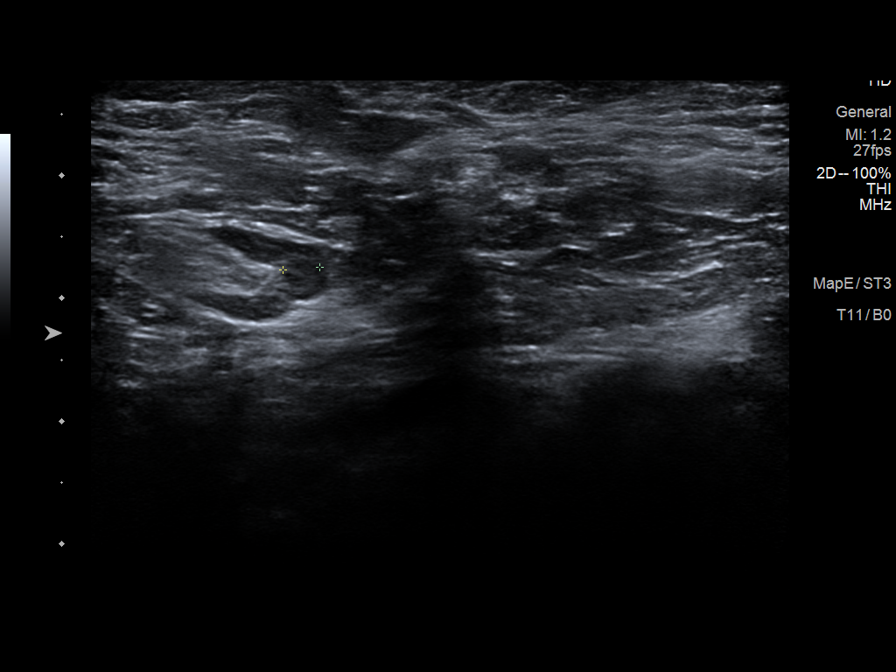
[im 5/9]
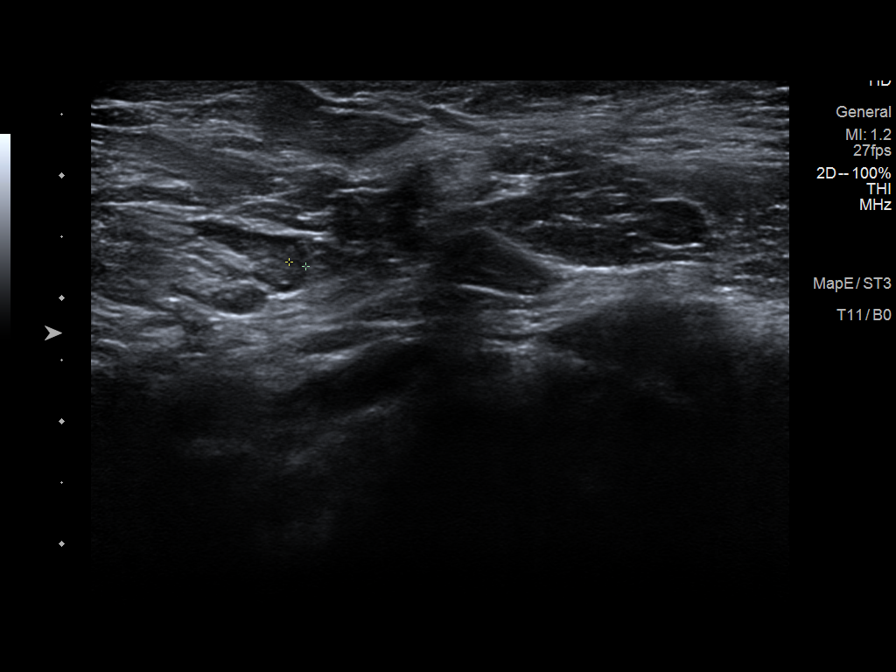
[im 6/9]
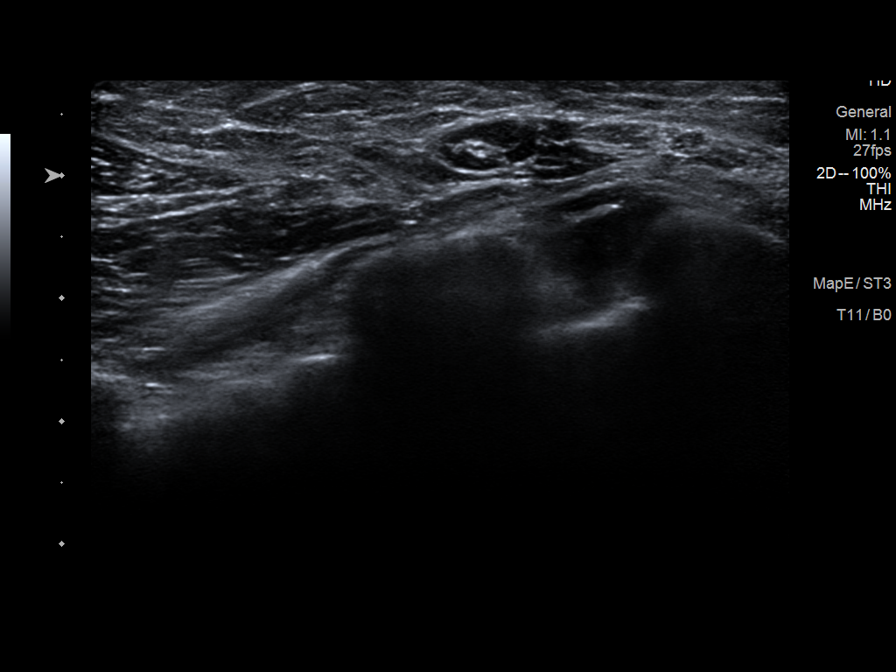
[im 7/9]
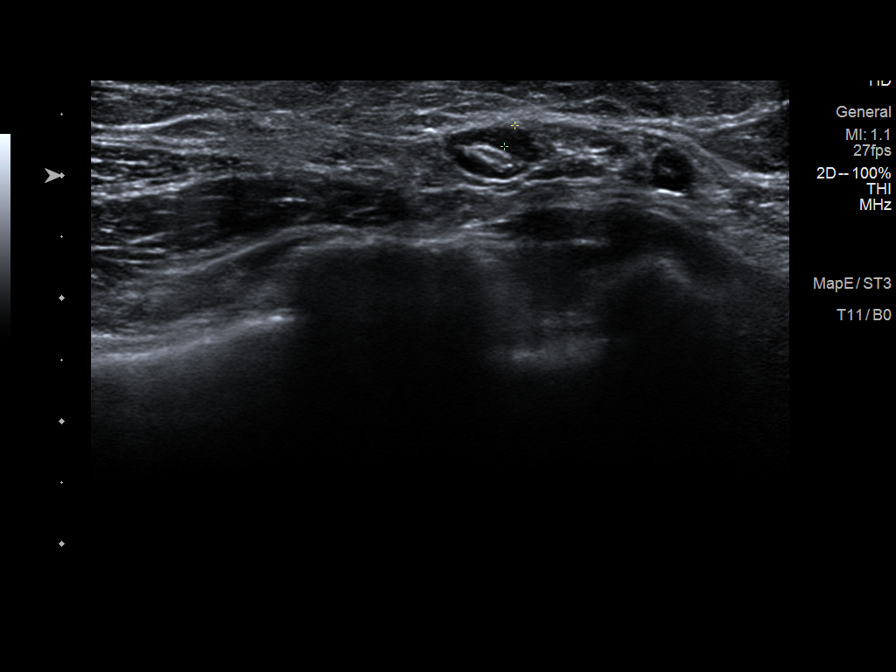
[im 8/9]
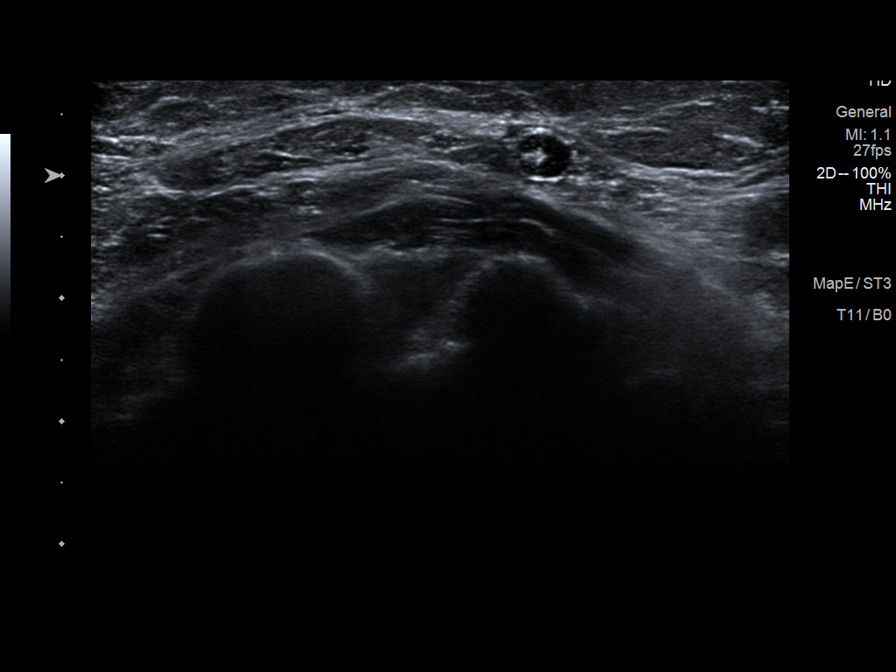
[im 9/9]
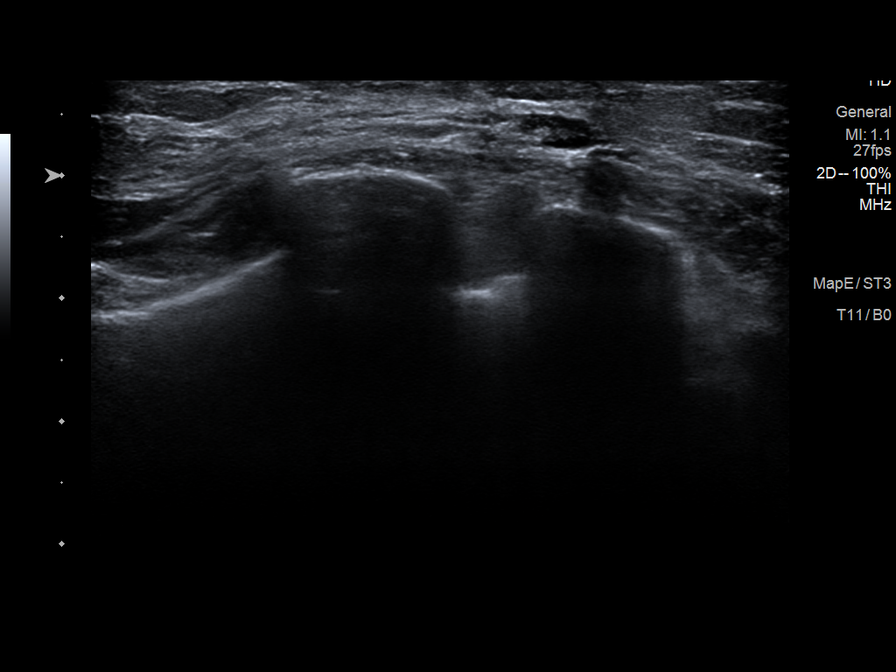

[9 of 9 positions shown; findings below may reference images not displayed]

FINDINGS: Ultrasound is performed, showing multiple normal-appearing lymph
nodes in the LEFT axilla. No enlarged or morphologically abnormal
lymph nodes are identified. No solid or cystic mass.
IMPRESSION: Normal ultrasound of the LEFT axilla. No enlarged or
abnormal-appearing lymph nodes.

RECOMMENDATION:
1. Surgical excision of patient's recently biopsied LEFT breast
complex sclerosing lesion, per MRI-guided breast biopsy report dated
[DATE].
2. Patient is also recommended for a follow-up breast MRI in 6
months, per protocol, as described on the same MRI biopsy report of
[DATE]. Patient is aware.

I have discussed the findings and recommendations with the patient.
If applicable, a reminder letter will be sent to the patient
regarding the next appointment.

BI-RADS CATEGORY  1: Negative.

## 2021-08-28 ENCOUNTER — Other Ambulatory Visit: Payer: Self-pay

## 2021-08-28 ENCOUNTER — Other Ambulatory Visit (HOSPITAL_COMMUNITY): Payer: Self-pay

## 2021-08-28 ENCOUNTER — Ambulatory Visit: Payer: Self-pay | Admitting: General Surgery

## 2021-08-28 ENCOUNTER — Inpatient Hospital Stay: Payer: BC Managed Care – PPO | Attending: Hematology and Oncology

## 2021-08-28 DIAGNOSIS — F17210 Nicotine dependence, cigarettes, uncomplicated: Secondary | ICD-10-CM | POA: Diagnosis not present

## 2021-08-28 DIAGNOSIS — N6489 Other specified disorders of breast: Secondary | ICD-10-CM

## 2021-08-28 DIAGNOSIS — C9210 Chronic myeloid leukemia, BCR/ABL-positive, not having achieved remission: Secondary | ICD-10-CM | POA: Diagnosis present

## 2021-08-28 DIAGNOSIS — Z1509 Genetic susceptibility to other malignant neoplasm: Secondary | ICD-10-CM | POA: Insufficient documentation

## 2021-08-28 LAB — CBC WITH DIFFERENTIAL/PLATELET
Abs Immature Granulocytes: 0.01 10*3/uL (ref 0.00–0.07)
Basophils Absolute: 0.1 10*3/uL (ref 0.0–0.1)
Basophils Relative: 1 %
Eosinophils Absolute: 0.3 10*3/uL (ref 0.0–0.5)
Eosinophils Relative: 4 %
HCT: 34.6 % — ABNORMAL LOW (ref 36.0–46.0)
Hemoglobin: 12.2 g/dL (ref 12.0–15.0)
Immature Granulocytes: 0 %
Lymphocytes Relative: 40 %
Lymphs Abs: 2.5 10*3/uL (ref 0.7–4.0)
MCH: 33.7 pg (ref 26.0–34.0)
MCHC: 35.3 g/dL (ref 30.0–36.0)
MCV: 95.6 fL (ref 80.0–100.0)
Monocytes Absolute: 0.4 10*3/uL (ref 0.1–1.0)
Monocytes Relative: 7 %
Neutro Abs: 3.1 10*3/uL (ref 1.7–7.7)
Neutrophils Relative %: 48 %
Platelets: 279 10*3/uL (ref 150–400)
RBC: 3.62 MIL/uL — ABNORMAL LOW (ref 3.87–5.11)
RDW: 12.3 % (ref 11.5–15.5)
WBC: 6.4 10*3/uL (ref 4.0–10.5)
nRBC: 0 % (ref 0.0–0.2)

## 2021-08-28 LAB — COMPREHENSIVE METABOLIC PANEL
ALT: 20 U/L (ref 0–44)
AST: 21 U/L (ref 15–41)
Albumin: 4.3 g/dL (ref 3.5–5.0)
Alkaline Phosphatase: 107 U/L (ref 38–126)
Anion gap: 6 (ref 5–15)
BUN: 16 mg/dL (ref 6–20)
CO2: 26 mmol/L (ref 22–32)
Calcium: 9 mg/dL (ref 8.9–10.3)
Chloride: 108 mmol/L (ref 98–111)
Creatinine, Ser: 0.98 mg/dL (ref 0.44–1.00)
GFR, Estimated: >60 mL/min (ref >60)
Glucose, Bld: 101 mg/dL — ABNORMAL HIGH (ref 70–99)
Potassium: 3.8 mmol/L (ref 3.5–5.1)
Sodium: 140 mmol/L (ref 135–145)
Total Bilirubin: 0.5 mg/dL (ref 0.3–1.2)
Total Protein: 6.6 g/dL (ref 6.5–8.1)

## 2021-08-29 ENCOUNTER — Other Ambulatory Visit (HOSPITAL_COMMUNITY): Payer: Self-pay

## 2021-08-30 ENCOUNTER — Other Ambulatory Visit (HOSPITAL_COMMUNITY): Payer: Self-pay

## 2021-09-02 ENCOUNTER — Other Ambulatory Visit (HOSPITAL_COMMUNITY): Payer: Self-pay

## 2021-09-04 LAB — BCR/ABL

## 2021-09-05 ENCOUNTER — Other Ambulatory Visit: Payer: Self-pay | Admitting: General Surgery

## 2021-09-05 ENCOUNTER — Other Ambulatory Visit (HOSPITAL_COMMUNITY): Payer: Self-pay

## 2021-09-05 DIAGNOSIS — N6489 Other specified disorders of breast: Secondary | ICD-10-CM

## 2021-09-06 ENCOUNTER — Ambulatory Visit: Payer: BC Managed Care – PPO | Admitting: Hematology and Oncology

## 2021-09-12 ENCOUNTER — Other Ambulatory Visit: Payer: Self-pay

## 2021-09-12 ENCOUNTER — Encounter: Payer: Self-pay | Admitting: Hematology and Oncology

## 2021-09-12 ENCOUNTER — Inpatient Hospital Stay: Payer: BC Managed Care – PPO | Admitting: Hematology and Oncology

## 2021-09-12 DIAGNOSIS — Z1589 Genetic susceptibility to other disease: Secondary | ICD-10-CM

## 2021-09-12 DIAGNOSIS — Z1501 Genetic susceptibility to malignant neoplasm of breast: Secondary | ICD-10-CM | POA: Diagnosis not present

## 2021-09-12 DIAGNOSIS — C9210 Chronic myeloid leukemia, BCR/ABL-positive, not having achieved remission: Secondary | ICD-10-CM

## 2021-09-12 DIAGNOSIS — Z1509 Genetic susceptibility to other malignant neoplasm: Secondary | ICD-10-CM

## 2021-09-12 DIAGNOSIS — Z1502 Genetic susceptibility to malignant neoplasm of ovary: Secondary | ICD-10-CM

## 2021-09-12 DIAGNOSIS — F17210 Nicotine dependence, cigarettes, uncomplicated: Secondary | ICD-10-CM | POA: Diagnosis not present

## 2021-09-12 NOTE — Assessment & Plan Note (Signed)
We discussed the benefit of smoking cessation and she will try her best to quit smoking

## 2021-09-12 NOTE — Progress Notes (Signed)
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 switching her over to Sprycel, we see significant drop of detectable BCR/ABL in molecular studies She tolerated treatment very well We will continue the current dose of Sprycel and I will see her back in a month She does not need to hold Sprycel prior to her surgery   Continuous dependence on cigarette smoking We discussed the benefit of smoking cessation and she will try her best to quit smoking  Monoallelic mutation of CHEK2 gene in female patient I have reviewed her recent imaging study and biopsy report We discussed the role of prophylactic bilateral mastectomy versus lumpectomy with close monitoring with surveillance MRI alternate with mammogram We also discussed the role of chemoprevention with tamoxifen Ultimately, she is undecided She has appointment to see general surgery for lumpectomy soon I will see her again next month for further follow-up  Orders Placed This Encounter  Procedures   EKG 12-Lead    Ordered by [name]     All questions were answered. The patient knows to call the clinic with any problems, questions or concerns. The total time spent in the appointment was 30 minutes encounter with patients including review of chart and various tests results, discussions about plan of care and coordination of care plan   Natalie Lark, MD 09/12/2021 2:36 PM  INTERVAL HISTORY: Please see below for problem oriented charting. she returns for treatment follow-up on Sprycel for CML She was also found to have complex sclerosing lesion, excised on recent biopsy and is scheduled for lumpectomy The patient is known to have [place] EK 2 mutation on active surveillance She tolerated Sprycel well No side effects The patient is undecided about her decision for future breast surgery She is  smoking but is attempting to quit  REVIEW OF SYSTEMS:   Constitutional: Denies fevers, chills or abnormal weight loss Eyes: Denies blurriness of vision Ears, nose, mouth, throat, and face: Denies mucositis or sore throat Respiratory: Denies cough, dyspnea or wheezes Cardiovascular: Denies palpitation, chest discomfort or lower extremity swelling Gastrointestinal:  Denies nausea, heartburn or change in bowel habits Skin: Denies abnormal skin rashes Lymphatics: Denies new lymphadenopathy or easy bruising Neurological:Denies numbness, tingling or new weaknesses Behavioral/Psych: Mood is stable, no new changes  All other systems were reviewed with the patient and are negative.  I have reviewed the past medical history, past surgical history, social history and family history with the patient and they are unchanged from previous note.  ALLERGIES:  is allergic to other, sulfa antibiotics, wellbutrin [bupropion], and piperacillin sod-tazobactam so.  MEDICATIONS:  Current Outpatient Medications  Medication Sig Dispense Refill   LORazepam (ATIVAN) 1 MG tablet Take 1 tablet (1 mg total) by mouth 2 (two) times daily as needed for anxiety. 10 tablet 0   acetaminophen (TYLENOL) 325 MG tablet Take 325 mg by mouth every 6 (six) hours as needed (pain).      cholestyramine (QUESTRAN) 4 GM/DOSE powder Take 4 g by mouth daily.     clonazePAM (KLONOPIN) 0.5 MG tablet Take 0.5 mg by mouth 2 (two) times daily as needed for anxiety.     dasatinib (SPRYCEL) 70 MG tablet Take 1 tablet (70 mg total) by mouth daily. 30 tablet 11   escitalopram (LEXAPRO) 20 MG tablet Take 30 mg by mouth daily.     ibuprofen (ADVIL) 200 MG tablet Take 200 mg  by mouth every 6 (six) hours as needed for moderate pain.      Lactobacillus (PROBIOTIC ACIDOPHILUS PO) Take 1 capsule by mouth daily.     loperamide (IMODIUM) 2 MG capsule Take 2 mg by mouth daily as needed for diarrhea or loose stools.     metaxalone (SKELAXIN) 400 MG tablet  Take 1 tablet (400 mg total) by mouth 3 (three) times daily as needed for muscle spasms. 15 tablet 0   Multiple Vitamin (MULTIVITAMIN WITH MINERALS) TABS tablet Take 1 tablet by mouth daily.     ondansetron (ZOFRAN) 4 MG tablet Take 1 tablet (4 mg total) by mouth every 8 (eight) hours as needed for nausea or vomiting. 30 tablet 0   No current facility-administered medications for this visit.    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[place])   03/01/2021 Pathology Results   BCR/ABL by FISH: 91% positive BRC/ABL by PCR: e13a2 (b2a2) by IS is 196.685%   04/25/2021 -  Chemotherapy   She started taking imatinib   05/15/2021 Pathology Results   BRC/ABL is detected by PCR: e13a2 (b2a2) by IS is 70.731%   06/17/2021 Pathology Results   e13a2 (b2a2) by [employer] (IS): 39.7401%   07/24/2021 Pathology Results   e13a2 (b2a2) by [employer] (IS): 33.3%   08/30/2021 Pathology Results   e13a2 (b2a2) by [employer] (IS): 9.8004%     PHYSICAL EXAMINATION: ECOG PERFORMANCE STATUS: 0 - Asymptomatic  Vitals:   09/12/21 1204  BP: 100/67  Pulse: 71  Resp: 18  Temp: 98.6 F (37 C)  SpO2: 97%   Filed Weights   09/12/21 1204  Weight: 141 lb 6.4 oz (64.1 kg)    GENERAL:alert, no distress and comfortable NEURO: alert & oriented x 3 with fluent speech, no focal motor/sensory deficits  LABORATORY DATA:  I have reviewed the data as listed    Component Value Date/Time   NA 140 08/28/2021 1230   K 3.8 08/28/2021 1230   CL 108 08/28/2021 1230   CO2 26 08/28/2021 1230   GLUCOSE 101 (H) 08/28/2021 1230   BUN 16 08/28/2021 1230   CREATININE 0.98 08/28/2021 1230   CALCIUM 9.0 08/28/2021 1230   PROT 6.6 08/28/2021 1230   ALBUMIN 4.3 08/28/2021 1230   AST 21 08/28/2021 1230   ALT 20 08/28/2021 1230   ALKPHOS 107 08/28/2021 1230   BILITOT 0.5 08/28/2021 1230   GFRNONAA >60 08/28/2021 1230    GFRAA >60 10/21/2019 1020    No results found for: SPEP, UPEP  Lab Results  Component Value Date   WBC 6.4 08/28/2021   NEUTROABS 3.1 08/28/2021   HGB 12.2 08/28/2021   HCT 34.6 (L) 08/28/2021   MCV 95.6 08/28/2021   PLT 279 08/28/2021      Chemistry      Component Value Date/Time   NA 140 08/28/2021 1230   K 3.8 08/28/2021 1230   CL 108 08/28/2021 1230   CO2 26 08/28/2021 1230   BUN 16 08/28/2021 1230   CREATININE 0.98 08/28/2021 1230      Component Value Date/Time   CALCIUM 9.0 08/28/2021 1230   ALKPHOS 107 08/28/2021 1230   AST 21 08/28/2021 1230   ALT 20 08/28/2021 1230   BILITOT 0.5 08/28/2021 1230       RADIOGRAPHIC STUDIES: I have personally reviewed the radiological images as listed and agreed with the findings in the report. [place] AXILLA LEFT  Result Date: 08/19/2021 CLINICAL DATA:  Breast MRI report of 08/06/2021 described a prominent LEFT axillary lymph node. Patient presents today for targeted second-look ultrasound. EXAM: ULTRASOUND OF THE LEFT AXILLA COMPARISON:  [facility] breast MRI dated 08/02/2021. FINDINGS: Ultrasound is performed, showing multiple normal-appearing lymph nodes in the LEFT axilla. No enlarged or morphologically abnormal lymph nodes are identified. No solid or cystic mass. IMPRESSION: Normal ultrasound of the LEFT axilla. No enlarged or abnormal-appearing lymph nodes. RECOMMENDATION: 1. Surgical excision of patient's recently biopsied LEFT breast complex sclerosing lesion, per MRI-guided breast biopsy report dated 08/12/2021. 2. Patient is also recommended for a follow-up breast MRI in 6 months, per protocol, as described on the same MRI biopsy report of 08/12/2021. Patient is aware. I have discussed the findings and recommendations with the patient. If applicable, a reminder letter will be sent to the patient regarding the next appointment. BI-RADS CATEGORY  1: Negative. Electronically Signed   By: Franki Cabot M.D.   On: 08/19/2021 15:02

## 2021-09-12 NOTE — Assessment & Plan Note (Signed)
I have reviewed her recent imaging study and biopsy report We discussed the role of prophylactic bilateral mastectomy versus lumpectomy with close monitoring with surveillance MRI alternate with mammogram We also discussed the role of chemoprevention with tamoxifen Ultimately, she is undecided She has appointment to see general surgery for lumpectomy soon I will see her again next month for further follow-up

## 2021-09-12 NOTE — Assessment & Plan Note (Signed)
Since switching her over to Sprycel, we see significant drop of detectable BCR/ABL in molecular studies She tolerated treatment very well We will continue the current dose of Sprycel and I will see her back in a month She does not need to hold Sprycel prior to her surgery

## 2021-09-17 ENCOUNTER — Other Ambulatory Visit (HOSPITAL_COMMUNITY): Payer: Self-pay

## 2021-09-19 ENCOUNTER — Other Ambulatory Visit (HOSPITAL_COMMUNITY): Payer: Self-pay

## 2021-09-23 ENCOUNTER — Other Ambulatory Visit (HOSPITAL_COMMUNITY): Payer: Self-pay

## 2021-09-24 ENCOUNTER — Other Ambulatory Visit (HOSPITAL_COMMUNITY): Payer: Self-pay

## 2021-09-25 ENCOUNTER — Encounter (HOSPITAL_BASED_OUTPATIENT_CLINIC_OR_DEPARTMENT_OTHER): Payer: Self-pay | Admitting: General Surgery

## 2021-09-27 ENCOUNTER — Other Ambulatory Visit (HOSPITAL_COMMUNITY): Payer: Self-pay

## 2021-10-02 ENCOUNTER — Ambulatory Visit
Admission: RE | Admit: 2021-10-02 | Discharge: 2021-10-02 | Disposition: A | Discharge status: Home / Self Care | Payer: BC Managed Care – PPO | Source: Ambulatory Visit | Attending: General Surgery | Admitting: General Surgery

## 2021-10-02 DIAGNOSIS — N6489 Other specified disorders of breast: Secondary | ICD-10-CM

## 2021-10-02 IMAGING — MG MM PLC BREAST LOC DEV 1ST LESION INC MAMMO GUIDE*L*
5 series · 5 of 5 positions shown · non-contrast
Comparison: Previous exam(s).

CLINICAL DATA: Patient with left breast complex sclerosing lesion,
for preoperative localization.

EXAM:
MAMMOGRAPHIC GUIDED RADIOACTIVE SEED LOCALIZATION OF THE LEFT BREAST

[L CC (1 of 2)]
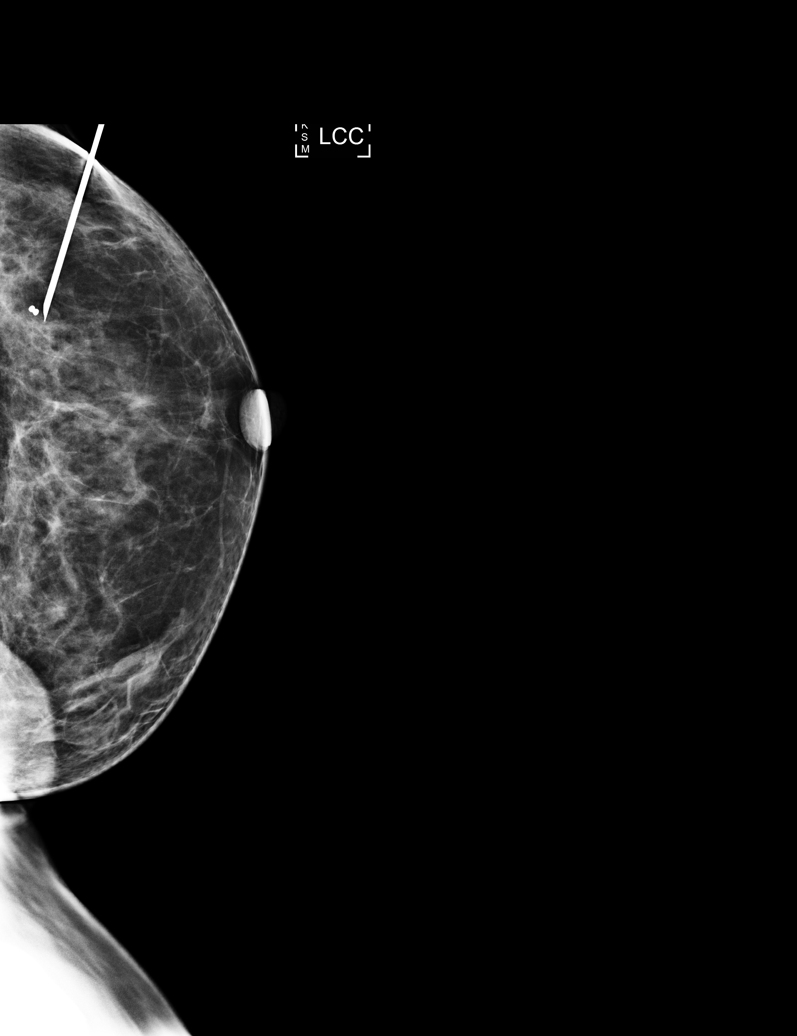

[L LM (1 of 3)]
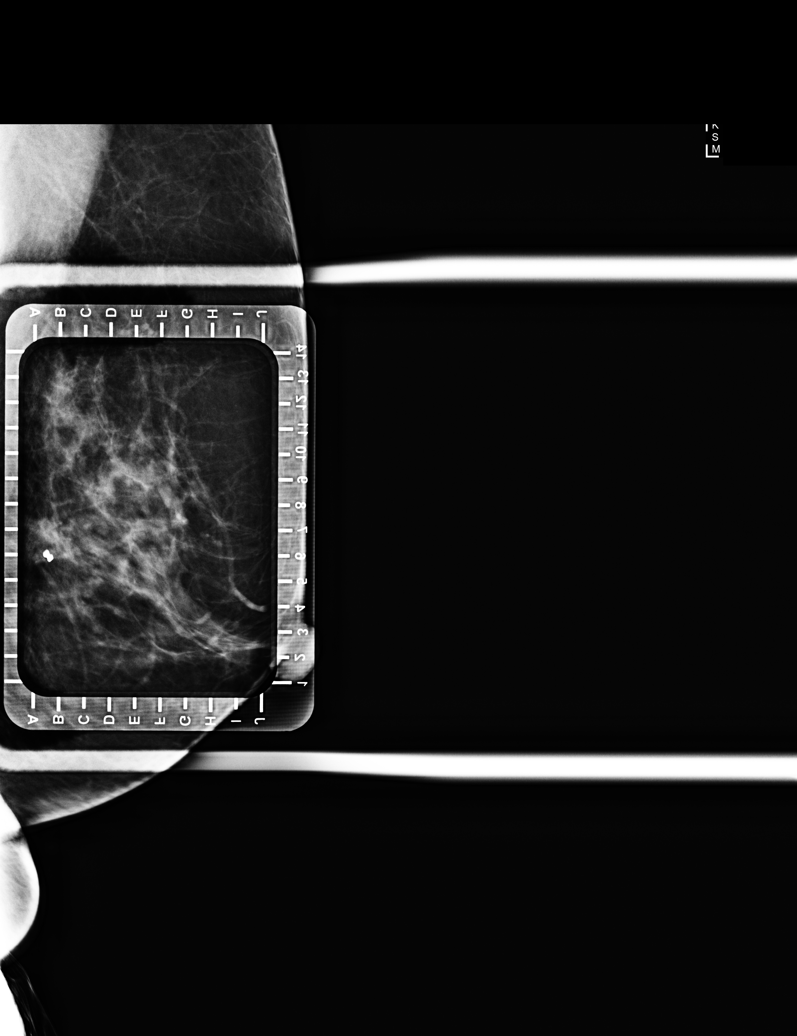

[L LM (2 of 3)]
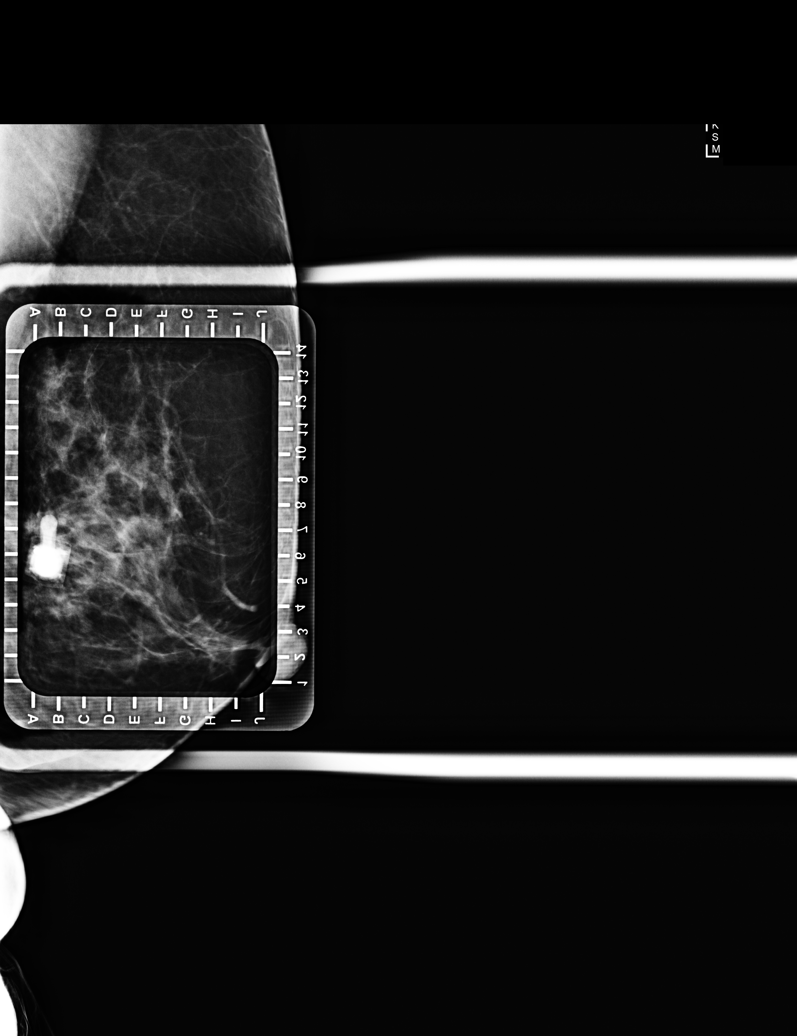

[L LM (3 of 3)]
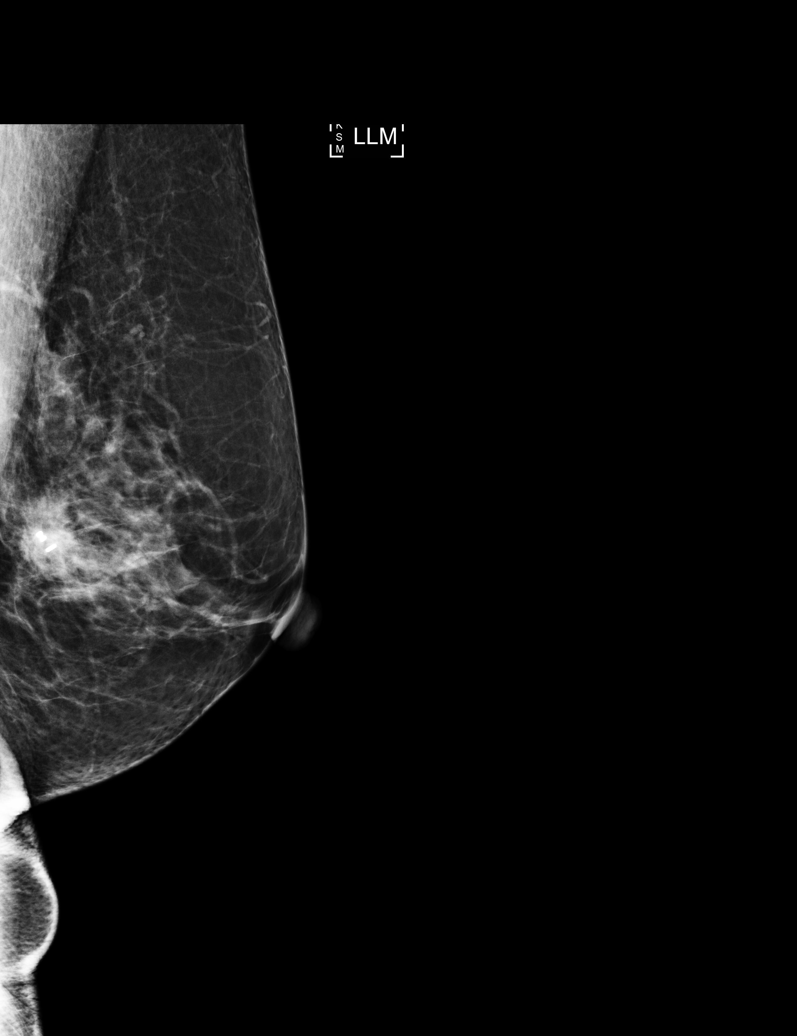

[L CC (2 of 2)]
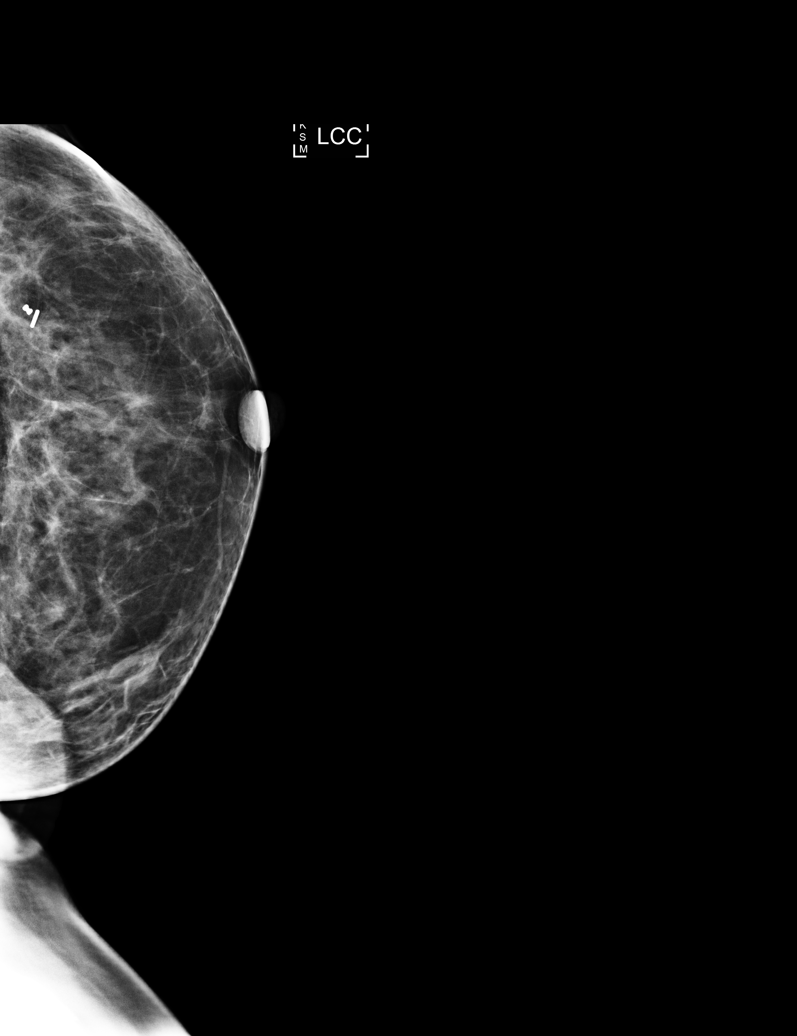

[5 of 5 positions shown; findings below may reference images not displayed]

FINDINGS: Patient presents for radioactive seed localization prior to left
breast excisional biopsy. I met with the patient and we discussed
the procedure of seed localization including benefits and
alternatives. We discussed the high likelihood of a successful
procedure. We discussed the risks of the procedure including
infection, bleeding, tissue injury and further surgery. We discussed
the low dose of radioactivity involved in the procedure. Informed,
written consent was given.

The usual time-out protocol was performed immediately prior to the
procedure.

Using mammographic guidance, sterile technique, 1% lidocaine and an
[WT] radioactive seed, barbell clip was localized using a lateral
approach. The follow-up mammogram images confirm the seed in the
expected location and were marked for Dr. JOCIELLY.

Follow-up survey of the patient confirms presence of the radioactive
seed.

Order number of [WT] seed:  [PHONE_NUMBER].

Total activity:  0.251 millicuries reference Date: [DATE]

The patient tolerated the procedure well and was released from the
[REDACTED]. She was given instructions regarding seed removal.
IMPRESSION: Radioactive seed localization left breast. No apparent
complications.

## 2021-10-02 MED ORDER — CHLORHEXIDINE GLUCONATE CLOTH 2 % EX PADS: 6.0000 | MEDICATED_PAD | Freq: Once | CUTANEOUS | Status: DC

## 2021-10-02 NOTE — Progress Notes (Signed)

## 2021-10-03 ENCOUNTER — Ambulatory Visit (HOSPITAL_BASED_OUTPATIENT_CLINIC_OR_DEPARTMENT_OTHER): Payer: BC Managed Care – PPO | Admitting: Certified Registered"

## 2021-10-03 ENCOUNTER — Encounter (HOSPITAL_BASED_OUTPATIENT_CLINIC_OR_DEPARTMENT_OTHER): Payer: Self-pay | Admitting: General Surgery

## 2021-10-03 ENCOUNTER — Other Ambulatory Visit: Payer: Self-pay

## 2021-10-03 ENCOUNTER — Other Ambulatory Visit (HOSPITAL_COMMUNITY): Payer: Self-pay

## 2021-10-03 ENCOUNTER — Ambulatory Visit (HOSPITAL_BASED_OUTPATIENT_CLINIC_OR_DEPARTMENT_OTHER)
Admission: RE | Admit: 2021-10-03 | Discharge: 2021-10-03 | Disposition: A | Discharge status: Home / Self Care | Payer: BC Managed Care – PPO | Attending: General Surgery | Admitting: General Surgery

## 2021-10-03 ENCOUNTER — Ambulatory Visit
Admission: RE | Admit: 2021-10-03 | Discharge: 2021-10-03 | Disposition: A | Discharge status: Home / Self Care | Payer: BC Managed Care – PPO | Source: Ambulatory Visit | Attending: General Surgery | Admitting: General Surgery

## 2021-10-03 ENCOUNTER — Encounter (HOSPITAL_BASED_OUTPATIENT_CLINIC_OR_DEPARTMENT_OTHER)
Admission: RE | Disposition: A | Discharge status: Home / Self Care | Payer: Self-pay | Source: Home / Self Care | Attending: General Surgery

## 2021-10-03 DIAGNOSIS — N6489 Other specified disorders of breast: Secondary | ICD-10-CM | POA: Diagnosis not present

## 2021-10-03 DIAGNOSIS — N6082 Other benign mammary dysplasias of left breast: Secondary | ICD-10-CM | POA: Insufficient documentation

## 2021-10-03 DIAGNOSIS — N6320 Unspecified lump in the left breast, unspecified quadrant: Secondary | ICD-10-CM | POA: Diagnosis present

## 2021-10-03 DIAGNOSIS — F17210 Nicotine dependence, cigarettes, uncomplicated: Secondary | ICD-10-CM | POA: Insufficient documentation

## 2021-10-03 DIAGNOSIS — Z803 Family history of malignant neoplasm of breast: Secondary | ICD-10-CM | POA: Diagnosis not present

## 2021-10-03 DIAGNOSIS — C9210 Chronic myeloid leukemia, BCR/ABL-positive, not having achieved remission: Secondary | ICD-10-CM | POA: Insufficient documentation

## 2021-10-03 DIAGNOSIS — N6022 Fibroadenosis of left breast: Secondary | ICD-10-CM | POA: Diagnosis not present

## 2021-10-03 DIAGNOSIS — F419 Anxiety disorder, unspecified: Secondary | ICD-10-CM | POA: Diagnosis not present

## 2021-10-03 HISTORY — DX: Anxiety disorder, unspecified: F41.9

## 2021-10-03 HISTORY — PX: BREAST LUMPECTOMY WITH RADIOACTIVE SEED LOCALIZATION: SHX6424

## 2021-10-03 IMAGING — DX MM BREAST SURGICAL SPECIMEN
1 series · 2 of 2 positions shown · non-contrast
Comparison: Previous exam(s).

CLINICAL DATA: Post lumpectomy specimen radiograph

EXAM:
SPECIMEN RADIOGRAPH OF THE LEFT BREAST

[Series 1: specimen digital x-ray · left · 0.07mm/px · 2 of 2 slices shown]
[im 1/2]
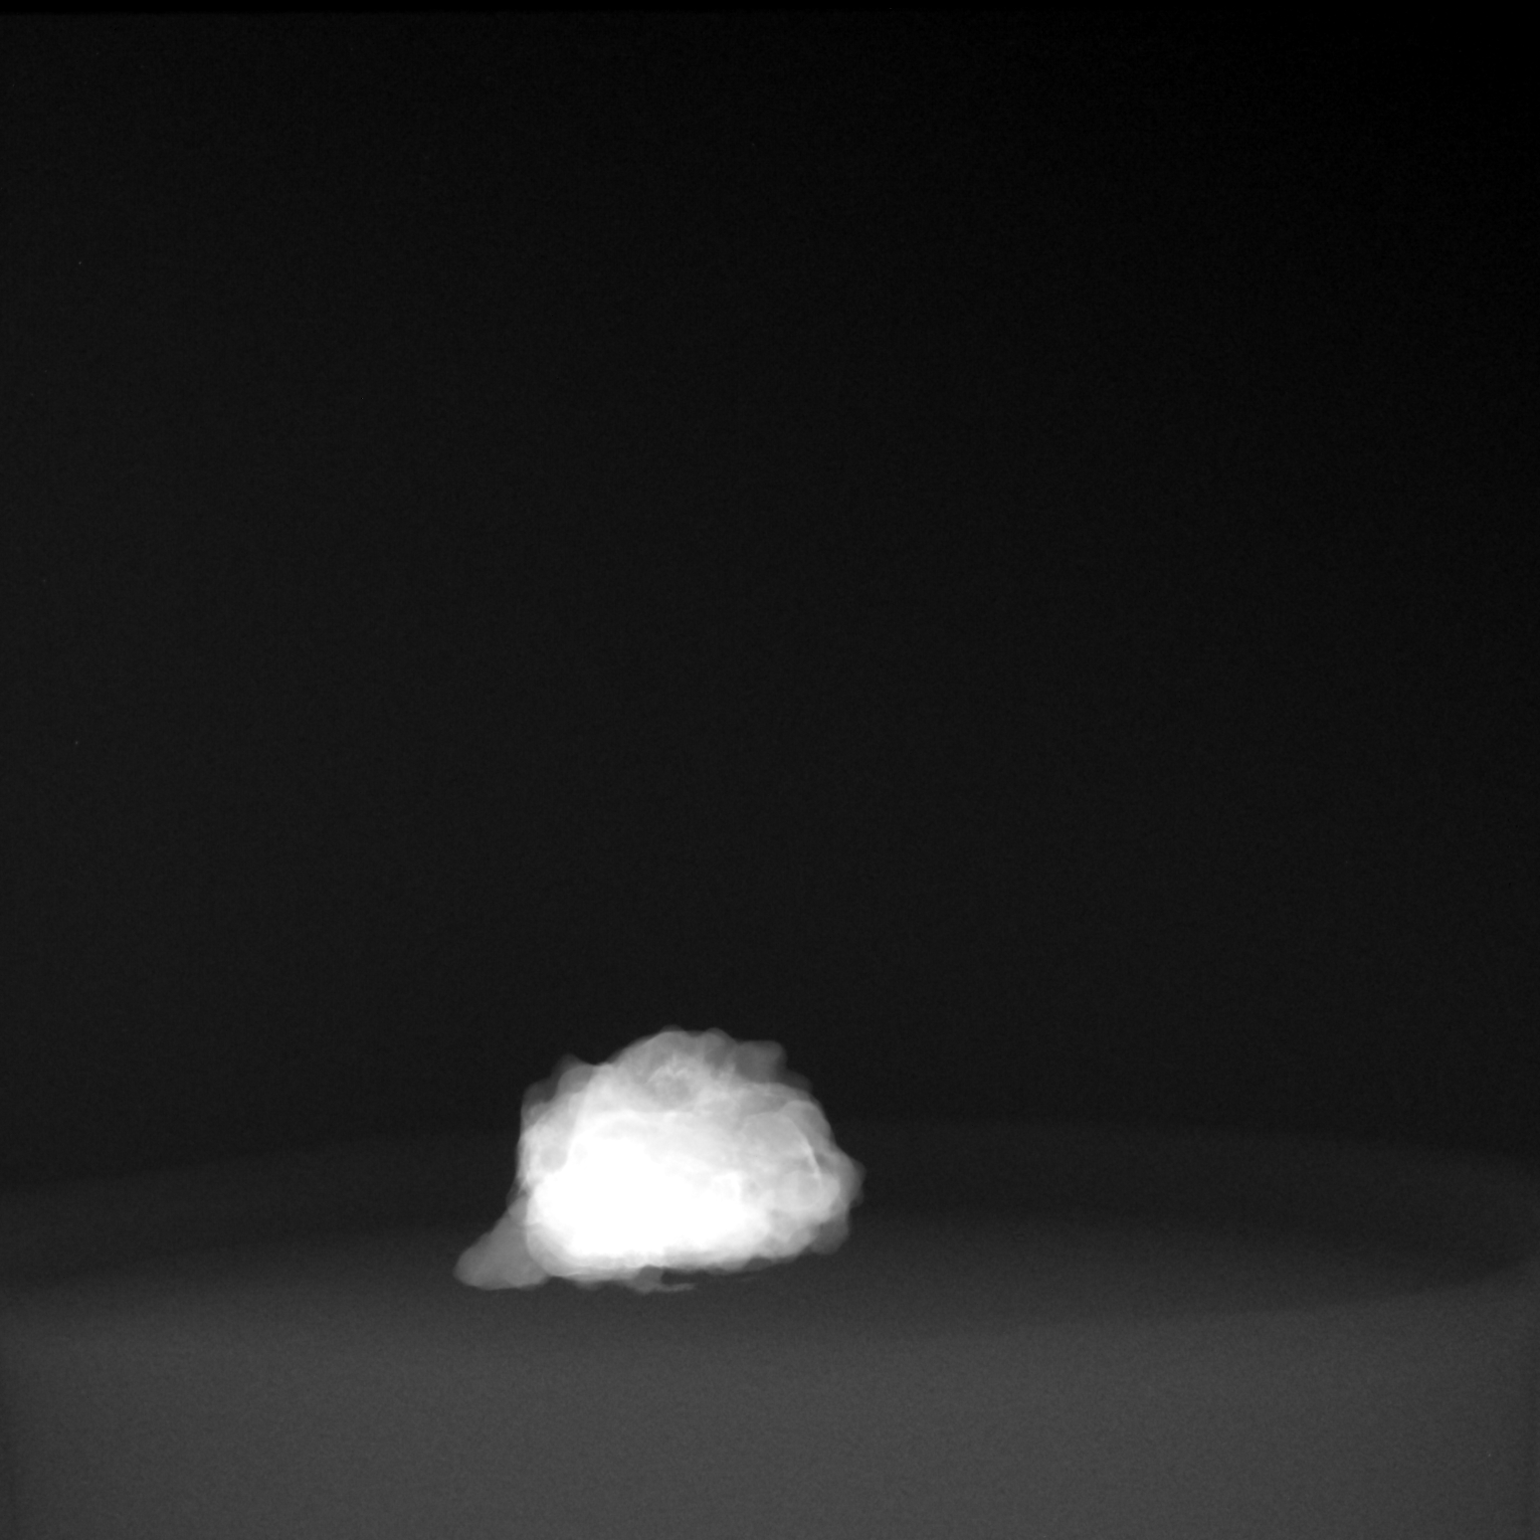
[im 2/2]
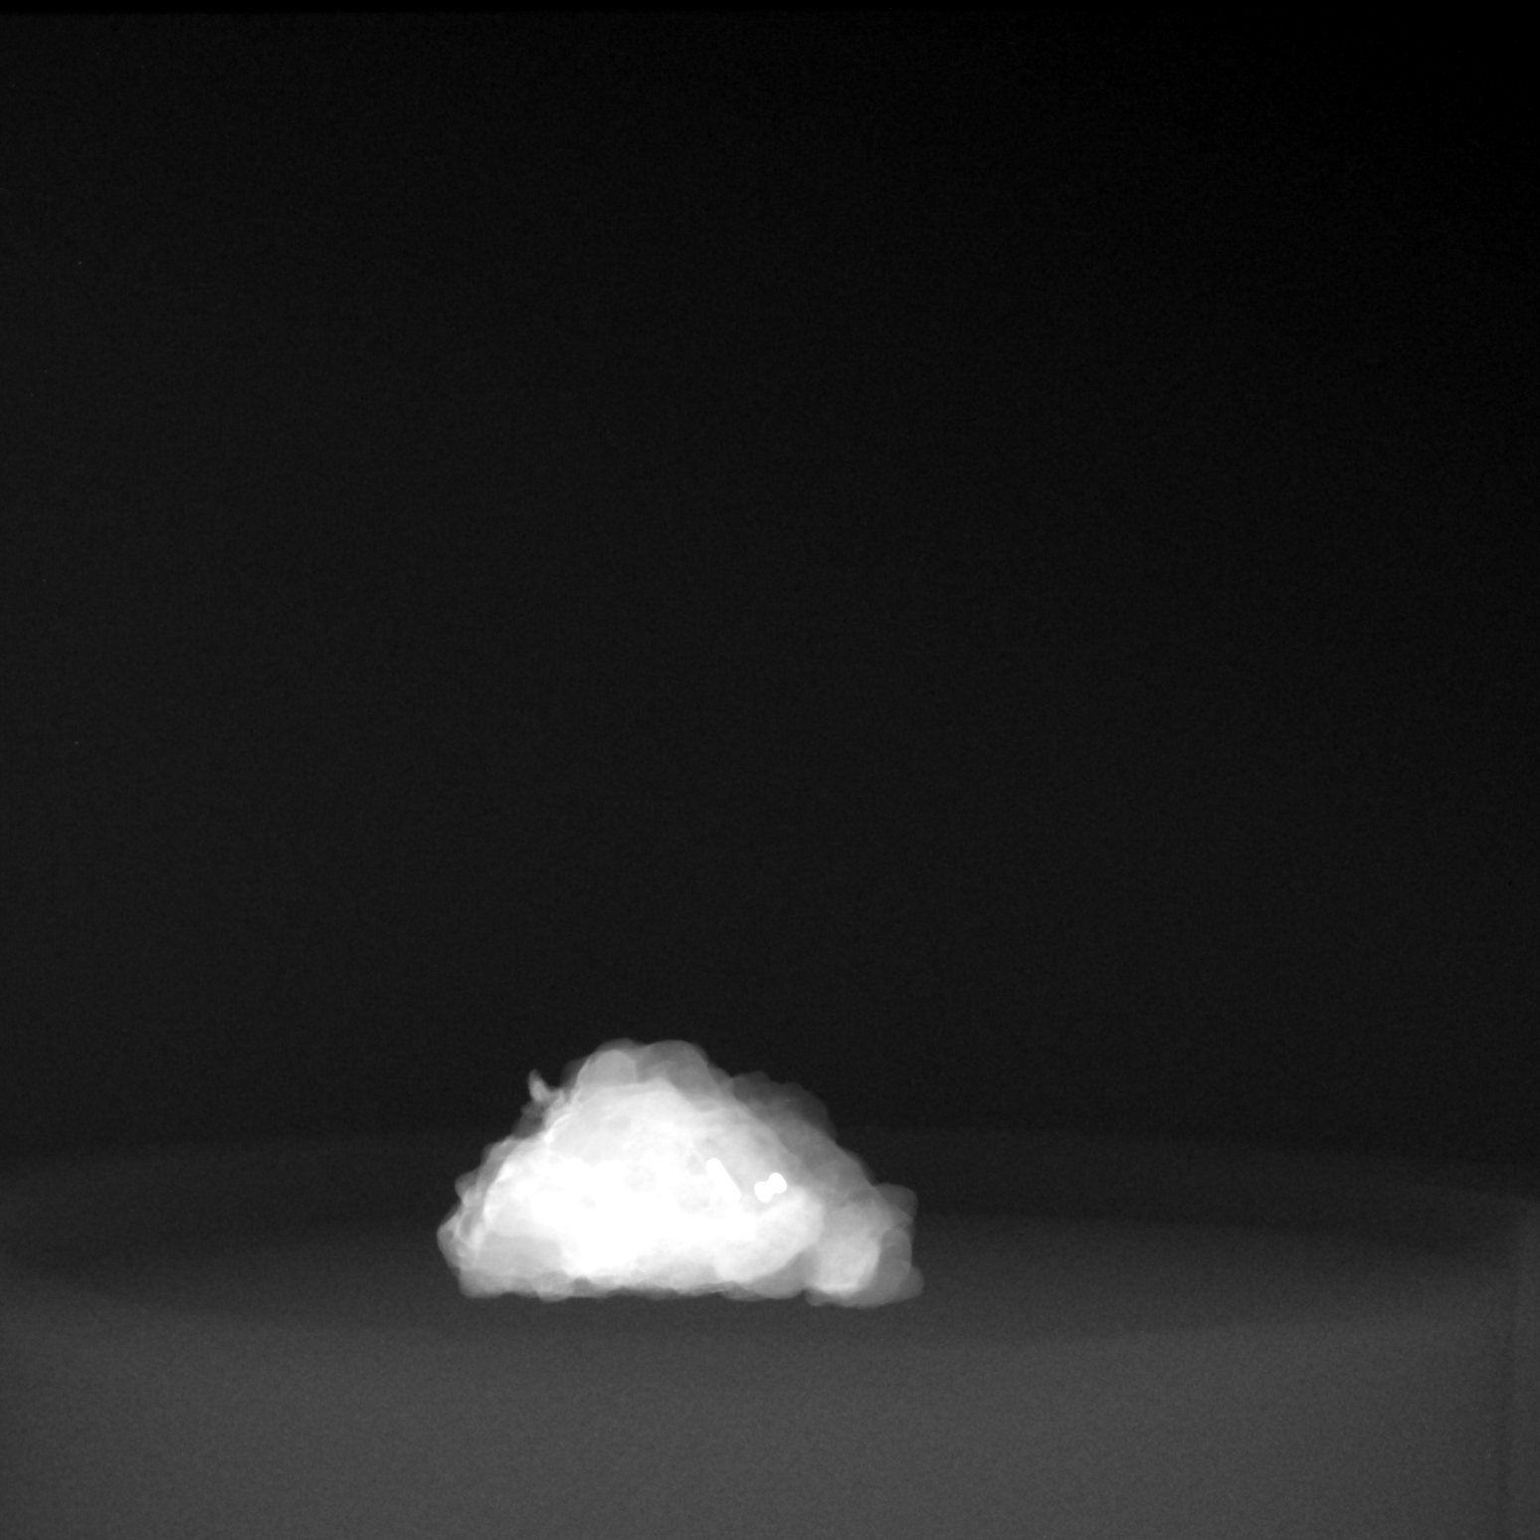

[2 of 2 positions shown; findings below may reference images not displayed]

FINDINGS: Status post excision of the left breast. The radioactive seed and
biopsy marker clip are present, completely intact, and were marked
for pathology.
IMPRESSION: Specimen radiograph of the left breast.

These results were called by telephone at the time of interpretation
on [DATE] at [DATE] to provider JENNA , who verbally
acknowledged these results.

## 2021-10-03 SURGERY — BREAST LUMPECTOMY WITH RADIOACTIVE SEED LOCALIZATION
Anesthesia: General | Site: Breast | Laterality: Left

## 2021-10-03 MED ORDER — OXYCODONE HCL 5 MG PO TABS: 5.0000 mg | ORAL_TABLET | Freq: Once | ORAL | Status: DC | PRN

## 2021-10-03 MED ORDER — FENTANYL CITRATE (PF) 100 MCG/2ML IJ SOLN: 25.0000 ug | INTRAMUSCULAR | Status: DC | PRN

## 2021-10-03 MED ORDER — PROPOFOL 10 MG/ML IV BOLUS
INTRAVENOUS | Status: DC | PRN
  Administered 2021-10-03: 150 mg via INTRAVENOUS
  Administered 2021-10-03: 50 mg via INTRAVENOUS

## 2021-10-03 MED ORDER — OXYCODONE HCL 5 MG PO TABS: 5.0000 mg | ORAL_TABLET | Freq: Four times a day (QID) | ORAL | 0 refills | Status: DC | PRN

## 2021-10-03 MED ORDER — GABAPENTIN 300 MG PO CAPS
ORAL_CAPSULE | ORAL | Status: AC
  Filled 2021-10-03: qty 1

## 2021-10-03 MED ORDER — BUPIVACAINE-EPINEPHRINE (PF) 0.25% -1:200000 IJ SOLN
INTRAMUSCULAR | Status: AC
  Filled 2021-10-03: qty 90

## 2021-10-03 MED ORDER — ONDANSETRON HCL 4 MG/2ML IJ SOLN
INTRAMUSCULAR | Status: DC | PRN
  Administered 2021-10-03: 4 mg via INTRAVENOUS

## 2021-10-03 MED ORDER — DEXAMETHASONE SODIUM PHOSPHATE 4 MG/ML IJ SOLN
INTRAMUSCULAR | Status: DC | PRN
  Administered 2021-10-03: 5 mg via INTRAVENOUS

## 2021-10-03 MED ORDER — ACETAMINOPHEN 500 MG PO TABS: 1000.0000 mg | ORAL_TABLET | ORAL | Status: DC

## 2021-10-03 MED ORDER — MIDAZOLAM HCL 2 MG/2ML IJ SOLN
INTRAMUSCULAR | Status: AC
  Filled 2021-10-03: qty 2

## 2021-10-03 MED ORDER — BUPIVACAINE-EPINEPHRINE (PF) 0.25% -1:200000 IJ SOLN
INTRAMUSCULAR | Status: DC | PRN
  Administered 2021-10-03: 16 mL

## 2021-10-03 MED ORDER — VANCOMYCIN HCL IN DEXTROSE 1-5 GM/200ML-% IV SOLN
INTRAVENOUS | Status: AC
  Filled 2021-10-03: qty 200

## 2021-10-03 MED ORDER — LACTATED RINGERS IV SOLN: INTRAVENOUS | Status: DC

## 2021-10-03 MED ORDER — ACETAMINOPHEN 500 MG PO TABS
ORAL_TABLET | ORAL | Status: AC
  Filled 2021-10-03: qty 2

## 2021-10-03 MED ORDER — FENTANYL CITRATE (PF) 100 MCG/2ML IJ SOLN
INTRAMUSCULAR | Status: AC
  Filled 2021-10-03: qty 2

## 2021-10-03 MED ORDER — AMISULPRIDE (ANTIEMETIC) 5 MG/2ML IV SOLN: 10.0000 mg | Freq: Once | INTRAVENOUS | Status: DC | PRN

## 2021-10-03 MED ORDER — VANCOMYCIN HCL IN DEXTROSE 1-5 GM/200ML-% IV SOLN
1000.0000 mg | INTRAVENOUS | Status: AC
  Administered 2021-10-03: 1000 mg via INTRAVENOUS

## 2021-10-03 MED ORDER — OXYCODONE HCL 5 MG/5ML PO SOLN: 5.0000 mg | Freq: Once | ORAL | Status: DC | PRN

## 2021-10-03 MED ORDER — GABAPENTIN 300 MG PO CAPS: 300.0000 mg | ORAL_CAPSULE | ORAL | Status: DC

## 2021-10-03 MED ORDER — LIDOCAINE HCL (CARDIAC) PF 100 MG/5ML IV SOSY
PREFILLED_SYRINGE | INTRAVENOUS | Status: DC | PRN
  Administered 2021-10-03: 60 mg via INTRAVENOUS

## 2021-10-03 MED ORDER — KETOROLAC TROMETHAMINE 30 MG/ML IJ SOLN: 30.0000 mg | Freq: Once | INTRAMUSCULAR | Status: DC | PRN

## 2021-10-03 MED ORDER — EPHEDRINE SULFATE (PRESSORS) 50 MG/ML IJ SOLN
INTRAMUSCULAR | Status: DC | PRN
  Administered 2021-10-03 (×2): 10 mg via INTRAVENOUS

## 2021-10-03 MED ORDER — FENTANYL CITRATE (PF) 100 MCG/2ML IJ SOLN
INTRAMUSCULAR | Status: DC | PRN
  Administered 2021-10-03: 50 ug via INTRAVENOUS
  Administered 2021-10-03 (×2): 25 ug via INTRAVENOUS

## 2021-10-03 SURGICAL SUPPLY — 44 items
ADH SKN CLS APL DERMABOND .7 (GAUZE/BANDAGES/DRESSINGS) ×1
APL PRP STRL LF DISP 70% ISPRP (MISCELLANEOUS) ×1
APPLIER CLIP 9.375 MED OPEN (MISCELLANEOUS)
APR CLP MED 9.3 20 MLT OPN (MISCELLANEOUS)
BLADE SURG 15 STRL LF DISP TIS (BLADE) ×2 IMPLANT
BLADE SURG 15 STRL SS (BLADE) ×2
CANISTER SUC SOCK COL 7IN (MISCELLANEOUS) ×3 IMPLANT
CANISTER SUCT 1200ML W/VALVE (MISCELLANEOUS) ×3 IMPLANT
CHLORAPREP W/TINT 26 (MISCELLANEOUS) ×3 IMPLANT
CLIP APPLIE 9.375 MED OPEN (MISCELLANEOUS) IMPLANT
COVER BACK TABLE 60X90IN (DRAPES) ×3 IMPLANT
COVER MAYO STAND STRL (DRAPES) ×3 IMPLANT
COVER PROBE W GEL 5X96 (DRAPES) ×3 IMPLANT
DERMABOND ADVANCED (GAUZE/BANDAGES/DRESSINGS) ×1
DERMABOND ADVANCED .7 DNX12 (GAUZE/BANDAGES/DRESSINGS) ×2 IMPLANT
DRAPE LAPAROSCOPIC ABDOMINAL (DRAPES) ×3 IMPLANT
DRAPE UTILITY XL STRL (DRAPES) ×3 IMPLANT
ELECT COATED BLADE 2.86 ST (ELECTRODE) ×3 IMPLANT
ELECT REM PT RETURN 9FT ADLT (ELECTROSURGICAL) ×2
ELECTRODE REM PT RTRN 9FT ADLT (ELECTROSURGICAL) ×2 IMPLANT
GLOVE SURG ENC MOIS LTX SZ7.5 (GLOVE) ×6 IMPLANT
GLOVE SURG POLYISO LF SZ7 (GLOVE) ×1 IMPLANT
GLOVE SURG UNDER POLY LF SZ7 (GLOVE) ×1 IMPLANT
GOWN STRL REUS W/ TWL LRG LVL3 (GOWN DISPOSABLE) ×4 IMPLANT
GOWN STRL REUS W/TWL LRG LVL3 (GOWN DISPOSABLE) ×4
ILLUMINATOR WAVEGUIDE N/F (MISCELLANEOUS) IMPLANT
KIT MARKER MARGIN INK (KITS) ×3 IMPLANT
LIGHT WAVEGUIDE WIDE FLAT (MISCELLANEOUS) IMPLANT
NDL HYPO 25X1 1.5 SAFETY (NEEDLE) IMPLANT
NEEDLE HYPO 25X1 1.5 SAFETY (NEEDLE) IMPLANT
NS IRRIG 1000ML POUR BTL (IV SOLUTION) IMPLANT
PACK BASIN DAY SURGERY FS (CUSTOM PROCEDURE TRAY) ×3 IMPLANT
PENCIL SMOKE EVACUATOR (MISCELLANEOUS) ×3 IMPLANT
SLEEVE SCD COMPRESS KNEE MED (STOCKING) ×3 IMPLANT
SPIKE FLUID TRANSFER (MISCELLANEOUS) IMPLANT
SPONGE T-LAP 18X18 ~~LOC~~+RFID (SPONGE) ×3 IMPLANT
SUT MON AB 4-0 PC3 18 (SUTURE) ×3 IMPLANT
SUT SILK 2 0 SH (SUTURE) IMPLANT
SUT VICRYL 3-0 CR8 SH (SUTURE) ×3 IMPLANT
SYR CONTROL 10ML LL (SYRINGE) IMPLANT
TOWEL GREEN STERILE FF (TOWEL DISPOSABLE) ×3 IMPLANT
TRAY FAXITRON CT DISP (TRAY / TRAY PROCEDURE) ×3 IMPLANT
TUBE CONNECTING 20X1/4 (TUBING) ×3 IMPLANT
YANKAUER SUCT BULB TIP NO VENT (SUCTIONS) IMPLANT

## 2021-10-03 NOTE — Discharge Instructions (Signed)

## 2021-10-03 NOTE — Transfer of Care (Signed)
Immediate Anesthesia Transfer of Care Note ? ?Patient: [name] ? ?Procedure(s) Performed: LEFT BREAST LUMPECTOMY WITH RADIOACTIVE SEED LOCALIZATION (Left: Breast) ? ?Patient Location: PACU ? ?Anesthesia Type:General ? ?Level of Consciousness: sedated ? ?Airway & Oxygen Therapy: Patient Spontanous Breathing and Patient connected to face mask oxygen ? ?Post-op Assessment: Report given to RN and Post -op Vital signs reviewed and stable ? ?Post vital signs: Reviewed and stable ? ?Last Vitals:  ?Vitals Value Taken Time  ?BP 111/66 10/03/21 1349  ?Temp    ?Pulse 80 10/03/21 1352  ?Resp 11 10/03/21 1352  ?SpO2 97 % 10/03/21 1352  ?Vitals shown include unvalidated device data. ? ?Last Pain:  ?Vitals:  ? 10/03/21 1210  ?TempSrc: Oral  ?PainSc: 0-No pain  ?   ? ?Patients Stated Pain Goal: 3 (10/03/21 1210) ? ?Complications: No notable events documented. ?

## 2021-10-03 NOTE — H&P (Signed)
?REFERRING PHYSICIAN: Artis Delay, MD ? ?PROVIDER: Lindell Noe, MD ? ?MRN: [id number] ?DOB: 09-30-1965 ?Subjective  ? ?Chief Complaint: Breast Mass ? ? ?History of Present Illness: ?[name] is a 57 y.o. female who is seen today as an office consultation at the request of Dr. Bertis Ruddy for evaluation of Breast Mass ?.  ? ?We are asked to see the patient in consultation by Dr. Bertis Ruddy to evaluate her for a complex sclerosing lesion of the left breast. The patient is a 56 year old white female who initially was diagnosed with leukemia in August of last year. As part of her work-up she underwent genetic testing which revealed that she has a CH EK 2 mutation. As part of her evaluation she underwent breast MRI which showed an 8 mm mass in the lateral aspect of the left breast. This was biopsied and came back as a complex sclerosing lesion. She does have a significant family history for breast cancer in multiple family members. She is actively being treated for her leukemia. She does smoke about half a pack of cigarettes a day ? ?Review of Systems: ?A complete review of systems was obtained from the patient. I have reviewed this information and discussed as appropriate with the patient. See HPI as well for other ROS. ? ?ROS  ? ?Medical History: ?Past Medical History:  ?Diagnosis Date  ? Chronic myeloid leukemia (CMS-HCC)  ? ?Patient Active Problem List  ?Diagnosis  ? Complex sclerosing lesion of left breast  ? ?Past Surgical History:  ?Procedure Laterality Date  ? HYSTERECTOMY  ? TONSILLECTOMY  ? ? ?Allergies  ?Allergen Reactions  ? Bupropion Unknown  ? Piperacillin-Tazobactam Unknown  ? Sulfa (Sulfonamide Antibiotics) Swelling  ? ?Current Outpatient Medications on File Prior to Visit  ?Medication Sig Dispense Refill  ? dasatinib (SPRYCEL) 70 MG tablet Take by mouth  ? escitalopram oxalate (LEXAPRO) 20 MG tablet TAKE 1 AND 1/2 TABLET BY MOUTH EVERY DAY  ? ibuprofen (MOTRIN) 200 MG tablet Take by mouth  ? Lactobac  no.41/Bifidobact no.7 (PROBIOTIC-10 ORAL)  ? ?No current facility-administered medications on file prior to visit.  ? ?Family History  ?Problem Relation Age of Onset  ? Breast cancer Mother  ? Pancreatic cancer Father  ? Breast cancer Maternal Aunt  ? Bladder Cancer Paternal Uncle  ? Colon cancer Maternal Grandmother  ? ? ?Social History  ? ?Tobacco Use  ?Smoking Status Every Day  ? Packs/day: 1.00  ? Types: Cigarettes  ?Smokeless Tobacco Never  ? ? ?Social History  ? ?Socioeconomic History  ? Marital status: Single  ?Tobacco Use  ? Smoking status: Every Day  ?Packs/day: 1.00  ?Types: Cigarettes  ? Smokeless tobacco: Never  ?Substance and Sexual Activity  ? Alcohol use: Yes  ?Comment: 1 time per year  ? Drug use: Never  ? ?Objective:  ? ?Vitals:  ?BP: 104/64  ?Pulse: 72  ?Weight: 64.1 kg (141 lb 6.4 oz)  ?Height: 162.6 cm (5\' 4" )  ? ?Body mass index is 24.27 kg/m?. ? ?Physical Exam ?Vitals reviewed.  ?Constitutional:  ?General: She is not in acute distress. ?Appearance: Normal appearance.  ?HENT:  ?Head: Normocephalic and atraumatic.  ?Right Ear: External ear normal.  ?Left Ear: External ear normal.  ?Nose: Nose normal.  ?Mouth/Throat:  ?Mouth: Mucous membranes are moist.  ?Pharynx: Oropharynx is clear.  ?Eyes:  ?General: No scleral icterus. ?Extraocular Movements: Extraocular movements intact.  ?Conjunctiva/sclera: Conjunctivae normal.  ?Pupils: Pupils are equal, round, and reactive to light.  ?Cardiovascular:  ?Rate and Rhythm: Normal  rate and regular rhythm.  ?Pulses: Normal pulses.  ?Heart sounds: Normal heart sounds.  ?Pulmonary:  ?Effort: Pulmonary effort is normal. No respiratory distress.  ?Breath sounds: Normal breath sounds.  ?Abdominal:  ?General: Bowel sounds are normal.  ?Palpations: Abdomen is soft.  ?Tenderness: There is no abdominal tenderness.  ?Musculoskeletal:  ?General: No swelling, tenderness or deformity. Normal range of motion.  ?Cervical back: Normal range of motion and neck supple.   ?Skin: ?General: Skin is warm and dry.  ?Coloration: Skin is not jaundiced.  ?Neurological:  ?General: No focal deficit present.  ?Mental Status: She is alert and oriented to person, place, and time.  ?Psychiatric:  ?Mood and Affect: Mood normal.  ?Behavior: Behavior normal.  ? ? ? ?Breast: There is no palpable mass in either breast. There is 1 very small palpable mobile lymph node in the left axilla. There is no other palpable lymphadenopathy ? ?Labs, Imaging and Diagnostic Testing: ? ?Assessment and Plan:  ? ?Diagnoses and all orders for this visit: ? ?Complex sclerosing lesion of left breast ?- [facility] Case Posting Request; Future ? ? ? ?The patient appears to have an 8 mm area of complex sclerosing lesion in the lateral left breast. Because of its abnormal appearance and because there is a 5 to 10% chance of missing something more significant and with her family history of breast cancer the recommendation would be to have this area removed. She would also like to have this done. I have discussed with her in detail the risks and benefits of the operation as well as some of the technical aspects including the use of a radioactive seed for localization and she understands and wishes to proceed. I suspect that her breast cancer risk will be driven more by the [facility] EK 2 mutation. She will continue to follow-up with her medical oncologist for this and her leukemia  ?

## 2021-10-03 NOTE — Anesthesia Preprocedure Evaluation (Addendum)
Anesthesia Evaluation  ?Patient identified by MRN, date of birth, ID band ?Patient awake ? ? ? ?Reviewed: ?Allergy & Precautions, NPO status , Patient's Chart, lab work & pertinent test results ? ?Airway ?Mallampati: II ? ?TM Distance: >3 FB ?Neck ROM: Full ? ? ? Dental ?no notable dental hx. ? ?  ?Pulmonary ?Current Smoker and Patient abstained from smoking.,  ?  ?Pulmonary exam normal ? ? ? ? ? ? ? Cardiovascular ?negative cardio ROS ?Normal cardiovascular exam ? ?ECG: NSR, rate 71 ?  ?Neuro/Psych ?Anxiety negative neurological ROS ?   ? GI/Hepatic ?negative GI ROS, Neg liver ROS,   ?Endo/Other  ?CML (chronic myeloid leukemia) ? Renal/GU ?negative Renal ROS  ? ?  ?Musculoskeletal ?negative musculoskeletal ROS ?(+)  ? Abdominal ?  ?Peds ? Hematology ?negative hematology ROS ?(+)   ?Anesthesia Other Findings ?LEFT BREAST COMPLEX SCLEROSING LESION ? Reproductive/Obstetrics ? ?  ? ? ? ? ? ? ? ? ? ? ? ? ? ?  ?  ? ? ? ? ? ? ? ?Anesthesia Physical ?Anesthesia Plan ? ?ASA: 2 ? ?Anesthesia Plan: General  ? ?Post-op Pain Management:   ? ?Induction: Intravenous ? ?PONV Risk Score and Plan: 2 and Ondansetron, Dexamethasone and Treatment may vary due to age or medical condition ? ?Airway Management Planned: LMA ? ?Additional Equipment:  ? ?Intra-op Plan:  ? ?Post-operative Plan: Extubation in OR ? ?Informed Consent: I have reviewed the patients History and Physical, chart, labs and discussed the procedure including the risks, benefits and alternatives for the proposed anesthesia with the patient or authorized representative who has indicated his/her understanding and acceptance.  ? ? ? ?Dental advisory given ? ?Plan Discussed with: CRNA ? ?Anesthesia Plan Comments:   ? ? ? ? ? ? ?Anesthesia Quick Evaluation ? ?

## 2021-10-03 NOTE — Anesthesia Procedure Notes (Signed)
Procedure Name: LMA Insertion ?Date/Time: 10/03/2021 3:05 AM ?Performed by: Maryella Shivers, CRNA ?Pre-anesthesia Checklist: Patient identified, Emergency Drugs available, Suction available and Patient being monitored ?Patient Re-evaluated:Patient Re-evaluated prior to induction ?Oxygen Delivery Method: Circle system utilized ?Preoxygenation: Pre-oxygenation with 100% oxygen ?Induction Type: IV induction ?Ventilation: Mask ventilation without difficulty ?LMA: LMA inserted ?LMA Size: 4.0 ?Number of attempts: 1 ?Airway Equipment and Method: Bite block ?Placement Confirmation: positive ETCO2 ?Tube secured with: Tape ?Dental Injury: Teeth and Oropharynx as per pre-operative assessment  ? ? ? ? ?

## 2021-10-03 NOTE — Op Note (Signed)
10/03/2021 ? ?1:35 PM ? ?PATIENT:  [name]  56 y.o. female ? ?PRE-OPERATIVE DIAGNOSIS:  LEFT BREAST COMPLEX SCLEROSING LESION ? ?POST-OPERATIVE DIAGNOSIS:  LEFT BREAST COMPLEX SCLEROSING LESION ? ?PROCEDURE:  Procedure(s): ?LEFT BREAST LUMPECTOMY WITH RADIOACTIVE SEED LOCALIZATION (Left) ? ?SURGEON:  Surgeon(s) and Role: ?   Jovita Kussmaul, MD - Primary ? ?PHYSICIAN ASSISTANT:  ? ?ASSISTANTS: none  ? ?ANESTHESIA:   local and general ? ?EBL:  minimal  ? ?BLOOD ADMINISTERED:none ? ?DRAINS: none  ? ?LOCAL MEDICATIONS USED:  MARCAINE    ? ?SPECIMEN:  Source of Specimen:  left breast tissue ? ?DISPOSITION OF SPECIMEN:  PATHOLOGY ? ?COUNTS:  YES ? ?TOURNIQUET:  * No tourniquets in log * ? ?DICTATION: .Dragon Dictation ? ?After informed consent was obtained the patient was brought to the operating room and placed in the supine position on the operating table.  After adequate induction of general anesthesia the patient's left breast was prepped with ChloraPrep, allowed to dry, and draped in usual sterile manner.  An appropriate timeout was performed.  Previously an I-125 seed was placed in the outer aspect of the left breast to mark an area of complex sclerosing lesion.  The neoprobe was set to I-125 in the area of radioactivity was readily identified.  The area around this was infiltrated with quarter percent Marcaine.  A curvilinear incision was made along the lower outer edge of the areola of the left breast with a 15 blade knife.  The incision was carried through the skin and subcutaneous tissue sharply with the electrocautery.  Dissection was then carried towards the radioactive seed under the direction of the neoprobe.  Once I more closely approached the radioactive seed I then removed a circular portion of breast tissue sharply with the electrocautery around the radioactive seed while checking the area of radioactivity frequently.  Once the specimen was removed it was oriented with the appropriate paint  colors.  A specimen radiograph was obtained that showed the clip and seed to be within the specimen.  The specimen was then sent to pathology for further evaluation.  Hemostasis was achieved using the Bovie electrocautery.  The wound was irrigated with saline and infiltrated with more quarter percent Marcaine.  The deep layer of the incision was then closed with interrupted 3-0 Vicryl stitches.  The skin was then closed with interrupted 4-0 Monocryl subcuticular stitches.  Dermabond dressings were applied.  The patient tolerated the procedure well.  At the end of the case all needle sponge and instrument counts were correct.  The patient was then awakened and taken to recovery in stable condition. ? ?PLAN OF CARE: Discharge to home after PACU ? ?PATIENT DISPOSITION:  PACU - hemodynamically stable. ?  ?Delay start of Pharmacological VTE agent (>24hrs) due to surgical blood loss or risk of bleeding: not applicable ? ?

## 2021-10-03 NOTE — Anesthesia Postprocedure Evaluation (Signed)
Anesthesia Post Note ? ?Patient: [name] ? ?Procedure(s) Performed: LEFT BREAST LUMPECTOMY WITH RADIOACTIVE SEED LOCALIZATION (Left: Breast) ? ?  ? ?Patient location during evaluation: PACU ?Anesthesia Type: General ?Level of consciousness: awake ?Pain management: pain level controlled ?Vital Signs Assessment: post-procedure vital signs reviewed and stable ?Respiratory status: spontaneous breathing, nonlabored ventilation, respiratory function stable and patient connected to nasal cannula oxygen ?Cardiovascular status: blood pressure returned to baseline and stable ?Postop Assessment: no apparent nausea or vomiting ?Anesthetic complications: no ? ? ?No notable events documented. ? ?Last Vitals:  ?Vitals:  ? 10/03/21 1430 10/03/21 1508  ?BP: 96/61 115/78  ?Pulse: 71 78  ?Resp: 13 16  ?Temp:  36.6 ?C  ?SpO2: 93% 96%  ?  ?Last Pain:  ?Vitals:  ? 10/03/21 1508  ?TempSrc:   ?PainSc: 0-No pain  ? ? ?  ?  ?  ?  ?  ?  ? ?Jewelz Kobus P Cristella Stiver ? ? ? ? ?

## 2021-10-03 NOTE — Interval H&P Note (Signed)
History and Physical Interval Note: ? ?10/03/2021 ?12:39 PM ? ?[name]  has presented today for surgery, with the diagnosis of LEFT BREAST COMPLEX SCLEROSING LESION.  The various methods of treatment have been discussed with the patient and family. After consideration of risks, benefits and other options for treatment, the patient has consented to  Procedure(s): ?LEFT BREAST LUMPECTOMY WITH RADIOACTIVE SEED LOCALIZATION (Left) as a surgical intervention.  The patient's history has been reviewed, patient examined, no change in status, stable for surgery.  I have reviewed the patient's chart and labs.  Questions were answered to the patient's satisfaction.   ? ? ?Autumn Messing III ? ? ?

## 2021-10-04 ENCOUNTER — Encounter (HOSPITAL_BASED_OUTPATIENT_CLINIC_OR_DEPARTMENT_OTHER): Payer: Self-pay | Admitting: General Surgery

## 2021-10-04 NOTE — Progress Notes (Signed)
Mailbox full

## 2021-10-07 ENCOUNTER — Other Ambulatory Visit: Payer: Self-pay

## 2021-10-07 ENCOUNTER — Inpatient Hospital Stay: Payer: BC Managed Care – PPO | Attending: Hematology and Oncology

## 2021-10-07 ENCOUNTER — Other Ambulatory Visit (HOSPITAL_COMMUNITY): Payer: Self-pay

## 2021-10-07 DIAGNOSIS — C9210 Chronic myeloid leukemia, BCR/ABL-positive, not having achieved remission: Secondary | ICD-10-CM | POA: Insufficient documentation

## 2021-10-07 DIAGNOSIS — Z79899 Other long term (current) drug therapy: Secondary | ICD-10-CM | POA: Diagnosis not present

## 2021-10-07 DIAGNOSIS — Z1509 Genetic susceptibility to other malignant neoplasm: Secondary | ICD-10-CM | POA: Diagnosis not present

## 2021-10-07 DIAGNOSIS — L0390 Cellulitis, unspecified: Secondary | ICD-10-CM | POA: Insufficient documentation

## 2021-10-07 DIAGNOSIS — F17210 Nicotine dependence, cigarettes, uncomplicated: Secondary | ICD-10-CM | POA: Insufficient documentation

## 2021-10-07 LAB — COMPREHENSIVE METABOLIC PANEL WITH GFR
ALT: 14 U/L (ref 0–44)
AST: 16 U/L (ref 15–41)
Albumin: 4.2 g/dL (ref 3.5–5.0)
Alkaline Phosphatase: 78 U/L (ref 38–126)
Anion gap: 6 (ref 5–15)
BUN: 14 mg/dL (ref 6–20)
CO2: 26 mmol/L (ref 22–32)
Calcium: 9.2 mg/dL (ref 8.9–10.3)
Chloride: 109 mmol/L (ref 98–111)
Creatinine, Ser: 1.06 mg/dL — ABNORMAL HIGH (ref 0.44–1.00)
GFR, Estimated: >60 mL/min
Glucose, Bld: 112 mg/dL — ABNORMAL HIGH (ref 70–99)
Potassium: 3.6 mmol/L (ref 3.5–5.1)
Sodium: 141 mmol/L (ref 135–145)
Total Bilirubin: 0.3 mg/dL (ref 0.3–1.2)
Total Protein: 6.6 g/dL (ref 6.5–8.1)

## 2021-10-07 LAB — CBC WITH DIFFERENTIAL/PLATELET
Abs Immature Granulocytes: 0.01 10*3/uL (ref 0.00–0.07)
Basophils Absolute: 0 10*3/uL (ref 0.0–0.1)
Basophils Relative: 1 %
Eosinophils Absolute: 0.9 10*3/uL — ABNORMAL HIGH (ref 0.0–0.5)
Eosinophils Relative: 14 %
HCT: 36.3 % (ref 36.0–46.0)
Hemoglobin: 12.8 g/dL (ref 12.0–15.0)
Immature Granulocytes: 0 %
Lymphocytes Relative: 33 %
Lymphs Abs: 2.3 10*3/uL (ref 0.7–4.0)
MCH: 33.7 pg (ref 26.0–34.0)
MCHC: 35.3 g/dL (ref 30.0–36.0)
MCV: 95.5 fL (ref 80.0–100.0)
Monocytes Absolute: 0.4 10*3/uL (ref 0.1–1.0)
Monocytes Relative: 5 %
Neutro Abs: 3.2 10*3/uL (ref 1.7–7.7)
Neutrophils Relative %: 47 %
Platelets: 245 10*3/uL (ref 150–400)
RBC: 3.8 MIL/uL — ABNORMAL LOW (ref 3.87–5.11)
RDW: 11.6 % (ref 11.5–15.5)
WBC: 6.8 10*3/uL (ref 4.0–10.5)
nRBC: 0 % (ref 0.0–0.2)

## 2021-10-07 LAB — SURGICAL PATHOLOGY

## 2021-10-09 ENCOUNTER — Other Ambulatory Visit (HOSPITAL_COMMUNITY): Payer: Self-pay

## 2021-10-10 ENCOUNTER — Other Ambulatory Visit (HOSPITAL_COMMUNITY): Payer: Self-pay

## 2021-10-10 MED ORDER — DOXYCYCLINE MONOHYDRATE 100 MG PO CAPS
100.0000 mg | ORAL_CAPSULE | Freq: Two times a day (BID) | ORAL | 0 refills | Status: DC
  Filled 2021-10-10 – 2021-10-14 (×2): qty 14, 7d supply, fill #0

## 2021-10-14 ENCOUNTER — Other Ambulatory Visit (HOSPITAL_COMMUNITY): Payer: Self-pay

## 2021-10-17 ENCOUNTER — Encounter: Payer: Self-pay | Admitting: Hematology and Oncology

## 2021-10-17 ENCOUNTER — Other Ambulatory Visit: Payer: Self-pay | Admitting: Hematology and Oncology

## 2021-10-17 ENCOUNTER — Inpatient Hospital Stay (HOSPITAL_BASED_OUTPATIENT_CLINIC_OR_DEPARTMENT_OTHER): Payer: BC Managed Care – PPO | Admitting: Hematology and Oncology

## 2021-10-17 ENCOUNTER — Other Ambulatory Visit (HOSPITAL_COMMUNITY): Payer: Self-pay

## 2021-10-17 ENCOUNTER — Other Ambulatory Visit: Payer: Self-pay

## 2021-10-17 ENCOUNTER — Telehealth: Payer: Self-pay

## 2021-10-17 VITALS — BP 106/68 | HR 81 | Temp 97.7°F | Resp 18 | Ht 64.0 in | Wt 141.4 lb

## 2021-10-17 DIAGNOSIS — Z1501 Genetic susceptibility to malignant neoplasm of breast: Secondary | ICD-10-CM | POA: Diagnosis not present

## 2021-10-17 DIAGNOSIS — C9210 Chronic myeloid leukemia, BCR/ABL-positive, not having achieved remission: Secondary | ICD-10-CM

## 2021-10-17 DIAGNOSIS — Z1502 Genetic susceptibility to malignant neoplasm of ovary: Secondary | ICD-10-CM

## 2021-10-17 DIAGNOSIS — F17210 Nicotine dependence, cigarettes, uncomplicated: Secondary | ICD-10-CM

## 2021-10-17 DIAGNOSIS — M8580 Other specified disorders of bone density and structure, unspecified site: Secondary | ICD-10-CM | POA: Diagnosis not present

## 2021-10-17 DIAGNOSIS — Z1589 Genetic susceptibility to other disease: Secondary | ICD-10-CM

## 2021-10-17 DIAGNOSIS — Z780 Asymptomatic menopausal state: Secondary | ICD-10-CM

## 2021-10-17 DIAGNOSIS — Z1509 Genetic susceptibility to other malignant neoplasm: Secondary | ICD-10-CM

## 2021-10-17 NOTE — Assessment & Plan Note (Signed)
We discussed the importance of nicotine cessation She will try her best to quit smoking 

## 2021-10-17 NOTE — Telephone Encounter (Signed)
Called and attempted to leave a message with bone density appt. ?

## 2021-10-17 NOTE — Telephone Encounter (Signed)
Called back and given appt details for bone density on 4/4. Told her I sent a mychart message with details. Remind her no zippers below waist. She verbalized understanding. ?

## 2021-10-17 NOTE — Progress Notes (Signed)
[place] ?OFFICE PROGRESS NOTE ? ?Patient Care Team: ?Merrilee Seashore, MD as PCP - General (Internal Medicine) ? ?ASSESSMENT & PLAN:  ?CML (chronic myeloid leukemia) ([place]) ?I have reviewed recent test results with the patient ?She continues to have positive response to treatment ?For now, I recommend we continue on Sprycel at 70 mg daily ?We will recheck blood work again in a month ?If she is not able to reach major molecular response in 3 months, I plan to increase Dasatinib to 100 mg daily ?She is in agreement ? ?Continuous dependence on cigarette smoking ?We discussed the importance of nicotine cessation ?She will try her best to quit smoking ? ?Monoallelic mutation of CHEK2 gene in female patient ?She underwent lumpectomy recently, complicated by cellulitis ?Her cellulitis is improving ?I reviewed the pathology report which show no evidence of malignancy ?We discussed the risk and benefits of antiestrogen therapy due to increased risk of breast cancer given her genetic condition ?I recommend getting baseline bone density scan for review ?I will discuss with her again next month for further follow-up and plan of care ? ?No orders of the defined types were placed in this encounter. ? ? ?All questions were answered. The patient knows to call the clinic with any problems, questions or concerns. ?The total time spent in the appointment was 30 minutes encounter with patients including review of chart and various tests results, discussions about plan of care and coordination of care plan ?  ?[name], MD ?10/17/2021 1:35 PM ? ?INTERVAL HISTORY: ?Please see below for problem oriented charting. ?she returns for treatment follow-up on Sprycel for CML ?She has completed lumpectomy approximately 2 weeks ago for abnormal mammogram ?Her treatment course is complicated by cellulitis ?She is completing a second course of oral antibiotics ?We discussed the role of chemoprevention; the patient is  contemplating bilateral mastectomy ?Unfortunately, she is still smoking ?She has no side effects from Sprycel such as shortness of breath or fluid retention ? ?REVIEW OF SYSTEMS:   ?Constitutional: Denies fevers, chills or abnormal weight loss ?Eyes: Denies blurriness of vision ?Ears, nose, mouth, throat, and face: Denies mucositis or sore throat ?Respiratory: Denies cough, dyspnea or wheezes ?Cardiovascular: Denies palpitation, chest discomfort or lower extremity swelling ?Gastrointestinal:  Denies nausea, heartburn or change in bowel habits ?Lymphatics: Denies new lymphadenopathy or easy bruising ?Neurological:Denies numbness, tingling or new weaknesses ?Behavioral/Psych: Mood is stable, no new changes  ?All other systems were reviewed with the patient and are negative. ? ?I have reviewed the past medical history, past surgical history, social history and family history with the patient and they are unchanged from previous note. ? ?ALLERGIES:  is allergic to other, sulfa antibiotics, wellbutrin [bupropion], and piperacillin sod-tazobactam so. ? ?MEDICATIONS:  ?Current Outpatient Medications  ?Medication Sig Dispense Refill  ? acetaminophen (TYLENOL) 325 MG tablet Take 325 mg by mouth every 6 (six) hours as needed (pain).     ? cholestyramine (QUESTRAN) 4 GM/DOSE powder Take 4 g by mouth daily.    ? clonazePAM (KLONOPIN) 0.5 MG tablet Take 0.5 mg by mouth 2 (two) times daily as needed for anxiety.    ? dasatinib (SPRYCEL) 70 MG tablet Take 1 tablet (70 mg total) by mouth daily. 30 tablet 11  ? doxycycline (MONODOX) 100 MG capsule Take 1 capsule (100 mg total) by mouth 2 (two) times daily for 7 days 14 capsule 0  ? escitalopram (LEXAPRO) 20 MG tablet Take 30 mg by mouth daily.    ? ibuprofen (ADVIL)  200 MG tablet Take 200 mg by mouth every 6 (six) hours as needed for moderate pain.     ? Lactobacillus (PROBIOTIC ACIDOPHILUS PO) Take 1 capsule by mouth daily.    ? loperamide (IMODIUM) 2 MG capsule Take 2 mg by mouth  daily as needed for diarrhea or loose stools.    ? LORazepam (ATIVAN) 1 MG tablet Take 1 tablet (1 mg total) by mouth 2 (two) times daily as needed for anxiety. 10 tablet 0  ? metaxalone (SKELAXIN) 400 MG tablet Take 1 tablet (400 mg total) by mouth 3 (three) times daily as needed for muscle spasms. 15 tablet 0  ? Multiple Vitamin (MULTIVITAMIN WITH MINERALS) TABS tablet Take 1 tablet by mouth daily.    ? ondansetron (ZOFRAN) 4 MG tablet Take 1 tablet (4 mg total) by mouth every 8 (eight) hours as needed for nausea or vomiting. 30 tablet 0  ? oxyCODONE (ROXICODONE) 5 MG immediate release tablet Take 1 tablet (5 mg total) by mouth every 6 (six) hours as needed for severe pain. 10 tablet 0  ? ?No current facility-administered medications for this visit.  ? ? ?SUMMARY OF ONCOLOGIC HISTORY: ?Oncology History  ?CML (chronic myeloid leukemia) ([place])  ?03/01/2021 Initial Diagnosis  ? CML (chronic myeloid leukemia) ([place]) ?  ?03/01/2021 Pathology Results  ? BCR/ABL by FISH: 91% positive ?BRC/ABL by PCR: e13a2 (b2a2) by IS is 196.685% ?  ?04/25/2021 -  Chemotherapy  ? She started taking imatinib ?  ?05/15/2021 Pathology Results  ? BRC/ABL is detected by PCR: e13a2 (b2a2) by IS is 70.731% ?  ?06/17/2021 Pathology Results  ? e13a2 (b2a2) by [employer]idex (IS): 39.7401% ?  ?07/24/2021 Pathology Results  ? e13a2 (b2a2) by [employer]ide[employer] (IS): 33.3% ?  ?08/30/2021 Pathology Results  ? e13a2 (b2a2) by [employer] (IS): 9.8004% ?  ? ? ?PHYSICAL EXAMINATION: ?ECOG PERFORMANCE STATUS: 1 - Symptomatic but completely ambulatory ? ?Vitals:  ? 10/17/21 1131  ?BP: 106/68  ?Pulse: 81  ?Resp: 18  ?Temp: 97.7 ?F (36.5 ?C)  ?SpO2: 100%  ? ?Filed Weights  ? 10/17/21 1131  ?Weight: 141 lb 6.4 oz (64.1 kg)  ? ? ?GENERAL:alert, no distress and comfortable ?NEURO: alert & oriented x 3 with fluent speech, no focal motor/sensory deficits ? ?LABORATORY DATA:  ?I have reviewed the data as listed ?   ?Component Value Date/Time  ? NA 141 10/07/2021 1227  ? K 3.6  10/07/2021 1227  ? CL 109 10/07/2021 1227  ? CO2 26 10/07/2021 1227  ? GLUCOSE 112 (H) 10/07/2021 1227  ? BUN 14 10/07/2021 1227  ? CREATININE 1.06 (H) 10/07/2021 1227  ? CALCIUM 9.2 10/07/2021 1227  ? PROT 6.6 10/07/2021 1227  ? ALBUMIN 4.2 10/07/2021 1227  ? AST 16 10/07/2021 1227  ? ALT 14 10/07/2021 1227  ? ALKPHOS 78 10/07/2021 1227  ? BILITOT 0.3 10/07/2021 1227  ? GFRNONAA >60 10/07/2021 1227  ? GFRAA >60 10/21/2019 1020  ? ? ?No results found for: SPEP, UPEP ? ?Lab Results  ?Component Value Date  ? WBC 6.8 10/07/2021  ? NEUTROABS 3.2 10/07/2021  ? HGB 12.8 10/07/2021  ? HCT 36.3 10/07/2021  ? MCV 95.5 10/07/2021  ? PLT 245 10/07/2021  ? ? ?  Chemistry   ?   ?Component Value Date/Time  ? NA 141 10/07/2021 1227  ? K 3.6 10/07/2021 1227  ? CL 109 10/07/2021 1227  ? CO2 26 10/07/2021 1227  ? BUN 14 10/07/2021 1227  ? CREATININE 1.06 (H) 10/07/2021 1227  ?    ?  Component Value Date/Time  ? CALCIUM 9.2 10/07/2021 1227  ? ALKPHOS 78 10/07/2021 1227  ? AST 16 10/07/2021 1227  ? ALT 14 10/07/2021 1227  ? BILITOT 0.3 10/07/2021 1227  ?  ? ? ? ?RADIOGRAPHIC STUDIES: ?I have personally reviewed the radiological images as listed and agreed with the findings in the report. ?MM Breast Surgical Specimen ? ?Result Date: 10/03/2021 ?CLINICAL DATA:  Post lumpectomy specimen radiograph EXAM: SPECIMEN RADIOGRAPH OF THE LEFT BREAST COMPARISON:  Previous exam(s). FINDINGS: Status post excision of the left breast. The radioactive seed and biopsy marker clip are present, completely intact, and were marked for pathology. IMPRESSION: Specimen radiograph of the left breast. These results were called by telephone at the time of interpretation on 10/03/2021 at 1:28 pm to provider Autumn Messing III , who verbally acknowledged these results. Electronically Signed   By: Audie Pinto M.D.   On: 10/03/2021 13:28 ? ?MM LT RADIOACTIVE SEED LOC MAMMO GUIDE ? ?Result Date: 10/02/2021 ?CLINICAL DATA:  Patient with left breast complex sclerosing  lesion, for preoperative localization. EXAM: MAMMOGRAPHIC GUIDED RADIOACTIVE SEED LOCALIZATION OF THE LEFT BREAST COMPARISON:  Previous exam(s). FINDINGS: Patient presents for radioactive seed localization prior to left breas

## 2021-10-17 NOTE — Assessment & Plan Note (Signed)
She underwent lumpectomy recently, complicated by cellulitis ?Her cellulitis is improving ?I reviewed the pathology report which show no evidence of malignancy ?We discussed the risk and benefits of antiestrogen therapy due to increased risk of breast cancer given her genetic condition ?I recommend getting baseline bone density scan for review ?I will discuss with her again next month for further follow-up and plan of care ?

## 2021-10-17 NOTE — Assessment & Plan Note (Signed)
I have reviewed recent test results with the patient ?She continues to have positive response to treatment ?For now, I recommend we continue on Sprycel at 70 mg daily ?We will recheck blood work again in a month ?If she is not able to reach major molecular response in 3 months, I plan to increase Dasatinib to 100 mg daily ?She is in agreement ?

## 2021-10-21 ENCOUNTER — Other Ambulatory Visit (HOSPITAL_COMMUNITY): Payer: Self-pay

## 2021-10-22 ENCOUNTER — Ambulatory Visit
Admission: RE | Admit: 2021-10-22 | Discharge: 2021-10-22 | Disposition: A | Discharge status: Home / Self Care | Payer: BC Managed Care – PPO | Source: Ambulatory Visit | Attending: Hematology and Oncology | Admitting: Hematology and Oncology

## 2021-10-22 DIAGNOSIS — M8580 Other specified disorders of bone density and structure, unspecified site: Secondary | ICD-10-CM

## 2021-10-23 ENCOUNTER — Encounter (HOSPITAL_COMMUNITY): Payer: Self-pay

## 2021-10-25 ENCOUNTER — Other Ambulatory Visit (HOSPITAL_COMMUNITY): Payer: Self-pay

## 2021-10-31 ENCOUNTER — Other Ambulatory Visit (HOSPITAL_COMMUNITY): Payer: Self-pay

## 2021-11-04 ENCOUNTER — Other Ambulatory Visit (HOSPITAL_COMMUNITY): Payer: Self-pay

## 2021-11-05 ENCOUNTER — Encounter: Payer: Self-pay | Admitting: Hematology and Oncology

## 2021-11-05 ENCOUNTER — Other Ambulatory Visit (HOSPITAL_COMMUNITY): Payer: Self-pay

## 2021-11-05 NOTE — Progress Notes (Signed)
Faxed referral to [facility] for Dr. Myer Peer to [phone], received fax confirmation. ?

## 2021-11-08 ENCOUNTER — Other Ambulatory Visit (HOSPITAL_COMMUNITY): Payer: Self-pay

## 2021-11-09 ENCOUNTER — Other Ambulatory Visit (HOSPITAL_COMMUNITY): Payer: Self-pay

## 2021-11-09 ENCOUNTER — Other Ambulatory Visit: Payer: Self-pay

## 2021-11-11 ENCOUNTER — Inpatient Hospital Stay: Payer: BC Managed Care – PPO | Attending: Hematology and Oncology

## 2021-11-11 ENCOUNTER — Other Ambulatory Visit: Payer: Self-pay

## 2021-11-11 DIAGNOSIS — C9210 Chronic myeloid leukemia, BCR/ABL-positive, not having achieved remission: Secondary | ICD-10-CM | POA: Diagnosis not present

## 2021-11-11 LAB — COMPREHENSIVE METABOLIC PANEL
ALT: 19 U/L (ref 0–44)
AST: 23 U/L (ref 15–41)
Albumin: 4.3 g/dL (ref 3.5–5.0)
Alkaline Phosphatase: 98 U/L (ref 38–126)
Anion gap: 6 (ref 5–15)
BUN: 16 mg/dL (ref 6–20)
CO2: 27 mmol/L (ref 22–32)
Calcium: 9.3 mg/dL (ref 8.9–10.3)
Chloride: 108 mmol/L (ref 98–111)
Creatinine, Ser: 0.93 mg/dL (ref 0.44–1.00)
GFR, Estimated: >60 mL/min (ref >60)
Glucose, Bld: 117 mg/dL — ABNORMAL HIGH (ref 70–99)
Potassium: 3.6 mmol/L (ref 3.5–5.1)
Sodium: 141 mmol/L (ref 135–145)
Total Bilirubin: 0.4 mg/dL (ref 0.3–1.2)
Total Protein: 6.6 g/dL (ref 6.5–8.1)

## 2021-11-11 LAB — CBC WITH DIFFERENTIAL/PLATELET
Abs Immature Granulocytes: 0.01 10*3/uL (ref 0.00–0.07)
Basophils Absolute: 0.1 10*3/uL (ref 0.0–0.1)
Basophils Relative: 1 %
Eosinophils Absolute: 0.5 10*3/uL (ref 0.0–0.5)
Eosinophils Relative: 8 %
HCT: 38.1 % (ref 36.0–46.0)
Hemoglobin: 13.4 g/dL (ref 12.0–15.0)
Immature Granulocytes: 0 %
Lymphocytes Relative: 33 %
Lymphs Abs: 2.2 10*3/uL (ref 0.7–4.0)
MCH: 33.3 pg (ref 26.0–34.0)
MCHC: 35.2 g/dL (ref 30.0–36.0)
MCV: 94.8 fL (ref 80.0–100.0)
Monocytes Absolute: 0.4 10*3/uL (ref 0.1–1.0)
Monocytes Relative: 6 %
Neutro Abs: 3.4 10*3/uL (ref 1.7–7.7)
Neutrophils Relative %: 52 %
Platelets: 257 10*3/uL (ref 150–400)
RBC: 4.02 MIL/uL (ref 3.87–5.11)
RDW: 11.8 % (ref 11.5–15.5)
WBC: 6.6 10*3/uL (ref 4.0–10.5)
nRBC: 0 % (ref 0.0–0.2)

## 2021-11-14 ENCOUNTER — Other Ambulatory Visit (HOSPITAL_COMMUNITY): Payer: Self-pay

## 2021-11-18 ENCOUNTER — Encounter: Payer: Self-pay | Admitting: Hematology and Oncology

## 2021-11-18 ENCOUNTER — Other Ambulatory Visit: Payer: Self-pay

## 2021-11-18 ENCOUNTER — Inpatient Hospital Stay: Payer: BC Managed Care – PPO | Attending: Hematology and Oncology | Admitting: Hematology and Oncology

## 2021-11-18 VITALS — BP 106/65 | HR 80 | Temp 97.7°F | Resp 18 | Ht 64.0 in | Wt 145.6 lb

## 2021-11-18 DIAGNOSIS — C9210 Chronic myeloid leukemia, BCR/ABL-positive, not having achieved remission: Secondary | ICD-10-CM | POA: Insufficient documentation

## 2021-11-18 DIAGNOSIS — Z1502 Genetic susceptibility to malignant neoplasm of ovary: Secondary | ICD-10-CM

## 2021-11-18 DIAGNOSIS — F17210 Nicotine dependence, cigarettes, uncomplicated: Secondary | ICD-10-CM | POA: Insufficient documentation

## 2021-11-18 DIAGNOSIS — Z780 Asymptomatic menopausal state: Secondary | ICD-10-CM | POA: Diagnosis not present

## 2021-11-18 DIAGNOSIS — Z1509 Genetic susceptibility to other malignant neoplasm: Secondary | ICD-10-CM

## 2021-11-18 DIAGNOSIS — Z1501 Genetic susceptibility to malignant neoplasm of breast: Secondary | ICD-10-CM | POA: Diagnosis not present

## 2021-11-18 DIAGNOSIS — F32A Depression, unspecified: Secondary | ICD-10-CM | POA: Insufficient documentation

## 2021-11-18 DIAGNOSIS — Z1589 Genetic susceptibility to other disease: Secondary | ICD-10-CM

## 2021-11-18 DIAGNOSIS — M810 Age-related osteoporosis without current pathological fracture: Secondary | ICD-10-CM | POA: Insufficient documentation

## 2021-11-18 LAB — BCR/ABL

## 2021-11-18 NOTE — Assessment & Plan Note (Signed)
She tolerated Sprycel very well ?At the time of dictation, results of her recent test reviewed improvement of CML control but not at major molecular response ?I have attempted to call the patient twice but was not able to get hold of her ?My recommendation would be to continue on current dose of treatment and we will continue close monitoring monthly until she has reached MMR ?

## 2021-11-18 NOTE — Assessment & Plan Note (Signed)
I have reviewed recent bone density results with the patient ?She has osteoporosis measured by bone density in her forearm and mild osteopenia elsewhere ?We discussed the role of calcium with vitamin D supplement ?We also discussed importance of weightbearing exercises as tolerated ?If she is in agreement, I think Evista would be a good choice to help with osteoporosis and reduce risk of breast cancer ?

## 2021-11-18 NOTE — Assessment & Plan Note (Signed)
She is depressed but not suicidal ?She has difficulties dealing with her health issues and multiple different stress from family ?I will refer her to behavioral health for counseling and she is in agreement ?

## 2021-11-18 NOTE — Progress Notes (Signed)
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 very well At the time of dictation, results of her recent test reviewed improvement of CML control but not at major molecular response I have attempted to call the patient twice but was not able to get hold of her My recommendation would be to continue on current dose of treatment and we will continue close monitoring monthly until she has reached MMR  Monoallelic mutation of CHEK2 gene in female patient We had extensive discussions about the role of antiestrogen therapy as a form of preventative strategy She has appointment scheduled to see a breast oncologist for further review  Continuous dependence on cigarette smoking We discussed the importance of nicotine cessation She will try her best to quit smoking  Depression She is depressed but not suicidal She has difficulties dealing with her health issues and multiple different stress from family I will refer her to behavioral health for counseling and she is in agreement  Osteoporosis I have reviewed recent bone density results with the patient She has osteoporosis measured by bone density in her forearm and mild osteopenia elsewhere We discussed the role of calcium with vitamin D supplement We also discussed importance of weightbearing exercises as tolerated If she is in agreement, I think Evista would be a good choice to help with osteoporosis and reduce risk of breast cancer  Orders Placed This Encounter  Procedures   Ambulatory referral to Psychology    Referral Priority:   Routine    Referral Type:   Psychiatric    Referral Reason:   Specialty Services Required    Referred to Provider:   Hilbert Corrigan, PsyD    Requested Specialty:   Psychology    Number of Visits Requested:   1    All questions were answered.  The patient knows to call the clinic with any problems, questions or concerns. The total time spent in the appointment was 40 minutes encounter with patients including review of chart and various tests results, discussions about plan of care and coordination of care plan   Artis Delay, MD 11/18/2021 3:30 PM  INTERVAL HISTORY: Please see below for problem oriented charting. she returns for treatment follow-up for CML and high risk breast cancer situation Her recent infection has resolved She has no concerns of osteoporosis Unfortunately, she is undergoing a lot of stress at home and unable to quit smoking She is taking Sprycel on a regular basis without missing doses No recent signs or symptoms of fluid retention  REVIEW OF SYSTEMS:   Constitutional: Denies fevers, chills or abnormal weight loss Eyes: Denies blurriness of vision Ears, nose, mouth, throat, and face: Denies mucositis or sore throat Respiratory: Denies cough, dyspnea or wheezes Cardiovascular: Denies palpitation, chest discomfort or lower extremity swelling Gastrointestinal:  Denies nausea, heartburn or change in bowel habits Skin: Denies abnormal skin rashes Lymphatics: Denies new lymphadenopathy or easy bruising Neurological:Denies numbness, tingling or new weaknesses Behavioral/Psych: Mood is stable, no new changes  All other systems were reviewed with the patient and are negative.  I have reviewed the past medical history, past surgical history, social history and family history with the patient and they are unchanged from previous note.  ALLERGIES:  is allergic to other, sulfa antibiotics, wellbutrin [bupropion], and piperacillin sod-tazobactam so.  MEDICATIONS:  Current Outpatient Medications  Medication Sig Dispense Refill  calcium carbonate (TUMS - DOSED IN MG ELEMENTAL CALCIUM) 500 MG chewable tablet Chew 1 tablet by mouth 2 (two) times daily.     cholecalciferol (VITAMIN D3) 25 MCG (1000 UNIT) tablet Take  2,000 Units by mouth daily.     acetaminophen (TYLENOL) 325 MG tablet Take 325 mg by mouth every 6 (six) hours as needed (pain).      cholestyramine (QUESTRAN) 4 GM/DOSE powder Take 4 g by mouth daily.     clonazePAM (KLONOPIN) 0.5 MG tablet Take 0.5 mg by mouth 2 (two) times daily as needed for anxiety.     dasatinib (SPRYCEL) 70 MG tablet Take 1 tablet (70 mg total) by mouth daily. 30 tablet 11   escitalopram (LEXAPRO) 20 MG tablet Take 30 mg by mouth daily.     ibuprofen (ADVIL) 200 MG tablet Take 200 mg by mouth every 6 (six) hours as needed for moderate pain.      Lactobacillus (PROBIOTIC ACIDOPHILUS PO) Take 1 capsule by mouth daily.     loperamide (IMODIUM) 2 MG capsule Take 2 mg by mouth daily as needed for diarrhea or loose stools.     LORazepam (ATIVAN) 1 MG tablet Take 1 tablet (1 mg total) by mouth 2 (two) times daily as needed for anxiety. 10 tablet 0   metaxalone (SKELAXIN) 400 MG tablet Take 1 tablet (400 mg total) by mouth 3 (three) times daily as needed for muscle spasms. 15 tablet 0   Multiple Vitamin (MULTIVITAMIN WITH MINERALS) TABS tablet Take 1 tablet by mouth daily.     ondansetron (ZOFRAN) 4 MG tablet Take 1 tablet (4 mg total) by mouth every 8 (eight) hours as needed for nausea or vomiting. 30 tablet 0   No current facility-administered medications for this visit.    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HCC)  03/01/2021 Initial Diagnosis   CML (chronic myeloid leukemia) (HCC)   03/01/2021 Pathology Results   BCR/ABL by FISH: 91% positive BRC/ABL by PCR: e13a2 (b2a2) by IS is 196.685%   04/25/2021 -  Chemotherapy   She started taking imatinib   05/15/2021 Pathology Results   BRC/ABL is detected by PCR: e13a2 (b2a2) by IS is 70.731%   06/17/2021 Pathology Results   e13a2 (b2a2) by Quantidex (IS): 39.7401%   07/24/2021 Pathology Results   e13a2 (b2a2) by Quantidex (IS): 33.3%   08/05/2021 -  Chemotherapy   She started  taking Sprycel    08/30/2021 Pathology Results   e13a2 (b2a2) by Quantidex (IS): 9.8004%   11/18/2021 Pathology Results   e13a2 (b2a2) by Quantidex (IS): 0.8987%     PHYSICAL EXAMINATION: ECOG PERFORMANCE STATUS: 1 - Symptomatic but completely ambulatory  Vitals:   11/18/21 1157  BP: 106/65  Pulse: 80  Resp: 18  Temp: 97.7 F (36.5 C)  SpO2: 99%   Filed Weights   11/18/21 1157  Weight: 145 lb 9.6 oz (66 kg)    GENERAL:alert, no distress and comfortable NEURO: alert & oriented x 3 with fluent speech, no focal motor/sensory deficits  LABORATORY DATA:  I have reviewed the data as listed    Component Value Date/Time   NA 141 11/11/2021 1216   K 3.6 11/11/2021 1216   CL 108 11/11/2021 1216   CO2 27 11/11/2021 1216   GLUCOSE 117 (H) 11/11/2021 1216   BUN 16 11/11/2021 1216   CREATININE 0.93 11/11/2021 1216   CALCIUM 9.3 11/11/2021 1216   PROT 6.6 11/11/2021 1216   ALBUMIN 4.3 11/11/2021 1216  AST 23 11/11/2021 1216   ALT 19 11/11/2021 1216   ALKPHOS 98 11/11/2021 1216   BILITOT 0.4 11/11/2021 1216   GFRNONAA >60 11/11/2021 1216   GFRAA >60 10/21/2019 1020    No results found for: SPEP, UPEP  Lab Results  Component Value Date   WBC 6.6 11/11/2021   NEUTROABS 3.4 11/11/2021   HGB 13.4 11/11/2021   HCT 38.1 11/11/2021   MCV 94.8 11/11/2021   PLT 257 11/11/2021      Chemistry      Component Value Date/Time   NA 141 11/11/2021 1216   K 3.6 11/11/2021 1216   CL 108 11/11/2021 1216   CO2 27 11/11/2021 1216   BUN 16 11/11/2021 1216   CREATININE 0.93 11/11/2021 1216      Component Value Date/Time   CALCIUM 9.3 11/11/2021 1216   ALKPHOS 98 11/11/2021 1216   AST 23 11/11/2021 1216   ALT 19 11/11/2021 1216   BILITOT 0.4 11/11/2021 1216       RADIOGRAPHIC STUDIES: I have personally reviewed the radiological images as listed and agreed with the findings in the report. DG MOBILE BONE DENSITY  Result Date: 10/23/2021 CLINICAL DATA:  Postmenopausal. The  patient reports history of a broken bone from a minor injury. EXAM: DUAL X-RAY ABSORPTIOMETRY (DXA) FOR BONE MINERAL DENSITY TECHNIQUE: Bone mineral density measurements are performed of the spine, hip, and forearm, as appropriate, per International Society of Clinical Densitometry recommendations. The pertinent regions of interest are reported below. Non-contributory values are not reported. Images are obtained for bone mineral density measurement and are not obtained for diagnostic purposes. FINDINGS: AP LUMBAR SPINE L1-L4 Bone Mineral Density (BMD):  0.969 g/cm2 Young Adult T-Score:  -0.7 Z-Score:  0.3 LEFT FEMUR NECK Bone Mineral Density (BMD):  0.661 g/cm2 Young Adult T-Score: -1.7 Z-Score:  -0.7 LEFT FOREARM (1/3 RADIUS) Bone Mineral Density (BMD):  0.465 g/cm2 Young Adult T-Score:  -3.8 Z-Score:  -2.9 Unit: This study was performed at [facility] on the [employer] Ci (S/N [id number]), software version 13.4.2. Scan quality: The scan quality is good. Exclusions: None ASSESSMENT: Patient's diagnostic category is OSTEOPOROSIS by WHO Criteria. FRACTURE RISK: INCREASED FRAX: World Health Organization FRAX assessment of absolute fracture risk is not calculated for this patient because the patient has osteoporosis. COMPARISON: None. RECOMMENDATIONS 1. All patients should optimize calcium and vitamin D intake. 2. Consider FDA-approved medical therapies in postmenopausal women and men aged 41 years and older, based on the following: - A hip or vertebral (clinical or morphometric) fracture - T-score less than or equal to -2.5 and secondary causes have been excluded. - Low bone mass (T-score between -1.0 and -2.5) and a 10-year probability of a hip fracture greater than or equal to 3% or a 10-year probability of a major osteoporosis-related fracture greater than or equal to 20% based on the [place]-adapted WHO algorithm. - Clinician judgment and/or patient preferences may indicate treatment for people with 10-year  fracture probabilities above or below these levels 3. Patients with diagnosis of osteoporosis or at high risk for fracture should have regular bone mineral density tests. For patients eligible for Medicare, routine testing is allowed once every 2 years. The testing frequency can be increased to one year for patients who have rapidly progressing disease, those who are receiving or discontinuing medical therapy to restore bone mass, or have additional risk factors. Electronically Signed   By: Romona Curls M.D.   On: 10/23/2021 10:07

## 2021-11-18 NOTE — Assessment & Plan Note (Signed)
We discussed the importance of nicotine cessation She will try her best to quit smoking 

## 2021-11-18 NOTE — Assessment & Plan Note (Signed)
We had extensive discussions about the role of antiestrogen therapy as a form of preventative strategy ?She has appointment scheduled to see a breast oncologist for further review ?

## 2021-11-19 ENCOUNTER — Telehealth: Payer: Self-pay

## 2021-11-19 NOTE — Telephone Encounter (Signed)
Called per Dr. Alvy Bimler, BCR/ABL is less than 1%, good news. Continue same dose of medication. Dr. Alvy Bimler will send a scheduling message for lab and then MD appt 10 days later in 1 month. She verbalized understanding and appreciated the call. ?

## 2021-11-21 LAB — BCR/ABL

## 2021-11-22 ENCOUNTER — Ambulatory Visit (INDEPENDENT_AMBULATORY_CARE_PROVIDER_SITE_OTHER): Payer: BC Managed Care – PPO | Admitting: Psychologist

## 2021-11-22 DIAGNOSIS — F330 Major depressive disorder, recurrent, mild: Secondary | ICD-10-CM | POA: Diagnosis not present

## 2021-11-22 DIAGNOSIS — F411 Generalized anxiety disorder: Secondary | ICD-10-CM

## 2021-11-22 NOTE — Progress Notes (Signed)
                Kiandre Spagnolo, PsyD 

## 2021-11-22 NOTE — Plan of Care (Signed)
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
## 2021-11-25 ENCOUNTER — Other Ambulatory Visit (HOSPITAL_COMMUNITY): Payer: Self-pay

## 2021-12-05 ENCOUNTER — Other Ambulatory Visit (HOSPITAL_COMMUNITY): Payer: Self-pay

## 2021-12-09 ENCOUNTER — Other Ambulatory Visit (HOSPITAL_COMMUNITY): Payer: Self-pay

## 2021-12-10 ENCOUNTER — Other Ambulatory Visit (HOSPITAL_COMMUNITY): Payer: Self-pay

## 2021-12-11 ENCOUNTER — Other Ambulatory Visit (HOSPITAL_COMMUNITY): Payer: Self-pay

## 2021-12-11 ENCOUNTER — Ambulatory Visit: Payer: BC Managed Care – PPO | Admitting: Orthopaedic Surgery

## 2021-12-13 ENCOUNTER — Other Ambulatory Visit (HOSPITAL_COMMUNITY): Payer: Self-pay

## 2021-12-17 ENCOUNTER — Ambulatory Visit (INDEPENDENT_AMBULATORY_CARE_PROVIDER_SITE_OTHER): Payer: BC Managed Care – PPO

## 2021-12-17 ENCOUNTER — Inpatient Hospital Stay: Payer: BC Managed Care – PPO

## 2021-12-17 ENCOUNTER — Other Ambulatory Visit (HOSPITAL_COMMUNITY): Payer: Self-pay

## 2021-12-17 ENCOUNTER — Encounter: Payer: Self-pay | Admitting: Orthopaedic Surgery

## 2021-12-17 ENCOUNTER — Other Ambulatory Visit: Payer: Self-pay

## 2021-12-17 ENCOUNTER — Ambulatory Visit (INDEPENDENT_AMBULATORY_CARE_PROVIDER_SITE_OTHER): Payer: BC Managed Care – PPO | Admitting: Orthopaedic Surgery

## 2021-12-17 DIAGNOSIS — M5442 Lumbago with sciatica, left side: Secondary | ICD-10-CM

## 2021-12-17 DIAGNOSIS — C9210 Chronic myeloid leukemia, BCR/ABL-positive, not having achieved remission: Secondary | ICD-10-CM

## 2021-12-17 DIAGNOSIS — M25552 Pain in left hip: Secondary | ICD-10-CM

## 2021-12-17 LAB — COMPREHENSIVE METABOLIC PANEL
ALT: 14 U/L (ref 0–44)
AST: 20 U/L (ref 15–41)
Albumin: 4 g/dL (ref 3.5–5.0)
Alkaline Phosphatase: 76 U/L (ref 38–126)
Anion gap: 4 — ABNORMAL LOW (ref 5–15)
BUN: 15 mg/dL (ref 6–20)
CO2: 27 mmol/L (ref 22–32)
Calcium: 9.1 mg/dL (ref 8.9–10.3)
Chloride: 110 mmol/L (ref 98–111)
Creatinine, Ser: 0.93 mg/dL (ref 0.44–1.00)
GFR, Estimated: >60 mL/min (ref >60)
Glucose, Bld: 122 mg/dL — ABNORMAL HIGH (ref 70–99)
Potassium: 3.8 mmol/L (ref 3.5–5.1)
Sodium: 141 mmol/L (ref 135–145)
Total Bilirubin: 0.4 mg/dL (ref 0.3–1.2)
Total Protein: 6.1 g/dL — ABNORMAL LOW (ref 6.5–8.1)

## 2021-12-17 LAB — CBC WITH DIFFERENTIAL/PLATELET
Abs Immature Granulocytes: 0.01 10*3/uL (ref 0.00–0.07)
Basophils Absolute: 0.1 10*3/uL (ref 0.0–0.1)
Basophils Relative: 1 %
Eosinophils Absolute: 1.2 10*3/uL — ABNORMAL HIGH (ref 0.0–0.5)
Eosinophils Relative: 15 %
HCT: 36.5 % (ref 36.0–46.0)
Hemoglobin: 13 g/dL (ref 12.0–15.0)
Immature Granulocytes: 0 %
Lymphocytes Relative: 29 %
Lymphs Abs: 2.4 10*3/uL (ref 0.7–4.0)
MCH: 33.7 pg (ref 26.0–34.0)
MCHC: 35.6 g/dL (ref 30.0–36.0)
MCV: 94.6 fL (ref 80.0–100.0)
Monocytes Absolute: 0.4 10*3/uL (ref 0.1–1.0)
Monocytes Relative: 5 %
Neutro Abs: 4.2 10*3/uL (ref 1.7–7.7)
Neutrophils Relative %: 50 %
Platelets: 242 10*3/uL (ref 150–400)
RBC: 3.86 MIL/uL — ABNORMAL LOW (ref 3.87–5.11)
RDW: 12.2 % (ref 11.5–15.5)
WBC: 8.3 10*3/uL (ref 4.0–10.5)
nRBC: 0 % (ref 0.0–0.2)

## 2021-12-17 MED ORDER — METHYLPREDNISOLONE 4 MG PO TBPK
ORAL_TABLET | ORAL | 0 refills | Status: DC
  Filled 2021-12-17: qty 21, 6d supply, fill #0

## 2021-12-17 NOTE — Progress Notes (Signed)
Office Visit Note   Patient: [name]           Date of Birth: [birth year]           MRN: [id number] Visit Date: 12/17/2021              Requested by: Merrilee Seashore, [address] [place],  [place] [address] PCP: Merrilee Seashore, MD   Assessment & Plan: Visit Diagnoses:  1. Acute left-sided low back pain with left-sided sciatica   2. 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  We will place her on a Medrol Dosepak hopefully she does not have the same side effects as she does to prednisone.  We will place a referral for outpatient PT.  She will back off on activity for now.  Follow-up as needed.  Follow-Up Instructions: No follow-ups on file.   Orders:  Orders Placed This Encounter  Procedures   XR Lumbar Spine 2-3 Views   XR HIP UNILAT W OR W/O PELVIS 2-3 VIEWS LEFT   Ambulatory referral to Physical Therapy   Meds ordered this encounter  Medications   methylPREDNISolone (MEDROL DOSEPAK) 4 MG TBPK tablet    Sig: Take as directed per package    Dispense:  21 tablet    Refill:  0      Procedures: No procedures performed   Clinical Data: No additional findings.   Subjective: Chief Complaint  Patient presents with   Lower Back - Pain   Left Hip - Pain    HPI [name] is a 56 year old female here for left leg pain for 3 weeks.  Describes a dull pain in the thigh and into the shin.  Denies any red flag symptoms.  Has had bouts of sciatica in the past and sees a chiropractor regularly.  She is very active in the garden and rides horses and works at [employer].  Denies any numbness and tingling. Review of Systems  Constitutional: Negative.   HENT: Negative.    Eyes: Negative.   Respiratory: Negative.    Cardiovascular: Negative.   Endocrine: Negative.   Musculoskeletal: Negative.   Neurological: Negative.   Hematological: Negative.   Psychiatric/Behavioral:  Negative.    All other systems reviewed and are negative.   Objective: Vital Signs: LMP 11/18/2012   Physical Exam Vitals and nursing note reviewed.  Constitutional:      Appearance: She is well-developed.  HENT:     Head: Normocephalic and atraumatic.  Pulmonary:     Effort: Pulmonary effort is normal.  Abdominal:     Palpations: Abdomen is soft.  Musculoskeletal:     Cervical back: Neck supple.  Skin:    General: Skin is warm.     Capillary Refill: Capillary refill takes less than 2 seconds.  Neurological:     Mental Status: She is alert and oriented to person, place, and time.  Psychiatric:        Behavior: Behavior normal.        Thought Content: Thought content normal.        Judgment: Judgment normal.    Ortho Exam Examination of the left lower extremity shows no focal motor or sensory deficits.  Normal reflexes.  Positive sciatic tension signs. Specialty Comments:  No specialty comments available.  Imaging: XR HIP UNILAT W OR W/O PELVIS 2-3 VIEWS LEFT  Result Date: 12/17/2021 No acute or structure abnormalities.  XR Lumbar Spine 2-3   Office Visit Note   Patient: [name]           Date of Birth: 07/26/1965           MRN: [id number] Visit Date: 12/17/2021              Requested by: Ramachandran, Ajith, MD [address] ,  [place] [address] PCP: Ramachandran, Ajith, MD   Assessment & Plan: Visit Diagnoses:  1. Acute left-sided low back pain with left-sided sciatica   2. 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  We will place her on a Medrol Dosepak hopefully she does not have the same side effects as she does to prednisone.  We will place a referral for outpatient PT.  She will back off on activity for now.  Follow-up as needed.  Follow-Up Instructions: No follow-ups on file.   Orders:  Orders Placed This Encounter  Procedures   XR Lumbar Spine 2-3 Views   XR HIP UNILAT W OR W/O PELVIS 2-3 VIEWS LEFT   Ambulatory referral to Physical Therapy   Meds ordered this encounter  Medications   methylPREDNISolone (MEDROL DOSEPAK) 4 MG TBPK tablet    Sig: Take as directed per package    Dispense:  21 tablet    Refill:  0      Procedures: No procedures performed   Clinical Data: No additional findings.   Subjective: Chief Complaint  Patient presents with   Lower Back - Pain   Left Hip - Pain    HPI [name] is a 55-year-old female here for left leg pain for 3 weeks.  Describes a dull pain in the thigh and into the shin.  Denies any red flag symptoms.  Has had bouts of sciatica in the past and sees a chiropractor regularly.  She is very active in the garden and rides horses and works at [employer].  Denies any numbness and tingling. Review of Systems  Constitutional: Negative.   HENT: Negative.    Eyes: Negative.   Respiratory: Negative.    Cardiovascular: Negative.   Endocrine: Negative.   Musculoskeletal: Negative.   Neurological: Negative.   Hematological: Negative.   Psychiatric/Behavioral:  Negative.    All other systems reviewed and are negative.   Objective: Vital Signs: LMP 11/18/2012   Physical Exam Vitals and nursing note reviewed.  Constitutional:      Appearance: She is well-developed.  HENT:     Head: Normocephalic and atraumatic.  Pulmonary:     Effort: Pulmonary effort is normal.  Abdominal:     Palpations: Abdomen is soft.  Musculoskeletal:     Cervical back: Neck supple.  Skin:    General: Skin is warm.     Capillary Refill: Capillary refill takes less than 2 seconds.  Neurological:     Mental Status: She is alert and oriented to person, place, and time.  Psychiatric:        Behavior: Behavior normal.        Thought Content: Thought content normal.        Judgment: Judgment normal.    Ortho Exam Examination of the left lower extremity shows no focal motor or sensory deficits.  Normal reflexes.  Positive sciatic tension signs. Specialty Comments:  No specialty comments available.  Imaging: XR HIP UNILAT W OR W/O PELVIS 2-3 VIEWS LEFT  Result Date: 12/17/2021 No acute or structure abnormalities.  XR Lumbar Spine 2-3

## 2021-12-18 ENCOUNTER — Telehealth: Payer: Self-pay | Admitting: Orthopaedic Surgery

## 2021-12-18 ENCOUNTER — Other Ambulatory Visit (HOSPITAL_COMMUNITY): Payer: Self-pay

## 2021-12-18 ENCOUNTER — Encounter: Payer: Self-pay | Admitting: Orthopaedic Surgery

## 2021-12-18 NOTE — Telephone Encounter (Signed)
Patient called. She would like a  refill on her muscle relaxer. She can not sleep with the pain. Her call back number is [phone]

## 2021-12-19 ENCOUNTER — Other Ambulatory Visit (HOSPITAL_COMMUNITY): Payer: Self-pay

## 2021-12-19 ENCOUNTER — Other Ambulatory Visit: Payer: Self-pay | Admitting: Physician Assistant

## 2021-12-19 MED ORDER — METHOCARBAMOL 500 MG PO TABS
500.0000 mg | ORAL_TABLET | Freq: Two times a day (BID) | ORAL | 0 refills | Status: DC | PRN
  Filled 2021-12-19: qty 20, 10d supply, fill #0

## 2021-12-19 NOTE — Telephone Encounter (Signed)
Called  patient no answer. Just need to advise on message below.

## 2021-12-19 NOTE — Telephone Encounter (Signed)
Sent in Orestes.  Make sure she is no longer taking skelaxin

## 2021-12-19 NOTE — Telephone Encounter (Signed)
Robaxin has been sent in

## 2021-12-20 ENCOUNTER — Ambulatory Visit (INDEPENDENT_AMBULATORY_CARE_PROVIDER_SITE_OTHER): Payer: BC Managed Care – PPO | Admitting: Psychologist

## 2021-12-20 ENCOUNTER — Ambulatory Visit: Payer: BC Managed Care – PPO | Admitting: Psychologist

## 2021-12-20 ENCOUNTER — Other Ambulatory Visit (HOSPITAL_COMMUNITY): Payer: Self-pay

## 2021-12-20 DIAGNOSIS — F330 Major depressive disorder, recurrent, mild: Secondary | ICD-10-CM

## 2021-12-20 DIAGNOSIS — F411 Generalized anxiety disorder: Secondary | ICD-10-CM | POA: Diagnosis not present

## 2021-12-20 NOTE — Progress Notes (Signed)
[facility] Counselor/Therapist Progress Note  Patient ID: [name], MRN: [id number],    Date: 12/20/2021  Time Spent: 11:10 am to 11:26 am; total time: 16 minutes   This session was held via in person. The patient consented to in-person therapy and was in the clinician's office. Limits of confidentiality were discussed with the patient.   Treatment Type: Individual Therapy  Reported Symptoms: Pain due to a back health condition. Some anxiety  Mental Status Exam: Appearance:  Well Groomed     Behavior: Appropriate  Motor: Normal  Speech/Language:  Clear and Coherent  Affect: Appropriate  Mood: normal  Thought process: normal  Thought content:   WNL  Sensory/Perceptual disturbances:   WNL  Orientation: oriented to person, place, and time/date  Attention: Good  Concentration: Good  Memory: WNL  Fund of knowledge:  Good  Insight:   Good  Judgment:  Good  Impulse Control: Good   Risk Assessment: Danger to Self:  No Self-injurious Behavior: No Danger to Others: No Duty to Warn:no Physical Aggression / Violence:No  Access to Firearms a concern: No  Gang Involvement:No   Subjective: Beginning the session, patient described herself as experiencing pain due to a back health condition. She voiced wanting a shorter session. After reviewing the treatment plan, patient participated in guided imagery and described it as helpful. She was agreeable to homework and following up. She denied suicidal and homicidal ideation.    Interventions:  Worked on developing a therapeutic relationship with the patient using active listening and reflective statements. Provided emotional support using empathy and validation. Reviewed the treatment plan with the patient. Briefly reviewed events since the intake. Validated expressed thoughts. Identified goals for the session. Provided psychoeducation about guided imagery. Practiced and processed guided imagery. Praised patient for  experiencing less distress and less pain. Assisted in problem solving. Assigned homework. Assessed for suicidal and homicidal ideation.   Homework: Implement guided imagery  Next Session: Review homework, additional coping skills, review appointment with oncology, and emotional support.   Diagnosis: F41.1 generalized anxiety disorder and F33.0 major depressive affective disorder, recurrent, mild   Plan:   Goals Work through the grieving process and face reality of own death Accept emotional support from others around them Live life to the fullest, event though time may be limited Become as knowledgeable about the medical condition  Reduce fear, anxiety about the health condition  Accept the illness Accept the role of psychological and behavioral factors  Stabilize anxiety level wile increasing ability to function Learn and implement coping skills that result in a reduction of anxiety  Alleviate depressive symptoms Recognize, accept, and cope with depressive feelings Develop healthy thinking patterns Develop healthy interpersonal relationships  Objectives target date for all objectives is 11/23/2022 Identify feelings associated with the illness Family members share with each other feelings Identify the losses or limitations that have been experienced Verbalize acceptance of the reality of the medical condition Commit to learning and implement a proactive approach to managing personal stresses Verbalize an understanding of the medical condition Work with therapist to develop a plan for coping with stress Learn and implement skills for managing stress Engage in social, productive activities that are possible Engage in faith based activities implement positive imagery Identify coping skills and sources of emotional support Patient's partner and family members verbalize their fears regarding severity of health condition Identify sources of emotional distress  Learning and implement  calming skills to reduce overall anxiety Learn and implement problem solving strategies Identify  and engage in pleasant activities Learning and implement personal and interpersonal skills to reduce anxiety and improve interpersonal relationships Learn to accept limitations in life and commit to tolerating, rather than avoiding, unpleasant emotions while accomplishing meaningful goals Identify major life conflicts from the past and present that form the basis for present anxiety Learn and implement behavioral strategies Verbalize an understanding and resolution of current interpersonal problems Learn and implement problem solving and decision making skills Learn and implement conflict resolution skills to resolve interpersonal problems Verbalize an understanding of healthy and unhealthy emotions verbalize insight into how past relationships may be influence current experiences with depression Use mindfulness and acceptance strategies and increase value based behavior  Increase hopeful statements about the future.   Interventions Teach about stress and ways to handle stress Assist the patient in developing a coping action plan for stressors Conduct skills based training for coping strategies Train problem focused skills Sort out what activities the individual can do Encourage patient to rely upon his/her spiritual faith Teach the patient to use guided imagery Probe and evaluate family's ability to provide emotional support Allow family to share their fears Assist the patient in identifying, sorting through, and verbalizing the various feelings generated by his/her medical condition Meet with family members  Ask patient list out limitations  Use stress inoculation training  Use Acceptance and Commitment Therapy to help client accept uncomfortable realities in order to accomplish value-consistent goals Reinforce the client's insight into the role of his/her past emotional pain and present  anxiety  Discuss examples demonstrating that unrealistic worry overestimates the probability of threats and underestimate patient's ability  Assist the patient in analyzing his or her worries Help patient understand that avoidance is reinforcing  Behavioral activation help the client explore the relationship, nature of the dispute,  Help the client develop new interpersonal skills and relationships Conduct Problem so living therapy Teach conflict resolution skills Use a process-experiential approach Conduct TLDP Conduct ACT  The patient and clinician reviewed the treatment plan on 12/20/2021. The patient approved of the treatment plan.   Conception Chancy, PsyD

## 2021-12-20 NOTE — Progress Notes (Signed)
                Desman Polak, PsyD 

## 2021-12-23 LAB — BCR/ABL

## 2021-12-23 NOTE — Therapy (Incomplete)
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   Past Surgical History:  Procedure Laterality Date   ABDOMINAL HYSTERECTOMY     APPENDECTOMY     bowel removal     5 inches   BOWEL RESECTION     2 feet   BREAST BIOPSY Right 04/29/2019   APOCRINE METAPLASIA    BREAST LUMPECTOMY WITH RADIOACTIVE SEED LOCALIZATION Left 10/03/2021   Procedure: LEFT BREAST LUMPECTOMY WITH RADIOACTIVE SEED LOCALIZATION;  Surgeon: Jovita Kussmaul, MD;  Location: [facility];  Service: General;  Laterality: Left;   Patient Active Problem List   Diagnosis Date Noted   Depression 11/18/2021   Osteoporosis 11/18/2021   Abnormal magnetic resonance imaging of left breast 08/06/2021   Diarrhea 05/24/2021   Genetic testing 42/68/3419   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  ONSET DATE: ***  SUBJECTIVE:                                                                                                                                                                                           SUBJECTIVE STATEMENT: *** PERTINENT HISTORY:  Anxiety, several abdominal  surgeries including bowel resection, osteoporosis, smoker  PAIN:  Are you having pain? Yes: {yespain:27235::"NPRS scale: ***/10","Pain location: ***","Pain description: ***","Aggravating factors: ***","Relieving factors: ***"}   PRECAUTIONS: {Therapy precautions:24002}  WEIGHT BEARING RESTRICTIONS {Yes ***/No:24003}  FALLS:  Has patient fallen in last 6 months? {fallsyesno:27318}  LIVING ENVIRONMENT: Lives with: {[facility] lives with:[address]69::"lives with their family"} Lives in: {Lives in:[address]} Stairs: {opstairs:27293} Has following equipment at home: {Assistive devices:23999}  OCCUPATION: ***  PLOF: {PLOF:24004}  PATIENT GOALS ***   OBJECTIVE:   DIAGNOSTIC FINDINGS:  Mild degenerative scoliosis of the lumbar spine.  Hip imaging is unremarkable.  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   Past Surgical History:  Procedure Laterality Date   ABDOMINAL HYSTERECTOMY     APPENDECTOMY     bowel removal     5 inches   BOWEL RESECTION     2 feet   BREAST BIOPSY Right 04/29/2019   APOCRINE METAPLASIA    BREAST LUMPECTOMY WITH RADIOACTIVE SEED LOCALIZATION Left 10/03/2021   Procedure: LEFT BREAST LUMPECTOMY WITH RADIOACTIVE SEED LOCALIZATION;  Surgeon: Jovita Kussmaul, MD;  Location: [facility];  Service: General;  Laterality: Left;   Patient Active Problem List   Diagnosis Date Noted   Depression 11/18/2021   Osteoporosis 11/18/2021   Abnormal magnetic resonance imaging of left breast 08/06/2021   Diarrhea 05/24/2021   Genetic testing 42/68/3419   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  ONSET DATE: ***  SUBJECTIVE:                                                                                                                                                                                           SUBJECTIVE STATEMENT: *** PERTINENT HISTORY:  Anxiety, several abdominal  surgeries including bowel resection, osteoporosis, smoker  PAIN:  Are you having pain? Yes: {yespain:27235::"NPRS scale: ***/10","Pain location: ***","Pain description: ***","Aggravating factors: ***","Relieving factors: ***"}   PRECAUTIONS: {Therapy precautions:24002}  WEIGHT BEARING RESTRICTIONS {Yes ***/No:24003}  FALLS:  Has patient fallen in last 6 months? {fallsyesno:27318}  LIVING ENVIRONMENT: Lives with: {[facility] lives with:[address]69::"lives with their family"} Lives in: {Lives in:[address]} Stairs: {opstairs:27293} Has following equipment at home: {Assistive devices:23999}  OCCUPATION: ***  PLOF: {PLOF:24004}  PATIENT GOALS ***   OBJECTIVE:   DIAGNOSTIC FINDINGS:  Mild degenerative scoliosis of the lumbar spine.  Hip imaging is unremarkable.  PATIENT SURVEYS:  FOTO ***  SCREENING FOR RED FLAGS: Bowel or bladder incontinence: {NLG/XQ:119417408} Spinal tumors: {Yes/No:[id number]}

## 2021-12-24 ENCOUNTER — Telehealth: Payer: Self-pay | Admitting: Hematology and Oncology

## 2021-12-24 ENCOUNTER — Other Ambulatory Visit (HOSPITAL_COMMUNITY): Payer: Self-pay

## 2021-12-24 NOTE — Telephone Encounter (Signed)
Rescheduled appointment per email sent from [facility]. Left message with new appointment time.

## 2021-12-27 ENCOUNTER — Ambulatory Visit: Payer: BC Managed Care – PPO | Admitting: Rehabilitative and Restorative Service Providers"

## 2021-12-27 ENCOUNTER — Inpatient Hospital Stay: Payer: BC Managed Care – PPO | Admitting: Hematology and Oncology

## 2021-12-27 ENCOUNTER — Telehealth: Payer: Self-pay | Admitting: Hematology and Oncology

## 2021-12-27 NOTE — Telephone Encounter (Signed)
.  Called patient to schedule appointment per 6/9 inbasket, patient is aware of date and time.   

## 2021-12-29 ENCOUNTER — Encounter: Payer: Self-pay | Admitting: Orthopaedic Surgery

## 2021-12-30 ENCOUNTER — Inpatient Hospital Stay: Payer: BC Managed Care – PPO | Admitting: Hematology and Oncology

## 2021-12-30 NOTE — Telephone Encounter (Signed)
I would still suggest trying the PT first at least for a few weeks to see if she sees any improvement

## 2022-01-01 ENCOUNTER — Other Ambulatory Visit (HOSPITAL_COMMUNITY): Payer: Self-pay

## 2022-01-06 ENCOUNTER — Inpatient Hospital Stay: Payer: BC Managed Care – PPO | Attending: Hematology and Oncology | Admitting: Hematology and Oncology

## 2022-01-06 ENCOUNTER — Encounter: Payer: Self-pay | Admitting: Hematology and Oncology

## 2022-01-06 ENCOUNTER — Other Ambulatory Visit: Payer: Self-pay

## 2022-01-06 DIAGNOSIS — Z7952 Long term (current) use of systemic steroids: Secondary | ICD-10-CM | POA: Diagnosis not present

## 2022-01-06 DIAGNOSIS — F17210 Nicotine dependence, cigarettes, uncomplicated: Secondary | ICD-10-CM

## 2022-01-06 DIAGNOSIS — Z1589 Genetic susceptibility to other disease: Secondary | ICD-10-CM | POA: Diagnosis not present

## 2022-01-06 DIAGNOSIS — C9210 Chronic myeloid leukemia, BCR/ABL-positive, not having achieved remission: Secondary | ICD-10-CM | POA: Diagnosis present

## 2022-01-06 DIAGNOSIS — Z79899 Other long term (current) drug therapy: Secondary | ICD-10-CM | POA: Insufficient documentation

## 2022-01-06 DIAGNOSIS — Z1509 Genetic susceptibility to other malignant neoplasm: Secondary | ICD-10-CM | POA: Diagnosis not present

## 2022-01-06 DIAGNOSIS — Z1501 Genetic susceptibility to malignant neoplasm of breast: Secondary | ICD-10-CM | POA: Diagnosis not present

## 2022-01-06 DIAGNOSIS — Z1502 Genetic susceptibility to malignant neoplasm of ovary: Secondary | ICD-10-CM

## 2022-01-06 NOTE — Progress Notes (Signed)
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[place]) She tolerated Sprycel very well At the time of dictation, results of her recent test was reviewed which showed she is heading towards major molecular response My recommendation would be to continue on current dose of treatment and we will continue close monitoring monthly until she has reached MMR  Continuous dependence on cigarette smoking We discussed the importance of nicotine cessation She will try her best to quit smoking  Monoallelic mutation of CHEK2 gene in female patient We had extensive discussions about the role of antiestrogen therapy as a form of preventative strategy She has appointment scheduled to see a breast oncologist for further review  No orders of the defined types were placed in this encounter.   All questions were answered. The patient knows to call the clinic with any problems, questions or concerns. The total time spent in the appointment was 20 minutes encounter with patients including review of chart and various tests results, discussions about plan of care and coordination of care plan   [name], MD 01/06/2022 12:07 PM  INTERVAL HISTORY: Please see below for problem oriented charting. she returns for treatment follow-up on Dasatinib for CML Since last time I saw her, she is doing well No new side effects from Dasatinib She had recent exacerbation of back pain She has appointment pending to return to discuss with her breast oncologist related to preventative strategies for her genetic disorder Unfortunately, she is still smoking but she is attempting to quit  REVIEW OF SYSTEMS:   Constitutional: Denies fevers, chills or abnormal weight loss Eyes: Denies blurriness of vision Ears, nose, mouth, throat, and face: Denies mucositis or sore throat Respiratory: Denies  cough, dyspnea or wheezes Cardiovascular: Denies palpitation, chest discomfort or lower extremity swelling Gastrointestinal:  Denies nausea, heartburn or change in bowel habits Skin: Denies abnormal skin rashes Lymphatics: Denies new lymphadenopathy or easy bruising Neurological:Denies numbness, tingling or new weaknesses Behavioral/Psych: Mood is stable, no new changes  All other systems were reviewed with the patient and are negative.  I have reviewed the past medical history, past surgical history, social history and family history with the patient and they are unchanged from previous note.  ALLERGIES:  is allergic to other, sulfa antibiotics, wellbutrin [bupropion], and piperacillin sod-tazobactam so.  MEDICATIONS:  Current Outpatient Medications  Medication Sig Dispense Refill   acetaminophen (TYLENOL) 325 MG tablet Take 325 mg by mouth every 6 (six) hours as needed (pain).      calcium carbonate (TUMS - DOSED IN MG ELEMENTAL CALCIUM) 500 MG chewable tablet Chew 1 tablet by mouth 2 (two) times daily.     cholecalciferol (VITAMIN D3) 25 MCG (1000 UNIT) tablet Take 2,000 Units by mouth daily.     cholestyramine (QUESTRAN) 4 GM/DOSE powder Take 4 g by mouth daily.     clonazePAM (KLONOPIN) 0.5 MG tablet Take 0.5 mg by mouth 2 (two) times daily as needed for anxiety.     dasatinib (SPRYCEL) 70 MG tablet Take 1 tablet (70 mg total) by mouth daily. 30 tablet 11   escitalopram (LEXAPRO) 20 MG tablet Take 30 mg by mouth daily.     ibuprofen (ADVIL) 200 MG tablet Take 200 mg by mouth every 6 (six) hours as needed for moderate pain.      Lactobacillus (PROBIOTIC ACIDOPHILUS PO) Take 1 capsule by mouth daily.  loperamide (IMODIUM) 2 MG capsule Take 2 mg by mouth daily as needed for diarrhea or loose stools.     LORazepam (ATIVAN) 1 MG tablet Take 1 tablet (1 mg total) by mouth 2 (two) times daily as needed for anxiety. 10 tablet 0   metaxalone (SKELAXIN) 400 MG tablet Take 1 tablet (400 mg  total) by mouth 3 (three) times daily as needed for muscle spasms. 15 tablet 0   methocarbamol (ROBAXIN) 500 MG tablet Take 1 tablet  by mouth 2  times daily as needed for muscle spasms. 20 tablet 0   methylPREDNISolone (MEDROL DOSEPAK) 4 MG TBPK tablet Take as directed per package 21 tablet 0   Multiple Vitamin (MULTIVITAMIN WITH MINERALS) TABS tablet Take 1 tablet by mouth daily.     ondansetron (ZOFRAN) 4 MG tablet Take 1 tablet (4 mg total) by mouth every 8 (eight) hours as needed for nausea or vomiting. 30 tablet 0   No current facility-administered medications for this visit.    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[place])   03/01/2021 Pathology Results   BCR/ABL by FISH: 91% positive BRC/ABL by PCR: e13a2 (b2a2) by IS is 196.685%   04/25/2021 -  Chemotherapy   She started taking imatinib   05/15/2021 Pathology Results   BRC/ABL is detected by PCR: e13a2 (b2a2) by IS is 70.731%   06/17/2021 Pathology Results   e13a2 (b2a2) by Quantidex (IS): 39.7401%   07/24/2021 Pathology Results   e13a2 (b2a2) by Quantidex (IS): 33.3%   08/05/2021 -  Chemotherapy   She started taking Sprycel    08/30/2021 Pathology Results   e13a2 (b2a2) by Quantidex (IS): 9.8004%   11/18/2021 Pathology Results   e13a2 (b2a2) by Quantidex (IS): 0.8987%   12/17/2021 Pathology Results   e13a2 (b2a2) by Quantidex (IS): 0.4764%     PHYSICAL EXAMINATION: ECOG PERFORMANCE STATUS: 1 - Symptomatic but completely ambulatory  Vitals:   01/06/22 1150  BP: 120/60  Pulse: 72  Resp: 16  Temp: 98.5 F (36.9 C)  SpO2: 96%   Filed Weights   01/06/22 1150  Weight: 144 lb 6.4 oz (65.5 kg)    GENERAL:alert, no distress and comfortable NEURO: alert & oriented x 3 with fluent speech, no focal motor/sensory deficits  LABORATORY DATA:  I have reviewed the data as listed    Component Value Date/Time    NA 141 12/17/2021 1202   K 3.8 12/17/2021 1202   CL 110 12/17/2021 1202   CO2 27 12/17/2021 1202   GLUCOSE 122 (H) 12/17/2021 1202   BUN 15 12/17/2021 1202   CREATININE 0.93 12/17/2021 1202   CALCIUM 9.1 12/17/2021 1202   PROT 6.1 (L) 12/17/2021 1202   ALBUMIN 4.0 12/17/2021 1202   AST 20 12/17/2021 1202   ALT 14 12/17/2021 1202   ALKPHOS 76 12/17/2021 1202   BILITOT 0.4 12/17/2021 1202   GFRNONAA >60 12/17/2021 1202   GFRAA >60 10/21/2019 1020    No results found for: "SPEP", "UPEP"  Lab Results  Component Value Date   WBC 8.3 12/17/2021   NEUTROABS 4.2 12/17/2021   HGB 13.0 12/17/2021   HCT 36.5 12/17/2021   MCV 94.6 12/17/2021   PLT 242 12/17/2021      Chemistry      Component Value Date/Time   NA 141 12/17/2021 1202   K 3.8 12/17/2021 1202   CL 110 12/17/2021 1202   CO2 27 12/17/2021  1202   BUN 15 12/17/2021 1202   CREATININE 0.93 12/17/2021 1202      Component Value Date/Time   CALCIUM 9.1 12/17/2021 1202   ALKPHOS 76 12/17/2021 1202   AST 20 12/17/2021 1202   ALT 14 12/17/2021 1202   BILITOT 0.4 12/17/2021 1202

## 2022-01-06 NOTE — Assessment & Plan Note (Signed)
We had extensive discussions about the role of antiestrogen therapy as a form of preventative strategy She has appointment scheduled to see a breast oncologist for further review

## 2022-01-06 NOTE — Assessment & Plan Note (Signed)
She tolerated Sprycel very well At the time of dictation, results of her recent test was reviewed which showed she is heading towards major molecular response My recommendation would be to continue on current dose of treatment and we will continue close monitoring monthly until she has reached MMR

## 2022-01-06 NOTE — Assessment & Plan Note (Signed)
We discussed the importance of nicotine cessation She will try her best to quit smoking 

## 2022-01-07 ENCOUNTER — Ambulatory Visit: Payer: BC Managed Care – PPO | Admitting: Physical Therapy

## 2022-01-07 ENCOUNTER — Other Ambulatory Visit: Payer: Self-pay | Admitting: Physician Assistant

## 2022-01-07 ENCOUNTER — Telehealth: Payer: Self-pay | Admitting: Orthopaedic Surgery

## 2022-01-07 ENCOUNTER — Encounter: Payer: Self-pay | Admitting: Physical Therapy

## 2022-01-07 ENCOUNTER — Other Ambulatory Visit: Payer: Self-pay

## 2022-01-07 DIAGNOSIS — R293 Abnormal posture: Secondary | ICD-10-CM | POA: Diagnosis not present

## 2022-01-07 DIAGNOSIS — M5459 Other low back pain: Secondary | ICD-10-CM | POA: Diagnosis not present

## 2022-01-07 DIAGNOSIS — M6281 Muscle weakness (generalized): Secondary | ICD-10-CM

## 2022-01-07 MED ORDER — METAXALONE 400 MG PO TABS: 400.0000 mg | ORAL_TABLET | Freq: Three times a day (TID) | ORAL | 0 refills | Status: DC | PRN

## 2022-01-07 NOTE — Therapy (Addendum)
OUTPATIENT PHYSICAL THERAPY THORACOLUMBAR EVALUATION DISCHARGE SUMMARY   Patient Name: [name] MRN: [id number] DOB:[birth year], 56 y.o., female Today's Date: 01/07/2022   PT End of Session - 01/07/22 1244     Visit Number 1    Number of Visits 6    Date for PT Re-Evaluation 02/18/22    Authorization Type [employer] $120 copay, 30 visit limit    PT Start Time 1100    PT Stop Time 1137    PT Time Calculation (min) 37 min    Activity Tolerance Patient tolerated treatment well    Behavior During Therapy [facility] for tasks assessed/performed             Past Medical History:  Diagnosis Date   Anxiety    Family history of pancreatic cancer 03/20/2021   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   Past Surgical History:  Procedure Laterality Date   ABDOMINAL HYSTERECTOMY     APPENDECTOMY     bowel removal     5 inches   BOWEL RESECTION     2 feet   BREAST BIOPSY Right 04/29/2019   APOCRINE METAPLASIA    BREAST LUMPECTOMY WITH RADIOACTIVE SEED LOCALIZATION Left 10/03/2021   Procedure: LEFT BREAST LUMPECTOMY WITH RADIOACTIVE SEED LOCALIZATION;  Surgeon: Griselda Miner, MD;  Location:  SURGERY CENTER;  Service: General;  Laterality: Left;   Patient Active Problem List   Diagnosis Date Noted   Depression 11/18/2021   Osteoporosis 11/18/2021   Abnormal magnetic resonance imaging of left breast 08/06/2021   Diarrhea 05/24/2021   Genetic testing 05/21/2021   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   Preventive measure 04/23/2021   Family history of pancreatic cancer 03/20/2021   CML (chronic myeloid leukemia) ([facility]) 03/01/2021   Family history of breast cancer 03/01/2021   Continuous dependence on cigarette smoking 03/01/2021    PCP: Georgianne Fick, MD  REFERRING PROVIDER: Tarry Kos, MD   REFERRING DIAG: [phone] (ICD-10-CM) - Acute left-sided low back pain with left-sided sciatica M25.552 (ICD-10-CM) - Pain  in left hip   Rationale for Evaluation and Treatment Rehabilitation  THERAPY DIAG:  Other low back pain - Plan: PT plan of care cert/re-cert  Abnormal posture - Plan: PT plan of care cert/re-cert  Muscle weakness (generalized) - Plan: PT plan of care cert/re-cert  ONSET DATE: 5-6 weeks ago  SUBJECTIVE:                                                                                                                                                                                           SUBJECTIVE STATEMENT:  Pt reports she has been doing a lot of yard work and building a retaining wall which flared up her Lt back and hip pain.  She is unable to identify a specific incident but attributes to increased activity.  PERTINENT HISTORY:  CML, depression, osteoporosis  PAIN:  Are you having pain? Yes: NPRS scale: 5-6 currently, at best 2, up to 9/10 Pain location: Lt low back/hip, down to shin Pain description: constant pain Aggravating factors: forward bending, crossing legs, sitting for too long, lying in bed with toes in "wrong position" Relieving factors: pushing foot forward, standing, lying down   PRECAUTIONS: None  WEIGHT BEARING RESTRICTIONS No  FALLS:  Has patient fallen in last 6 months? 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  PATIENT SURVEYS:  01/07/22: FOTO 54 (predicted 63)  SCREENING FOR RED FLAGS: Bowel or bladder incontinence: No Spinal tumors: No  COGNITION:  Overall cognitive status:  Within functional limits for tasks assessed     SENSATION: WFL  POSTURE:  slight lateral shift to Lt  PALPATION: 01/07/22: reproduction of pain at Lt SIJ  LUMBAR ROM:   Active  A/PROM  eval  Flexion WNL   Extension WNL  Right lateral flexion WNL -mild Lt side pain  Left lateral flexion WNL  Right rotation WNL  Left rotation WNL   (Blank rows = not tested)  LOWER EXTREMITY MMT:    MMT Right eval Left eval  Hip flexion    Hip extension  3/5  Hip abduction  3/5  Hip adduction    Hip internal rotation    Hip external rotation     (Blank rows = not tested)  LUMBAR SPECIAL TESTS:  Slump test: Negative  GAIT: 01/06/22: Comments: no significant deviations noted    TODAY'S TREATMENT  01/07/22 See HEP- performed trial reps for comprehension with min cues for technique   PATIENT EDUCATION:  Education details: HEP Person educated: Patient Education method: Explanation, Demonstration, and Handouts Education comprehension: verbalized understanding and returned demonstration   HOME EXERCISE PROGRAM: Access Code: [email or web address] URL: [email or web address] Date: 01/07/2022 Prepared by: Moshe Cipro  Exercises - Supine Piriformis Stretch with Foot on Ground  - 1-2 x daily - 7 x weekly - 1 sets - 3 reps - 30 sec hold - Supine 90/90 Single Leg Isometric Hip Extension  - 1-2 x daily - 7 x weekly - 1 sets - 10 reps - 5 sec hold - Supine Bridge with Resistance Band  - 1-2 x daily - 7 x weekly - 1 sets - 10 reps - 5 seconds hold - Prone Hip Extension  - 1-2 x daily - 7 x weekly - 1 sets - 10 reps - 3-5 sec hold - Prone Alternating Arm and Leg Lifts  - 1-2 x daily - 7 x weekly - 3 sets - 10 reps - 3-5 sec hold  ASSESSMENT:  CLINICAL IMPRESSION: Patient is a 56 y.o. female who was seen today for physical therapy evaluation and treatment for Lt sided LBP with radiculopathy.  She demonstrates some postural abnormalities, continued pain as  well as decreased  strength affecting functional mobility.  Pt will benefit from PT to address deficits listed.    OBJECTIVE IMPAIRMENTS decreased strength, increased muscle spasms, postural dysfunction, and pain.   ACTIVITY LIMITATIONS carrying, lifting, bending, standing, squatting, and locomotion level  PARTICIPATION LIMITATIONS: meal prep, cleaning, laundry, community activity, occupation, and yard work  PERSONAL FACTORS 3+ comorbidities: depression, osteoporosis, CML, smoker  are also affecting patient's functional outcome.   REHAB POTENTIAL: Good  CLINICAL DECISION MAKING: Evolving/moderate complexity  EVALUATION COMPLEXITY: Moderate   GOALS: Goals reviewed with patient? Yes  SHORT TERM GOALS: Target date: 01/28/2022  Independent with initial HEP Goal status: INITIAL  LONG TERM GOALS: Target date: 02/18/2022  Independent with final HEP Goal status: INITIAL  2.  FOTO score improved to 63 Goal status: INITIAL  3.  Lt hip strength improved to 4/5 for improved function Goal status: INIITAL  4.  Report pain < 3/10 with yardwork and work related tasks for improved function Goal status: INITIAL  PLAN: PT FREQUENCY:  Plan to see once every 2-3 weeks but will see up to 1x/wk PRN  PT DURATION: 6 weeks  PLANNED INTERVENTIONS: Therapeutic exercises, Therapeutic activity, Neuromuscular re-education, Gait training, Patient/Family education, Joint mobilization, Dry Needling, Spinal manipulation, Spinal mobilization, Cryotherapy, Moist heat, Taping, Manual therapy, and Re-evaluation.  PLAN FOR NEXT SESSION: review HEP and see how pt is doing, reassess PRN     Clarita Crane, PT, DPT 01/07/22 12:46 PM     PHYSICAL THERAPY DISCHARGE SUMMARY  Visits from Start of Care: 1  Current functional level related to goals / functional outcomes: See above   Remaining deficits: unknown   Education / Equipment: HEP   Patient agrees to discharge. Patient goals were not met. Patient is  being discharged due to not returning since the last visit.   Clarita Crane, PT, DPT 03/28/22 8:31 AM  [facility] [address] [place], [place], [address] Phone: [phone]   Fax:  [phone]

## 2022-01-07 NOTE — Telephone Encounter (Signed)
Sent in

## 2022-01-07 NOTE — Telephone Encounter (Signed)
Pt called and was wondering if she could get a refill on skelaxin   [phone]

## 2022-01-08 ENCOUNTER — Other Ambulatory Visit (HOSPITAL_COMMUNITY): Payer: Self-pay

## 2022-01-11 ENCOUNTER — Other Ambulatory Visit (HOSPITAL_COMMUNITY): Payer: Self-pay

## 2022-01-13 ENCOUNTER — Other Ambulatory Visit (HOSPITAL_COMMUNITY): Payer: Self-pay

## 2022-01-15 ENCOUNTER — Ambulatory Visit (INDEPENDENT_AMBULATORY_CARE_PROVIDER_SITE_OTHER): Payer: BC Managed Care – PPO | Admitting: Psychologist

## 2022-01-15 ENCOUNTER — Other Ambulatory Visit (HOSPITAL_COMMUNITY): Payer: Self-pay

## 2022-01-15 DIAGNOSIS — F411 Generalized anxiety disorder: Secondary | ICD-10-CM

## 2022-01-15 DIAGNOSIS — F330 Major depressive disorder, recurrent, mild: Secondary | ICD-10-CM | POA: Diagnosis not present

## 2022-01-15 NOTE — Progress Notes (Signed)
[place] Counselor/Therapist Progress Note  Patient ID: [name], MRN: [id number],    Date: 01/15/2022  Time Spent: 11:03 am to 11:46 am; total time: 43 minutes   This session was held via in person. The patient consented to in-person therapy and was in the clinician's office. Limits of confidentiality were discussed with the patient.   Treatment Type: Individual Therapy  Reported Symptoms: Some anxiety related to making medical decisions   Mental Status Exam: Appearance:  Well Groomed     Behavior: Appropriate  Motor: Normal  Speech/Language:  Clear and Coherent  Affect: Appropriate  Mood: normal  Thought process: normal  Thought content:   WNL  Sensory/Perceptual disturbances:   WNL  Orientation: oriented to person, place, and time/date  Attention: Good  Concentration: Good  Memory: WNL  Fund of knowledge:  Good  Insight:   Good  Judgment:  Good  Impulse Control: Good   Risk Assessment: Danger to Self:  No Self-injurious Behavior: No Danger to Others: No Duty to Warn:no Physical Aggression / Violence:No  Access to Firearms a concern: No  Gang Involvement:No   Subjective: Beginning the session, patient described herself as doing well and indicated that she met yesterday with the oncologist who specializes in breast cancer. Patient spent the session reflecting on the different treatment options available for her. She processed thoughts and emotions. She was agreeable to homework and following up. She denied suicidal and homicidal ideation.    Interventions:  Worked on developing a therapeutic relationship with the patient using active listening and reflective statements. Provided emotional support using empathy and validation. Used summary statements. Normalized and validated expressed thoughts and emotions. Praised the patient for doing well and explored what has assisted the patient. Reflected on the appointment that the patient had with the  oncologist. Validated patient's experience. Used socratic questions to assist the patient gain insight into self. Began doing a decisional analysis with the patient. Explored how the patient could use exploring values to assist in the decisional analysis process. Processed thoughts and emotions. Provided empathic statements. Assigned homework. Assessed for suicidal and homicidal ideation.   Homework: Decisional analysis about different treatment options using values to assist her.   Next Session: Review homework, and emotional support.   Diagnosis: F41.1 generalized anxiety disorder and F33.0 major depressive affective disorder, recurrent, mild   Plan:   Goals Work through the grieving process and face reality of own death Accept emotional support from others around them Live life to the fullest, event though time may be limited Become as knowledgeable about the medical condition  Reduce fear, anxiety about the health condition  Accept the illness Accept the role of psychological and behavioral factors  Stabilize anxiety level wile increasing ability to function Learn and implement coping skills that result in a reduction of anxiety  Alleviate depressive symptoms Recognize, accept, and cope with depressive feelings Develop healthy thinking patterns Develop healthy interpersonal relationships  Objectives target date for all objectives is 11/23/2022 Identify feelings associated with the illness Family members share with each other feelings Identify the losses or limitations that have been experienced Verbalize acceptance of the reality of the medical condition Commit to learning and implement a proactive approach to managing personal stresses Verbalize an understanding of the medical condition Work with therapist to develop a plan for coping with stress Learn and implement skills for managing stress Engage in social, productive activities that are possible Engage in faith based  activities implement positive imagery Identify coping skills  and sources of emotional support Patient's partner and family members verbalize their fears regarding severity of health condition Identify sources of emotional distress  Learning and implement calming skills to reduce overall anxiety Learn and implement problem solving strategies Identify and engage in pleasant activities Learning and implement personal and interpersonal skills to reduce anxiety and improve interpersonal relationships Learn to accept limitations in life and commit to tolerating, rather than avoiding, unpleasant emotions while accomplishing meaningful goals Identify major life conflicts from the past and present that form the basis for present anxiety Learn and implement behavioral strategies Verbalize an understanding and resolution of current interpersonal problems Learn and implement problem solving and decision making skills Learn and implement conflict resolution skills to resolve interpersonal problems Verbalize an understanding of healthy and unhealthy emotions verbalize insight into how past relationships may be influence current experiences with depression Use mindfulness and acceptance strategies and increase value based behavior  Increase hopeful statements about the future.   Interventions Teach about stress and ways to handle stress Assist the patient in developing a coping action plan for stressors Conduct skills based training for coping strategies Train problem focused skills Sort out what activities the individual can do Encourage patient to rely upon his/her spiritual faith Teach the patient to use guided imagery Probe and evaluate family's ability to provide emotional support Allow family to share their fears Assist the patient in identifying, sorting through, and verbalizing the various feelings generated by his/her medical condition Meet with family members  Ask patient list out  limitations  Use stress inoculation training  Use Acceptance and Commitment Therapy to help client accept uncomfortable realities in order to accomplish value-consistent goals Reinforce the client's insight into the role of his/her past emotional pain and present anxiety  Discuss examples demonstrating that unrealistic worry overestimates the probability of threats and underestimate patient's ability  Assist the patient in analyzing his or her worries Help patient understand that avoidance is reinforcing  Behavioral activation help the client explore the relationship, nature of the dispute,  Help the client develop new interpersonal skills and relationships Conduct Problem so living therapy Teach conflict resolution skills Use a process-experiential approach Conduct TLDP Conduct ACT  The patient and clinician reviewed the treatment plan on 12/20/2021. The patient approved of the treatment plan.   Conception Chancy, PsyD

## 2022-01-16 ENCOUNTER — Other Ambulatory Visit (HOSPITAL_COMMUNITY): Payer: Self-pay

## 2022-01-20 ENCOUNTER — Other Ambulatory Visit (HOSPITAL_COMMUNITY): Payer: Self-pay

## 2022-01-23 ENCOUNTER — Other Ambulatory Visit (HOSPITAL_COMMUNITY): Payer: Self-pay

## 2022-01-27 ENCOUNTER — Ambulatory Visit (INDEPENDENT_AMBULATORY_CARE_PROVIDER_SITE_OTHER): Payer: BC Managed Care – PPO | Admitting: Psychologist

## 2022-01-27 DIAGNOSIS — F411 Generalized anxiety disorder: Secondary | ICD-10-CM

## 2022-01-27 DIAGNOSIS — F330 Major depressive disorder, recurrent, mild: Secondary | ICD-10-CM

## 2022-01-27 NOTE — Progress Notes (Signed)
[facility] Counselor/Therapist Progress Note  Patient ID: [name], MRN: [id number],    Date: 01/27/2022  Time Spent: 10:10 am to 10:49 am; total time: 39 minutes   This session was held via in person. The patient consented to in-person therapy and was in the clinician's office. Limits of confidentiality were discussed with the patient.   Treatment Type: Individual Therapy  Reported Symptoms: Some anxiety related to making medical decisions   Mental Status Exam: Appearance:  Well Groomed     Behavior: Appropriate  Motor: Normal  Speech/Language:  Clear and Coherent  Affect: Appropriate  Mood: normal  Thought process: normal  Thought content:   WNL  Sensory/Perceptual disturbances:   WNL  Orientation: oriented to person, place, and time/date  Attention: Good  Concentration: Good  Memory: WNL  Fund of knowledge:  Good  Insight:   Good  Judgment:  Good  Impulse Control: Good   Risk Assessment: Danger to Self:  No Self-injurious Behavior: No Danger to Others: No Duty to Warn:no Physical Aggression / Violence:No  Access to Firearms a concern: No  Gang Involvement:No   Subjective: Beginning the session, patient described herself as doing well while reflecting on events since the last session. Continuing to talk, she stated that she still has questions she is looking to get answered. She voiced that she has the tendency to make "emotional" decisions and reflected on what this meant to her. She processed on the idea of getting information from multiple sources and how that can assist in making decisions. She processed thoughts and emotions. She asked to follow up. She denied suicidal and homicidal ideation.   Interventions:  Worked on developing a therapeutic relationship with the patient using active listening and reflective statements. Provided emotional support using empathy and validation. Used reflective statements. Praised the patient for doing well  and explored what has assisted. Normalized the need for additional information. Used socratic questions to assist the patient. Used reflective statements in building a therapeutic relationship with the patient. Processed the information she has already received. Briefly provided some psychoeducation to the patient. Explored the idea of getting perspectives from others who have been in similar predicaments.Processed thoughts and emotions. Provided empathic statements. Assessed for suicidal and homicidal ideation.   Homework: Decisional analysis about different treatment options using values to assist her.   Next Session: Review homework, and emotional support.   Diagnosis: F41.1 generalized anxiety disorder and F33.0 major depressive affective disorder, recurrent, mild   Plan:   Goals Work through the grieving process and face reality of own death Accept emotional support from others around them Live life to the fullest, event though time may be limited Become as knowledgeable about the medical condition  Reduce fear, anxiety about the health condition  Accept the illness Accept the role of psychological and behavioral factors  Stabilize anxiety level wile increasing ability to function Learn and implement coping skills that result in a reduction of anxiety  Alleviate depressive symptoms Recognize, accept, and cope with depressive feelings Develop healthy thinking patterns Develop healthy interpersonal relationships  Objectives target date for all objectives is 11/23/2022 Identify feelings associated with the illness Family members share with each other feelings Identify the losses or limitations that have been experienced Verbalize acceptance of the reality of the medical condition Commit to learning and implement a proactive approach to managing personal stresses Verbalize an understanding of the medical condition Work with therapist to develop a plan for coping with stress Learn and  implement  skills for managing stress Engage in social, productive activities that are possible Engage in faith based activities implement positive imagery Identify coping skills and sources of emotional support Patient's partner and family members verbalize their fears regarding severity of health condition Identify sources of emotional distress  Learning and implement calming skills to reduce overall anxiety Learn and implement problem solving strategies Identify and engage in pleasant activities Learning and implement personal and interpersonal skills to reduce anxiety and improve interpersonal relationships Learn to accept limitations in life and commit to tolerating, rather than avoiding, unpleasant emotions while accomplishing meaningful goals Identify major life conflicts from the past and present that form the basis for present anxiety Learn and implement behavioral strategies Verbalize an understanding and resolution of current interpersonal problems Learn and implement problem solving and decision making skills Learn and implement conflict resolution skills to resolve interpersonal problems Verbalize an understanding of healthy and unhealthy emotions verbalize insight into how past relationships may be influence current experiences with depression Use mindfulness and acceptance strategies and increase value based behavior  Increase hopeful statements about the future.   Interventions Teach about stress and ways to handle stress Assist the patient in developing a coping action plan for stressors Conduct skills based training for coping strategies Train problem focused skills Sort out what activities the individual can do Encourage patient to rely upon his/her spiritual faith Teach the patient to use guided imagery Probe and evaluate family's ability to provide emotional support Allow family to share their fears Assist the patient in identifying, sorting through, and verbalizing  the various feelings generated by his/her medical condition Meet with family members  Ask patient list out limitations  Use stress inoculation training  Use Acceptance and Commitment Therapy to help client accept uncomfortable realities in order to accomplish value-consistent goals Reinforce the client's insight into the role of his/her past emotional pain and present anxiety  Discuss examples demonstrating that unrealistic worry overestimates the probability of threats and underestimate patient's ability  Assist the patient in analyzing his or her worries Help patient understand that avoidance is reinforcing  Behavioral activation help the client explore the relationship, nature of the dispute,  Help the client develop new interpersonal skills and relationships Conduct Problem so living therapy Teach conflict resolution skills Use a process-experiential approach Conduct TLDP Conduct ACT  The patient and clinician reviewed the treatment plan on 12/20/2021. The patient approved of the treatment plan.   Conception Chancy, PsyD

## 2022-01-28 ENCOUNTER — Encounter: Payer: BC Managed Care – PPO | Admitting: Physical Therapy

## 2022-01-31 ENCOUNTER — Other Ambulatory Visit (HOSPITAL_COMMUNITY): Payer: Self-pay

## 2022-02-04 ENCOUNTER — Other Ambulatory Visit (HOSPITAL_COMMUNITY): Payer: Self-pay

## 2022-02-07 ENCOUNTER — Other Ambulatory Visit (HOSPITAL_COMMUNITY): Payer: Self-pay

## 2022-02-13 ENCOUNTER — Other Ambulatory Visit (HOSPITAL_COMMUNITY): Payer: Self-pay

## 2022-02-14 ENCOUNTER — Other Ambulatory Visit (HOSPITAL_COMMUNITY): Payer: Self-pay

## 2022-02-14 ENCOUNTER — Other Ambulatory Visit: Payer: Self-pay

## 2022-02-14 DIAGNOSIS — Z803 Family history of malignant neoplasm of breast: Secondary | ICD-10-CM

## 2022-02-17 ENCOUNTER — Inpatient Hospital Stay: Payer: BC Managed Care – PPO | Attending: Hematology and Oncology

## 2022-02-17 ENCOUNTER — Other Ambulatory Visit: Payer: Self-pay

## 2022-02-17 DIAGNOSIS — C9210 Chronic myeloid leukemia, BCR/ABL-positive, not having achieved remission: Secondary | ICD-10-CM | POA: Insufficient documentation

## 2022-02-17 LAB — COMPREHENSIVE METABOLIC PANEL
ALT: 15 U/L (ref 0–44)
AST: 21 U/L (ref 15–41)
Albumin: 4.2 g/dL (ref 3.5–5.0)
Alkaline Phosphatase: 77 U/L (ref 38–126)
Anion gap: 5 (ref 5–15)
BUN: 15 mg/dL (ref 6–20)
CO2: 26 mmol/L (ref 22–32)
Calcium: 8.8 mg/dL — ABNORMAL LOW (ref 8.9–10.3)
Chloride: 109 mmol/L (ref 98–111)
Creatinine, Ser: 0.94 mg/dL (ref 0.44–1.00)
GFR, Estimated: >60 mL/min (ref >60)
Glucose, Bld: 104 mg/dL — ABNORMAL HIGH (ref 70–99)
Potassium: 3.7 mmol/L (ref 3.5–5.1)
Sodium: 140 mmol/L (ref 135–145)
Total Bilirubin: 0.4 mg/dL (ref 0.3–1.2)
Total Protein: 6.4 g/dL — ABNORMAL LOW (ref 6.5–8.1)

## 2022-02-17 LAB — CBC WITH DIFFERENTIAL/PLATELET
Abs Immature Granulocytes: 0.02 10*3/uL (ref 0.00–0.07)
Basophils Absolute: 0.1 10*3/uL (ref 0.0–0.1)
Basophils Relative: 1 %
Eosinophils Absolute: 0.9 10*3/uL — ABNORMAL HIGH (ref 0.0–0.5)
Eosinophils Relative: 12 %
HCT: 36 % (ref 36.0–46.0)
Hemoglobin: 12.7 g/dL (ref 12.0–15.0)
Immature Granulocytes: 0 %
Lymphocytes Relative: 34 %
Lymphs Abs: 2.7 10*3/uL (ref 0.7–4.0)
MCH: 33.3 pg (ref 26.0–34.0)
MCHC: 35.3 g/dL (ref 30.0–36.0)
MCV: 94.5 fL (ref 80.0–100.0)
Monocytes Absolute: 0.4 10*3/uL (ref 0.1–1.0)
Monocytes Relative: 5 %
Neutro Abs: 3.8 10*3/uL (ref 1.7–7.7)
Neutrophils Relative %: 48 %
Platelets: 261 10*3/uL (ref 150–400)
RBC: 3.81 MIL/uL — ABNORMAL LOW (ref 3.87–5.11)
RDW: 12.1 % (ref 11.5–15.5)
WBC: 7.9 10*3/uL (ref 4.0–10.5)
nRBC: 0 % (ref 0.0–0.2)

## 2022-02-19 ENCOUNTER — Ambulatory Visit (INDEPENDENT_AMBULATORY_CARE_PROVIDER_SITE_OTHER): Payer: BC Managed Care – PPO | Admitting: Psychologist

## 2022-02-19 DIAGNOSIS — F411 Generalized anxiety disorder: Secondary | ICD-10-CM

## 2022-02-19 DIAGNOSIS — F330 Major depressive disorder, recurrent, mild: Secondary | ICD-10-CM

## 2022-02-19 NOTE — Progress Notes (Signed)
[facility] Counselor/Therapist Progress Note  Patient ID: [name], MRN: [id number],    Date: 02/19/2022  Time Spent: 01:07 pm to 01:46 pm; total time: 39 minutes   This session was held via in person. The patient consented to in-person therapy and was in the clinician's office. Limits of confidentiality were discussed with the patient.   Treatment Type: Individual Therapy  Reported Symptoms: Some anxiety related to making decisions   Mental Status Exam: Appearance:  Well Groomed     Behavior: Appropriate  Motor: Normal  Speech/Language:  Clear and Coherent  Affect: Appropriate  Mood: normal  Thought process: normal  Thought content:   WNL  Sensory/Perceptual disturbances:   WNL  Orientation: oriented to person, place, and time/date  Attention: Good  Concentration: Good  Memory: WNL  Fund of knowledge:  Good  Insight:   Good  Judgment:  Good  Impulse Control: Good   Risk Assessment: Danger to Self:  No Self-injurious Behavior: No Danger to Others: No Duty to Warn:no Physical Aggression / Violence:No  Access to Firearms a concern: No  Gang Involvement:No   Subjective: Beginning the session, patient described herself as experiencing "static" regarding making decisions. Elaborating, she voiced feeling overwhelmed. She spent the session reflecting on different decisions she needs to make. Patient ended the session indicating that she has decided to reduce her hours of work by 5 hours per week. She processed thoughts and emotions. She asked to follow up. She denied suicidal and homicidal ideation.   Interventions:  Worked on developing a therapeutic relationship with the patient using active listening and reflective statements. Provided emotional support using empathy and validation. Used reflective statements.Reflected on events since the last session. Normalized and validated expressed thoughts and emotions. Processed the idea of what the patient meant  by "static". Normalized and validated thoughts. Used socratic questions to assist the patient. Identified values of the patient and explored how patient could use values to assist in making decisions. Processed emotions related to decisions made. Challenged the the decision made to see how it fit with patient's values. Praised the patient. Assisted in problem solving. Processed thoughts and emotions. Provided empathic statements. Assessed for suicidal and homicidal ideation.   Homework: Reduce number of hours of week by 5 hours.  Next Session: Review homework, and emotional support.   Diagnosis: F41.1 generalized anxiety disorder and F33.0 major depressive affective disorder, recurrent, mild   Plan:   Goals Work through the grieving process and face reality of own death Accept emotional support from others around them Live life to the fullest, event though time may be limited Become as knowledgeable about the medical condition  Reduce fear, anxiety about the health condition  Accept the illness Accept the role of psychological and behavioral factors  Stabilize anxiety level wile increasing ability to function Learn and implement coping skills that result in a reduction of anxiety  Alleviate depressive symptoms Recognize, accept, and cope with depressive feelings Develop healthy thinking patterns Develop healthy interpersonal relationships  Objectives target date for all objectives is 11/23/2022 Identify feelings associated with the illness Family members share with each other feelings Identify the losses or limitations that have been experienced Verbalize acceptance of the reality of the medical condition Commit to learning and implement a proactive approach to managing personal stresses Verbalize an understanding of the medical condition Work with therapist to develop a plan for coping with stress Learn and implement skills for managing stress Engage in social, productive activities  that are  possible Engage in faith based activities implement positive imagery Identify coping skills and sources of emotional support Patient's partner and family members verbalize their fears regarding severity of health condition Identify sources of emotional distress  Learning and implement calming skills to reduce overall anxiety Learn and implement problem solving strategies Identify and engage in pleasant activities Learning and implement personal and interpersonal skills to reduce anxiety and improve interpersonal relationships Learn to accept limitations in life and commit to tolerating, rather than avoiding, unpleasant emotions while accomplishing meaningful goals Identify major life conflicts from the past and present that form the basis for present anxiety Learn and implement behavioral strategies Verbalize an understanding and resolution of current interpersonal problems Learn and implement problem solving and decision making skills Learn and implement conflict resolution skills to resolve interpersonal problems Verbalize an understanding of healthy and unhealthy emotions verbalize insight into how past relationships may be influence current experiences with depression Use mindfulness and acceptance strategies and increase value based behavior  Increase hopeful statements about the future.   Interventions Teach about stress and ways to handle stress Assist the patient in developing a coping action plan for stressors Conduct skills based training for coping strategies Train problem focused skills Sort out what activities the individual can do Encourage patient to rely upon his/her spiritual faith Teach the patient to use guided imagery Probe and evaluate family's ability to provide emotional support Allow family to share their fears Assist the patient in identifying, sorting through, and verbalizing the various feelings generated by his/her medical condition Meet with family  members  Ask patient list out limitations  Use stress inoculation training  Use Acceptance and Commitment Therapy to help client accept uncomfortable realities in order to accomplish value-consistent goals Reinforce the client's insight into the role of his/her past emotional pain and present anxiety  Discuss examples demonstrating that unrealistic worry overestimates the probability of threats and underestimate patient's ability  Assist the patient in analyzing his or her worries Help patient understand that avoidance is reinforcing  Behavioral activation help the client explore the relationship, nature of the dispute,  Help the client develop new interpersonal skills and relationships Conduct Problem so living therapy Teach conflict resolution skills Use a process-experiential approach Conduct TLDP Conduct ACT  The patient and clinician reviewed the treatment plan on 12/20/2021. The patient approved of the treatment plan.   Conception Chancy, PsyD

## 2022-02-22 ENCOUNTER — Other Ambulatory Visit (HOSPITAL_COMMUNITY): Payer: Self-pay

## 2022-02-24 LAB — BCR/ABL

## 2022-02-27 ENCOUNTER — Inpatient Hospital Stay: Payer: BC Managed Care – PPO | Attending: Hematology and Oncology | Admitting: Hematology and Oncology

## 2022-02-27 ENCOUNTER — Other Ambulatory Visit: Payer: Self-pay

## 2022-02-27 ENCOUNTER — Encounter: Payer: Self-pay | Admitting: Hematology and Oncology

## 2022-02-27 DIAGNOSIS — C9210 Chronic myeloid leukemia, BCR/ABL-positive, not having achieved remission: Secondary | ICD-10-CM | POA: Insufficient documentation

## 2022-02-27 DIAGNOSIS — F17210 Nicotine dependence, cigarettes, uncomplicated: Secondary | ICD-10-CM | POA: Diagnosis not present

## 2022-02-27 DIAGNOSIS — Z1501 Genetic susceptibility to malignant neoplasm of breast: Secondary | ICD-10-CM

## 2022-02-27 DIAGNOSIS — M810 Age-related osteoporosis without current pathological fracture: Secondary | ICD-10-CM | POA: Diagnosis not present

## 2022-02-27 DIAGNOSIS — Z79899 Other long term (current) drug therapy: Secondary | ICD-10-CM | POA: Diagnosis not present

## 2022-02-27 DIAGNOSIS — Z1589 Genetic susceptibility to other disease: Secondary | ICD-10-CM

## 2022-02-27 DIAGNOSIS — Z1509 Genetic susceptibility to other malignant neoplasm: Secondary | ICD-10-CM

## 2022-02-27 DIAGNOSIS — Z1502 Genetic susceptibility to malignant neoplasm of ovary: Secondary | ICD-10-CM

## 2022-02-27 NOTE — Assessment & Plan Note (Signed)
She tolerated Sprycel very well Results of her recent test was reviewed which showed she is heading towards major molecular response My recommendation would be to continue on current dose of treatment and we will continue close monitoring until she has reached MMR

## 2022-02-27 NOTE — Progress Notes (Signed)
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[place]) She tolerated Sprycel very well Results of her recent test was reviewed which showed she is heading towards major molecular response My recommendation would be to continue on current dose of treatment and we will continue close monitoring until she has reached MMR  Continuous dependence on cigarette smoking We discussed the importance of nicotine cessation She will try her best to quit smoking  Osteoporosis She has osteoporosis measured by bone density in her forearm and mild osteopenia elsewhere We discussed the role of calcium with vitamin D supplement We also discussed importance of weightbearing exercises as tolerated She will discuss osteoporosis management with her primary doctor  Monoallelic mutation of CHEK2 gene in female patient She is contemplating bilateral mastectomy  No orders of the defined types were placed in this encounter.   All questions were answered. The patient knows to call the clinic with any problems, questions or concerns. The total time spent in the appointment was 20 minutes encounter with patients including review of chart and various tests results, discussions about plan of care and coordination of care plan   [name], MD 02/27/2022 11:56 AM  INTERVAL HISTORY: Please see below for problem oriented charting. she returns for treatment follow-up on Sprycel for CML She is doing well without side-effects She is thinking about B/L mastectomy without reconstruction She had recent back pain; she is concerned about diagnosis of osteoporosis She has not quite smoking yet  REVIEW OF SYSTEMS:   Constitutional: Denies fevers, chills or abnormal weight loss Eyes: Denies blurriness of vision Ears, nose, mouth, throat, and face: Denies mucositis or sore  throat Respiratory: Denies cough, dyspnea or wheezes Cardiovascular: Denies palpitation, chest discomfort or lower extremity swelling Gastrointestinal:  Denies nausea, heartburn or change in bowel habits Skin: Denies abnormal skin rashes Lymphatics: Denies new lymphadenopathy or easy bruising Neurological:Denies numbness, tingling or new weaknesses Behavioral/Psych: Mood is stable, no new changes  All other systems were reviewed with the patient and are negative.  I have reviewed the past medical history, past surgical history, social history and family history with the patient and they are unchanged from previous note.  ALLERGIES:  is allergic to other, sulfa antibiotics, wellbutrin [bupropion], and piperacillin sod-tazobactam so.  MEDICATIONS:  Current Outpatient Medications  Medication Sig Dispense Refill   acetaminophen (TYLENOL) 325 MG tablet Take 325 mg by mouth every 6 (six) hours as needed (pain).      calcium carbonate (TUMS - DOSED IN MG ELEMENTAL CALCIUM) 500 MG chewable tablet Chew 1 tablet by mouth 2 (two) times daily.     cholecalciferol (VITAMIN D3) 25 MCG (1000 UNIT) tablet Take 2,000 Units by mouth daily.     cholestyramine (QUESTRAN) 4 GM/DOSE powder Take 4 g by mouth daily.     clonazePAM (KLONOPIN) 0.5 MG tablet Take 0.5 mg by mouth 2 (two) times daily as needed for anxiety.     dasatinib (SPRYCEL) 70 MG tablet Take 1 tablet (70 mg total) by mouth daily. 30 tablet 11   escitalopram (LEXAPRO) 20 MG tablet Take 30 mg by mouth daily.     ibuprofen (ADVIL) 200 MG tablet Take 200 mg by mouth every 6 (six) hours as needed for moderate pain.      Lactobacillus (PROBIOTIC ACIDOPHILUS PO) Take 1 capsule by mouth daily.     loperamide (IMODIUM) 2 MG capsule  Take 2 mg by mouth daily as needed for diarrhea or loose stools.     LORazepam (ATIVAN) 1 MG tablet Take 1 tablet (1 mg total) by mouth 2 (two) times daily as needed for anxiety. 10 tablet 0   metaxalone (SKELAXIN) 400 MG  tablet Take 1 tablet (400 mg total) by mouth 3 (three) times daily as needed. 15 tablet 0   methocarbamol (ROBAXIN) 500 MG tablet Take 1 tablet  by mouth 2  times daily as needed for muscle spasms. 20 tablet 0   Multiple Vitamin (MULTIVITAMIN WITH MINERALS) TABS tablet Take 1 tablet by mouth daily.     ondansetron (ZOFRAN) 4 MG tablet Take 1 tablet (4 mg total) by mouth every 8 (eight) hours as needed for nausea or vomiting. 30 tablet 0   No current facility-administered medications for this visit.    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[place])   03/01/2021 Pathology Results   BCR/ABL by FISH: 91% positive BRC/ABL by PCR: e13a2 (b2a2) by IS is 196.685%   04/25/2021 -  Chemotherapy   She started taking imatinib   05/15/2021 Pathology Results   BRC/ABL is detected by PCR: e13a2 (b2a2) by IS is 70.731%   06/17/2021 Pathology Results   e13a2 (b2a2) by [employer]idex (IS): 39.7401%   07/24/2021 Pathology Results   e13a2 (b2a2) by [employer]x (IS): 33.3%   08/05/2021 -  Chemotherapy   She started taking Sprycel    08/30/2021 Pathology Results   e13a2 (b2a2) by [employer] (IS): 9.8004%   11/18/2021 Pathology Results   e13a2 (b2a2) by [employer]idex (IS): 0.8987%   12/17/2021 Pathology Results   e13a2 (b2a2) by [employer] (IS): 0.4764%   02/24/2022 Pathology Results   e13a2 (b2a2) by [employer] (IS): 0.2635%     PHYSICAL EXAMINATION: ECOG PERFORMANCE STATUS: 1 - Symptomatic but completely ambulatory  Vitals:   02/27/22 1144  BP: 106/62  Pulse: 76  Resp: 18  SpO2: 97%   Filed Weights   02/27/22 1144  Weight: 145 lb 3.2 oz (65.9 kg)    GENERAL:alert, no distress and comfortable NEURO: alert & oriented x 3 with fluent speech, no focal motor/sensory deficits  LABORATORY DATA:  I have reviewed the data as listed    Component Value Date/Time   NA 140 02/17/2022 1220   K 3.7  02/17/2022 1220   CL 109 02/17/2022 1220   CO2 26 02/17/2022 1220   GLUCOSE 104 (H) 02/17/2022 1220   BUN 15 02/17/2022 1220   CREATININE 0.94 02/17/2022 1220   CALCIUM 8.8 (L) 02/17/2022 1220   PROT 6.4 (L) 02/17/2022 1220   ALBUMIN 4.2 02/17/2022 1220   AST 21 02/17/2022 1220   ALT 15 02/17/2022 1220   ALKPHOS 77 02/17/2022 1220   BILITOT 0.4 02/17/2022 1220   GFRNONAA >60 02/17/2022 1220   GFRAA >60 10/21/2019 1020    No results found for: "SPEP", "UPEP"  Lab Results  Component Value Date   WBC 7.9 02/17/2022   NEUTROABS 3.8 02/17/2022   HGB 12.7 02/17/2022   HCT 36.0 02/17/2022   MCV 94.5 02/17/2022   PLT 261 02/17/2022      Chemistry      Component Value Date/Time   NA 140 02/17/2022 1220   K 3.7 02/17/2022 1220   CL 109 02/17/2022 1220   CO2 26 02/17/2022 1220   BUN 15 02/17/2022 1220   CREATININE 0.94 02/17/2022 1220  Component Value Date/Time   CALCIUM 8.8 (L) 02/17/2022 1220   ALKPHOS 77 02/17/2022 1220   AST 21 02/17/2022 1220   ALT 15 02/17/2022 1220   BILITOT 0.4 02/17/2022 1220

## 2022-02-27 NOTE — Assessment & Plan Note (Signed)
We discussed the importance of nicotine cessation She will try her best to quit smoking

## 2022-02-27 NOTE — Assessment & Plan Note (Signed)
She is contemplating bilateral mastectomy

## 2022-02-27 NOTE — Assessment & Plan Note (Signed)
She has osteoporosis measured by bone density in her forearm and mild osteopenia elsewhere We discussed the role of calcium with vitamin D supplement We also discussed importance of weightbearing exercises as tolerated She will discuss osteoporosis management with her primary doctor

## 2022-03-12 ENCOUNTER — Ambulatory Visit
Admission: RE | Admit: 2022-03-12 | Discharge: 2022-03-12 | Disposition: A | Discharge status: Home / Self Care | Payer: BC Managed Care – PPO | Source: Ambulatory Visit | Attending: *Deleted | Admitting: *Deleted

## 2022-03-12 DIAGNOSIS — Z803 Family history of malignant neoplasm of breast: Secondary | ICD-10-CM

## 2022-03-13 ENCOUNTER — Other Ambulatory Visit (HOSPITAL_COMMUNITY): Payer: Self-pay

## 2022-03-31 ENCOUNTER — Ambulatory Visit: Payer: BC Managed Care – PPO | Admitting: Psychologist

## 2022-03-31 ENCOUNTER — Other Ambulatory Visit (HOSPITAL_COMMUNITY): Payer: Self-pay

## 2022-03-31 MED ORDER — KETOCONAZOLE 2 % EX CREA
TOPICAL_CREAM | CUTANEOUS | 3 refills | Status: AC
  Filled 2022-03-31: qty 30, 28d supply, fill #0
  Filled 2022-08-25: qty 30, 28d supply, fill #1
  Filled 2022-11-12: qty 30, 28d supply, fill #2
  Filled 2023-01-15: qty 30, 28d supply, fill #3

## 2022-04-01 ENCOUNTER — Other Ambulatory Visit (HOSPITAL_COMMUNITY): Payer: Self-pay

## 2022-04-10 ENCOUNTER — Other Ambulatory Visit (HOSPITAL_COMMUNITY): Payer: Self-pay

## 2022-04-14 ENCOUNTER — Inpatient Hospital Stay: Payer: BC Managed Care – PPO | Attending: Hematology and Oncology

## 2022-04-14 ENCOUNTER — Other Ambulatory Visit: Payer: Self-pay

## 2022-04-14 DIAGNOSIS — C9210 Chronic myeloid leukemia, BCR/ABL-positive, not having achieved remission: Secondary | ICD-10-CM | POA: Insufficient documentation

## 2022-04-14 LAB — CBC WITH DIFFERENTIAL/PLATELET
Abs Immature Granulocytes: 0.01 10*3/uL (ref 0.00–0.07)
Basophils Absolute: 0.1 10*3/uL (ref 0.0–0.1)
Basophils Relative: 1 %
Eosinophils Absolute: 0.5 10*3/uL (ref 0.0–0.5)
Eosinophils Relative: 8 %
HCT: 38 % (ref 36.0–46.0)
Hemoglobin: 13.5 g/dL (ref 12.0–15.0)
Immature Granulocytes: 0 %
Lymphocytes Relative: 33 %
Lymphs Abs: 2.3 10*3/uL (ref 0.7–4.0)
MCH: 33.4 pg (ref 26.0–34.0)
MCHC: 35.5 g/dL (ref 30.0–36.0)
MCV: 94.1 fL (ref 80.0–100.0)
Monocytes Absolute: 0.4 10*3/uL (ref 0.1–1.0)
Monocytes Relative: 6 %
Neutro Abs: 3.6 10*3/uL (ref 1.7–7.7)
Neutrophils Relative %: 52 %
Platelets: 270 10*3/uL (ref 150–400)
RBC: 4.04 MIL/uL (ref 3.87–5.11)
RDW: 11.9 % (ref 11.5–15.5)
WBC: 6.9 10*3/uL (ref 4.0–10.5)
nRBC: 0 % (ref 0.0–0.2)

## 2022-04-14 LAB — COMPREHENSIVE METABOLIC PANEL
ALT: 15 U/L (ref 0–44)
AST: 21 U/L (ref 15–41)
Albumin: 4.3 g/dL (ref 3.5–5.0)
Alkaline Phosphatase: 78 U/L (ref 38–126)
Anion gap: 4 — ABNORMAL LOW (ref 5–15)
BUN: 13 mg/dL (ref 6–20)
CO2: 29 mmol/L (ref 22–32)
Calcium: 8.8 mg/dL — ABNORMAL LOW (ref 8.9–10.3)
Chloride: 106 mmol/L (ref 98–111)
Creatinine, Ser: 0.99 mg/dL (ref 0.44–1.00)
GFR, Estimated: >60 mL/min (ref >60)
Glucose, Bld: 114 mg/dL — ABNORMAL HIGH (ref 70–99)
Potassium: 3.9 mmol/L (ref 3.5–5.1)
Sodium: 139 mmol/L (ref 135–145)
Total Bilirubin: 0.4 mg/dL (ref 0.3–1.2)
Total Protein: 6.5 g/dL (ref 6.5–8.1)

## 2022-04-16 ENCOUNTER — Other Ambulatory Visit (HOSPITAL_COMMUNITY): Payer: Self-pay

## 2022-04-18 LAB — BCR/ABL

## 2022-04-23 ENCOUNTER — Other Ambulatory Visit (HOSPITAL_COMMUNITY): Payer: Self-pay

## 2022-04-24 ENCOUNTER — Other Ambulatory Visit (HOSPITAL_COMMUNITY): Payer: Self-pay

## 2022-04-24 ENCOUNTER — Inpatient Hospital Stay: Payer: BC Managed Care – PPO | Attending: Hematology and Oncology | Admitting: Hematology and Oncology

## 2022-04-24 ENCOUNTER — Encounter: Payer: Self-pay | Admitting: Hematology and Oncology

## 2022-04-24 VITALS — BP 118/68 | HR 78 | Temp 98.0°F | Resp 18 | Ht 64.0 in | Wt 143.8 lb

## 2022-04-24 DIAGNOSIS — C9210 Chronic myeloid leukemia, BCR/ABL-positive, not having achieved remission: Secondary | ICD-10-CM | POA: Insufficient documentation

## 2022-04-24 DIAGNOSIS — Z803 Family history of malignant neoplasm of breast: Secondary | ICD-10-CM | POA: Diagnosis not present

## 2022-04-24 MED ORDER — DASATINIB 100 MG PO TABS
100.0000 mg | ORAL_TABLET | Freq: Every day | ORAL | 11 refills | Status: DC
  Filled 2022-04-24 (×2): qty 30, 30d supply, fill #0
  Filled 2022-05-14: qty 30, 30d supply, fill #1

## 2022-04-24 NOTE — Progress Notes (Signed)
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   All questions were answered. The patient knows to call the clinic with any problems, questions or concerns. The total time spent in the appointment was 30 minutes encounter with patients including review of chart and various tests results, discussions about plan of care and coordination of care plan   Natalie Lark, MD 04/24/2022 1:13 PM  INTERVAL HISTORY: Please see below for problem oriented charting. she returns for treatment follow-up on Sprycel for CML She is compliant taking medications as directed Denies missing doses No recent fluid retention She is dealing with a lot of stress recently related to this decision for breast surgery She is not able to ride her horse and she felt pretty depressed about it We have extensive discussions about the risk and benefits of increasing the dose of Sprycel  REVIEW OF SYSTEMS:   Constitutional: Denies fevers, chills or abnormal weight loss Eyes: Denies blurriness of vision Ears, nose, mouth, throat, and face: Denies mucositis or sore throat Respiratory: Denies cough, dyspnea or wheezes Cardiovascular: Denies palpitation, chest discomfort or lower extremity swelling Gastrointestinal:  Denies nausea, heartburn or change in bowel habits Skin: Denies abnormal skin  rashes Lymphatics: Denies new lymphadenopathy or easy bruising Neurological:Denies numbness, tingling or new weaknesses Behavioral/Psych: Mood is stable, no new changes  All other systems were reviewed with the patient and are negative.  I have reviewed the past medical history, past surgical history, social history and family history with the patient and they are unchanged from previous note.  ALLERGIES:  is allergic to other, sulfa antibiotics, wellbutrin [bupropion], and piperacillin sod-tazobactam so.  MEDICATIONS:  Current Outpatient Medications  Medication Sig Dispense Refill   acetaminophen (TYLENOL) 325 MG tablet Take 325 mg by mouth every 6 (six) hours as needed (pain).      calcium carbonate (TUMS - DOSED IN MG ELEMENTAL CALCIUM) 500 MG chewable tablet Chew 1 tablet by mouth 2 (two) times daily.     cholecalciferol (VITAMIN D3) 25 MCG (1000 UNIT) tablet Take 2,000 Units by mouth daily.     clonazePAM (KLONOPIN) 0.5 MG tablet Take 0.5 mg by mouth 2 (two) times daily as needed for anxiety.     dasatinib (SPRYCEL) 100 MG tablet Take 1 tablet (100 mg total) by mouth daily. 30 tablet 11   escitalopram (LEXAPRO) 20 MG tablet Take 30 mg by mouth daily.     ibuprofen (ADVIL) 200 MG tablet Take 200 mg by mouth every 6 (six) hours as needed for moderate pain.      ketoconazole (NIZORAL) 2 % cream Apply Externally twice a day 28 days 30 g 3   Lactobacillus (PROBIOTIC ACIDOPHILUS PO) Take 1 capsule by mouth daily.     loperamide (IMODIUM) 2 MG capsule Take 2 mg by mouth daily as needed for diarrhea or loose stools.     LORazepam (ATIVAN) 1 MG tablet Take 1 tablet (1  mg total) by mouth 2 (two) times daily as needed for anxiety. 10 tablet 0   metaxalone (SKELAXIN) 400 MG tablet Take 1 tablet (400 mg total) by mouth 3 (three) times daily as needed. 15 tablet 0   methocarbamol (ROBAXIN) 500 MG tablet Take 1 tablet  by mouth 2  times daily as needed for muscle spasms. 20 tablet 0   Multiple  Vitamin (MULTIVITAMIN WITH MINERALS) TABS tablet Take 1 tablet by mouth daily.     ondansetron (ZOFRAN) 4 MG tablet Take 1 tablet (4 mg total) by mouth every 8 (eight) hours as needed for nausea or vomiting. 30 tablet 0   No current facility-administered medications for this visit.    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[place])   03/01/2021 Pathology Results   BCR/ABL by FISH: 91% positive BRC/ABL by PCR: e13a2 (b2a2) by IS is 196.685%   04/25/2021 -  Chemotherapy   She started taking imatinib   05/15/2021 Pathology Results   BRC/ABL is detected by PCR: e13a2 (b2a2) by IS is 70.731%   06/17/2021 Pathology Results   e13a2 (b2a2) by Quantidex (IS): 39.7401%   07/24/2021 Pathology Results   e13a2 (b2a2) by Quantidex (IS): 33.3%   08/05/2021 -  Chemotherapy   She started taking Sprycel    08/30/2021 Pathology Results   e13a2 (b2a2) by Quantidex (IS): 9.8004%   11/18/2021 Pathology Results   e13a2 (b2a2) by Quantidex (IS): 0.8987%   12/17/2021 Pathology Results   e13a2 (b2a2) by Quantidex (IS): 0.4764%   02/24/2022 Pathology Results   e13a2 (b2a2) by Quantidex (IS): 0.2635%   04/18/2022 Pathology Results   e13a2 (b2a2) by Quantidex (IS): 0.2739%      PHYSICAL EXAMINATION: ECOG PERFORMANCE STATUS: 1 - Symptomatic but completely ambulatory  Vitals:   04/24/22 1134  BP: 118/68  Pulse: 78  Resp: 18  Temp: 98 F (36.7 C)  SpO2: 100%   Filed Weights   04/24/22 1134  Weight: 143 lb 12.8 oz (65.2 kg)    GENERAL:alert, no distress and comfortable NEURO: alert & oriented x 3 with fluent speech, no focal motor/sensory deficits  LABORATORY DATA:  I have reviewed the data as listed    Component Value Date/Time   NA 139 04/14/2022 1152   K 3.9 04/14/2022 1152   CL 106 04/14/2022 1152   CO2 29 04/14/2022 1152   GLUCOSE 114 (H) 04/14/2022 1152   BUN 13 04/14/2022  1152   CREATININE 0.99 04/14/2022 1152   CALCIUM 8.8 (L) 04/14/2022 1152   PROT 6.5 04/14/2022 1152   ALBUMIN 4.3 04/14/2022 1152   AST 21 04/14/2022 1152   ALT 15 04/14/2022 1152   ALKPHOS 78 04/14/2022 1152   BILITOT 0.4 04/14/2022 1152   GFRNONAA >60 04/14/2022 1152   GFRAA >60 10/21/2019 1020    No results found for: "SPEP", "UPEP"  Lab Results  Component Value Date   WBC 6.9 04/14/2022   NEUTROABS 3.6 04/14/2022   HGB 13.5 04/14/2022   HCT 38.0 04/14/2022   MCV 94.1 04/14/2022   PLT 270 04/14/2022      Chemistry      Component Value Date/Time   NA 139 04/14/2022 1152   K 3.9 04/14/2022 1152   CL 106 04/14/2022 1152   CO2 29 04/14/2022 1152   BUN 13 04/14/2022 1152   CREATININE 0.99 04/14/2022 1152      Component Value Date/Time   CALCIUM  8.8 (L) 04/14/2022 1152   ALKPHOS 78 04/14/2022 1152   AST 21 04/14/2022 1152   ALT 15 04/14/2022 1152   BILITOT 0.4 04/14/2022 1152

## 2022-04-24 NOTE — Assessment & Plan Note (Signed)
Unfortunately, the patient has missed the milestone She is not in major molecular response despite being on Sprycel for 3 months I recommend increasing the dose of Sprycel to 100 mg daily and she is in agreement

## 2022-04-24 NOTE — Assessment & Plan Note (Signed)
We have extensive discussions about the role of prophylactic mastectomy as well as reconstruction surgery The patient is contemplating bilateral mastectomy without reconstruction

## 2022-04-25 ENCOUNTER — Other Ambulatory Visit (HOSPITAL_COMMUNITY): Payer: Self-pay

## 2022-04-28 ENCOUNTER — Other Ambulatory Visit (HOSPITAL_COMMUNITY): Payer: Self-pay

## 2022-04-28 MED ORDER — NA SULFATE-K SULFATE-MG SULF 17.5-3.13-1.6 GM/177ML PO SOLN
ORAL | 0 refills | Status: DC
  Filled 2022-04-28: qty 354, 1d supply, fill #0

## 2022-05-10 ENCOUNTER — Other Ambulatory Visit (HOSPITAL_COMMUNITY): Payer: Self-pay

## 2022-05-13 ENCOUNTER — Other Ambulatory Visit (HOSPITAL_COMMUNITY): Payer: Self-pay

## 2022-05-13 ENCOUNTER — Encounter: Payer: Self-pay | Admitting: Orthopaedic Surgery

## 2022-05-13 ENCOUNTER — Ambulatory Visit (INDEPENDENT_AMBULATORY_CARE_PROVIDER_SITE_OTHER): Payer: BC Managed Care – PPO | Admitting: Orthopaedic Surgery

## 2022-05-13 DIAGNOSIS — M5442 Lumbago with sciatica, left side: Secondary | ICD-10-CM

## 2022-05-13 MED ORDER — PREDNISONE 10 MG (21) PO TBPK
ORAL_TABLET | ORAL | 0 refills | Status: DC
  Filled 2022-05-13: qty 21, 6d supply, fill #0

## 2022-05-13 MED ORDER — METAXALONE 400 MG PO TABS: 400.0000 mg | ORAL_TABLET | Freq: Three times a day (TID) | ORAL | 0 refills | Status: DC | PRN

## 2022-05-13 NOTE — Progress Notes (Signed)
Office Visit Note   Patient: [name]           Date of Birth: 17-Aug-1965           MRN: [id number] Visit Date: 05/13/2022              Requested by: Georgianne Fick, MD [address] [place],  [place] [address] PCP: Georgianne Fick, MD   Assessment & Plan: Visit Diagnoses:  1. Acute left-sided low back pain with left-sided sciatica     Plan: Impression is chronic left-sided low back pain with recurrent flareups.  At this point, the patient has tried several rounds of steroids in addition to NSAIDs and physical therapy without long-lasting relief.  I would like to go ahead and order an MRI to assess for structural abnormalities.  In the meantime, we had discussed sending in a steroid pack to help with the acute pain.  We will call her with the results of the MRI.  Follow-Up Instructions: Return if symptoms worsen or fail to improve.   Orders:  Orders Placed This Encounter  Procedures   MR Lumbar Spine w/o contrast   Meds ordered this encounter  Medications   predniSONE (STERAPRED UNI-PAK 21 TAB) 10 MG (21) TBPK tablet    Sig: Take as directed per package instructions    Dispense:  21 tablet    Refill:  0   metaxalone (SKELAXIN) 400 MG tablet    Sig: Take 1 tablet (400 mg total) by mouth 3 (three) times daily as needed.    Dispense:  15 tablet    Refill:  0      Procedures: No procedures performed   Clinical Data: No additional findings.   Subjective: Chief Complaint  Patient presents with   Left Leg - Pain    HPI patient is a pleasant 56 year old female who comes in today with recurrent left lower back/buttock pain in addition to left leg pain.  She has had multiple flareups in the past which have been relieved with physical therapy and steroids.  This patient recent flareup occurred about a week ago.  She was getting out of her car swelling her left leg over to the side and when she stood up had a terribly sharp pain to the left  lower back.  The pain has started to radiate into the anterior thigh on groin.  This is described as a dull ache.  The sharp pains that she gets to the left lower back seem to occur when she is pushing up on her left leg as well as when she goes from seated to standing position.  She denies any weakness to the left lower extremity.  She feels as though she can walk the pain off.  She has been taking ibuprofen without significant relief.  She denies any paresthesias to the left lower extremity.  She has been to physical therapy in the past which has helped.  No history of ESI.  No bowel or bladder change or saddle paresthesias.  Review of Systems as detailed in HPI.  All others reviewed and are negative.   Objective: Vital Signs: LMP 11/18/2012   Physical Exam well-developed well-nourished female no acute distress.  Alert and oriented x3.  Ortho Exam lumbar spine exam reveals no spinous tenderness.  She does have mild to moderate pain in her left-sided SI joint.  Positive straight leg raise.  4 out of 5 strength with resisted knee flexion.  No pain with lumbar extension or  flexion.  She is neurovascular intact distally.  Specialty Comments:  No specialty comments available.  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   Preventive measure 04/23/2021   Family history of pancreatic cancer 03/20/2021   CML (chronic myeloid leukemia) ([facility]) 03/01/2021   Family history of breast cancer 03/01/2021   Continuous dependence on cigarette smoking 03/01/2021   Past Medical History:  Diagnosis Date   Anxiety    Family history of pancreatic cancer 03/20/2021   Monoallelic mutation of CHEK2 gene in female patient  05/21/2021    Family History  Problem Relation Age of Onset   Breast cancer Mother        bilateral; dx 29, 46   Acute myelogenous leukemia Mother        dx late 76s   Pancreatic cancer Father 74   Breast cancer Maternal Aunt        dx after 50   Breast cancer Maternal Aunt        dx after 50   Bladder Cancer Paternal Uncle        dx after 4   Colon cancer Maternal Grandmother 58    Past Surgical History:  Procedure Laterality Date   ABDOMINAL HYSTERECTOMY     APPENDECTOMY     bowel removal     5 inches   BOWEL RESECTION     2 feet   BREAST BIOPSY Right 04/29/2019   APOCRINE METAPLASIA    BREAST LUMPECTOMY WITH RADIOACTIVE SEED LOCALIZATION Left 10/03/2021   Procedure: LEFT BREAST LUMPECTOMY WITH RADIOACTIVE SEED LOCALIZATION;  Surgeon: Griselda Miner, MD;  Location: [facility];  Service: General;  Laterality: Left;   Social History   Occupational History   Occupation: retired  Tobacco Use   Smoking status: Every Day    Packs/day: 1.00    Years: 17.00    Total pack years: 17.00    Types: Cigarettes   Smokeless tobacco: Never  Substance and Sexual Activity   Alcohol use: No   Drug use: No   Sexual activity: Yes

## 2022-05-14 ENCOUNTER — Other Ambulatory Visit: Payer: Self-pay | Admitting: Physician Assistant

## 2022-05-14 ENCOUNTER — Other Ambulatory Visit (HOSPITAL_COMMUNITY): Payer: Self-pay

## 2022-05-14 ENCOUNTER — Telehealth: Payer: Self-pay | Admitting: Orthopedic Surgery

## 2022-05-14 MED ORDER — METAXALONE 400 MG PO TABS
400.0000 mg | ORAL_TABLET | Freq: Three times a day (TID) | ORAL | 0 refills | Status: DC | PRN
  Filled 2022-05-14: qty 20, 7d supply, fill #0

## 2022-05-14 NOTE — Telephone Encounter (Signed)
Oh no, please tell her that I am sorry.  I have resent this

## 2022-05-14 NOTE — Telephone Encounter (Signed)
Pt called in stating that Dr. Erlinda Hong prescribe her medication (metaxalone (SKELAXIN) 400 MG tablet).... Pt stated that she went to the pharmacy to pick up medication and pharmacy advised pt that script wasn't sent over.... Pt is requesting for script for medication (metaxalone (SKELAXIN) 400 MG tablet) to be sent to [facility] at [place], [place].... Pt requesting callback.Marland KitchenMarland Kitchen

## 2022-05-15 ENCOUNTER — Other Ambulatory Visit (HOSPITAL_COMMUNITY): Payer: Self-pay

## 2022-05-15 NOTE — Telephone Encounter (Signed)
Called and [facility] for patient that medication has been sent in.

## 2022-05-16 ENCOUNTER — Ambulatory Visit: Payer: BC Managed Care – PPO | Admitting: Orthopaedic Surgery

## 2022-05-21 ENCOUNTER — Other Ambulatory Visit (HOSPITAL_COMMUNITY): Payer: Self-pay

## 2022-05-22 ENCOUNTER — Ambulatory Visit
Admission: RE | Admit: 2022-05-22 | Discharge: 2022-05-22 | Disposition: A | Discharge status: Home / Self Care | Payer: BC Managed Care – PPO | Source: Ambulatory Visit | Attending: Orthopaedic Surgery | Admitting: Orthopaedic Surgery

## 2022-05-22 DIAGNOSIS — M5442 Lumbago with sciatica, left side: Secondary | ICD-10-CM

## 2022-05-24 ENCOUNTER — Other Ambulatory Visit: Payer: BC Managed Care – PPO

## 2022-05-26 ENCOUNTER — Encounter: Payer: Self-pay | Admitting: Orthopaedic Surgery

## 2022-05-26 ENCOUNTER — Other Ambulatory Visit: Payer: Self-pay

## 2022-05-26 DIAGNOSIS — M5442 Lumbago with sciatica, left side: Secondary | ICD-10-CM

## 2022-05-26 NOTE — Progress Notes (Signed)
Please order Lsp ESI with [facility] thanks.

## 2022-05-27 ENCOUNTER — Other Ambulatory Visit: Payer: Self-pay

## 2022-05-27 DIAGNOSIS — M5416 Radiculopathy, lumbar region: Secondary | ICD-10-CM

## 2022-05-29 ENCOUNTER — Inpatient Hospital Stay: Payer: BC Managed Care – PPO | Attending: Hematology and Oncology

## 2022-05-29 ENCOUNTER — Telehealth: Payer: Self-pay | Admitting: Physical Medicine and Rehabilitation

## 2022-05-29 DIAGNOSIS — C9210 Chronic myeloid leukemia, BCR/ABL-positive, not having achieved remission: Secondary | ICD-10-CM | POA: Insufficient documentation

## 2022-05-29 LAB — COMPREHENSIVE METABOLIC PANEL
ALT: 19 U/L (ref 0–44)
AST: 22 U/L (ref 15–41)
Albumin: 4.3 g/dL (ref 3.5–5.0)
Alkaline Phosphatase: 84 U/L (ref 38–126)
Anion gap: 7 (ref 5–15)
BUN: 13 mg/dL (ref 6–20)
CO2: 26 mmol/L (ref 22–32)
Calcium: 9 mg/dL (ref 8.9–10.3)
Chloride: 105 mmol/L (ref 98–111)
Creatinine, Ser: 1.02 mg/dL — ABNORMAL HIGH (ref 0.44–1.00)
GFR, Estimated: >60 mL/min (ref >60)
Glucose, Bld: 111 mg/dL — ABNORMAL HIGH (ref 70–99)
Potassium: 3.8 mmol/L (ref 3.5–5.1)
Sodium: 138 mmol/L (ref 135–145)
Total Bilirubin: 0.4 mg/dL (ref 0.3–1.2)
Total Protein: 6.7 g/dL (ref 6.5–8.1)

## 2022-05-29 LAB — CBC WITH DIFFERENTIAL/PLATELET
Abs Immature Granulocytes: 0.01 10*3/uL (ref 0.00–0.07)
Basophils Absolute: 0.1 10*3/uL (ref 0.0–0.1)
Basophils Relative: 1 %
Eosinophils Absolute: 0.3 10*3/uL (ref 0.0–0.5)
Eosinophils Relative: 3 %
HCT: 39.8 % (ref 36.0–46.0)
Hemoglobin: 14 g/dL (ref 12.0–15.0)
Immature Granulocytes: 0 %
Lymphocytes Relative: 32 %
Lymphs Abs: 2.4 10*3/uL (ref 0.7–4.0)
MCH: 33.3 pg (ref 26.0–34.0)
MCHC: 35.2 g/dL (ref 30.0–36.0)
MCV: 94.5 fL (ref 80.0–100.0)
Monocytes Absolute: 0.4 10*3/uL (ref 0.1–1.0)
Monocytes Relative: 5 %
Neutro Abs: 4.4 10*3/uL (ref 1.7–7.7)
Neutrophils Relative %: 59 %
Platelets: 233 10*3/uL (ref 150–400)
RBC: 4.21 MIL/uL (ref 3.87–5.11)
RDW: 12.4 % (ref 11.5–15.5)
WBC: 7.6 10*3/uL (ref 4.0–10.5)
nRBC: 0 % (ref 0.0–0.2)

## 2022-05-29 NOTE — Telephone Encounter (Signed)
Pt called in stating that someone called her and left a message... Pt stated that she doesn't remember who it was that left the message.. Pt stated that the message stated to reschedule injection appt... Pt stated that she will be at work around 2:30pm but will try to go on break around 5pm... Pt requesting callback.Marland KitchenMarland Kitchen

## 2022-06-02 NOTE — Telephone Encounter (Signed)
Patient is scheduled for injection on 06/09/22

## 2022-06-03 LAB — BCR/ABL

## 2022-06-09 ENCOUNTER — Ambulatory Visit: Payer: Self-pay

## 2022-06-09 ENCOUNTER — Ambulatory Visit (INDEPENDENT_AMBULATORY_CARE_PROVIDER_SITE_OTHER): Payer: BC Managed Care – PPO | Admitting: Physical Medicine and Rehabilitation

## 2022-06-09 VITALS — BP 110/71 | HR 79

## 2022-06-09 DIAGNOSIS — M5416 Radiculopathy, lumbar region: Secondary | ICD-10-CM | POA: Diagnosis not present

## 2022-06-09 MED ORDER — METHYLPREDNISOLONE ACETATE 80 MG/ML IJ SUSP: 40.0000 mg | Freq: Once | INTRAMUSCULAR | Status: DC

## 2022-06-09 NOTE — Patient Instructions (Signed)

## 2022-06-09 NOTE — Progress Notes (Signed)
ESI lumbar spine, radiates to left hip

## 2022-06-10 ENCOUNTER — Inpatient Hospital Stay (HOSPITAL_BASED_OUTPATIENT_CLINIC_OR_DEPARTMENT_OTHER): Payer: BC Managed Care – PPO | Admitting: Hematology and Oncology

## 2022-06-10 ENCOUNTER — Other Ambulatory Visit (HOSPITAL_COMMUNITY): Payer: Self-pay

## 2022-06-10 ENCOUNTER — Encounter: Payer: Self-pay | Admitting: Hematology and Oncology

## 2022-06-10 VITALS — BP 121/72 | HR 88 | Temp 99.2°F | Resp 18 | Ht 64.0 in | Wt 145.2 lb

## 2022-06-10 DIAGNOSIS — C9210 Chronic myeloid leukemia, BCR/ABL-positive, not having achieved remission: Secondary | ICD-10-CM

## 2022-06-10 MED ORDER — DASATINIB 140 MG PO TABS
140.0000 mg | ORAL_TABLET | Freq: Every day | ORAL | 11 refills | Status: DC
  Filled 2022-06-10: qty 30, 30d supply, fill #0
  Filled 2022-07-18: qty 14, 14d supply, fill #1
  Filled 2022-07-28: qty 14, 14d supply, fill #2
  Filled 2022-08-11: qty 14, 14d supply, fill #3
  Filled 2022-08-25 – 2022-08-26 (×2): qty 14, 14d supply, fill #4
  Filled 2022-09-11: qty 14, 14d supply, fill #5
  Filled 2022-09-26: qty 14, 14d supply, fill #6
  Filled 2022-10-15: qty 30, 30d supply, fill #7
  Filled 2022-11-05: qty 30, 30d supply, fill #8
  Filled 2022-12-05: qty 30, 30d supply, fill #9
  Filled 2023-01-06: qty 30, 30d supply, fill #10
  Filled 2023-02-06: qty 30, 30d supply, fill #11
  Filled 2023-03-05: qty 30, 30d supply, fill #12
  Filled 2023-04-08: qty 30, 30d supply, fill #13
  Filled 2023-05-11: qty 30, 30d supply, fill #14

## 2022-06-10 NOTE — Progress Notes (Signed)
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[place]) Unfortunately, she has not obtained major molecular response despite recent change in the dose of Dasatinib The patient thinks this could be related to binding effect from taking oral calcium supplement close to the timing of her Dasatinib I recommend increasing the dose to 140 mg and to avoid taking any medications around the time of her Dasatinib I will also order ABL kinase mutation study to rule out resistant to Dasatinib I will see her in January for further follow-up  Orders Placed This Encounter  Procedures   BCR-ABL1 Kinase domain mutation analysis    Standing Status:   Future    Standing Expiration Date:   06/11/2023   CMP ([place] only)    Standing Status:   Future    Standing Expiration Date:   06/11/2023   CBC with Differential ([place] Only)    Standing Status:   Future    Standing Expiration Date:   06/11/2023   BCR-ABL    With RT-PCR technique    Standing Status:   Future    Standing Expiration Date:   06/11/2023    All questions were answered. The patient knows to call the clinic with any problems, questions or concerns. The total time spent in the appointment was 20 minutes encounter with patients including review of chart and various tests results, discussions about plan of care and coordination of care plan   [name], MD 06/10/2022 3:06 PM  INTERVAL HISTORY: Please see below for problem oriented charting. she returns for treatment follow-up on Sprycel for CML She had recent joint injection on her back due to back pain She denies recent infection She denies missing doses  REVIEW OF SYSTEMS:   Constitutional: Denies fevers, chills or abnormal weight loss Eyes: Denies blurriness of vision Ears, nose, mouth, throat, and face: Denies mucositis or sore  throat Respiratory: Denies cough, dyspnea or wheezes Cardiovascular: Denies palpitation, chest discomfort or lower extremity swelling Gastrointestinal:  Denies nausea, heartburn or change in bowel habits Skin: Denies abnormal skin rashes Lymphatics: Denies new lymphadenopathy or easy bruising Neurological:Denies numbness, tingling or new weaknesses Behavioral/Psych: Mood is stable, no new changes  All other systems were reviewed with the patient and are negative.  I have reviewed the past medical history, past surgical history, social history and family history with the patient and they are unchanged from previous note.  ALLERGIES:  is allergic to other, sulfa antibiotics, wellbutrin [bupropion], and piperacillin sod-tazobactam so.  MEDICATIONS:  Current Outpatient Medications  Medication Sig Dispense Refill   acetaminophen (TYLENOL) 325 MG tablet Take 325 mg by mouth every 6 (six) hours as needed (pain).      calcium carbonate (TUMS - DOSED IN MG ELEMENTAL CALCIUM) 500 MG chewable tablet Chew 1 tablet by mouth 2 (two) times daily.     cholecalciferol (VITAMIN D3) 25 MCG (1000 UNIT) tablet Take 2,000 Units by mouth daily.     clonazePAM (KLONOPIN) 0.5 MG tablet Take 0.5 mg by mouth 2 (two) times daily as needed for anxiety.     dasatinib (SPRYCEL) 140 MG tablet Take 1 tablet (140 mg total) by mouth daily. 30 tablet 11   escitalopram (LEXAPRO) 20 MG tablet Take 30 mg by mouth daily.     ibuprofen (ADVIL) 200 MG tablet Take 200 mg by mouth every 6 (six) hours as needed for  moderate pain.      ketoconazole (NIZORAL) 2 % cream Apply Externally twice a day 28 days 30 g 3   Lactobacillus (PROBIOTIC ACIDOPHILUS PO) Take 1 capsule by mouth daily.     loperamide (IMODIUM) 2 MG capsule Take 2 mg by mouth daily as needed for diarrhea or loose stools.     LORazepam (ATIVAN) 1 MG tablet Take 1 tablet (1 mg total) by mouth 2 (two) times daily as needed for anxiety. 10 tablet 0   metaxalone (SKELAXIN)  400 MG tablet Take 1 tablet (400 mg total) by mouth 3 (three) times daily as needed. 20 tablet 0   methocarbamol (ROBAXIN) 500 MG tablet Take 1 tablet  by mouth 2  times daily as needed for muscle spasms. 20 tablet 0   Multiple Vitamin (MULTIVITAMIN WITH MINERALS) TABS tablet Take 1 tablet by mouth daily.     Na Sulfate-K Sulfate-Mg Sulf 17.5-3.13-1.6 GM/177ML SOLN One kit contains 2 bottles.  Take both bottles by mouth at the times instructed by your provider. 354 mL 0   ondansetron (ZOFRAN) 4 MG tablet Take 1 tablet (4 mg total) by mouth every 8 (eight) hours as needed for nausea or vomiting. 30 tablet 0   No current facility-administered medications for this visit.    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[place])   03/01/2021 Pathology Results   BCR/ABL by FISH: 91% positive BRC/ABL by PCR: e13a2 (b2a2) by IS is 196.685%   04/25/2021 -  Chemotherapy   She started taking imatinib   05/15/2021 Pathology Results   BRC/ABL is detected by PCR: e13a2 (b2a2) by IS is 70.731%   06/17/2021 Pathology Results   e13a2 (b2a2) by Quantidex (IS): 39.7401%   07/24/2021 Pathology Results   e13a2 (b2a2) by Quantidex (IS): 33.3%   08/05/2021 -  Chemotherapy   She started taking Sprycel    08/30/2021 Pathology Results   e13a2 (b2a2) by Quantidex (IS): 9.8004%   11/18/2021 Pathology Results   e13a2 (b2a2) by Quantidex (IS): 0.8987%   12/17/2021 Pathology Results   e13a2 (b2a2) by Quantidex (IS): 0.4764%   02/24/2022 Pathology Results   e13a2 (b2a2) by Quantidex (IS): 0.2635%   04/18/2022 Pathology Results   e13a2 (b2a2) by Quantidex (IS): 0.2739%    05/27/2022 Pathology Results   e13a2 (b2a2) by Quantidex (IS): 0.3033       PHYSICAL EXAMINATION: ECOG PERFORMANCE STATUS: 0 - Asymptomatic  Vitals:   06/10/22 1137  BP: 121/72  Pulse: 88  Resp: 18  Temp: 99.2 F (37.3 C)  SpO2: 98%    Filed Weights   06/10/22 1137  Weight: 145 lb 3.2 oz (65.9 kg)    GENERAL:alert, no distress and comfortable NEURO: alert & oriented x 3 with fluent speech, no focal motor/sensory deficits  LABORATORY DATA:  I have reviewed the data as listed    Component Value Date/Time   NA 138 05/29/2022 1226   K 3.8 05/29/2022 1226   CL 105 05/29/2022 1226   CO2 26 05/29/2022 1226   GLUCOSE 111 (H) 05/29/2022 1226   BUN 13 05/29/2022 1226   CREATININE 1.02 (H) 05/29/2022 1226   CALCIUM 9.0 05/29/2022 1226   PROT 6.7 05/29/2022 1226   ALBUMIN 4.3 05/29/2022 1226   AST 22 05/29/2022 1226   ALT 19 05/29/2022 1226   ALKPHOS 84 05/29/2022 1226   BILITOT 0.4 05/29/2022 1226   GFRNONAA >60 05/29/2022 1226  GFRAA >60 10/21/2019 1020    No results found for: "SPEP", "UPEP"  Lab Results  Component Value Date   WBC 7.6 05/29/2022   NEUTROABS 4.4 05/29/2022   HGB 14.0 05/29/2022   HCT 39.8 05/29/2022   MCV 94.5 05/29/2022   PLT 233 05/29/2022      Chemistry      Component Value Date/Time   NA 138 05/29/2022 1226   K 3.8 05/29/2022 1226   CL 105 05/29/2022 1226   CO2 26 05/29/2022 1226   BUN 13 05/29/2022 1226   CREATININE 1.02 (H) 05/29/2022 1226      Component Value Date/Time   CALCIUM 9.0 05/29/2022 1226   ALKPHOS 84 05/29/2022 1226   AST 22 05/29/2022 1226   ALT 19 05/29/2022 1226   BILITOT 0.4 05/29/2022 1226       RADIOGRAPHIC STUDIES: I have personally reviewed the radiological images as listed and agreed with the findings in the report. XR C-ARM NO REPORT  Result Date: 06/09/2022 Please see Notes tab for imaging impression.  MR Lumbar Spine w/o contrast  Result Date: 05/25/2022 CLINICAL DATA:  Lower back pain. Acute left-sided low back pain with left-sided sciatica. EXAM: MRI LUMBAR SPINE WITHOUT CONTRAST TECHNIQUE: Multiplanar, multisequence MR imaging of the lumbar spine was performed. No intravenous contrast was administered. COMPARISON:  Radiographs Dec 17, 2021. FINDINGS: Segmentation:  Standard. Alignment: Levoconvex scoliosis. Small retrolisthesis at L1-2 and L2-3. Vertebrae: No fracture, evidence of discitis, or bone lesion. Endplate degenerative changes at L2-3 and L4-5. Conus medullaris and cauda equina: Conus extends to the L1 level. Conus and cauda equina appear normal. Paraspinal and other soft tissues: Negative. Disc levels: T11-12: Small left central disc protrusion causing small indentation of the thecal sac without significant spinal canal or neural foraminal stenosis. T12-L1: No spinal canal or neural foraminal stenosis. L1-2: Loss of disc height, disc bulge and mild facet degenerative changes resulting in mild bilateral neural foraminal narrowing. No spinal canal stenosis. L2-3: Loss of disc height, disc bulge and mild facet degenerative changes resulting in mild bilateral neural foraminal narrowing. No significant spinal canal stenosis. L3-4: Loss of disc height, right asymmetric disc bulge and mild facet degenerative change resulting in mild spinal canal stenosis with moderate right and mild left subarticular zone stenosis and mild right neural foraminal. L4-5: Loss of disc height, disc bulge, mild right and moderate hypertrophic left facet degenerative changes resulting in mild right and moderate left subarticular zone stenosis, mild right and moderate left neural foraminal narrowing. L5-S1: Shallow disc bulge and moderate facet degenerative changes without significant spinal canal or neural foraminal stenosis. IMPRESSION: 1. 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 2. Moderate right subarticular zone stenosis and mild right neural foraminal narrowing at L3-4. 3. Mild bilateral neural foraminal narrowing at L1-2 and L2-3. Electronically Signed   By: Pedro Earls M.D.   On: 05/25/2022 13:23

## 2022-06-10 NOTE — Assessment & Plan Note (Signed)
Unfortunately, she has not obtained major molecular response despite recent change in the dose of Dasatinib The patient thinks this could be related to binding effect from taking oral calcium supplement close to the timing of her Dasatinib I recommend increasing the dose to 140 mg and to avoid taking any medications around the time of her Dasatinib I will also order ABL kinase mutation study to rule out resistant to Dasatinib I will see her in January for further follow-up

## 2022-06-11 ENCOUNTER — Other Ambulatory Visit (HOSPITAL_COMMUNITY): Payer: Self-pay

## 2022-06-17 NOTE — Procedures (Signed)
Lumbar Epidural Steroid Injection - Interlaminar Approach with Fluoroscopic Guidance  Patient: [name]      Date of Birth: 07-02-1966 MRN: [id number] PCP: Merrilee Seashore, MD      Visit Date: 06/09/2022   Universal Protocol:     Consent Given By: the patient  Position: PRONE  Additional Comments: Vital signs were monitored before and after the procedure. Patient was prepped and draped in the usual sterile fashion. The correct patient, procedure, and site was verified.   Injection Procedure Details:   Procedure diagnoses: Lumbar radiculopathy [M54.16]   Meds Administered:  Meds ordered this encounter  Medications   DISCONTD: methylPREDNISolone acetate (DEPO-MEDROL) injection 40 mg     Laterality: Left  Location/Site:  L4-5  Needle: 3.5 in., 20 ga. Tuohy  Needle Placement: Paramedian epidural  Findings:   -Comments: Excellent flow of contrast into the epidural space.  Procedure Details: Using a paramedian approach from the side mentioned above, the region overlying the inferior lamina was localized under fluoroscopic visualization and the soft tissues overlying this structure were infiltrated with 4 ml. of 1% Lidocaine without Epinephrine. The Tuohy needle was inserted into the epidural space using a paramedian approach.   The epidural space was localized using loss of resistance along with counter oblique bi-planar fluoroscopic views.  After negative aspirate for air, blood, and CSF, a 2 ml. volume of Isovue-250 was injected into the epidural space and the flow of contrast was observed. Radiographs were obtained for documentation purposes.    The injectate was administered into the level noted above.   Additional Comments:  The patient tolerated the procedure well Dressing: 2 x 2 sterile gauze and Band-Aid    Post-procedure details: Patient was observed during the procedure. Post-procedure instructions were reviewed.  Patient left the clinic in  stable condition.

## 2022-06-17 NOTE — Progress Notes (Signed)
[name] - 56 y.o. female MRN [id number]  Date of birth: [birth year]  Office Visit Note: Visit Date: 06/09/2022 PCP: Georgianne Fick, MD Referred by: Tyrell Antonio, MD  Subjective: Chief Complaint  Patient presents with   Lower Back - Pain    Radiates to left hip,   HPI:  [name] is a 56 y.o. female who comes in today at the request of Dr. Glee Arvin for planned Left L4-5 Lumbar Interlaminar epidural steroid injection with fluoroscopic guidance.  The patient has failed conservative care including home exercise, medications, time and activity modification.  This injection will be diagnostic and hopefully therapeutic.  Please see requesting physician notes for further details and justification. MRI reviewed with images and spine model.  MRI reviewed in the note below.    ROS Otherwise per HPI.  Assessment & Plan: Visit Diagnoses:    ICD-10-CM   1. Lumbar radiculopathy  M54.16 XR C-ARM NO REPORT    Epidural Steroid injection    DISCONTINUED: methylPREDNISolone acetate (DEPO-MEDROL) injection 40 mg      Plan: No additional findings.   Meds & Orders:  Meds ordered this encounter  Medications   DISCONTD: methylPREDNISolone acetate (DEPO-MEDROL) injection 40 mg    Orders Placed This Encounter  Procedures   XR C-ARM NO REPORT   Epidural Steroid injection    Follow-up: Return for visit to requesting provider as needed.   Procedures: No procedures performed  Lumbar Epidural Steroid Injection - Interlaminar Approach with Fluoroscopic Guidance  Patient: [name]      Date of Birth: 1965-09-10 MRN: [id number] PCP: Georgianne Fick, MD      Visit Date: 06/09/2022   Universal Protocol:     Consent Given By: the patient  Position: PRONE  Additional Comments: Vital signs were monitored before and after the procedure. Patient was prepped and draped in the usual sterile fashion. The correct patient, procedure, and site was  verified.   Injection Procedure Details:   Procedure diagnoses: Lumbar radiculopathy [M54.16]   Meds Administered:  Meds ordered this encounter  Medications   DISCONTD: methylPREDNISolone acetate (DEPO-MEDROL) injection 40 mg     Laterality: Left  Location/Site:  L4-5  Needle: 3.5 in., 20 ga. Tuohy  Needle Placement: Paramedian epidural  Findings:   -Comments: Excellent flow of contrast into the epidural space.  Procedure Details: Using a paramedian approach from the side mentioned above, the region overlying the inferior lamina was localized under fluoroscopic visualization and the soft tissues overlying this structure were infiltrated with 4 ml. of 1% Lidocaine without Epinephrine. The Tuohy needle was inserted into the epidural space using a paramedian approach.   The epidural space was localized using loss of resistance along with counter oblique bi-planar fluoroscopic views.  After negative aspirate for air, blood, and CSF, a 2 ml. volume of Isovue-250 was injected into the epidural space and the flow of contrast was observed. Radiographs were obtained for documentation purposes.    The injectate was administered into the level noted above.   Additional Comments:  The patient tolerated the procedure well Dressing: 2 x 2 sterile gauze and Band-Aid    Post-procedure details: Patient was observed during the procedure. Post-procedure instructions were reviewed.  Patient left the clinic in stable condition.   Clinical History: Narrative & Impression CLINICAL DATA:  Lower back pain. Acute left-sided low back pain with left-sided sciatica.   EXAM: MRI LUMBAR SPINE WITHOUT CONTRAST   TECHNIQUE: Multiplanar, multisequence MR imaging of the lumbar spine  was performed. No intravenous contrast was administered.   COMPARISON:  Radiographs Dec 17, 2021.   FINDINGS: Segmentation:  Standard.   Alignment: Levoconvex scoliosis. Small retrolisthesis at L1-2 and L2-3.    Vertebrae: No fracture, evidence of discitis, or bone lesion. Endplate degenerative changes at L2-3 and L4-5.   Conus medullaris and cauda equina: Conus extends to the L1 level. Conus and cauda equina appear normal.   Paraspinal and other soft tissues: Negative.   Disc levels:   T11-12: Small left central disc protrusion causing small indentation of the thecal sac without significant spinal canal or neural foraminal stenosis.   T12-L1: No spinal canal or neural foraminal stenosis.   L1-2: Loss of disc height, disc bulge and mild facet degenerative changes resulting in mild bilateral neural foraminal narrowing. No spinal canal stenosis.   L2-3: Loss of disc height, disc bulge and mild facet degenerative changes resulting in mild bilateral neural foraminal narrowing. No significant spinal canal stenosis.   L3-4: Loss of disc height, right asymmetric disc bulge and mild facet degenerative change resulting in mild spinal canal stenosis with moderate right and mild left subarticular zone stenosis and mild right neural foraminal.   L4-5: Loss of disc height, disc bulge, mild right and moderate hypertrophic left facet degenerative changes resulting in mild right and moderate left subarticular zone stenosis, mild right and moderate left neural foraminal narrowing.   L5-S1: Shallow disc bulge and moderate facet degenerative changes without significant spinal canal or neural foraminal stenosis.   IMPRESSION: 1. 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 2. Moderate right subarticular zone stenosis and mild right neural foraminal narrowing at L3-4. 3. Mild bilateral neural foraminal narrowing at L1-2 and L2-3.     Electronically Signed   By: Baldemar Lenis M.D.   On: 05/25/2022 13:23     Objective:  VS:  HT:    WT:   BMI:     BP:110/71  HR:79bpm  TEMP: ( )  RESP:  Physical Exam Vitals and nursing note  reviewed.  Constitutional:      General: She is not in acute distress.    Appearance: Normal appearance. She is not ill-appearing.  HENT:     Head: Normocephalic and atraumatic.     Right Ear: External ear normal.     Left Ear: External ear normal.  Eyes:     Extraocular Movements: Extraocular movements intact.  Cardiovascular:     Rate and Rhythm: Normal rate.     Pulses: Normal pulses.  Pulmonary:     Effort: Pulmonary effort is normal. No respiratory distress.  Abdominal:     General: There is no distension.     Palpations: Abdomen is soft.  Musculoskeletal:        General: Tenderness present.     Cervical back: Neck supple.     Right lower leg: No edema.     Left lower leg: No edema.     Comments: Patient has good distal strength with no pain over the greater trochanters.  No clonus or focal weakness.  Skin:    Findings: No erythema, lesion or rash.  Neurological:     General: No focal deficit present.     Mental Status: She is alert and oriented to person, place, and time.     Sensory: No sensory deficit.     Motor: No weakness or abnormal muscle tone.     Coordination: Coordination normal.  Psychiatric:  Mood and Affect: Mood normal.        Behavior: Behavior normal.      Imaging: No results found.

## 2022-06-19 ENCOUNTER — Telehealth: Payer: Self-pay | Admitting: Physical Medicine and Rehabilitation

## 2022-06-19 ENCOUNTER — Other Ambulatory Visit (HOSPITAL_COMMUNITY): Payer: Self-pay

## 2022-06-19 NOTE — Telephone Encounter (Signed)
Patient would like to reschedule appt on 12/04

## 2022-06-20 ENCOUNTER — Telehealth: Payer: Self-pay | Admitting: Physical Medicine and Rehabilitation

## 2022-06-20 NOTE — Telephone Encounter (Signed)
Sent message via MyChart.

## 2022-06-20 NOTE — Telephone Encounter (Signed)
Patient called. She needs to [facility] her 12/4 appointment with Dr. Ernestina Patches.

## 2022-06-23 ENCOUNTER — Ambulatory Visit: Payer: BC Managed Care – PPO | Admitting: Physical Medicine and Rehabilitation

## 2022-06-23 NOTE — Telephone Encounter (Signed)
Spoke with patient and she stated she is feeling better. Informed patient to call clinic if she starts to have back pain

## 2022-07-02 ENCOUNTER — Other Ambulatory Visit (HOSPITAL_COMMUNITY): Payer: Self-pay

## 2022-07-18 ENCOUNTER — Telehealth: Payer: Self-pay | Admitting: Orthopedic Surgery

## 2022-07-18 ENCOUNTER — Other Ambulatory Visit (HOSPITAL_COMMUNITY): Payer: Self-pay

## 2022-07-18 ENCOUNTER — Inpatient Hospital Stay: Payer: BC Managed Care – PPO | Attending: Hematology and Oncology

## 2022-07-18 DIAGNOSIS — C9210 Chronic myeloid leukemia, BCR/ABL-positive, not having achieved remission: Secondary | ICD-10-CM | POA: Insufficient documentation

## 2022-07-18 LAB — CMP (CANCER CENTER ONLY)
ALT: 33 U/L (ref 0–44)
AST: 31 U/L (ref 15–41)
Albumin: 4.3 g/dL (ref 3.5–5.0)
Alkaline Phosphatase: 79 U/L (ref 38–126)
Anion gap: 7 (ref 5–15)
BUN: 18 mg/dL (ref 6–20)
CO2: 28 mmol/L (ref 22–32)
Calcium: 9.4 mg/dL (ref 8.9–10.3)
Chloride: 105 mmol/L (ref 98–111)
Creatinine: 0.95 mg/dL (ref 0.44–1.00)
GFR, Estimated: >60 mL/min (ref >60)
Glucose, Bld: 107 mg/dL — ABNORMAL HIGH (ref 70–99)
Potassium: 3.8 mmol/L (ref 3.5–5.1)
Sodium: 140 mmol/L (ref 135–145)
Total Bilirubin: 0.4 mg/dL (ref 0.3–1.2)
Total Protein: 6.9 g/dL (ref 6.5–8.1)

## 2022-07-18 LAB — CBC WITH DIFFERENTIAL (CANCER CENTER ONLY)
Abs Immature Granulocytes: 0.01 10*3/uL (ref 0.00–0.07)
Basophils Absolute: 0.1 10*3/uL (ref 0.0–0.1)
Basophils Relative: 1 %
Eosinophils Absolute: 0.5 10*3/uL (ref 0.0–0.5)
Eosinophils Relative: 7 %
HCT: 39.5 % (ref 36.0–46.0)
Hemoglobin: 14.2 g/dL (ref 12.0–15.0)
Immature Granulocytes: 0 %
Lymphocytes Relative: 41 %
Lymphs Abs: 2.8 10*3/uL (ref 0.7–4.0)
MCH: 33.6 pg (ref 26.0–34.0)
MCHC: 35.9 g/dL (ref 30.0–36.0)
MCV: 93.6 fL (ref 80.0–100.0)
Monocytes Absolute: 0.4 10*3/uL (ref 0.1–1.0)
Monocytes Relative: 6 %
Neutro Abs: 3.2 10*3/uL (ref 1.7–7.7)
Neutrophils Relative %: 45 %
Platelet Count: 279 10*3/uL (ref 150–400)
RBC: 4.22 MIL/uL (ref 3.87–5.11)
RDW: 12.3 % (ref 11.5–15.5)
WBC Count: 7 10*3/uL (ref 4.0–10.5)
nRBC: 0 % (ref 0.0–0.2)

## 2022-07-18 NOTE — Telephone Encounter (Signed)
[name] would like to discuss scheduling a 2nd ESI for her back.  She believes her last one was before Thanksgiving and she feels like it's time for another one. She can be reached at [phone].

## 2022-07-22 ENCOUNTER — Telehealth: Payer: Self-pay | Admitting: Pharmacy Technician

## 2022-07-22 ENCOUNTER — Other Ambulatory Visit (HOSPITAL_COMMUNITY): Payer: Self-pay

## 2022-07-22 ENCOUNTER — Other Ambulatory Visit: Payer: Self-pay

## 2022-07-22 NOTE — Telephone Encounter (Signed)
Oral Oncology Patient Advocate Encounter  Received notification from [facility] that patient's copay for Sprycel is now $9,422.46.  Attempted to contact patient to assist n enrolling them in copay card to help cover some of this cost.  Patient must talk directly to [place] [phone] in order to enroll in copay assistance for Sprycel.  I will continue to follow until a resolution has been made.  Lady Deutscher, CPhT-Adv Oncology Pharmacy Patient [place] Direct Number: [phone]  Fax: [phone]

## 2022-07-22 NOTE — Telephone Encounter (Signed)
LVM #1 to return call to clinic to get more information

## 2022-07-22 NOTE — Telephone Encounter (Signed)
Oral Oncology Patient Advocate Encounter   Was successful in obtaining a copay card for Sprycel.  This copay card will make the patients copay $0.  I have spoken with the patient.    Max annual limit for copay card is $15,000  The billing information is as follows and has been shared with [place].   RxBin: [id number] PCN: [id number] Member ID: [id number] Group ID: [id number]   Lady Deutscher, CPhT-Adv Oncology Pharmacy Patient [place] Direct Number: [phone]  Fax: [phone]

## 2022-07-23 NOTE — Telephone Encounter (Signed)
LVM #2 asking patient to return call to clinic informing us if the injection on 06/09/22 helped and for how long

## 2022-07-25 LAB — BCR/ABL

## 2022-07-28 ENCOUNTER — Other Ambulatory Visit (HOSPITAL_COMMUNITY): Payer: Self-pay

## 2022-07-28 ENCOUNTER — Telehealth: Payer: Self-pay | Admitting: Physical Medicine and Rehabilitation

## 2022-07-28 NOTE — Telephone Encounter (Signed)
Patient returned call asked for a call back. Patient said she has been playing phone tag and unable to speak with anyone. The number to contact patient is [phone]

## 2022-07-29 ENCOUNTER — Encounter: Payer: Self-pay | Admitting: Hematology and Oncology

## 2022-07-29 ENCOUNTER — Inpatient Hospital Stay: Payer: BC Managed Care – PPO | Admitting: Hematology and Oncology

## 2022-07-29 ENCOUNTER — Telehealth (HOSPITAL_BASED_OUTPATIENT_CLINIC_OR_DEPARTMENT_OTHER): Payer: BC Managed Care – PPO | Admitting: Hematology and Oncology

## 2022-07-29 DIAGNOSIS — C9210 Chronic myeloid leukemia, BCR/ABL-positive, not having achieved remission: Secondary | ICD-10-CM

## 2022-07-29 DIAGNOSIS — M545 Low back pain, unspecified: Secondary | ICD-10-CM

## 2022-07-29 DIAGNOSIS — G8929 Other chronic pain: Secondary | ICD-10-CM | POA: Insufficient documentation

## 2022-07-29 NOTE — Progress Notes (Deleted)
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 Molecular studies show improvement although she is not in major molecular response We will continue treatment for another 3 months We discussed expected side effects from increased dose of Sprycel  Chronic back pain She had MRI done which show degenerative disc disease Recommend physical therapy and rehab  Orders Placed This Encounter  Procedures   CBC with Differential/Platelet    Standing Status:   Standing    Number of Occurrences:   22    Standing Expiration Date:   07/30/2023   Comprehensive metabolic panel    Standing Status:   Standing    Number of Occurrences:   33    Standing Expiration Date:   07/30/2023   BCR-ABL    With RT-PCR technique    Standing Status:   Standing    Number of Occurrences:   22    Standing Expiration Date:   07/30/2023    All questions were answered. The patient knows to call the clinic with any problems, questions or concerns. The total time spent in the appointment was 20 minutes encounter with patients including review of chart and various tests results, discussions about plan of care and coordination of care plan   [name], MD 07/29/2022 12:05 PM  INTERVAL HISTORY: Please see below for problem oriented charting. she returns for treatment follow-up  REVIEW OF SYSTEMS:   Constitutional: Denies fevers, chills or abnormal weight loss Eyes: Denies blurriness of vision Ears, nose, mouth, throat, and face: Denies mucositis or sore throat Respiratory: Denies cough, dyspnea or wheezes Cardiovascular: Denies palpitation, chest discomfort or lower extremity swelling Gastrointestinal:  Denies nausea, heartburn or change in bowel habits Skin: Denies abnormal skin rashes Lymphatics: Denies new lymphadenopathy or easy  bruising Neurological:Denies numbness, tingling or new weaknesses Behavioral/Psych: Mood is stable, no new changes  All other systems were reviewed with the patient and are negative.  I have reviewed the past medical history, past surgical history, social history and family history with the patient and they are unchanged from previous note.  ALLERGIES:  is allergic to other, sulfa antibiotics, wellbutrin [bupropion], and piperacillin sod-tazobactam so.  MEDICATIONS:  Current Outpatient Medications  Medication Sig Dispense Refill   acetaminophen (TYLENOL) 325 MG tablet Take 325 mg by mouth every 6 (six) hours as needed (pain).      calcium carbonate (TUMS - DOSED IN MG ELEMENTAL CALCIUM) 500 MG chewable tablet Chew 1 tablet by mouth 2 (two) times daily.     cholecalciferol (VITAMIN D3) 25 MCG (1000 UNIT) tablet Take 2,000 Units by mouth daily.     clonazePAM (KLONOPIN) 0.5 MG tablet Take 0.5 mg by mouth 2 (two) times daily as needed for anxiety.     dasatinib (SPRYCEL) 140 MG tablet Take 1 tablet (140 mg total) by mouth daily. 30 tablet 11   escitalopram (LEXAPRO) 20 MG tablet Take 30 mg by mouth daily.     ibuprofen (ADVIL) 200 MG tablet Take 200 mg by mouth every 6 (six) hours as needed for moderate pain.      ketoconazole (NIZORAL) 2 % cream Apply Externally twice a day 28 days 30 g 3   Lactobacillus (PROBIOTIC ACIDOPHILUS PO) Take 1 capsule by mouth daily.     loperamide (IMODIUM) 2 MG capsule Take 2 mg by mouth daily as needed for  diarrhea or loose stools.     LORazepam (ATIVAN) 1 MG tablet Take 1 tablet (1 mg total) by mouth 2 (two) times daily as needed for anxiety. 10 tablet 0   metaxalone (SKELAXIN) 400 MG tablet Take 1 tablet (400 mg total) by mouth 3 (three) times daily as needed. 20 tablet 0   methocarbamol (ROBAXIN) 500 MG tablet Take 1 tablet  by mouth 2  times daily as needed for muscle spasms. 20 tablet 0   Multiple Vitamin (MULTIVITAMIN WITH MINERALS) TABS tablet Take 1  tablet by mouth daily.     Na Sulfate-K Sulfate-Mg Sulf 17.5-3.13-1.6 GM/177ML SOLN One kit contains 2 bottles.  Take both bottles by mouth at the times instructed by your provider. 354 mL 0   ondansetron (ZOFRAN) 4 MG tablet Take 1 tablet (4 mg total) by mouth every 8 (eight) hours as needed for nausea or vomiting. 30 tablet 0   No current facility-administered medications for this visit.    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[place])   03/01/2021 Pathology Results   BCR/ABL by FISH: 91% positive BRC/ABL by PCR: e13a2 (b2a2) by IS is 196.685%   04/25/2021 -  Chemotherapy   She started taking imatinib   05/15/2021 Pathology Results   BRC/ABL is detected by PCR: e13a2 (b2a2) by IS is 70.731%   06/17/2021 Pathology Results   e13a2 (b2a2) by Quantidex (IS): 39.7401%   07/24/2021 Pathology Results   e13a2 (b2a2) by Quantidex (IS): 33.3%   08/05/2021 -  Chemotherapy   She started taking Sprycel    08/30/2021 Pathology Results   e13a2 (b2a2) by Quantidex (IS): 9.8004%   11/18/2021 Pathology Results   e13a2 (b2a2) by Quantidex (IS): 0.8987%   12/17/2021 Pathology Results   e13a2 (b2a2) by Quantidex (IS): 0.4764%   02/24/2022 Pathology Results   e13a2 (b2a2) by Quantidex (IS): 0.2635%   04/18/2022 Pathology Results   e13a2 (b2a2) by Quantidex (IS): 0.2739%    05/27/2022 Pathology Results   e13a2 (b2a2) by Quantidex (IS): 0.3033     07/16/2022 Pathology Results   e13a2 (b2a2) by Quantidex (IS): 0.1576%     PHYSICAL EXAMINATION: ECOG PERFORMANCE STATUS: 1 - Symptomatic but completely ambulatory  There were no vitals filed for this visit. There were no vitals filed for this visit.  GENERAL:alert, no distress and comfortable SKIN: skin color, texture, turgor are normal, no rashes or significant lesions EYES: normal, Conjunctiva are pink and non-injected, sclera  clear OROPHARYNX:no exudate, no erythema and lips, buccal mucosa, and tongue normal  NECK: supple, thyroid normal size, non-tender, without nodularity LYMPH:  no palpable lymphadenopathy in the cervical, axillary or inguinal LUNGS: clear to auscultation and percussion with normal breathing effort HEART: regular rate & rhythm and no murmurs and no lower extremity edema ABDOMEN:abdomen soft, non-tender and normal bowel sounds Musculoskeletal:no cyanosis of digits and no clubbing  NEURO: alert & oriented x 3 with fluent speech, no focal motor/sensory deficits  LABORATORY DATA:  I have reviewed the data as listed    Component Value Date/Time   NA 140 07/18/2022 1206   K 3.8 07/18/2022 1206   CL 105 07/18/2022 1206   CO2 28 07/18/2022 1206   GLUCOSE 107 (H) 07/18/2022 1206   BUN 18 07/18/2022 1206   CREATININE 0.95 07/18/2022 1206   CALCIUM 9.4 07/18/2022 1206   PROT 6.9 07/18/2022 1206   ALBUMIN 4.3 07/18/2022 1206   AST 31  07/18/2022 1206   ALT 33 07/18/2022 1206   ALKPHOS 79 07/18/2022 1206   BILITOT 0.4 07/18/2022 1206   GFRNONAA >60 07/18/2022 1206   GFRAA >60 10/21/2019 1020    No results found for: "SPEP", "UPEP"  Lab Results  Component Value Date   WBC 7.0 07/18/2022   NEUTROABS 3.2 07/18/2022   HGB 14.2 07/18/2022   HCT 39.5 07/18/2022   MCV 93.6 07/18/2022   PLT 279 07/18/2022      Chemistry      Component Value Date/Time   NA 140 07/18/2022 1206   K 3.8 07/18/2022 1206   CL 105 07/18/2022 1206   CO2 28 07/18/2022 1206   BUN 18 07/18/2022 1206   CREATININE 0.95 07/18/2022 1206      Component Value Date/Time   CALCIUM 9.4 07/18/2022 1206   ALKPHOS 79 07/18/2022 1206   AST 31 07/18/2022 1206   ALT 33 07/18/2022 1206   BILITOT 0.4 07/18/2022 1206       RADIOGRAPHIC STUDIES: I have personally reviewed the radiological images as listed and agreed with the findings in the report. No results found.

## 2022-07-29 NOTE — Telephone Encounter (Signed)
See previous encounter

## 2022-07-29 NOTE — Assessment & Plan Note (Signed)
She had MRI done which show degenerative disc disease Recommend physical therapy and rehab

## 2022-07-29 NOTE — Assessment & Plan Note (Signed)
She tolerated increased dose Sprycel well Molecular studies show improvement although she is not in major molecular response We will continue treatment for another 3 months We discussed expected side effects from increased dose of Sprycel

## 2022-07-29 NOTE — Telephone Encounter (Signed)
Left detailed voicemail requesting patient to return call to clinic to inform us if the injection on 06/09/22 helped and for how long

## 2022-07-29 NOTE — Progress Notes (Signed)
HEMATOLOGY-ONCOLOGY ELECTRONIC VISIT PROGRESS NOTE  Patient Care Team: Merrilee Seashore, MD as PCP - General (Internal Medicine)  I connected with the patient via telephone conference and verified that I am speaking with the correct person using two identifiers. The patient's location is at home and I am providing care from the [facility] I discussed the limitations, risks, security and privacy concerns of performing an evaluation and management service by e-visits and the availability of in person appointments.  I also discussed with the patient that there may be a patient responsible charge related to this service. The patient expressed understanding and agreed to proceed.   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 Molecular studies show improvement although she is not in major molecular response We will continue treatment for another 3 months We discussed expected side effects from increased dose of Sprycel  Chronic back pain She had MRI done which show degenerative disc disease Recommend physical therapy and rehab  Orders Placed This Encounter  Procedures   CBC with Differential/Platelet    Standing Status:   Standing    Number of Occurrences:   22    Standing Expiration Date:   07/30/2023   Comprehensive metabolic panel    Standing Status:   Standing    Number of Occurrences:   33    Standing Expiration Date:   07/30/2023   BCR-ABL    With RT-PCR technique    Standing Status:   Standing    Number of Occurrences:   22    Standing Expiration Date:   07/30/2023    INTERVAL HISTORY: Please see below for problem oriented charting. 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[place])   03/01/2021 Pathology Results   BCR/ABL by FISH: 91% positive BRC/ABL by PCR: e13a2 (b2a2) by IS is 196.685%   04/25/2021 -  Chemotherapy   She started taking imatinib   05/15/2021 Pathology Results   BRC/ABL is detected by PCR: e13a2 (b2a2) by IS is 70.731%   06/17/2021 Pathology Results   e13a2 (b2a2) by Quantidex (IS): 39.7401%   07/24/2021 Pathology Results   e13a2 (b2a2) by Quantidex (IS): 33.3%   08/05/2021 -  Chemotherapy   She started taking Sprycel    08/30/2021 Pathology Results   e13a2 (b2a2) by Quantidex (IS): 9.8004%   11/18/2021 Pathology Results   e13a2 (b2a2) by Quantidex (IS): 0.8987%   12/17/2021 Pathology Results   e13a2 (b2a2) by Quantidex (IS): 0.4764%   02/24/2022 Pathology Results   e13a2 (b2a2) by Quantidex (IS): 0.2635%   04/18/2022 Pathology Results   e13a2 (b2a2) by Quantidex (IS): 0.2739%    05/27/2022 Pathology Results   e13a2 (b2a2) by Quantidex (IS): 0.3033     07/16/2022 Pathology Results   e13a2 (b2a2) by Quantidex (IS): 0.1576%     REVIEW OF SYSTEMS:   Constitutional: Denies fevers, chills or abnormal weight loss Eyes: Denies blurriness of vision Ears, nose, mouth, throat, and face: Denies mucositis or sore throat Respiratory: Denies cough, dyspnea or wheezes Cardiovascular: Denies palpitation, chest discomfort Gastrointestinal:  Denies nausea, heartburn or change in bowel habits Skin: Denies abnormal skin  rashes Lymphatics: Denies new lymphadenopathy or easy bruising Neurological:Denies numbness, tingling or new weaknesses Behavioral/Psych: Mood is stable, no new changes  Extremities: No lower extremity edema All other systems were reviewed with the patient and are negative.  I have reviewed the past medical history, past surgical history, social history and family history with the patient  and they are unchanged from previous note.  ALLERGIES:  is allergic to other, sulfa antibiotics, wellbutrin [bupropion], and piperacillin sod-tazobactam so.  MEDICATIONS:  Current Outpatient Medications  Medication Sig Dispense Refill   acetaminophen (TYLENOL) 325 MG tablet Take 325 mg by mouth every 6 (six) hours as needed (pain).      calcium carbonate (TUMS - DOSED IN MG ELEMENTAL CALCIUM) 500 MG chewable tablet Chew 1 tablet by mouth 2 (two) times daily.     cholecalciferol (VITAMIN D3) 25 MCG (1000 UNIT) tablet Take 2,000 Units by mouth daily.     clonazePAM (KLONOPIN) 0.5 MG tablet Take 0.5 mg by mouth 2 (two) times daily as needed for anxiety.     dasatinib (SPRYCEL) 140 MG tablet Take 1 tablet (140 mg total) by mouth daily. 30 tablet 11   escitalopram (LEXAPRO) 20 MG tablet Take 30 mg by mouth daily.     ibuprofen (ADVIL) 200 MG tablet Take 200 mg by mouth every 6 (six) hours as needed for moderate pain.      ketoconazole (NIZORAL) 2 % cream Apply Externally twice a day 28 days 30 g 3   Lactobacillus (PROBIOTIC ACIDOPHILUS PO) Take 1 capsule by mouth daily.     loperamide (IMODIUM) 2 MG capsule Take 2 mg by mouth daily as needed for diarrhea or loose stools.     LORazepam (ATIVAN) 1 MG tablet Take 1 tablet (1 mg total) by mouth 2 (two) times daily as needed for anxiety. 10 tablet 0   metaxalone (SKELAXIN) 400 MG tablet Take 1 tablet (400 mg total) by mouth 3 (three) times daily as needed. 20 tablet 0   methocarbamol (ROBAXIN) 500 MG tablet Take 1 tablet  by mouth 2  times daily as needed for muscle spasms. 20 tablet 0   Multiple Vitamin (MULTIVITAMIN WITH MINERALS) TABS tablet Take 1 tablet by mouth daily.     Na Sulfate-K Sulfate-Mg Sulf 17.5-3.13-1.6 GM/177ML SOLN One kit contains 2 bottles.  Take both bottles by mouth at the times instructed by your provider. 354 mL 0   ondansetron (ZOFRAN) 4 MG tablet Take 1 tablet (4 mg total) by mouth every 8 (eight) hours as needed for nausea or  vomiting. 30 tablet 0   No current facility-administered medications for this visit.    PHYSICAL EXAMINATION: ECOG PERFORMANCE STATUS: 1 - Symptomatic but completely ambulatory  LABORATORY DATA:  I have reviewed the data as listed    Latest Ref Rng & Units 07/18/2022   12:06 PM 05/29/2022   12:26 PM 04/14/2022   11:52 AM  CMP  Glucose 70 - 99 mg/dL 107  111  114   BUN 6 - 20 mg/dL '18  13  13   '$ Creatinine 0.44 - 1.00 mg/dL 0.95  1.02  0.99   Sodium 135 - 145 mmol/L 140  138  139   Potassium 3.5 - 5.1 mmol/L 3.8  3.8  3.9   Chloride 98 - 111 mmol/L 105  105  106   CO2 22 - 32 mmol/L '28  26  29   '$ Calcium 8.9 - 10.3 mg/dL 9.4  9.0  8.8   Total Protein 6.5 -  8.1 g/dL 6.9  6.7  6.5   Total Bilirubin 0.3 - 1.2 mg/dL 0.4  0.4  0.4   Alkaline Phos 38 - 126 U/L 79  84  78   AST 15 - 41 U/L '31  22  21   '$ ALT 0 - 44 U/L 33  19  15     Lab Results  Component Value Date   WBC 7.0 07/18/2022   HGB 14.2 07/18/2022   HCT 39.5 07/18/2022   MCV 93.6 07/18/2022   PLT 279 07/18/2022   NEUTROABS 3.2 07/18/2022     I discussed the assessment and treatment plan with the patient. The patient was provided an opportunity to ask questions and all were answered. The patient agreed with the plan and demonstrated an understanding of the instructions. The patient was advised to call back or seek an in-person evaluation if the symptoms worsen or if the condition fails to improve as anticipated.    I spent 20 minutes for the appointment reviewing test results, discuss management and coordination of care.  [name], MD 07/29/2022 12:06 PM

## 2022-07-31 ENCOUNTER — Other Ambulatory Visit: Payer: Self-pay | Admitting: Physical Medicine and Rehabilitation

## 2022-07-31 DIAGNOSIS — M5416 Radiculopathy, lumbar region: Secondary | ICD-10-CM

## 2022-07-31 NOTE — Progress Notes (Signed)
Received VM from [employer] asking for medical records on Pt in order to complete PA. Attempted call back and LVM including Medical Records phone number to call and Alvy Bimler nurse call back number for questions.  [facility] phone number is [phone] ext [phone] and fax number is [phone].

## 2022-07-31 NOTE — Telephone Encounter (Signed)
Patient left message explaining the shot worked great and lasted 2.5-3 weeks. No new injuries or falls

## 2022-08-01 ENCOUNTER — Encounter: Payer: Self-pay | Admitting: Physical Medicine and Rehabilitation

## 2022-08-01 ENCOUNTER — Telehealth: Payer: Self-pay

## 2022-08-01 NOTE — Telephone Encounter (Signed)
LVM for patient apologizing for not returning her call and that the injection order was put in and we are waiting for approval from insurance

## 2022-08-04 ENCOUNTER — Other Ambulatory Visit: Payer: Self-pay

## 2022-08-07 ENCOUNTER — Other Ambulatory Visit (HOSPITAL_COMMUNITY): Payer: Self-pay

## 2022-08-11 ENCOUNTER — Other Ambulatory Visit (HOSPITAL_COMMUNITY): Payer: Self-pay

## 2022-08-13 ENCOUNTER — Other Ambulatory Visit (HOSPITAL_COMMUNITY): Payer: Self-pay

## 2022-08-13 LAB — BCR-ABL1 KINASE DOMAIN MUTATION ANALYSIS

## 2022-08-14 ENCOUNTER — Encounter: Payer: Self-pay | Admitting: Physical Medicine and Rehabilitation

## 2022-08-14 ENCOUNTER — Other Ambulatory Visit (HOSPITAL_COMMUNITY): Payer: Self-pay

## 2022-08-14 ENCOUNTER — Ambulatory Visit: Payer: BC Managed Care – PPO | Admitting: Physical Medicine and Rehabilitation

## 2022-08-14 DIAGNOSIS — M5416 Radiculopathy, lumbar region: Secondary | ICD-10-CM

## 2022-08-14 DIAGNOSIS — M4726 Other spondylosis with radiculopathy, lumbar region: Secondary | ICD-10-CM | POA: Diagnosis not present

## 2022-08-14 DIAGNOSIS — M47816 Spondylosis without myelopathy or radiculopathy, lumbar region: Secondary | ICD-10-CM | POA: Diagnosis not present

## 2022-08-14 NOTE — Progress Notes (Signed)
Functional Pain Scale - descriptive words and definitions  Mild (2)   Noticeable when not distracted/no impact on ADL's/sleep only slightly affected and able to   use both passive and active distraction for comfort. Mild range order  Average Pain  varies  Lower back pain that radiates to the right hip. Tightness and pain in right hip

## 2022-08-14 NOTE — Progress Notes (Signed)
[name] - 57 y.o. female MRN [id number]  Date of birth: 03-16-1966  Office Visit Note: Visit Date: 08/14/2022 PCP: Merrilee Seashore, MD Referred by: Merrilee Seashore, MD  Subjective: Chief Complaint  Patient presents with   Lower Back - Pain   HPI: [name] is a 57 y.o. female who comes in today for evaluation of chronic left sided lower back pain radiating to left groin and down anterior leg to foot. Pain ongoing for several months and worsens with movement and activity. States her pain increased several weeks ago while helping to load horse on trailer, pain has since subsided significantly. She describes pain as sore and aching, currently rates as 1 out of 10. Some relief of pain with home exercise regimen, rest and use of medications. Currently taking Ibuprofen and Skelaxin as needed. Lumbar MRI imaging from November of 2023 exhibits multi level degenerative facet changes, moderate left lateral recess and foraminal stenosis at L4-L5. No high grade spinal canal stenosis noted. Left L4-L5 interlaminar epidural steroid injection performed on 06/09/2022 in our office provided greater than 50% relief of pain for 2.5 weeks. She also reports increased functional ability post injection. Overall, she is feeling much better and managing her lower back pain at home. She has plans to start back horse riding and is also inquiring about training program at [facility]. Patient denies focal weakness, numbness and tingling. No recent trauma or falls.   Review of Systems  Musculoskeletal:  Positive for back pain.  Neurological:  Negative for tingling, sensory change, focal weakness and weakness.  All other systems reviewed and are negative.  Otherwise per HPI.  Assessment & Plan: Visit Diagnoses:    ICD-10-CM   1. Lumbar radiculopathy  M54.16     2. Other spondylosis with radiculopathy, lumbar region  M47.26     3. Facet hypertrophy of lumbar region  M47.816        Plan:  Findings:  Chronic left sided lower back pain radiating to left groin and down anterior leg to foot. Minimal pain at this time, she continues with conservative therapies. Her clinical presentation and exam are consistent with L4 nerve pattern. Lumbar MRI does exhibit moderate lateral recess and foraminal narrowing at the level of L4-L5. Previous lumbar epidural steroid injection provided good relief of pain for several weeks. Next step is to continue to monitor, at this time we do not recommend repeat lumbar epidural steroid injection as her pain has subsided. I instructed patient to let us know if her pain returns or changes in nature. We can always look at repeating injection infrequently as needed. I encouraged patient to remain active, I do think consistent exercise regimen would be beneficial for her. We are happy to refill Skelaxin for her as needed. No red flag symptoms noted upon exam today.     Meds & Orders: No orders of the defined types were placed in this encounter.  No orders of the defined types were placed in this encounter.   Follow-up: Return if symptoms worsen or fail to improve.   Procedures: No procedures performed      Clinical History: Narrative & Impression CLINICAL DATA:  Lower back pain. Acute left-sided low back pain with left-sided sciatica.   EXAM: MRI LUMBAR SPINE WITHOUT CONTRAST   TECHNIQUE: Multiplanar, multisequence MR imaging of the lumbar spine was performed. No intravenous contrast was administered.   COMPARISON:  Radiographs Dec 17, 2021.   FINDINGS: Segmentation:  Standard.   Alignment: Levoconvex scoliosis. Small  retrolisthesis at L1-2 and L2-3.   Vertebrae: No fracture, evidence of discitis, or bone lesion. Endplate degenerative changes at L2-3 and L4-5.   Conus medullaris and cauda equina: Conus extends to the L1 level. Conus and cauda equina appear normal.   Paraspinal and other soft tissues: Negative.   Disc levels:   T11-12:  Small left central disc protrusion causing small indentation of the thecal sac without significant spinal canal or neural foraminal stenosis.   T12-L1: No spinal canal or neural foraminal stenosis.   L1-2: Loss of disc height, disc bulge and mild facet degenerative changes resulting in mild bilateral neural foraminal narrowing. No spinal canal stenosis.   L2-3: Loss of disc height, disc bulge and mild facet degenerative changes resulting in mild bilateral neural foraminal narrowing. No significant spinal canal stenosis.   L3-4: Loss of disc height, right asymmetric disc bulge and mild facet degenerative change resulting in mild spinal canal stenosis with moderate right and mild left subarticular zone stenosis and mild right neural foraminal.   L4-5: Loss of disc height, disc bulge, mild right and moderate hypertrophic left facet degenerative changes resulting in mild right and moderate left subarticular zone stenosis, mild right and moderate left neural foraminal narrowing.   L5-S1: Shallow disc bulge and moderate facet degenerative changes without significant spinal canal or neural foraminal stenosis.   IMPRESSION: 1. 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 2. Moderate right subarticular zone stenosis and mild right neural foraminal narrowing at L3-4. 3. Mild bilateral neural foraminal narrowing at L1-2 and L2-3.     Electronically Signed   By: Pedro Earls M.D.   On: 05/25/2022 13:23   She reports that she has been smoking cigarettes. She has a 17.00 pack-year smoking history. She has never used smokeless tobacco. No results for input(s): "HGBA1C", "LABURIC" in the last 8760 hours.  Objective:  VS:  HT:    WT:   BMI:     BP:   HR: bpm  TEMP: ( )  RESP:  Physical Exam Vitals and nursing note reviewed.  HENT:     Head: Normocephalic and atraumatic.     Right Ear: External ear normal.     Left  Ear: External ear normal.     Nose: Nose normal.     Mouth/Throat:     Mouth: Mucous membranes are moist.  Eyes:     Extraocular Movements: Extraocular movements intact.  Cardiovascular:     Rate and Rhythm: Normal rate.     Pulses: Normal pulses.  Pulmonary:     Effort: Pulmonary effort is normal.  Abdominal:     General: Abdomen is flat. There is no distension.  Musculoskeletal:        General: Tenderness present.     Cervical back: Normal range of motion.     Comments: Pt rises from seated position to standing without difficulty. Good lumbar range of motion. Strong distal strength without clonus, no pain upon palpation of greater trochanters. Sensation intact bilaterally. Dysesthesias noted to left L4 dermatome. Walks independently, gait steady.   Skin:    General: Skin is warm and dry.     Capillary Refill: Capillary refill takes less than 2 seconds.  Neurological:     General: No focal deficit present.     Mental Status: She is alert and oriented to person, place, and time.  Psychiatric:        Mood and Affect: Mood normal.  Behavior: Behavior normal.     Ortho Exam  Imaging: No results found.  Past Medical/Family/Surgical/Social History: Medications & Allergies reviewed per EMR, new medications updated. 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[place]) 03/01/2021   Family history of breast cancer 03/01/2021   Continuous dependence on cigarette smoking 03/01/2021   Past Medical History:  Diagnosis Date   Anxiety    Family history of pancreatic cancer [birth year]/34[birth year]1937   Monoallelic  mutation of CHEK2 gene in female patient 05/21/2021   Family History  Problem Relation Age of Onset   Breast cancer Mother        bilateral; dx 77, 62   Acute myelogenous leukemia Mother        dx late 46s   Pancreatic cancer Father 5   Breast cancer Maternal Aunt        dx after 50   Breast cancer Maternal Aunt        dx after 50   Bladder Cancer Paternal Uncle        dx after 41   Colon cancer Maternal Grandmother 81   Past Surgical History:  Procedure Laterality Date   ABDOMINAL HYSTERECTOMY     APPENDECTOMY     bowel removal     5 inches   BOWEL RESECTION     2 feet   BREAST BIOPSY Right 04/29/2019   APOCRINE METAPLASIA    BREAST LUMPECTOMY WITH RADIOACTIVE SEED LOCALIZATION Left 10/03/2021   Procedure: LEFT BREAST LUMPECTOMY WITH RADIOACTIVE SEED LOCALIZATION;  Surgeon: Jovita Kussmaul, MD;  Location: [place];  Service: General;  Laterality: Left;   Social History   Occupational History   Occupation: retired  Tobacco Use   Smoking status: Every Day    Packs/day: 1.00    Years: 17.00    Total pack years: 17.00    Types: Cigarettes   Smokeless tobacco: Never  Substance and Sexual Activity   Alcohol use: No   Drug use: No   Sexual activity: Yes

## 2022-08-15 ENCOUNTER — Other Ambulatory Visit (HOSPITAL_COMMUNITY): Payer: Self-pay

## 2022-08-15 ENCOUNTER — Other Ambulatory Visit: Payer: Self-pay

## 2022-08-18 ENCOUNTER — Other Ambulatory Visit (HOSPITAL_COMMUNITY): Payer: Self-pay

## 2022-08-19 ENCOUNTER — Other Ambulatory Visit: Payer: Self-pay | Admitting: Women's Health

## 2022-08-19 DIAGNOSIS — Z1502 Genetic susceptibility to malignant neoplasm of ovary: Secondary | ICD-10-CM

## 2022-08-20 ENCOUNTER — Other Ambulatory Visit (HOSPITAL_COMMUNITY): Payer: Self-pay

## 2022-08-22 ENCOUNTER — Other Ambulatory Visit (HOSPITAL_COMMUNITY): Payer: Self-pay

## 2022-08-25 ENCOUNTER — Other Ambulatory Visit (HOSPITAL_COMMUNITY): Payer: Self-pay

## 2022-08-26 ENCOUNTER — Other Ambulatory Visit: Payer: Self-pay

## 2022-08-26 ENCOUNTER — Other Ambulatory Visit (HOSPITAL_COMMUNITY): Payer: Self-pay

## 2022-08-27 ENCOUNTER — Other Ambulatory Visit (HOSPITAL_COMMUNITY): Payer: Self-pay

## 2022-09-01 ENCOUNTER — Other Ambulatory Visit: Payer: BC Managed Care – PPO

## 2022-09-01 ENCOUNTER — Other Ambulatory Visit: Payer: Self-pay

## 2022-09-09 ENCOUNTER — Other Ambulatory Visit (HOSPITAL_COMMUNITY): Payer: Self-pay

## 2022-09-11 ENCOUNTER — Other Ambulatory Visit (HOSPITAL_COMMUNITY): Payer: Self-pay

## 2022-09-12 ENCOUNTER — Other Ambulatory Visit (HOSPITAL_COMMUNITY): Payer: Self-pay

## 2022-09-15 ENCOUNTER — Other Ambulatory Visit (HOSPITAL_COMMUNITY): Payer: Self-pay

## 2022-09-15 MED ORDER — PSEUDOEPH-BROMPHEN-DM 30-2-10 MG/5ML PO SYRP
5.0000 mL | ORAL_SOLUTION | Freq: Three times a day (TID) | ORAL | 0 refills | Status: DC | PRN
  Filled 2022-09-15: qty 200, 14d supply, fill #0

## 2022-09-16 ENCOUNTER — Other Ambulatory Visit (HOSPITAL_COMMUNITY): Payer: Self-pay

## 2022-09-20 ENCOUNTER — Other Ambulatory Visit (HOSPITAL_COMMUNITY): Payer: Self-pay

## 2022-09-26 ENCOUNTER — Other Ambulatory Visit (HOSPITAL_COMMUNITY): Payer: Self-pay

## 2022-09-29 ENCOUNTER — Other Ambulatory Visit: Payer: Self-pay

## 2022-10-07 ENCOUNTER — Encounter: Payer: Self-pay | Admitting: Physical Therapy

## 2022-10-07 ENCOUNTER — Telehealth: Payer: Self-pay

## 2022-10-07 NOTE — Telephone Encounter (Signed)
Spoke with patient and she is stating she has been in pain consistently for 10 days. Pain is the same, but on the right side now. She states her injection was denied.

## 2022-10-08 ENCOUNTER — Other Ambulatory Visit: Payer: Self-pay | Admitting: Physical Medicine and Rehabilitation

## 2022-10-08 DIAGNOSIS — M5416 Radiculopathy, lumbar region: Secondary | ICD-10-CM

## 2022-10-10 NOTE — Telephone Encounter (Signed)
LVM to return call to schedule OV 

## 2022-10-15 ENCOUNTER — Other Ambulatory Visit (HOSPITAL_COMMUNITY): Payer: Self-pay

## 2022-10-20 ENCOUNTER — Inpatient Hospital Stay: Payer: BC Managed Care – PPO | Attending: Hematology and Oncology

## 2022-10-20 ENCOUNTER — Encounter: Payer: Self-pay | Admitting: Physical Therapy

## 2022-10-20 ENCOUNTER — Ambulatory Visit (INDEPENDENT_AMBULATORY_CARE_PROVIDER_SITE_OTHER): Payer: BC Managed Care – PPO | Admitting: Physical Therapy

## 2022-10-20 ENCOUNTER — Other Ambulatory Visit: Payer: Self-pay

## 2022-10-20 DIAGNOSIS — F17210 Nicotine dependence, cigarettes, uncomplicated: Secondary | ICD-10-CM | POA: Insufficient documentation

## 2022-10-20 DIAGNOSIS — C9210 Chronic myeloid leukemia, BCR/ABL-positive, not having achieved remission: Secondary | ICD-10-CM | POA: Insufficient documentation

## 2022-10-20 DIAGNOSIS — M5459 Other low back pain: Secondary | ICD-10-CM

## 2022-10-20 DIAGNOSIS — G8929 Other chronic pain: Secondary | ICD-10-CM

## 2022-10-20 DIAGNOSIS — Z1509 Genetic susceptibility to other malignant neoplasm: Secondary | ICD-10-CM | POA: Insufficient documentation

## 2022-10-20 DIAGNOSIS — R293 Abnormal posture: Secondary | ICD-10-CM

## 2022-10-20 DIAGNOSIS — M6281 Muscle weakness (generalized): Secondary | ICD-10-CM

## 2022-10-20 LAB — CBC WITH DIFFERENTIAL/PLATELET
Abs Immature Granulocytes: 0.02 10*3/uL (ref 0.00–0.07)
Basophils Absolute: 0.1 10*3/uL (ref 0.0–0.1)
Basophils Relative: 1 %
Eosinophils Absolute: 0.6 10*3/uL — ABNORMAL HIGH (ref 0.0–0.5)
Eosinophils Relative: 8 %
HCT: 39.2 % (ref 36.0–46.0)
Hemoglobin: 13.7 g/dL (ref 12.0–15.0)
Immature Granulocytes: 0 %
Lymphocytes Relative: 36 %
Lymphs Abs: 2.5 10*3/uL (ref 0.7–4.0)
MCH: 33.3 pg (ref 26.0–34.0)
MCHC: 34.9 g/dL (ref 30.0–36.0)
MCV: 95.4 fL (ref 80.0–100.0)
Monocytes Absolute: 0.4 10*3/uL (ref 0.1–1.0)
Monocytes Relative: 6 %
Neutro Abs: 3.5 10*3/uL (ref 1.7–7.7)
Neutrophils Relative %: 49 %
Platelets: 288 10*3/uL (ref 150–400)
RBC: 4.11 MIL/uL (ref 3.87–5.11)
RDW: 12.5 % (ref 11.5–15.5)
WBC: 7.1 10*3/uL (ref 4.0–10.5)
nRBC: 0 % (ref 0.0–0.2)

## 2022-10-20 LAB — COMPREHENSIVE METABOLIC PANEL
ALT: 16 U/L (ref 0–44)
AST: 22 U/L (ref 15–41)
Albumin: 4.3 g/dL (ref 3.5–5.0)
Alkaline Phosphatase: 96 U/L (ref 38–126)
Anion gap: 7 (ref 5–15)
BUN: 15 mg/dL (ref 6–20)
CO2: 26 mmol/L (ref 22–32)
Calcium: 9 mg/dL (ref 8.9–10.3)
Chloride: 110 mmol/L (ref 98–111)
Creatinine, Ser: 0.96 mg/dL (ref 0.44–1.00)
GFR, Estimated: >60 mL/min (ref >60)
Glucose, Bld: 103 mg/dL — ABNORMAL HIGH (ref 70–99)
Potassium: 3.6 mmol/L (ref 3.5–5.1)
Sodium: 143 mmol/L (ref 135–145)
Total Bilirubin: 0.3 mg/dL (ref 0.3–1.2)
Total Protein: 6.6 g/dL (ref 6.5–8.1)

## 2022-10-20 NOTE — Therapy (Signed)
OUTPATIENT PHYSICAL THERAPY THORACOLUMBAR EVALUATION   Patient Name: [name] MRN: [id number] DOB:18-Jul-1966, 57 y.o., female Today's Date: 10/20/2022   PT End of Session - 10/20/22 1346     Visit Number 1    Date for PT Re-Evaluation 11/17/22    PT Start Time 1345    PT Stop Time 1423    PT Time Calculation (min) 38 min    Activity Tolerance Patient tolerated treatment well    Behavior During Therapy [name] for tasks assessed/performed              Past Medical History:  Diagnosis Date   Anxiety    Family history of pancreatic cancer [id number]   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   Past Surgical History:  Procedure Laterality Date   ABDOMINAL HYSTERECTOMY     APPENDECTOMY     bowel removal     5 inches   BOWEL RESECTION     2 feet   BREAST BIOPSY Right 04/29/2019   APOCRINE METAPLASIA    BREAST LUMPECTOMY WITH RADIOACTIVE SEED LOCALIZATION Left 10/03/2021   Procedure: LEFT BREAST LUMPECTOMY WITH RADIOACTIVE SEED LOCALIZATION;  Surgeon: Jovita Kussmaul, MD;  Location: [facility];  Service: General;  Laterality: Left;   Patient Active Problem List   Diagnosis Date Noted   Chronic back pain 07/29/2022   Depression 11/18/2021   Osteoporosis 11/18/2021   Abnormal magnetic resonance imaging of left breast 08/06/2021   Diarrhea 05/24/2021   Genetic testing [id number]   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   Preventive measure 04/23/2021   Family history of pancreatic cancer 03/20/2021   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   Family history of breast cancer 03/01/2021   Continuous dependence on cigarette smoking 03/01/2021    PCP: Merrilee Seashore, MD  REFERRING PROVIDER: Lorine Bears, NP  REFERRING DIAG: M54.16 (ICD-10-CM) - Lumbar radiculopathy  Rationale for Evaluation and Treatment Rehabilitation  THERAPY DIAG:  Other low back pain - Plan: PT plan of care  cert/re-cert  Abnormal posture - Plan: PT plan of care cert/re-cert  Muscle weakness (generalized) - Plan: PT plan of care cert/re-cert  ONSET DATE: Chronic x 10 months  SUBJECTIVE:                                                                                                                                                                                           SUBJECTIVE STATEMENT: Pt had injection which helped mildly, but continues to have pain now worse on Rt, but was on Lt last year  PERTINENT HISTORY:  CML, depression, osteoporosis  PAIN:  Are you having pain? Yes: NPRS scale: 4-5 currently, at best 1, up to 9.5/10 Pain location: Rt low back/hip, down to shin Pain description: constant pain Aggravating factors: transition from sit to stand Relieving factors: ibuprofen, muscle relaxers  PRECAUTIONS: None  WEIGHT BEARING RESTRICTIONS No  FALLS:  Has patient fallen in last 6 months? 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 MRI: 1. 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 2. Moderate right subarticular zone stenosis and mild right neural foraminal narrowing at L3-4. 3. Mild bilateral neural foraminal narrowing at L1-2 and L2-3.  SCREENING FOR RED FLAGS: Bowel or bladder incontinence: No Spinal tumors: No  COGNITION: Overall cognitive status: Within functional limits for tasks assessed     SENSATION: WFL  PALPATION: 10/20/22: reproduction of pain at  Rt SIJ  LUMBAR ROM:   Active  A/PROM  eval  Flexion WNL with end range pain  Extension WNL  Right lateral flexion WNL  Left lateral flexion WNL  Right rotation WNL  Left rotation WNL   (Blank rows = not tested)  LOWER EXTREMITY MMT:    MMT Right eval Left eval  Hip flexion 4/5 4/5  Hip extension 3/5 3/5   Hip abduction 3/5 3/5  Hip adduction    Knee extension 5/5 5/5  Knee flexion 4/5 4/5   (Blank rows = not tested)  LUMBAR SPECIAL TESTS:  Slump test: Negative  GAIT: 10/20/22: Comments: no significant deviations noted    TODAY'S TREATMENT  10/20/22 See HEP- reviewed and discussed exercises; also provided Reader referral for continued fitness and machines to try.  Pt advised on precautions for exercises.      PATIENT EDUCATION:  Education details: HEP Person educated: Patient Education method: Consulting civil engineer, Media planner, and Handouts Education comprehension: verbalized understanding and returned demonstration   HOME EXERCISE PROGRAM: Access Code: [email or web address] URL: [email or web address] Date: 10/20/2022 Prepared by: Faustino Congress  Exercises - Supine Piriformis Stretch with Foot on Ground  - 1-2 x daily - 7 x weekly - 1 sets - 3 reps - 30 sec hold - Supine 90/90 Single Leg Isometric Hip Extension  - 1-2 x daily - 7 x weekly - 1 sets - 10 reps - 5 sec hold - Supine Bridge with Resistance Band  - 1-2 x daily - 7 x weekly - 1 sets - 10 reps - 5 seconds hold - Prone Hip Extension  - 1-2 x daily - 7 x weekly - 1 sets - 10 reps - 3-5 sec hold - Prone Alternating Arm and Leg Lifts  - 1-2 x daily - 7 x weekly - 3 sets - 10 reps - 3-5 sec hold - Half Deadlift with Kettlebell  - 1 x daily - 7 x weekly - 3 sets - 10 reps - Lateral Lunge  - 1 x daily - 7 x weekly - 3 sets - 10 reps - Mini Squat with Counter Support  - 1 x daily - 7 x weekly - 3 sets - 10 reps - Supine Dead Bug with Leg Extension  -  1 x daily - 7 x weekly - 3 sets - 10  reps  ASSESSMENT:  CLINICAL IMPRESSION: Patient is a 57 y.o. female who was seen today for physical therapy evaluation and treatment for Lt sided LBP with radiculopathy.  She demonstrates some postural abnormalities, continued pain as well as decreased strength affecting functional mobility.  Pt will benefit from PT to address deficits listed.  Anticipate she will transition to community fitness but will see up to 1x/wk if needed.  OBJECTIVE IMPAIRMENTS decreased strength, increased muscle spasms, postural dysfunction, and pain.   ACTIVITY LIMITATIONS carrying, lifting, bending, standing, squatting, and locomotion level  PARTICIPATION LIMITATIONS: meal prep, cleaning, laundry, community activity, occupation, and yard work  PERSONAL FACTORS 3+ comorbidities: depression, osteoporosis, CML, smoker  are also affecting patient's functional outcome.   REHAB POTENTIAL: Good  CLINICAL DECISION MAKING: Stable/uncomplicated  EVALUATION COMPLEXITY: Low   GOALS: Goals reviewed with patient? Yes  LONG TERM GOALS: Target date: 11/17/2022  Independent with final HEP Goal status: INITIAL  2.  Lt hip strength improved to 4/5 for improved function Goal status: INIITAL  3.  Report pain < 3/10 with yardwork and work related tasks for improved function Goal status: INITIAL  PLAN: PT FREQUENCY:  Likely transition to community fitness, but will see up to 1x/wk PRN  PT DURATION: 4 weeks  PLANNED INTERVENTIONS: Therapeutic exercises, Therapeutic activity, Neuromuscular re-education, Gait training, Patient/Family education, Joint mobilization, Dry Needling, Spinal manipulation, Spinal mobilization, Cryotherapy, Moist heat, Taping, Manual therapy, and Re-evaluation.  PLAN FOR NEXT SESSION: if pt returns, review HEP and see how pt is doing, reassess PRN     Laureen Abrahams, PT, DPT 10/20/22 2:28 PM

## 2022-10-24 LAB — BCR/ABL

## 2022-10-29 ENCOUNTER — Other Ambulatory Visit (HOSPITAL_COMMUNITY): Payer: Self-pay

## 2022-10-30 ENCOUNTER — Telehealth: Payer: Self-pay | Admitting: Internal Medicine

## 2022-10-30 ENCOUNTER — Encounter: Payer: Self-pay | Admitting: Hematology and Oncology

## 2022-10-30 ENCOUNTER — Inpatient Hospital Stay (HOSPITAL_BASED_OUTPATIENT_CLINIC_OR_DEPARTMENT_OTHER): Payer: BC Managed Care – PPO | Admitting: Hematology and Oncology

## 2022-10-30 VITALS — BP 114/62 | HR 78 | Temp 99.6°F | Resp 18 | Ht 64.0 in | Wt 148.4 lb

## 2022-10-30 DIAGNOSIS — F17210 Nicotine dependence, cigarettes, uncomplicated: Secondary | ICD-10-CM | POA: Diagnosis not present

## 2022-10-30 DIAGNOSIS — C9210 Chronic myeloid leukemia, BCR/ABL-positive, not having achieved remission: Secondary | ICD-10-CM

## 2022-10-30 DIAGNOSIS — Z1589 Genetic susceptibility to other disease: Secondary | ICD-10-CM

## 2022-10-30 DIAGNOSIS — Z1501 Genetic susceptibility to malignant neoplasm of breast: Secondary | ICD-10-CM | POA: Diagnosis not present

## 2022-10-30 DIAGNOSIS — Z1502 Genetic susceptibility to malignant neoplasm of ovary: Secondary | ICD-10-CM

## 2022-10-30 DIAGNOSIS — Z1509 Genetic susceptibility to other malignant neoplasm: Secondary | ICD-10-CM

## 2022-10-30 NOTE — Telephone Encounter (Signed)
Okay to be scheduled for direct colonoscopy. If the patient prefers to be seen in clinic first, would be okay to schedule for OV. Patient has history of CHEK2 mutation. Recommendations are for colonoscopies starting at age 57 and a repeat colonoscopy every 5 years. She has a history of ileocecectomy due to prior SBO.

## 2022-10-30 NOTE — Progress Notes (Signed)
[facility] OFFICE PROGRESS NOTE  Patient Care Team: Georgianne Fickamachandran, Ajith, MD as PCP - General (Internal Medicine)  ASSESSMENT & PLAN:  CML (chronic myeloid leukemia) (HCC) She tolerated increased dose Sprycel well Molecular studies show improvement although she is not in major molecular response We will continue treatment for another 3 months I shared with her recent publication related to long term treatment with Sprycel  Continuous dependence on cigarette smoking We discussed the importance of nicotine cessation She will try her best to quit smoking  Monoallelic mutation of CHEK2 gene in female patient She is contemplating bilateral mastectomy; for now she will continue aggressive screening modality  No orders of the defined types were placed in this encounter.   All questions were answered. The patient knows to call the clinic with any problems, questions or concerns. The total time spent in the appointment was 20 minutes encounter with patients including review of chart and various tests results, discussions about plan of care and coordination of care plan   Artis DelayNi Athalee Esterline, MD 10/30/2022 4:12 PM  INTERVAL HISTORY: Please see below for problem oriented charting. she returns for treatment follow-up on Sprycel She tolerated treatment well except for joint aches She has lots of stress socially; she finds it very hard to quit smoking  REVIEW OF SYSTEMS:   Constitutional: Denies fevers, chills or abnormal weight loss Eyes: Denies blurriness of vision Ears, nose, mouth, throat, and face: Denies mucositis or sore throat Respiratory: Denies cough, dyspnea or wheezes Cardiovascular: Denies palpitation, chest discomfort or lower extremity swelling Gastrointestinal:  Denies nausea, heartburn or change in bowel habits Skin: Denies abnormal skin rashes Lymphatics: Denies new lymphadenopathy or easy bruising Neurological:Denies numbness, tingling or new  weaknesses Behavioral/Psych: Mood is stable, no new changes  All other systems were reviewed with the patient and are negative.  I have reviewed the past medical history, past surgical history, social history and family history with the patient and they are unchanged from previous note.  ALLERGIES:  is allergic to other, sulfa antibiotics, wellbutrin [bupropion], and piperacillin sod-tazobactam so.  MEDICATIONS:  Current Outpatient Medications  Medication Sig Dispense Refill   acetaminophen (TYLENOL) 325 MG tablet Take 325 mg by mouth every 6 (six) hours as needed (pain).      calcium carbonate (TUMS - DOSED IN MG ELEMENTAL CALCIUM) 500 MG chewable tablet Chew 1 tablet by mouth 2 (two) times daily.     cholecalciferol (VITAMIN D3) 25 MCG (1000 UNIT) tablet Take 2,000 Units by mouth daily.     clonazePAM (KLONOPIN) 0.5 MG tablet Take 0.5 mg by mouth 2 (two) times daily as needed for anxiety.     dasatinib (SPRYCEL) 140 MG tablet Take 1 tablet (140 mg total) by mouth daily. 30 tablet 11   escitalopram (LEXAPRO) 20 MG tablet Take 30 mg by mouth daily.     ibuprofen (ADVIL) 200 MG tablet Take 200 mg by mouth every 6 (six) hours as needed for moderate pain.      ketoconazole (NIZORAL) 2 % cream Apply Externally twice a day 28 days 30 g 3   Lactobacillus (PROBIOTIC ACIDOPHILUS PO) Take 1 capsule by mouth daily.     loperamide (IMODIUM) 2 MG capsule Take 2 mg by mouth daily as needed for diarrhea or loose stools.     LORazepam (ATIVAN) 1 MG tablet Take 1 tablet (1 mg total) by mouth 2 (two) times daily as needed for anxiety. 10 tablet 0   metaxalone (SKELAXIN) 400 MG tablet Take 1  tablet (400 mg total) by mouth 3 (three) times daily as needed. 20 tablet 0   Multiple Vitamin (MULTIVITAMIN WITH MINERALS) TABS tablet Take 1 tablet by mouth daily.     Na Sulfate-K Sulfate-Mg Sulf 17.5-3.13-1.6 GM/177ML SOLN One kit contains 2 bottles.  Take both bottles by mouth at the times instructed by your provider.  354 mL 0   ondansetron (ZOFRAN) 4 MG tablet Take 1 tablet (4 mg total) by mouth every 8 (eight) hours as needed for nausea or vomiting. 30 tablet 0   No current facility-administered medications for this visit.    SUMMARY OF ONCOLOGIC HISTORY: Oncology History  CML (chronic myeloid leukemia)  03/01/2021 Initial Diagnosis   CML (chronic myeloid leukemia) (HCC)   03/01/2021 Pathology Results   BCR/ABL by FISH: 91% positive BRC/ABL by PCR: e13a2 (b2a2) by IS is 196.685%   04/25/2021 -  Chemotherapy   She started taking imatinib   05/15/2021 Pathology Results   BRC/ABL is detected by PCR: e13a2 (b2a2) by IS is 70.731%   06/17/2021 Pathology Results   e13a2 (b2a2) by Quantidex (IS): 39.7401%   07/24/2021 Pathology Results   e13a2 (b2a2) by Quantidex (IS): 33.3%   08/05/2021 -  Chemotherapy   She started taking Sprycel    08/30/2021 Pathology Results   e13a2 (b2a2) by Quantidex (IS): 9.8004%   11/18/2021 Pathology Results   e13a2 (b2a2) by Quantidex (IS): 0.8987%   12/17/2021 Pathology Results   e13a2 (b2a2) by Quantidex (IS): 0.4764%   02/24/2022 Pathology Results   e13a2 (b2a2) by Quantidex (IS): 0.2635%   04/18/2022 Pathology Results   e13a2 (b2a2) by Quantidex (IS): 0.2739%    05/27/2022 Pathology Results   e13a2 (b2a2) by Quantidex (IS): 0.3033     07/16/2022 Pathology Results   e13a2 (b2a2) by Quantidex (IS): 0.1576%   10/20/2022 Pathology Results   e13a2 (b2a2) by Quantidex (IS): 0.1384%     PHYSICAL EXAMINATION: ECOG PERFORMANCE STATUS: 1 - Symptomatic but completely ambulatory  Vitals:   10/30/22 1135  BP: 114/62  Pulse: 78  Resp: 18  Temp: 99.6 F (37.6 C)  SpO2: 100%   Filed Weights   10/30/22 1135  Weight: 148 lb 6.4 oz (67.3 kg)    GENERAL:alert, no distress and comfortable  NEURO: alert & oriented x 3 with fluent speech, no focal motor/sensory deficits  LABORATORY DATA:  I have reviewed the data as listed    Component Value Date/Time   NA 143  10/20/2022 1205   K 3.6 10/20/2022 1205   CL 110 10/20/2022 1205   CO2 26 10/20/2022 1205   GLUCOSE 103 (H) 10/20/2022 1205   BUN 15 10/20/2022 1205   CREATININE 0.96 10/20/2022 1205   CREATININE 0.95 07/18/2022 1206   CALCIUM 9.0 10/20/2022 1205   PROT 6.6 10/20/2022 1205   ALBUMIN 4.3 10/20/2022 1205   AST 22 10/20/2022 1205   AST 31 07/18/2022 1206   ALT 16 10/20/2022 1205   ALT 33 07/18/2022 1206   ALKPHOS 96 10/20/2022 1205   BILITOT 0.3 10/20/2022 1205   BILITOT 0.4 07/18/2022 1206   GFRNONAA >60 10/20/2022 1205   GFRNONAA >60 07/18/2022 1206   GFRAA >60 10/21/2019 1020    No results found for: "SPEP", "UPEP"  Lab Results  Component Value Date   WBC 7.1 10/20/2022   NEUTROABS 3.5 10/20/2022   HGB 13.7 10/20/2022   HCT 39.2 10/20/2022   MCV 95.4 10/20/2022   PLT 288 10/20/2022      Chemistry  Component Value Date/Time   NA 143 10/20/2022 1205   K 3.6 10/20/2022 1205   CL 110 10/20/2022 1205   CO2 26 10/20/2022 1205   BUN 15 10/20/2022 1205   CREATININE 0.96 10/20/2022 1205   CREATININE 0.95 07/18/2022 1206      Component Value Date/Time   CALCIUM 9.0 10/20/2022 1205   ALKPHOS 96 10/20/2022 1205   AST 22 10/20/2022 1205   AST 31 07/18/2022 1206   ALT 16 10/20/2022 1205   ALT 33 07/18/2022 1206   BILITOT 0.3 10/20/2022 1205   BILITOT 0.4 07/18/2022 1206

## 2022-10-30 NOTE — Telephone Encounter (Signed)
Good Afternoon Dr Leonides Schanz,  We have received a referral for this patient to have a colonoscopy from Dr Nicholos Johns. Patient was recently seen with [facility] in October 2023 but was recommended to you by a nurse that she ran into at work. Says she is unhappy with her care at [facility] and the provider she wanted to see is on medical leave. 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 Office notes available to view in Epic, please review them at your earliest convenience and advise on scheduling.  Thank You!

## 2022-10-30 NOTE — Assessment & Plan Note (Signed)
She tolerated increased dose Sprycel well Molecular studies show improvement although she is not in major molecular response We will continue treatment for another 3 months I shared with her recent publication related to long term treatment with Sprycel

## 2022-10-30 NOTE — Assessment & Plan Note (Signed)
She is contemplating bilateral mastectomy; for now she will continue aggressive screening modality

## 2022-10-30 NOTE — Assessment & Plan Note (Signed)
We discussed the importance of nicotine cessation ?She will try her best to quit smoking ?

## 2022-10-31 NOTE — Telephone Encounter (Signed)
LVM for patient to call back and schedule in [facility]

## 2022-11-04 ENCOUNTER — Other Ambulatory Visit (HOSPITAL_COMMUNITY): Payer: Self-pay

## 2022-11-04 NOTE — Telephone Encounter (Signed)
Patient called back, was scheduled for OV patient wished to see Dr. Leonides Schanz in the office first.

## 2022-11-05 ENCOUNTER — Other Ambulatory Visit (HOSPITAL_COMMUNITY): Payer: Self-pay

## 2022-11-11 ENCOUNTER — Other Ambulatory Visit (HOSPITAL_COMMUNITY): Payer: Self-pay

## 2022-11-25 ENCOUNTER — Ambulatory Visit: Payer: BC Managed Care – PPO | Admitting: Physical Medicine and Rehabilitation

## 2022-11-26 ENCOUNTER — Other Ambulatory Visit (HOSPITAL_COMMUNITY): Payer: Self-pay

## 2022-11-26 ENCOUNTER — Encounter: Payer: Self-pay | Admitting: Physical Medicine and Rehabilitation

## 2022-11-26 ENCOUNTER — Ambulatory Visit (INDEPENDENT_AMBULATORY_CARE_PROVIDER_SITE_OTHER): Payer: BC Managed Care – PPO | Admitting: Physical Medicine and Rehabilitation

## 2022-11-26 DIAGNOSIS — M47816 Spondylosis without myelopathy or radiculopathy, lumbar region: Secondary | ICD-10-CM | POA: Diagnosis not present

## 2022-11-26 DIAGNOSIS — M5416 Radiculopathy, lumbar region: Secondary | ICD-10-CM | POA: Diagnosis not present

## 2022-11-26 DIAGNOSIS — M48061 Spinal stenosis, lumbar region without neurogenic claudication: Secondary | ICD-10-CM | POA: Diagnosis not present

## 2022-11-26 MED ORDER — METAXALONE 400 MG PO TABS
400.0000 mg | ORAL_TABLET | Freq: Three times a day (TID) | ORAL | 0 refills | Status: AC | PRN
  Filled 2022-11-26: qty 30, 10d supply, fill #0

## 2022-11-26 NOTE — Progress Notes (Signed)
[name] - 57 y.o. female MRN [id number]  Date of birth: 1966/05/11  Office Visit Note: Visit Date: 11/26/2022 PCP: Georgianne Fick, MD Referred by: Georgianne Fick, MD  Subjective: Chief Complaint  Patient presents with   Lower Back - Pain   HPI: [name] is a 57 y.o. female who comes in today for evaluation of chronic, worsening and severe bilateral lower back pain radiating down right buttock, hip and down lateral leg to foot. Pain ongoing for several years, worsens with laying flat. She describes pain as persistent sore and aching sensation, currently rates as 8 out of 10. Reports difficulty sleeping and cramping to right leg. Some relief of pain with home exercise regimen, rest and use of medications. Does take Skelaxin intermittently as needed. She did attend 1 session of formal physical therapy in April, states she was more to establish home exercise regimen. Lumbar MRI imaging from November of 2023 exhibits multi level degenerative facet changes, moderate left subarticular stenosis at L4-L5, moderate right subarticular stenosis at L3-L4. No high grade spinal canal stenosis. Patient underwent left L4-L5 interlaminar epidural steroid injection performed on 06/09/2022 in our office provided greater than 50% relief of pain for over 3 months. She also reports increased functional ability post injection. Patient has plans to start exercise regimen at [facility]. She continues to care for her horses. Patient denies focal weakness, numbness and tingling. No recent trauma or falls.    Review of Systems  Musculoskeletal:  Positive for back pain.  Neurological:  Negative for tingling, sensory change, focal weakness and weakness.  All other systems reviewed and are negative.  Otherwise per HPI.  Assessment & Plan: Visit Diagnoses:    ICD-10-CM   1. Lumbar radiculopathy  M54.16 Ambulatory referral to Physical Medicine Rehab    2. Stenosis of lateral recess of lumbar  spine  M48.061 Ambulatory referral to Physical Medicine Rehab    3. Facet hypertrophy of lumbar region  M47.816 Ambulatory referral to Physical Medicine Rehab       Plan: Findings:  Chronic, worsening and severe bilateral lower back pain radiating down right buttock, hip and down lateral leg to foot. Patient continues to have severe pain despite good conservative therapies such as formal physical therapy, home exercise regimen, rest and use of medications. Patients clinical presentation and exam are consistent with L5 nerve pattern. Next step is to perform diagnostic and hopefully therapeutic right L5 transforaminal epidural steroid injection under fluoroscopic guidance. If good relief of pain with this injection we can repeat this procedure infrequently as needed. I also discussed medication management with patient today, I refilled Skelaxin for her to take intermittently as needed. Can look at re-grouping with physical therapy team. Patient instructed to continue with home exercise regimen. No red flag symptoms noted.     Meds & Orders:  Meds ordered this encounter  Medications   metaxalone (SKELAXIN) 400 MG tablet    Sig: Take 1 tablet (400 mg total) by mouth 3 (three) times daily as needed.    Dispense:  30 tablet    Refill:  0    Orders Placed This Encounter  Procedures   Ambulatory referral to Physical Medicine Rehab    Follow-up: Return for Right L5 transforaminal epidural steroid injection.   Procedures: No procedures performed      Clinical History: Narrative & Impression CLINICAL DATA:  Lower back pain. Acute left-sided low back pain with left-sided sciatica.   EXAM: MRI LUMBAR SPINE WITHOUT CONTRAST   TECHNIQUE:  Multiplanar, multisequence MR imaging of the lumbar spine was performed. No intravenous contrast was administered.   COMPARISON:  Radiographs Dec 17, 2021.   FINDINGS: Segmentation:  Standard.   Alignment: Levoconvex scoliosis. Small retrolisthesis at  L1-2 and L2-3.   Vertebrae: No fracture, evidence of discitis, or bone lesion. Endplate degenerative changes at L2-3 and L4-5.   Conus medullaris and cauda equina: Conus extends to the L1 level. Conus and cauda equina appear normal.   Paraspinal and other soft tissues: Negative.   Disc levels:   T11-12: Small left central disc protrusion causing small indentation of the thecal sac without significant spinal canal or neural foraminal stenosis.   T12-L1: No spinal canal or neural foraminal stenosis.   L1-2: Loss of disc height, disc bulge and mild facet degenerative changes resulting in mild bilateral neural foraminal narrowing. No spinal canal stenosis.   L2-3: Loss of disc height, disc bulge and mild facet degenerative changes resulting in mild bilateral neural foraminal narrowing. No significant spinal canal stenosis.   L3-4: Loss of disc height, right asymmetric disc bulge and mild facet degenerative change resulting in mild spinal canal stenosis with moderate right and mild left subarticular zone stenosis and mild right neural foraminal.   L4-5: Loss of disc height, disc bulge, mild right and moderate hypertrophic left facet degenerative changes resulting in mild right and moderate left subarticular zone stenosis, mild right and moderate left neural foraminal narrowing.   L5-S1: Shallow disc bulge and moderate facet degenerative changes without significant spinal canal or neural foraminal stenosis.   IMPRESSION: 1. 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 2. Moderate right subarticular zone stenosis and mild right neural foraminal narrowing at L3-4. 3. Mild bilateral neural foraminal narrowing at L1-2 and L2-3.     Electronically Signed   By: Baldemar Lenis M.D.   On: 05/25/2022 13:23   She reports that she has been smoking cigarettes. She has a 17.00 pack-year smoking history. She has never  used smokeless tobacco. No results for input(s): "HGBA1C", "LABURIC" in the last 8760 hours.  Objective:  VS:  HT:    WT:   BMI:     BP:   HR: bpm  TEMP: ( )  RESP:  Physical Exam Vitals and nursing note reviewed.  HENT:     Head: Normocephalic and atraumatic.     Right Ear: External ear normal.     Left Ear: External ear normal.     Nose: Nose normal.     Mouth/Throat:     Mouth: Mucous membranes are moist.  Eyes:     Extraocular Movements: Extraocular movements intact.  Cardiovascular:     Rate and Rhythm: Normal rate.     Pulses: Normal pulses.  Pulmonary:     Effort: Pulmonary effort is normal.  Abdominal:     General: Abdomen is flat. There is no distension.  Musculoskeletal:        General: Tenderness present.     Cervical back: Normal range of motion.     Comments: Patient rises from seated position to standing without difficulty. Good lumbar range of motion. No pain noted with facet loading. 5/5 strength noted with bilateral hip flexion, knee flexion/extension, ankle dorsiflexion/plantarflexion and EHL. No clonus noted bilaterally. No pain upon palpation of greater trochanters. No pain with internal/external rotation of bilateral hips. Sensation intact bilaterally. Dysesthesias noted to right L5 dermatome. Negative slump test bilaterally. Ambulates without aid, gait steady.  Skin:    General: Skin is warm and dry.     Capillary Refill: Capillary refill takes less than 2 seconds.  Neurological:     General: No focal deficit present.     Mental Status: She is alert and oriented to person, place, and time.  Psychiatric:        Mood and Affect: Mood normal.        Behavior: Behavior normal.     Ortho Exam  Imaging: No results found.  Past Medical/Family/Surgical/Social History: Medications & Allergies reviewed per EMR, new medications updated. 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   Family history of breast cancer 03/01/2021   Continuous dependence on cigarette smoking 03/01/2021   Past Medical History:  Diagnosis Date   Anxiety    Family history of pancreatic cancer 03/20/2021   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   Family History  Problem Relation Age of Onset   Breast cancer Mother        bilateral; dx 93, 58   Acute myelogenous leukemia Mother        dx late 42s   Pancreatic cancer Father 40   Breast cancer Maternal Aunt        dx after 50   Breast cancer Maternal Aunt        dx after 50   Bladder Cancer Paternal Uncle        dx after 90   Colon cancer Maternal Grandmother 94   Past Surgical History:  Procedure Laterality Date   ABDOMINAL HYSTERECTOMY     APPENDECTOMY     bowel removal     5 inches   BOWEL RESECTION     2 feet   BREAST BIOPSY Right 04/29/2019   APOCRINE METAPLASIA    BREAST LUMPECTOMY WITH RADIOACTIVE SEED LOCALIZATION Left 10/03/2021   Procedure: LEFT BREAST LUMPECTOMY WITH RADIOACTIVE SEED LOCALIZATION;  Surgeon: Griselda Miner, MD;  Location: [facility];  Service: General;  Laterality: Left;   Social History   Occupational History   Occupation: retired  Tobacco Use   Smoking status: Every Day    Packs/day: 1.00    Years: 17.00    Additional pack years: 0.00    Total pack years: 17.00    Types: Cigarettes   Smokeless tobacco: Never  Substance and Sexual Activity   Alcohol use: No   Drug use: No   Sexual activity: Yes

## 2022-11-26 NOTE — Progress Notes (Signed)
Functional Pain Scale - descriptive words and definitions  Moderate (4)   Constantly aware of pain, can complete ADLs with modification/sleep marginally affected at times/passive distraction is of no use, but active distraction gives some relief. Moderate range order  Average Pain 4  Lower back pain on the right side that is starting to go to the left. Radiation in both legs

## 2022-11-27 ENCOUNTER — Other Ambulatory Visit (HOSPITAL_COMMUNITY): Payer: Self-pay

## 2022-11-29 ENCOUNTER — Other Ambulatory Visit (HOSPITAL_COMMUNITY): Payer: Self-pay

## 2022-12-04 ENCOUNTER — Ambulatory Visit: Payer: BC Managed Care – PPO | Admitting: Internal Medicine

## 2022-12-05 ENCOUNTER — Other Ambulatory Visit (HOSPITAL_COMMUNITY): Payer: Self-pay

## 2022-12-08 ENCOUNTER — Ambulatory Visit
Admission: RE | Admit: 2022-12-08 | Discharge: 2022-12-08 | Disposition: A | Discharge status: Home / Self Care | Payer: BC Managed Care – PPO | Source: Ambulatory Visit | Attending: Surgery | Admitting: Surgery

## 2022-12-08 DIAGNOSIS — Z1501 Genetic susceptibility to malignant neoplasm of breast: Secondary | ICD-10-CM

## 2022-12-08 MED ORDER — GADOPICLENOL 0.5 MMOL/ML IV SOLN
7.0000 mL | Freq: Once | INTRAVENOUS | Status: AC | PRN
  Administered 2022-12-08: 7 mL via INTRAVENOUS

## 2022-12-10 ENCOUNTER — Other Ambulatory Visit: Payer: Self-pay

## 2022-12-10 ENCOUNTER — Other Ambulatory Visit (HOSPITAL_COMMUNITY): Payer: Self-pay

## 2022-12-10 ENCOUNTER — Ambulatory Visit (INDEPENDENT_AMBULATORY_CARE_PROVIDER_SITE_OTHER): Payer: BC Managed Care – PPO | Admitting: Physical Medicine and Rehabilitation

## 2022-12-10 ENCOUNTER — Telehealth: Payer: Self-pay | Admitting: Physical Medicine and Rehabilitation

## 2022-12-10 VITALS — BP 120/72 | HR 69

## 2022-12-10 DIAGNOSIS — M5416 Radiculopathy, lumbar region: Secondary | ICD-10-CM | POA: Diagnosis not present

## 2022-12-10 DIAGNOSIS — F411 Generalized anxiety disorder: Secondary | ICD-10-CM

## 2022-12-10 MED ORDER — DIAZEPAM 5 MG PO TABS
ORAL_TABLET | ORAL | 0 refills | Status: DC
  Filled 2022-12-10: qty 2, 1d supply, fill #0

## 2022-12-10 MED ORDER — METHYLPREDNISOLONE ACETATE 80 MG/ML IJ SUSP
80.0000 mg | Freq: Once | INTRAMUSCULAR | Status: AC
Start: 2022-12-10 — End: 2022-12-10
  Administered 2022-12-10: 80 mg

## 2022-12-10 NOTE — Patient Instructions (Signed)

## 2022-12-10 NOTE — Telephone Encounter (Signed)
She had vasovagal with numbing, could not complete injection, will need to call her and get her back in for same injection with valium. Valium put in today.

## 2022-12-10 NOTE — Procedures (Signed)
Lumbosacral Transforaminal Epidural Steroid Injection - Sub-Pedicular Approach with Fluoroscopic Guidance  Patient: [name]      Date of Birth: October 06, 1965 MRN: [id number] PCP: Georgianne Fick, MD      Visit Date: 12/10/2022   Universal Protocol:    Date/Time: 12/10/2022  Consent Given By: the patient  Position: PRONE  Additional Comments: Vital signs were monitored before and after the procedure. Patient was prepped and draped in the usual sterile fashion. The correct patient, procedure, and site was verified.   Injection Procedure Details:   Procedure diagnoses: Lumbar radiculopathy [M54.16]    Meds Administered:  Meds ordered this encounter  Medications   methylPREDNISolone acetate (DEPO-MEDROL) injection 80 mg    Laterality: Right  Location/Site: L5  Needle:5.0 in., 22 ga.  Short bevel or Quincke spinal needle  Needle Placement: Transforaminal  Findings:    -Comments: Excellent flow of contrast along the nerve, nerve root and into the epidural space.  Procedure Details: After squaring off the end-plates to get a true AP view, the C-arm was positioned so that an oblique view of the foramen as noted above was visualized. The target area is just inferior to the "nose of the scotty dog" or sub pedicular. The soft tissues overlying this structure were infiltrated with 2-3 ml. of 1% Lidocaine without Epinephrine.  The spinal needle was inserted toward the target using a "trajectory" view along the fluoroscope beam.  Under AP and lateral visualization, the needle was advanced so it did not puncture dura and was located close the 6 O'Clock position of the pedical in AP tracterory. Biplanar projections were used to confirm position. Aspiration was confirmed to be negative for CSF and/or blood. A 1-2 ml. volume of Isovue-250 was injected and flow of contrast was noted at each level. Radiographs were obtained for documentation purposes.   After attaining the  desired flow of contrast documented above, a 0.5 to 1.0 ml test dose of 0.25% Marcaine was injected into each respective transforaminal space.  The patient was observed for 90 seconds post injection.  After no sensory deficits were reported, and normal lower extremity motor function was noted,   the above injectate was administered so that equal amounts of the injectate were placed at each foramen (level) into the transforaminal epidural space.   Additional Comments:  No complications occurred Dressing: 2 x 2 sterile gauze and Band-Aid    Post-procedure details: Patient was observed during the procedure. Post-procedure instructions were reviewed.  Patient left the clinic in stable condition.

## 2022-12-10 NOTE — Progress Notes (Addendum)
[name] - 57 y.o. female MRN [id number]  Date of birth: [birth year]  Office Visit Note: Visit Date: 12/10/2022 PCP: Georgianne Fick, MD Referred by: Georgianne Fick, MD  Subjective: No chief complaint on file.  HPI:  [name] is a 57 y.o. female who comes in today at the request of Ellin Goodie, FNP for planned Right L5-S1 Lumbar Transforaminal epidural steroid injection with fluoroscopic guidance.  The patient has failed conservative care including home exercise, medications, time and activity modification.  This injection will be diagnostic and hopefully therapeutic.  Please see requesting physician notes for further details and justification.  By way of review patient did well with prior interlaminar injection of her left-sided hip and leg pain.  MRI does not show much on the way on the right.  There is some lateral recess narrowing at L3-4 the symptoms are more L5 clinically.  Depending on results today would look at regrouping with potential for new imaging versus facet joint block or other therapy.  She did tell me today that the left side is starting to seemingly start to hurt again.  Try to encourage her that may not be a sign that is just coming back but it may come and go some.  She is to call us if that returns as well.  Please disregard templated part of the procedure note below.  We numbed the skin up using ethyl chloride and lidocaine and the patient became vasovagal and wanted to discontinue the procedure at that point.  We have completed about 50% of the procedure other than just kneeling down to completing the injection itself.  Will have her come back with the use of preprocedure Valium as an oral sedation I think that may help her.  She did not have a problem with the prior injection using some Valium ahead of time.   ROS Otherwise per HPI.  Assessment & Plan: Visit Diagnoses:    ICD-10-CM   1. Lumbar radiculopathy  M54.16 XR C-ARM NO REPORT     Epidural Steroid injection    methylPREDNISolone acetate (DEPO-MEDROL) injection 80 mg      Plan: No additional findings.   Meds & Orders:  Meds ordered this encounter  Medications   methylPREDNISolone acetate (DEPO-MEDROL) injection 80 mg    Orders Placed This Encounter  Procedures   XR C-ARM NO REPORT   Epidural Steroid injection    Follow-up: Return for visit to requesting provider as needed.   Procedures: No procedures performed  Lumbosacral Transforaminal Epidural Steroid Injection - Sub-Pedicular Approach with Fluoroscopic Guidance  Patient: [name]      Date of Birth: 1966/07/21 MRN: [id number] PCP: Georgianne Fick, MD      Visit Date: 12/10/2022   Universal Protocol:    Date/Time: 12/10/2022  Consent Given By: the patient  Position: PRONE  Additional Comments: Vital signs were monitored before and after the procedure. Patient was prepped and draped in the usual sterile fashion. The correct patient, procedure, and site was verified.   Injection Procedure Details:   Procedure diagnoses: Lumbar radiculopathy [M54.16]    Meds Administered:  Meds ordered this encounter  Medications   methylPREDNISolone acetate (DEPO-MEDROL) injection 80 mg    Laterality: Right  Location/Site: L5  Needle:5.0 in., 22 ga.  Short bevel or Quincke spinal needle  Needle Placement: Transforaminal  Findings:    -Comments: Excellent flow of contrast along the nerve, nerve root and into the epidural space.  Procedure Details: After squaring off the  end-plates to get a true AP view, the C-arm was positioned so that an oblique view of the foramen as noted above was visualized. The target area is just inferior to the "nose of the scotty dog" or sub pedicular. The soft tissues overlying this structure were infiltrated with 2-3 ml. of 1% Lidocaine without Epinephrine.  The spinal needle was inserted toward the target using a "trajectory" view along the fluoroscope  beam.  Under AP and lateral visualization, the needle was advanced so it did not puncture dura and was located close the 6 O'Clock position of the pedical in AP tracterory. Biplanar projections were used to confirm position. Aspiration was confirmed to be negative for CSF and/or blood. A 1-2 ml. volume of Isovue-250 was injected and flow of contrast was noted at each level. Radiographs were obtained for documentation purposes.   After attaining the desired flow of contrast documented above, a 0.5 to 1.0 ml test dose of 0.25% Marcaine was injected into each respective transforaminal space.  The patient was observed for 90 seconds post injection.  After no sensory deficits were reported, and normal lower extremity motor function was noted,   the above injectate was administered so that equal amounts of the injectate were placed at each foramen (level) into the transforaminal epidural space.   Additional Comments:  No complications occurred Dressing: 2 x 2 sterile gauze and Band-Aid    Post-procedure details: Patient was observed during the procedure. Post-procedure instructions were reviewed.  Patient left the clinic in stable condition.    Clinical History: Narrative & Impression CLINICAL DATA:  Lower back pain. Acute left-sided low back pain with left-sided sciatica.   EXAM: MRI LUMBAR SPINE WITHOUT CONTRAST   TECHNIQUE: Multiplanar, multisequence MR imaging of the lumbar spine was performed. No intravenous contrast was administered.   COMPARISON:  Radiographs Dec 17, 2021.   FINDINGS: Segmentation:  Standard.   Alignment: Levoconvex scoliosis. Small retrolisthesis at L1-2 and L2-3.   Vertebrae: No fracture, evidence of discitis, or bone lesion. Endplate degenerative changes at L2-3 and L4-5.   Conus medullaris and cauda equina: Conus extends to the L1 level. Conus and cauda equina appear normal.   Paraspinal and other soft tissues: Negative.   Disc levels:   T11-12:  Small left central disc protrusion causing small indentation of the thecal sac without significant spinal canal or neural foraminal stenosis.   T12-L1: No spinal canal or neural foraminal stenosis.   L1-2: Loss of disc height, disc bulge and mild facet degenerative changes resulting in mild bilateral neural foraminal narrowing. No spinal canal stenosis.   L2-3: Loss of disc height, disc bulge and mild facet degenerative changes resulting in mild bilateral neural foraminal narrowing. No significant spinal canal stenosis.   L3-4: Loss of disc height, right asymmetric disc bulge and mild facet degenerative change resulting in mild spinal canal stenosis with moderate right and mild left subarticular zone stenosis and mild right neural foraminal.   L4-5: Loss of disc height, disc bulge, mild right and moderate hypertrophic left facet degenerative changes resulting in mild right and moderate left subarticular zone stenosis, mild right and moderate left neural foraminal narrowing.   L5-S1: Shallow disc bulge and moderate facet degenerative changes without significant spinal canal or neural foraminal stenosis.   IMPRESSION: 1. 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 2. Moderate right subarticular zone stenosis and mild right neural foraminal narrowing at L3-4. 3. Mild bilateral neural foraminal narrowing at L1-2 and L2-3.  Electronically Signed   By: Baldemar Lenis M.D.   On: 05/25/2022 13:23     Objective:  VS:  HT:    WT:   BMI:     BP:120/72  HR:69bpm  TEMP: ( )  RESP:  Physical Exam Vitals and nursing note reviewed.  Constitutional:      General: She is not in acute distress.    Appearance: Normal appearance. She is not ill-appearing.  HENT:     Head: Normocephalic and atraumatic.     Right Ear: External ear normal.     Left Ear: External ear normal.  Eyes:     Extraocular Movements:  Extraocular movements intact.  Cardiovascular:     Rate and Rhythm: Normal rate.     Pulses: Normal pulses.  Pulmonary:     Effort: Pulmonary effort is normal. No respiratory distress.  Abdominal:     General: There is no distension.     Palpations: Abdomen is soft.  Musculoskeletal:        General: Tenderness present.     Cervical back: Neck supple.     Right lower leg: No edema.     Left lower leg: No edema.     Comments: Patient has good distal strength with no pain over the greater trochanters.  No clonus or focal weakness.  Skin:    Findings: No erythema, lesion or rash.  Neurological:     General: No focal deficit present.     Mental Status: She is alert and oriented to person, place, and time.     Sensory: No sensory deficit.     Motor: No weakness or abnormal muscle tone.     Coordination: Coordination normal.  Psychiatric:        Mood and Affect: Mood normal.        Behavior: Behavior normal.      Imaging: No results found.

## 2022-12-10 NOTE — Progress Notes (Signed)
Functional Pain Scale - descriptive words and definitions  Distracting (5)    Aware of pain/able to complete some ADL's but limited by pain/sleep is affected and active distractions are only slightly useful. Moderate range order  Average Pain 6   +Driver, -BT, -Dye Allergies. Yes driver, no blood thinner, no dye allergies  120/72  right arm 69 pulse

## 2022-12-11 ENCOUNTER — Other Ambulatory Visit: Payer: Self-pay | Admitting: Women's Health

## 2022-12-11 DIAGNOSIS — R9389 Abnormal findings on diagnostic imaging of other specified body structures: Secondary | ICD-10-CM

## 2022-12-11 NOTE — Telephone Encounter (Signed)
Patient is scheduled for 12-18-22

## 2022-12-17 ENCOUNTER — Telehealth: Payer: Self-pay

## 2022-12-17 NOTE — Telephone Encounter (Signed)
Spoke with patient and she is wanting to know if appointment can be moved up due to her having a biopsy early in morning. She is afraid to take her Lorazepam with the Diazepam for her injection. She wants to know if she can take 1 Diazepam in the morning for her biopsy and the other for the injection and 1 more Diazepam called in just in case she needs it for the injection. She wants to make sure the first one does not wear off before she takes the 2nd one. Please advise.

## 2022-12-18 ENCOUNTER — Ambulatory Visit
Admission: RE | Admit: 2022-12-18 | Discharge: 2022-12-18 | Disposition: A | Discharge status: Home / Self Care | Payer: BC Managed Care – PPO | Source: Ambulatory Visit | Attending: Women's Health | Admitting: Women's Health

## 2022-12-18 ENCOUNTER — Other Ambulatory Visit (HOSPITAL_COMMUNITY): Payer: Self-pay

## 2022-12-18 ENCOUNTER — Ambulatory Visit: Payer: BC Managed Care – PPO | Admitting: Physical Medicine and Rehabilitation

## 2022-12-18 ENCOUNTER — Other Ambulatory Visit: Payer: Self-pay | Admitting: Physical Medicine and Rehabilitation

## 2022-12-18 ENCOUNTER — Other Ambulatory Visit: Payer: Self-pay

## 2022-12-18 VITALS — BP 106/68 | HR 64

## 2022-12-18 DIAGNOSIS — R9389 Abnormal findings on diagnostic imaging of other specified body structures: Secondary | ICD-10-CM

## 2022-12-18 DIAGNOSIS — M5416 Radiculopathy, lumbar region: Secondary | ICD-10-CM

## 2022-12-18 MED ORDER — DIAZEPAM 5 MG PO TABS
ORAL_TABLET | ORAL | 0 refills | Status: AC
  Filled 2022-12-18: qty 2, 1d supply, fill #0

## 2022-12-18 MED ORDER — METHYLPREDNISOLONE ACETATE 80 MG/ML IJ SUSP
80.0000 mg | Freq: Once | INTRAMUSCULAR | Status: AC
Start: 2022-12-18 — End: 2022-12-18
  Administered 2022-12-18: 80 mg

## 2022-12-18 MED ORDER — GADOPICLENOL 0.5 MMOL/ML IV SOLN
7.0000 mL | Freq: Once | INTRAVENOUS | Status: AC | PRN
  Administered 2022-12-18: 7 mL via INTRAVENOUS

## 2022-12-18 NOTE — Procedures (Signed)
Lumbosacral Transforaminal Epidural Steroid Injection - Sub-Pedicular Approach with Fluoroscopic Guidance  Patient: [name]      Date of Birth: 10-21-1965 MRN: [id number] PCP: Georgianne Fick, MD      Visit Date: 12/18/2022   Universal Protocol:    Date/Time: 12/18/2022  Consent Given By: the patient  Position: PRONE  Additional Comments: Vital signs were monitored before and after the procedure. Patient was prepped and draped in the usual sterile fashion. The correct patient, procedure, and site was verified.   Injection Procedure Details:   Procedure diagnoses: Lumbar radiculopathy [M54.16]    Meds Administered:  Meds ordered this encounter  Medications   methylPREDNISolone acetate (DEPO-MEDROL) injection 80 mg    Laterality: Right  Location/Site: L5  Needle:5.0 in., 22 ga.  Short bevel or Quincke spinal needle  Needle Placement: Transforaminal  Findings:    -Comments: Excellent flow of contrast along the nerve, nerve root and into the epidural space.  Once about 95% done with the needle placed at the foramen the patient did ask for her sister and I did tell him that we were essentially done except for some medication at that point.  I did place the medication without any difficulty.  She did have the start of vasovagal response again after the injection was completed.  We were able to complete the injection today.  Procedure Details: After squaring off the end-plates to get a true AP view, the C-arm was positioned so that an oblique view of the foramen as noted above was visualized. The target area is just inferior to the "nose of the scotty dog" or sub pedicular. The soft tissues overlying this structure were infiltrated with 2-3 ml. of 1% Lidocaine without Epinephrine.  The spinal needle was inserted toward the target using a "trajectory" view along the fluoroscope beam.  Under AP and lateral visualization, the needle was advanced so it did not puncture  dura and was located close the 6 O'Clock position of the pedical in AP tracterory. Biplanar projections were used to confirm position. Aspiration was confirmed to be negative for CSF and/or blood. A 1-2 ml. volume of Isovue-250 was injected and flow of contrast was noted at each level. Radiographs were obtained for documentation purposes.   After attaining the desired flow of contrast documented above, a 0.5 to 1.0 ml test dose of 0.25% Marcaine was injected into each respective transforaminal space.  The patient was observed for 90 seconds post injection.  After no sensory deficits were reported, and normal lower extremity motor function was noted,   the above injectate was administered so that equal amounts of the injectate were placed at each foramen (level) into the transforaminal epidural space.   Additional Comments:  No complications occurred Dressing: 2 x 2 sterile gauze and Band-Aid    Post-procedure details: Patient was observed during the procedure. Post-procedure instructions were reviewed.  Patient left the clinic in stable condition.

## 2022-12-18 NOTE — Progress Notes (Signed)
Functional Pain Scale - descriptive words and definitions  Distressing (6)    Pain is present/unable to complete most ADLs limited by pain/sleep is difficult and active distraction is only marginal. Moderate range order  Average Pain  varies   +Driver, -BT, -Dye Allergies.  Lower back pain on right side with some radiation in right hip and constantly in the right calf

## 2022-12-18 NOTE — Progress Notes (Signed)
[name] - 57 y.o. female MRN [id number]  Date of birth: 1965-12-29  Office Visit Note: Visit Date: 12/18/2022 PCP: Georgianne Fick, MD Referred by: Georgianne Fick, MD  Subjective: Chief Complaint  Patient presents with   Lower Back - Pain   HPI:  [name] is a 57 y.o. female who comes in today at the request of Ellin Goodie, FNP for planned Right L5-S1 Lumbar Transforaminal epidural steroid injection with fluoroscopic guidance.  The patient has failed conservative care including home exercise, medications, time and activity modification.  This injection will be diagnostic and hopefully therapeutic.  Please see requesting physician notes for further details and justification.  Per last attempt at this injection the patient did vasovagal after numbing the skin.  Today we have her on some Valium that she also took prior to a biopsy earlier in the day.  She reports doing pretty well with the biopsy.  Her sister is present who will be outside the room.   ROS Otherwise per HPI.  Assessment & Plan: Visit Diagnoses:    ICD-10-CM   1. Lumbar radiculopathy  M54.16 XR C-ARM NO REPORT    Epidural Steroid injection    methylPREDNISolone acetate (DEPO-MEDROL) injection 80 mg      Plan: No additional findings.   Meds & Orders:  Meds ordered this encounter  Medications   methylPREDNISolone acetate (DEPO-MEDROL) injection 80 mg    Orders Placed This Encounter  Procedures   XR C-ARM NO REPORT   Epidural Steroid injection    Follow-up: Return for visit to requesting provider as needed.   Procedures: No procedures performed  Lumbosacral Transforaminal Epidural Steroid Injection - Sub-Pedicular Approach with Fluoroscopic Guidance  Patient: AZHIA York      Date of Birth: 06-22-1966 MRN: [id number] PCP: Georgianne Fick, MD      Visit Date: 12/18/2022   Universal Protocol:    Date/Time: 12/18/2022  Consent Given By: the patient  Position:  PRONE  Additional Comments: Vital signs were monitored before and after the procedure. Patient was prepped and draped in the usual sterile fashion. The correct patient, procedure, and site was verified.   Injection Procedure Details:   Procedure diagnoses: Lumbar radiculopathy [M54.16]    Meds Administered:  Meds ordered this encounter  Medications   methylPREDNISolone acetate (DEPO-MEDROL) injection 80 mg    Laterality: Right  Location/Site: L5  Needle:5.0 in., 22 ga.  Short bevel or Quincke spinal needle  Needle Placement: Transforaminal  Findings:    -Comments: Excellent flow of contrast along the nerve, nerve root and into the epidural space.  Once about 95% done with the needle placed at the foramen the patient did ask for her sister and I did tell him that we were essentially done except for some medication at that point.  I did place the medication without any difficulty.  She did have the start of vasovagal response again after the injection was completed.  We were able to complete the injection today.  Procedure Details: After squaring off the end-plates to get a true AP view, the C-arm was positioned so that an oblique view of the foramen as noted above was visualized. The target area is just inferior to the "nose of the scotty dog" or sub pedicular. The soft tissues overlying this structure were infiltrated with 2-3 ml. of 1% Lidocaine without Epinephrine.  The spinal needle was inserted toward the target using a "trajectory" view along the fluoroscope beam.  Under AP and lateral visualization, the  needle was advanced so it did not puncture dura and was located close the 6 O'Clock position of the pedical in AP tracterory. Biplanar projections were used to confirm position. Aspiration was confirmed to be negative for CSF and/or blood. A 1-2 ml. volume of Isovue-250 was injected and flow of contrast was noted at each level. Radiographs were obtained for documentation  purposes.   After attaining the desired flow of contrast documented above, a 0.5 to 1.0 ml test dose of 0.25% Marcaine was injected into each respective transforaminal space.  The patient was observed for 90 seconds post injection.  After no sensory deficits were reported, and normal lower extremity motor function was noted,   the above injectate was administered so that equal amounts of the injectate were placed at each foramen (level) into the transforaminal epidural space.   Additional Comments:  No complications occurred Dressing: 2 x 2 sterile gauze and Band-Aid    Post-procedure details: Patient was observed during the procedure. Post-procedure instructions were reviewed.  Patient left the clinic in stable condition.    Clinical History: Narrative & Impression CLINICAL DATA:  Lower back pain. Acute left-sided low back pain with left-sided sciatica.   EXAM: MRI LUMBAR SPINE WITHOUT CONTRAST   TECHNIQUE: Multiplanar, multisequence MR imaging of the lumbar spine was performed. No intravenous contrast was administered.   COMPARISON:  Radiographs Dec 17, 2021.   FINDINGS: Segmentation:  Standard.   Alignment: Levoconvex scoliosis. Small retrolisthesis at L1-2 and L2-3.   Vertebrae: No fracture, evidence of discitis, or bone lesion. Endplate degenerative changes at L2-3 and L4-5.   Conus medullaris and cauda equina: Conus extends to the L1 level. Conus and cauda equina appear normal.   Paraspinal and other soft tissues: Negative.   Disc levels:   T11-12: Small left central disc protrusion causing small indentation of the thecal sac without significant spinal canal or neural foraminal stenosis.   T12-L1: No spinal canal or neural foraminal stenosis.   L1-2: Loss of disc height, disc bulge and mild facet degenerative changes resulting in mild bilateral neural foraminal narrowing. No spinal canal stenosis.   L2-3: Loss of disc height, disc bulge and mild facet  degenerative changes resulting in mild bilateral neural foraminal narrowing. No significant spinal canal stenosis.   L3-4: Loss of disc height, right asymmetric disc bulge and mild facet degenerative change resulting in mild spinal canal stenosis with moderate right and mild left subarticular zone stenosis and mild right neural foraminal.   L4-5: Loss of disc height, disc bulge, mild right and moderate hypertrophic left facet degenerative changes resulting in mild right and moderate left subarticular zone stenosis, mild right and moderate left neural foraminal narrowing.   L5-S1: Shallow disc bulge and moderate facet degenerative changes without significant spinal canal or neural foraminal stenosis.   IMPRESSION: 1. 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 2. Moderate right subarticular zone stenosis and mild right neural foraminal narrowing at L3-4. 3. Mild bilateral neural foraminal narrowing at L1-2 and L2-3.     Electronically Signed   By: Baldemar Lenis M.D.   On: 05/25/2022 13:23     Objective:  VS:  HT:    WT:   BMI:     BP:106/68  HR:64bpm  TEMP: ( )  RESP:  Physical Exam Vitals and nursing note reviewed.  Constitutional:      General: She is not in acute distress.    Appearance: Normal appearance. She is not  ill-appearing.  HENT:     Head: Normocephalic and atraumatic.     Right Ear: External ear normal.     Left Ear: External ear normal.  Eyes:     Extraocular Movements: Extraocular movements intact.  Cardiovascular:     Rate and Rhythm: Normal rate.     Pulses: Normal pulses.  Pulmonary:     Effort: Pulmonary effort is normal. No respiratory distress.  Abdominal:     General: There is no distension.     Palpations: Abdomen is soft.  Musculoskeletal:        General: Tenderness present.     Cervical back: Neck supple.     Right lower leg: No edema.     Left lower leg: No edema.      Comments: Patient has good distal strength with no pain over the greater trochanters.  No clonus or focal weakness.  Skin:    Findings: No erythema, lesion or rash.  Neurological:     General: No focal deficit present.     Mental Status: She is alert and oriented to person, place, and time.     Sensory: No sensory deficit.     Motor: No weakness or abnormal muscle tone.     Coordination: Coordination normal.  Psychiatric:        Mood and Affect: Mood normal.        Behavior: Behavior normal.      Imaging: MR RT BREAST BX W LOC DEV 1ST LESION IMAGE BX SPEC MR GUIDE  Result Date: 12/18/2022 CLINICAL DATA:  Patient presents for MRI guided biopsy of an enhancing mass in the right breast at the 12 o'clock position. EXAM: MRI GUIDED CORE NEEDLE BIOPSY OF THE RIGHT BREAST TECHNIQUE: Multiplanar, multisequence MR imaging of the right breast was performed both before and after administration of intravenous contrast. CONTRAST:  7 mL of Vueway COMPARISON:  Previous exam(s). FINDINGS: I met with the patient, and we discussed the procedure of MRI guided biopsy, including risks, benefits, and alternatives. Specifically, we discussed the risks of infection, bleeding, tissue injury, clip migration, and inadequate sampling. Informed, written consent was given. The usual time out protocol was performed immediately prior to the procedure. Using sterile technique, 1% Lidocaine, MRI guidance, and a 9 gauge vacuum assisted device, biopsy was performed of the enhancing mass in the right breast at the 12 o'clock position using a lateral to medial approach. At the conclusion of the procedure, a barbell tissue marker clip was deployed into the biopsy cavity. Follow-up 2-view mammogram was performed and dictated separately. IMPRESSION: MRI guided biopsy of the enhancing mass in the right breast at the 12 o'clock position. No apparent complications. Electronically Signed   By: Edwin Cap M.D.   On: 12/18/2022 09:45    MM CLIP PLACEMENT RIGHT  Result Date: 12/18/2022 CLINICAL DATA:  Post MRI guided biopsy of an enhancing mass in the right breast at the 12 o'clock position. EXAM: 3D DIAGNOSTIC RIGHT MAMMOGRAM POST MRI BIOPSY COMPARISON:  Previous exam(s). FINDINGS: 3D Mammographic images were obtained following MRI guided biopsy of an enhancing mass in the right breast at the 12 o'clock position. A barbell shaped biopsy marking clip is present at the site of the biopsied mass in the right breast at the 12 o'clock position. IMPRESSION: Barbell shaped biopsy marking clip at site of biopsied mass in the superior right breast at the approximate 12 o'clock position. Final Assessment: Post Procedure Mammograms for Marker Placement Electronically Signed   By: Victorino Dike  Derinda Late M.D.   On: 12/18/2022 09:44

## 2022-12-18 NOTE — Patient Instructions (Signed)

## 2023-01-01 NOTE — Progress Notes (Unsigned)
Faxed Dr. Bertis Ruddy 10/30/22 office note and letter from [facility] to [phone] as requested for PA BCR/ABL test. Received fax confirmation.

## 2023-01-05 ENCOUNTER — Other Ambulatory Visit: Payer: Self-pay | Admitting: General Surgery

## 2023-01-05 DIAGNOSIS — N6489 Other specified disorders of breast: Secondary | ICD-10-CM

## 2023-01-06 ENCOUNTER — Other Ambulatory Visit (HOSPITAL_COMMUNITY): Payer: Self-pay

## 2023-01-09 ENCOUNTER — Other Ambulatory Visit: Payer: Self-pay | Admitting: General Surgery

## 2023-01-09 ENCOUNTER — Other Ambulatory Visit (HOSPITAL_COMMUNITY): Payer: Self-pay

## 2023-01-09 DIAGNOSIS — N6489 Other specified disorders of breast: Secondary | ICD-10-CM

## 2023-01-13 ENCOUNTER — Other Ambulatory Visit (HOSPITAL_COMMUNITY): Payer: Self-pay

## 2023-01-16 ENCOUNTER — Encounter: Payer: Self-pay | Admitting: Hematology and Oncology

## 2023-01-17 ENCOUNTER — Other Ambulatory Visit (HOSPITAL_COMMUNITY): Payer: Self-pay

## 2023-01-27 NOTE — Pre-Procedure Instructions (Signed)
Surgical Instructions    Your procedure is scheduled on February 04, 2023.  Report to [facility] Main Entrance "A" at 11:00 A.M., then check in with the Admitting office.  Call this number if you have problems the morning of surgery:  [phone]  If you have any questions prior to your surgery date call [phone]: Open Monday-Friday 8am-4pm If you experience any cold or flu symptoms such as cough, fever, chills, shortness of breath, etc. between now and your scheduled surgery, please notify us at the above number.     Remember:  Do not eat after midnight the night before your surgery  You may drink clear liquids until 10:00 AM the morning of your surgery.   Clear liquids allowed are: Water, Non-Citrus Juices (without pulp), Carbonated Beverages, Clear Tea, Black Coffee Only (NO MILK, CREAM OR POWDERED CREAMER of any kind), and Gatorade.     Take these medicines the morning of surgery with A SIP OF WATER:  escitalopram (LEXAPRO)   loperamide (IMODIUM)     May take these medicines IF NEEDED:  acetaminophen (TYLENOL)   clonazePAM (KLONOPIN)   fexofenadine (ALLEGRA)   metaxalone (SKELAXIN)    As of today, STOP taking any Aspirin (unless otherwise instructed by your surgeon) Aleve, Naproxen, Ibuprofen, Motrin, Advil, Goody's, BC's, all herbal medications, fish oil, and all vitamins.                     Do NOT Smoke (Tobacco/Vaping) for 24 hours prior to your procedure.  If you use a CPAP at night, you may bring your mask/headgear for your overnight stay.   Contacts, glasses, piercing's, hearing aid's, dentures or partials may not be worn into surgery, please bring cases for these belongings.    For patients admitted to the hospital, discharge time will be determined by your treatment team.   Patients discharged the day of surgery will not be allowed to drive home, and someone needs to stay with them for 24 hours.  SURGICAL WAITING ROOM VISITATION Patients having surgery or a  procedure may have no more than 2 support people in the waiting area - these visitors may rotate.   Children under the age of 33 must have an adult with them who is not the patient. If the patient needs to stay at the hospital during part of their recovery, the visitor guidelines for inpatient rooms apply. Pre-op nurse will coordinate an appropriate time for 1 support person to accompany patient in pre-op.  This support person may not rotate.   Please refer to the [facility] website for the visitor guidelines for Inpatients (after your surgery is over and you are in a regular room).    Special instructions:   [facility]- Preparing For Surgery  Before surgery, you can play an important role. Because skin is not sterile, your skin needs to be as free of germs as possible. You can reduce the number of germs on your skin by washing with CHG (chlorahexidine gluconate) Soap before surgery.  CHG is an antiseptic cleaner which kills germs and bonds with the skin to continue killing germs even after washing.    Oral Hygiene is also important to reduce your risk of infection.  Remember - BRUSH YOUR TEETH THE MORNING OF SURGERY WITH YOUR REGULAR TOOTHPASTE  Please do not use if you have an allergy to CHG or antibacterial soaps. If your skin becomes reddened/irritated stop using the CHG.  Do not shave (including legs and underarms) for at least  48 hours prior to first CHG shower. It is OK to shave your face.  Please follow these instructions carefully.   Shower the NIGHT BEFORE SURGERY and the MORNING OF SURGERY  If you chose to wash your hair, wash your hair first as usual with your normal shampoo.  After you shampoo, rinse your hair and body thoroughly to remove the shampoo.  Use CHG Soap as you would any other liquid soap. You can apply CHG directly to the skin and wash gently with a scrungie or a clean washcloth.   Apply the CHG Soap to your body ONLY FROM THE NECK DOWN.  Do not use on open  wounds or open sores. Avoid contact with your eyes, ears, mouth and genitals (private parts). Wash Face and genitals (private parts)  with your normal soap.   Wash thoroughly, paying special attention to the area where your surgery will be performed.  Thoroughly rinse your body with warm water from the neck down.  DO NOT shower/wash with your normal soap after using and rinsing off the CHG Soap.  Pat yourself dry with a CLEAN TOWEL.  Wear CLEAN PAJAMAS to bed the night before surgery  Place CLEAN SHEETS on your bed the night before your surgery  DO NOT SLEEP WITH PETS.   Day of Surgery: Take a shower with CHG soap. Do not wear jewelry or makeup Do not wear lotions, powders, perfumes/colognes, or deodorant. Do not shave 48 hours prior to surgery.  Men may shave face and neck. Do not bring valuables to the hospital.  [facility] is not responsible for any belongings or valuables. Do not wear nail polish, gel polish, artificial nails, or any other type of covering on natural nails (fingers and toes) If you have artificial nails or gel coating that need to be removed by a nail salon, please have this removed prior to surgery. Artificial nails or gel coating may interfere with anesthesia's ability to adequately monitor your vital signs.  Wear Clean/Comfortable clothing the morning of surgery Remember to brush your teeth WITH YOUR REGULAR TOOTHPASTE.   Please read over the following fact sheets that you were given.    If you received a COVID test during your pre-op visit  it is requested that you wear a mask when out in public, stay away from anyone that may not be feeling well and notify your surgeon if you develop symptoms. If you have been in contact with anyone that has tested positive in the last 10 days please notify you surgeon.

## 2023-01-28 ENCOUNTER — Encounter (HOSPITAL_COMMUNITY)
Admission: RE | Admit: 2023-01-28 | Discharge: 2023-01-28 | Disposition: A | Discharge status: Home / Self Care | Payer: BC Managed Care – PPO | Source: Ambulatory Visit | Attending: General Surgery | Admitting: General Surgery

## 2023-01-28 ENCOUNTER — Encounter: Payer: Self-pay | Admitting: Internal Medicine

## 2023-01-28 ENCOUNTER — Other Ambulatory Visit (HOSPITAL_COMMUNITY): Payer: BC Managed Care – PPO

## 2023-01-28 ENCOUNTER — Other Ambulatory Visit: Payer: Self-pay

## 2023-01-28 ENCOUNTER — Encounter (HOSPITAL_COMMUNITY): Payer: Self-pay

## 2023-01-28 ENCOUNTER — Ambulatory Visit: Payer: BC Managed Care – PPO | Admitting: Internal Medicine

## 2023-01-28 ENCOUNTER — Other Ambulatory Visit (HOSPITAL_COMMUNITY): Payer: Self-pay

## 2023-01-28 VITALS — BP 115/72 | HR 81 | Temp 99.0°F | Resp 17 | Ht 64.0 in | Wt 145.5 lb

## 2023-01-28 VITALS — BP 110/64 | HR 83 | Ht 64.0 in | Wt 146.0 lb

## 2023-01-28 DIAGNOSIS — Z01818 Encounter for other preprocedural examination: Secondary | ICD-10-CM | POA: Diagnosis present

## 2023-01-28 DIAGNOSIS — Z1589 Genetic susceptibility to other disease: Secondary | ICD-10-CM | POA: Diagnosis not present

## 2023-01-28 DIAGNOSIS — K219 Gastro-esophageal reflux disease without esophagitis: Secondary | ICD-10-CM

## 2023-01-28 DIAGNOSIS — Z9221 Personal history of antineoplastic chemotherapy: Secondary | ICD-10-CM | POA: Insufficient documentation

## 2023-01-28 DIAGNOSIS — Z1211 Encounter for screening for malignant neoplasm of colon: Secondary | ICD-10-CM

## 2023-01-28 HISTORY — DX: Depression, unspecified: F32.A

## 2023-01-28 HISTORY — DX: Gastro-esophageal reflux disease without esophagitis: K21.9

## 2023-01-28 HISTORY — DX: Headache, unspecified: R51.9

## 2023-01-28 HISTORY — DX: Unspecified osteoarthritis, unspecified site: M19.90

## 2023-01-28 LAB — BASIC METABOLIC PANEL
Anion gap: 9 (ref 5–15)
BUN: 14 mg/dL (ref 6–20)
CO2: 24 mmol/L (ref 22–32)
Calcium: 9.3 mg/dL (ref 8.9–10.3)
Chloride: 108 mmol/L (ref 98–111)
Creatinine, Ser: 1.02 mg/dL — ABNORMAL HIGH (ref 0.44–1.00)
GFR, Estimated: >60 mL/min (ref >60)
Glucose, Bld: 121 mg/dL — ABNORMAL HIGH (ref 70–99)
Potassium: 3.9 mmol/L (ref 3.5–5.1)
Sodium: 141 mmol/L (ref 135–145)

## 2023-01-28 LAB — CBC
HCT: 39.3 % (ref 36.0–46.0)
Hemoglobin: 13.4 g/dL (ref 12.0–15.0)
MCH: 33.2 pg (ref 26.0–34.0)
MCHC: 34.1 g/dL (ref 30.0–36.0)
MCV: 97.3 fL (ref 80.0–100.0)
Platelets: 300 10*3/uL (ref 150–400)
RBC: 4.04 MIL/uL (ref 3.87–5.11)
RDW: 12.2 % (ref 11.5–15.5)
WBC: 9.3 10*3/uL (ref 4.0–10.5)
nRBC: 0 % (ref 0.0–0.2)

## 2023-01-28 MED ORDER — COLESTIPOL HCL 1 G PO TABS
2.0000 g | ORAL_TABLET | Freq: Two times a day (BID) | ORAL | 0 refills | Status: DC
  Filled 2023-01-28: qty 120, 30d supply, fill #0

## 2023-01-28 MED ORDER — HYDROCORTISONE (PERIANAL) 2.5 % EX CREA
1.0000 | TOPICAL_CREAM | Freq: Two times a day (BID) | CUTANEOUS | 0 refills | Status: AC
  Filled 2023-01-28: qty 30, 10d supply, fill #0

## 2023-01-28 NOTE — Patient Instructions (Addendum)
It has been recommended to you by your physician that you have a(n) Colonoscopy completed. Per your request, we did not schedule the procedure(s) today. Please contact our office at [phone] should you decide to have the procedure completed. You will be scheduled for a pre-visit and procedure at that time.   Stop taking Imodium 3 days before procedure   If your blood pressure at your visit was 140/90 or greater, please contact your primary care physician to follow up on this.  _______________________________________________________  If you are age 55 or older, your body mass index should be between 23-30. Your Body mass index is 25.06 kg/m. If this is out of the aforementioned range listed, please consider follow up with your Primary Care Provider.  If you are age 46 or younger, your body mass index should be between 19-25. Your Body mass index is 25.06 kg/m. If this is out of the aformentioned range listed, please consider follow up with your Primary Care Provider.   ________________________________________________________  The [facility] providers would like to encourage you to use [facility] to communicate with providers for non-urgent requests or questions.  Due to long hold times on the telephone, sending your provider a message by [facility] may be a faster and more efficient way to get a response.  Please allow 48 business hours for a response.  Please remember that this is for non-urgent requests.  _______________________________________________________    Thank you for entrusting me with your care and for choosing [facility],  Dr. Eulah Pont

## 2023-01-28 NOTE — Progress Notes (Signed)
PCP - Dr. Georgianne Fick Cardiologist - Denies  PPM/ICD - Denies Device Orders - n/a Rep Notified - n/a  Chest x-ray - Denies EKG - 09/12/2021 Stress Test - 12/03/2012 for CP. Result normal ECHO - Denies Cardiac Cath - Denies  Sleep Study - Denies CPAP - n/a  No DM  Last dose of GLP1 agonist- n/a GLP1 instructions: n/a  Blood Thinner Instructions: n/a Aspirin Instructions: n/a  ERAS Protcol - Clear liquids until 1000 morning of surgery PRE-SURGERY Ensure or G2- n/a  COVID TEST- n/a   Anesthesia review: Yes. Breast seed placement 7/16 at 1000   Patient denies shortness of breath, fever, cough and chest pain at PAT appointment. Pt denies any respiratory illness/infection in the last two months.   All instructions explained to the patient, with a verbal understanding of the material. Patient agrees to go over the instructions while at home for a better understanding. Patient also instructed to self quarantine after being tested for COVID-19. The opportunity to ask questions was provided.

## 2023-01-28 NOTE — Progress Notes (Unsigned)
Chief Complaint: Colon cancer screening  HPI : 57 year old female with history of CHEK2 mutation, prior SBO s/p ileocecectomy, CML, and s/p lumpectomy presents to discuss colon cancer screening  Back in 2005 she had terrible abdominal pain and was diagnosed with endometriosis that had infiltrated into his small and large intestine. This led 3 subsequent intestinal surgeries including an ileocecectomy.  After her surgery, she has had issues with diarrhea and has to follow a low residue diet for the most part.  Sometimes she eats too much fiber, which causes more issues with diarrhea.  She takes Imodium daily to help with her diarrhea issues.  He would like to discuss getting a colonoscopy because she was diagnosed with the CHEK2 mutation, which increases her risk for developing colon cancer.  She used to follow with Dr. Matthias Hughs with [facility], but he retired. She has never had a colonoscopy before. She did have a flexible sigmoidoscopy in the past that showed some colon polyps that were removed. Maternal grandmother had colon cancer. Denies blood in the stools. Endorses itching in her anal area. Due to this itching, she will wipe vigorously and notice some bleeding at times.  Denies abdominal pain. Denies weight loss. Denies N&V or dysphagia. Every once in a while, she will have some acid reflux. Endorses chest burning. She will take a Tums PRN.    Wt Readings from Last 3 Encounters:  01/28/23 145 lb 8 oz (66 kg)  01/28/23 146 lb (66.2 kg)  10/30/22 148 lb 6.4 oz (67.3 kg)   Past Medical History:  Diagnosis Date   Anxiety    Arthritis    Cancer (HCC) 2024   Right Breast Cancer   Cancer (HCC) 2023   Left Breast Cancer   Cancer (HCC)    Chronic CML   Depression    Family history of pancreatic cancer 03/20/2021   GERD (gastroesophageal reflux disease)    Headache    Hx   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     Past Surgical History:  Procedure Laterality  Date   ABDOMINAL HYSTERECTOMY  2009   APPENDECTOMY Bilateral 2005   bowel removal  2005   5 inches   BOWEL RESECTION  2005   2 feet   BREAST BIOPSY Right 04/29/2019   APOCRINE METAPLASIA    BREAST LUMPECTOMY WITH RADIOACTIVE SEED LOCALIZATION Left 10/03/2021   Procedure: LEFT BREAST LUMPECTOMY WITH RADIOACTIVE SEED LOCALIZATION;  Surgeon: Chevis Pretty III, MD;  Location: [facility];  Service: General;  Laterality: Left;   FRACTURE SURGERY     Collar Bone Left Plate placed   TONSILLECTOMY     As a child   Family History  Problem Relation Age of Onset   Breast cancer Mother        bilateral; dx 5, 77   Acute myelogenous leukemia Mother        dx late 67s   Pancreatic cancer Father 25   Breast cancer Maternal Aunt        dx after 50   Breast cancer Maternal Aunt        dx after 50   Bladder Cancer Paternal Uncle        dx after 50   Colon cancer Maternal Grandmother 47   Social History   Tobacco Use   Smoking status: Every Day    Current packs/day: 1.00    Average packs/day: 1 pack/day for 17.0 years (17.0 ttl pk-yrs)  Types: Cigarettes   Smokeless tobacco: Never  Vaping Use   Vaping status: Never Used  Substance Use Topics   Alcohol use: Not Currently   Drug use: No   Current Outpatient Medications  Medication Sig Dispense Refill   acetaminophen (TYLENOL) 325 MG tablet Take 325-650 mg by mouth daily as needed (pain).     calcium carbonate (TUMS - DOSED IN MG ELEMENTAL CALCIUM) 500 MG chewable tablet Chew 1 tablet by mouth 2 (two) times daily as needed for heartburn or indigestion.     Calcium Carbonate-Vit D-Min (CALCIUM 1200 PO) Take 1 tablet by mouth in the morning and at bedtime.     cholecalciferol (VITAMIN D3) 25 MCG (1000 UNIT) tablet Take 1,000 Units by mouth in the morning.     clonazePAM (KLONOPIN) 0.5 MG tablet Take 0.5 mg by mouth 2 (two) times daily as needed for anxiety.     colestipol (COLESTID) 1 g tablet Take 2 tablets (2 g total) by  mouth 2 (two) times daily. 120 tablet 0   dasatinib (SPRYCEL) 140 MG tablet Take 1 tablet (140 mg total) by mouth daily. (Patient taking differently: Take 140 mg by mouth at bedtime.) 30 tablet 11   diazepam (VALIUM) 5 MG tablet Take 1 tablet by mouth 1 hour before procedure with very light food. May bring 2nd tablet to appointment. 2 tablet 0   escitalopram (LEXAPRO) 20 MG tablet Take 30 mg by mouth daily.     Eyelid Cleansers (AVENOVA EX) Apply 1 spray topically 2 (two) times daily as needed (eye debris.).     fexofenadine (ALLEGRA) 180 MG tablet Take 180 mg by mouth daily as needed for allergies or rhinitis.     hydrocortisone (ANUSOL-HC) 2.5 % rectal cream Place 1 Application rectally 2 (two) times daily for 7 days. 30 g 0   ibuprofen (ADVIL) 200 MG tablet Take 400-600 mg by mouth every 8 (eight) hours as needed (pain.).     ketoconazole (NIZORAL) 2 % cream Apply Externally twice a day 28 days (Patient taking differently: Apply 1 Application topically See admin instructions. One application up to twice daily for skin irritation (underarms).) 30 g 3   Lactobacillus (PROBIOTIC ACIDOPHILUS PO) Take 1 capsule by mouth in the morning.     loperamide (IMODIUM) 2 MG capsule Take 2 mg by mouth in the morning and at bedtime.     LORazepam (ATIVAN) 1 MG tablet Take 1 tablet (1 mg total) by mouth 2 (two) times daily as needed for anxiety. 10 tablet 0   metaxalone (SKELAXIN) 400 MG tablet Take 1 tablet (400 mg total) by mouth 3 (three) times daily as needed. (Patient taking differently: Take 200-400 mg by mouth 3 (three) times daily as needed.) 30 tablet 0   Multiple Vitamin (MULTIVITAMIN WITH MINERALS) TABS tablet Take 1 tablet by mouth in the morning.     Na Sulfate-K Sulfate-Mg Sulf 17.5-3.13-1.6 GM/177ML SOLN One kit contains 2 bottles.  Take both bottles by mouth at the times instructed by your provider. (Patient not taking: Reported on 01/28/2023) 354 mL 0   No current facility-administered medications  for this visit.   Allergies  Allergen Reactions   Other Other (See Comments)    Can't process certain food like lettuce, sesame seed.   Sulfa Antibiotics Swelling   Wellbutrin [Bupropion] Other (See Comments)    hyper   Zosyn [Piperacillin-Tazobactam In Dex] Rash     Review of Systems: All systems reviewed and negative except where noted in HPI.  Physical Exam: BP 110/64   Pulse 83   Ht 5\' 4"  (1.626 m)   Wt 146 lb (66.2 kg)   LMP 11/18/2012   BMI 25.06 kg/m  Constitutional: Pleasant,well-developed, female in no acute distress. HEENT: Normocephalic and atraumatic. Conjunctivae are normal. No scleral icterus. Cardiovascular: Normal rate, regular rhythm.  Pulmonary/chest: Effort normal and breath sounds normal. No wheezing, rales or rhonchi. Abdominal: Soft, nondistended, nontender. Bowel sounds active throughout. There are no masses palpable. No hepatomegaly. Extremities: No edema Neurological: Alert and oriented to person place and time. Skin: Skin is warm and dry. No rashes noted. Psychiatric: Normal mood and affect. Behavior is normal.  Labs 10/2022: CMP unremarkable  Labs 01/2023: CBC nml. BMP with mildly elevated Cr of 1.02.   Ab U/S 12/19/15: IMPRESSION: 1. No acute hepatobiliary abnormality is observed. No gallstones are demonstrated. If there are clinical concerns of gallbladder dysfunction, a nuclear medicine hepatobiliary scan may be useful. 2. 7 mm nonobstructing mid to lower pole right sided kidney stone. 3. No acute intra-abdominal abnormality is observed.  Barium enema 06/24/18: IMPRESSION: 1. No persistent polypoid lesion or constricting lesion is seen. There is some retained feces present particularly in the right colon making exclusion of small polyps difficult. 2. By history the terminal ileum has been resected, and the anastomosis of distal ileum with right colon is unremarkable with no stricture evident. 3. The colon is elongated and tortuous as  noted above.  RUQ U/S 02/28/21: IMPRESSION: Unremarkable right upper quadrant ultrasound  ASSESSMENT AND PLAN: Colon cancer screening Prior SBO s/p ileocecectomy CHEK2 mutation Rectal itching GERD Patient's presents to discuss getting a colonoscopy for colon cancer screening due to her CHEK2 mutation, which increases her risk for developing colon cancer.  She has never had a colonoscopy in the past due to her prior intestinal resections, including a ileocecectomy.  Will get her set up for a colonoscopy and use a pediatric colonoscope since patient likely has extensive adhesions from her prior abdominal surgeries.  Patient does describe some rectal itching, which may be related to hemorrhoids.  Thus we will give her a trial of topical steroids to see if this helps with her itching.  Patient is also at risk for bile acid diarrhea due to her history of ileocecectomy to me so we will start her on colestipol to see if this helps with her diarrhea issues. - GERD handout - Start colestipol 2 mg QD - Anusol HC cream BID for 7 days - Colonoscopy [facility]. Stop Imodium 3 days before colonoscopy. Will plan to use the pediatric colonoscope.  Eulah Pont, MD

## 2023-01-29 NOTE — Progress Notes (Signed)
Anesthesia Chart Review: [name]  Case: [id number] Date/Time: 02/04/23 1245   Procedure: RIGHT BREAST LUMPECTOMY WITH RADIOACTIVE SEED LOCALIZATION (Right)   Anesthesia type: General   Pre-op diagnosis: RIGHT BREAST COMPLEX SCLEROSING LESION   [facility] / [facility]   Surgeons: Almond Lint, MD       DISCUSSION: Patient is a 57 year old female scheduled for the above procedure.  History includes smoking, CHEK2 mutation, breast complex sclerosing lesion (s/p left breast lumpectomy 10/03/21; right breast 12/18/22 biopsy: CSL), CML (diagnosed 03/01/21, chemotherapy), GERD, anxiety, hysterectomy, bowel resection (exploratory laparotomy with resection of terminal ileum 2005 possible for endometriosis; exploratory laparotomy, resection of terminal ileum and ileocecal anastomosis 04/09/04; exploratory laparotomy, resection of approximately a foot and a half of terminal ileum and anastomosis side to side cecum for infarcted short segment of ileum 06/10/14), hysterectomy (2009).   Radioactive seed @ [facility] 02/03/23 at 10:00 AM. Anesthesia team to evaluate on the day of surgery.    VS: BP 115/72   Pulse 81   Temp 37.2 C (Oral)   Resp 17   Ht 5\' 4"  (1.626 m)   Wt 66 kg   LMP 11/18/2012   SpO2 98%   BMI 24.98 kg/m    PROVIDERS: Georgianne Fick, MD is PCP  Artis Delay, MD is HEM-[facility] Norwood Levo, MD is GI   LABS: Labs reviewed: Acceptable for surgery. AST 22, ALT 16 10/20/22.  (all labs ordered are listed, but only abnormal results are displayed) Labs Reviewed  BASIC METABOLIC PANEL - Abnormal; Notable for the following components:      Result Value   Glucose, Bld 121 (*)    Creatinine, Ser 1.02 (*)    All other components within normal limits  CBC    EKG: 09/12/21: Normal sinus rhythm Possible Left atrial enlargement RSR' or QR pattern in V1 suggests right ventricular conduction delay Abnormal ECG When compared with ECG of 28-Feb-2021 12:19, No significant change was  found Confirmed by Armanda Magic [phone]) on 09/12/2021 5:43:06 PM   CV: ETT 12/03/12: Conclusions Excellent exercise tolerance for age Upsloping ST depression, not significant for ischemia Normal stress test   Past Medical History:  Diagnosis Date   Anxiety    Arthritis    Cancer (HCC) 2024   Right Breast Cancer   Cancer (HCC) 2023   Left Breast Cancer   Cancer (HCC)    Chronic CML   Depression    Family history of pancreatic cancer 03/20/2021   GERD (gastroesophageal reflux disease)    Headache    Hx   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    Past Surgical History:  Procedure Laterality Date   ABDOMINAL HYSTERECTOMY  2009   APPENDECTOMY Bilateral 2005   bowel removal  2005   5 inches   BOWEL RESECTION  2005   2 feet   BREAST BIOPSY Right 04/29/2019   APOCRINE METAPLASIA    BREAST LUMPECTOMY WITH RADIOACTIVE SEED LOCALIZATION Left 10/03/2021   Procedure: LEFT BREAST LUMPECTOMY WITH RADIOACTIVE SEED LOCALIZATION;  Surgeon: Chevis Pretty III, MD;  Location: [facility];  Service: General;  Laterality: Left;   FRACTURE SURGERY     Collar Bone Left Plate placed   TONSILLECTOMY     As a child    MEDICATIONS:  acetaminophen (TYLENOL) 325 MG tablet   calcium carbonate (TUMS - DOSED IN MG ELEMENTAL CALCIUM) 500 MG chewable tablet   Calcium Carbonate-Vit D-Min (CALCIUM 1200 PO)  cholecalciferol (VITAMIN D3) 25 MCG (1000 UNIT) tablet   clonazePAM (KLONOPIN) 0.5 MG tablet   colestipol (COLESTID) 1 g tablet   dasatinib (SPRYCEL) 140 MG tablet   diazepam (VALIUM) 5 MG tablet   escitalopram (LEXAPRO) 20 MG tablet   Eyelid Cleansers (AVENOVA EX)   fexofenadine (ALLEGRA) 180 MG tablet   hydrocortisone (ANUSOL-HC) 2.5 % rectal cream   ibuprofen (ADVIL) 200 MG tablet   ketoconazole (NIZORAL) 2 % cream   Lactobacillus (PROBIOTIC ACIDOPHILUS PO)   loperamide (IMODIUM) 2 MG capsule   LORazepam (ATIVAN) 1 MG tablet   metaxalone  (SKELAXIN) 400 MG tablet   Multiple Vitamin (MULTIVITAMIN WITH MINERALS) TABS tablet   Na Sulfate-K Sulfate-Mg Sulf 17.5-3.13-1.6 GM/177ML SOLN   No current facility-administered medications for this encounter.    Shonna Chock, PA-C Surgical Short Stay/Anesthesiology [facility] Phone [phone] [facility] Phone [phone] 01/29/2023 1:02 PM

## 2023-01-29 NOTE — Anesthesia Preprocedure Evaluation (Addendum)
Anesthesia Evaluation  Patient identified by MRN, date of birth, ID band Patient awake    Reviewed: Allergy & Precautions, NPO status , Patient's Chart, lab work & pertinent test results  Airway Mallampati: II  TM Distance: >3 FB Neck ROM: Full    Dental no notable dental hx.    Pulmonary neg pulmonary ROS, Current Smoker and Patient abstained from smoking.   Pulmonary exam normal        Cardiovascular negative cardio ROS  Rhythm:Regular Rate:Normal     Neuro/Psych  Headaches  Anxiety Depression       GI/Hepatic Neg liver ROS,GERD  Medicated,,  Endo/Other  negative endocrine ROS    Renal/GU negative Renal ROS  negative genitourinary   Musculoskeletal  (+) Arthritis , Osteoarthritis,    Abdominal Normal abdominal exam  (+)   Peds  Hematology negative hematology ROS (+)   Anesthesia Other Findings   Reproductive/Obstetrics                             Anesthesia Physical Anesthesia Plan  ASA: 2  Anesthesia Plan: General   Post-op Pain Management:    Induction: Intravenous  PONV Risk Score and Plan: 2 and Ondansetron, Dexamethasone, Midazolam and Treatment may vary due to age or medical condition  Airway Management Planned: Mask and LMA  Additional Equipment: None  Intra-op Plan:   Post-operative Plan: Extubation in OR  Informed Consent: I have reviewed the patients History and Physical, chart, labs and discussed the procedure including the risks, benefits and alternatives for the proposed anesthesia with the patient or authorized representative who has indicated his/her understanding and acceptance.     Dental advisory given  Plan Discussed with: CRNA  Anesthesia Plan Comments: (PAT note written 01/29/2023 by Shonna Chock, PA-C.  )       Anesthesia Quick Evaluation

## 2023-01-30 ENCOUNTER — Other Ambulatory Visit: Payer: BC Managed Care – PPO

## 2023-02-02 ENCOUNTER — Inpatient Hospital Stay: Payer: BC Managed Care – PPO | Attending: Hematology and Oncology

## 2023-02-02 DIAGNOSIS — Z803 Family history of malignant neoplasm of breast: Secondary | ICD-10-CM | POA: Diagnosis not present

## 2023-02-02 DIAGNOSIS — F17210 Nicotine dependence, cigarettes, uncomplicated: Secondary | ICD-10-CM | POA: Insufficient documentation

## 2023-02-02 DIAGNOSIS — C9210 Chronic myeloid leukemia, BCR/ABL-positive, not having achieved remission: Secondary | ICD-10-CM | POA: Insufficient documentation

## 2023-02-02 DIAGNOSIS — G8929 Other chronic pain: Secondary | ICD-10-CM

## 2023-02-02 LAB — CBC WITH DIFFERENTIAL/PLATELET
Abs Immature Granulocytes: 0.01 10*3/uL (ref 0.00–0.07)
Basophils Absolute: 0.1 10*3/uL (ref 0.0–0.1)
Basophils Relative: 1 %
Eosinophils Absolute: 0.5 10*3/uL (ref 0.0–0.5)
Eosinophils Relative: 8 %
HCT: 39.2 % (ref 36.0–46.0)
Hemoglobin: 13.8 g/dL (ref 12.0–15.0)
Immature Granulocytes: 0 %
Lymphocytes Relative: 32 %
Lymphs Abs: 2.2 10*3/uL (ref 0.7–4.0)
MCH: 33.6 pg (ref 26.0–34.0)
MCHC: 35.2 g/dL (ref 30.0–36.0)
MCV: 95.4 fL (ref 80.0–100.0)
Monocytes Absolute: 0.4 10*3/uL (ref 0.1–1.0)
Monocytes Relative: 6 %
Neutro Abs: 3.8 10*3/uL (ref 1.7–7.7)
Neutrophils Relative %: 53 %
Platelets: 283 10*3/uL (ref 150–400)
RBC: 4.11 MIL/uL (ref 3.87–5.11)
RDW: 12.1 % (ref 11.5–15.5)
WBC: 7.1 10*3/uL (ref 4.0–10.5)
nRBC: 0 % (ref 0.0–0.2)

## 2023-02-02 LAB — COMPREHENSIVE METABOLIC PANEL
ALT: 20 U/L (ref 0–44)
AST: 25 U/L (ref 15–41)
Albumin: 4.3 g/dL (ref 3.5–5.0)
Alkaline Phosphatase: 89 U/L (ref 38–126)
Anion gap: 5 (ref 5–15)
BUN: 15 mg/dL (ref 6–20)
CO2: 26 mmol/L (ref 22–32)
Calcium: 9.4 mg/dL (ref 8.9–10.3)
Chloride: 110 mmol/L (ref 98–111)
Creatinine, Ser: 1.02 mg/dL — ABNORMAL HIGH (ref 0.44–1.00)
GFR, Estimated: >60 mL/min (ref >60)
Glucose, Bld: 91 mg/dL (ref 70–99)
Potassium: 3.9 mmol/L (ref 3.5–5.1)
Sodium: 141 mmol/L (ref 135–145)
Total Bilirubin: 0.4 mg/dL (ref 0.3–1.2)
Total Protein: 6.8 g/dL (ref 6.5–8.1)

## 2023-02-03 ENCOUNTER — Ambulatory Visit
Admission: RE | Admit: 2023-02-03 | Discharge: 2023-02-03 | Disposition: A | Discharge status: Home / Self Care | Payer: BC Managed Care – PPO | Source: Ambulatory Visit | Attending: General Surgery | Admitting: General Surgery

## 2023-02-03 ENCOUNTER — Encounter: Payer: Self-pay | Admitting: General Surgery

## 2023-02-03 ENCOUNTER — Ambulatory Visit: Admission: RE | Admit: 2023-02-03 | Payer: BC Managed Care – PPO | Source: Ambulatory Visit

## 2023-02-03 ENCOUNTER — Other Ambulatory Visit: Payer: Self-pay | Admitting: General Surgery

## 2023-02-03 DIAGNOSIS — N6489 Other specified disorders of breast: Secondary | ICD-10-CM

## 2023-02-03 HISTORY — PX: BREAST BIOPSY: SHX20

## 2023-02-03 NOTE — H&P (Signed)
PROVIDER: Matthias Hughs, MD Patient Care Team: Georgianne Fick, MD as PCP - General (Internal Medicine)  MRN: [id number] DOB: August 26, 1965  Chief Complaint: Right Breast- Complex Sclerosing Lesion   History of Present Illness: [name] is a 57 y.o. female who is seen today for breast biopsy follow up.  Initial history:   Pt is a CHEK-2 carrier who required a left breast lumpectomy last year for a CSL.This was 10/03/21 by Dr. Carolynne Edouard. Path was benign. She had a small cellulitis and I saw her in conjunction with [facility].   Interval history:   She is on the alternating MR/Mammogram pathway and this year her MR showed an 8 mm lesion at 12 o'clock. Core needle biopsy showed another CSL. She is interested in discussing excision.   MR breast bilat 12/08/22   IMPRESSION: 1. There is a new 8 mm mass at 12 o'clock in the right breast at a mid depth with plateau kinetics. 2. The right nipple is deviated laterally. This is likely positional as the finding is new since August 02, 2021 and no nipple inversion or changes were reported. 3. No other suspicious findings.  RECOMMENDATION: 1. Recommend MRI guided biopsy of the 12 o'clock right breast mass. 2. Recommend clinical correlation for the lateral deviation of the right nipple, favored to be positional. 3. No other suspicious findings.  BI-RADS CATEGORY 4: Suspicious.  MR biopsy 12/18/2022 Breast, right, needle core biopsy, 12 o'clock - COMPLEX SCLEROSING LESION WITH ASSOCIATED RARE MICROCALCIFICATIONS - FIBROCYSTIC CHANGES INCLUDING STROMAL FIBROSIS AND MICROCYSTS WITH APOCRINE METAPLASIA - FOCAL SCLEROSING ADENOSIS   Review of Systems: A complete review of systems was obtained from the patient. I have reviewed this information and discussed as appropriate with the patient. See HPI as well for other ROS.  Review of Systems  Skin: Positive for rash.  Endo/Heme/Allergies: Bruises/bleeds easily.  Psychiatric/Behavioral:  Positive for depression.  All other systems reviewed and are negative.   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 Carolynne Edouard  HYSTERECTOMY  repair of deviated septum  repair of fractured clavicle Left  TONSILLECTOMY    Allergies  Allergen Reactions  Bupropion Unknown  Psychosis  Sulfa (Sulfonamide Antibiotics) Swelling  Sulfamethoxazole-Trimethoprim Swelling  Piperacillin-Tazobactam Rash  Bupropion Hcl Unknown   Current Outpatient Medications on File Prior to Visit  Medication Sig Dispense Refill  calcium carbonate (TUMS) 200 mg calcium (500 mg) chewable tablet Take 2 tablets by mouth once daily  cholecalciferol (VITAMIN D3) 1000 unit tablet Take by mouth once daily  clonazePAM (KLONOPIN) 0.5 MG tablet Take 0.5 mg by mouth 2 (two) times daily as needed  dasatinib (SPRYCEL) 70 MG tablet Take by mouth  escitalopram oxalate (LEXAPRO) 20 MG tablet TAKE 1 AND 1/2 TABLET BY MOUTH EVERY DAY  famotidine (PEPCID) 10 MG tablet Take 10 mg by mouth 2 (two) times daily  ibuprofen (MOTRIN) 200 MG tablet Take by mouth  Lactobac no.41/Bifidobact no.7 (PROBIOTIC-10 ORAL)  loperamide (IMODIUM) 2 mg capsule Take by mouth  LORazepam (ATIVAN) 1 MG tablet Take 1  mg by mouth 2 (two) times daily as needed  multivitamin with minerals tablet Take 1 tablet by mouth once daily  ondansetron (ZOFRAN) 4 MG tablet Take 4 mg by mouth every 8 (eight) hours as needed (Patient not taking:  Reported on 01/05/2023)  oxyCODONE (ROXICODONE) 5 MG immediate release tablet Take 5 mg by mouth every 6 (six) hours as needed (Patient not taking: Reported on 01/05/2023)  sodium, potassium, and magnesium (SUPREP) oral solution Take 1 Bottle by mouth as directed One kit contains 2 bottles. Take both bottles at the times instructed by your provider. (Patient not taking: Reported on 01/05/2023) 354 mL 0   No current facility-administered medications on file prior to visit.   Family History  Problem Relation Age of Onset  Breast cancer Mother 99  second primary at age 74  Acute myelogenous leukemia Mother  Pancreatic cancer Father 55  Breast cancer Maternal Aunt 52  Uterine cancer Maternal Aunt 60  Bladder Cancer Paternal Uncle  >50  Colon cancer Maternal Grandmother 38    Social History   Tobacco Use  Smoking Status Every Day  Current packs/day: 1.00  Types: Cigarettes  Passive exposure: Current  Smokeless Tobacco Never    Social History   Socioeconomic History  Marital status: Single  Tobacco Use  Smoking status: Every Day  Current packs/day: 1.00  Types: Cigarettes  Passive exposure: Current  Smokeless tobacco: Never  Vaping Use  Vaping status: Never Used  Substance and Sexual Activity  Alcohol use: Yes  Comment: 1 time per year  Drug use: Never  Sexual activity: Defer   Objective:   Vitals:  Pulse: 83  Temp: 37.1 C (98.7 F)  SpO2: 96%  Weight: 66.6 kg (146 lb 12.8 oz)  Height: 162.6 cm (5\' 4" )  PainSc: 0-No pain   Body mass index is 25.2 kg/m.  Head: Normocephalic and atraumatic.  Eyes: Conjunctivae are normal. Pupils are equal, round, and reactive to light. No scleral icterus.  Neck: Normal range of motion. Neck supple. No tracheal deviation present. No thyromegaly present.  Resp: No respiratory distress, normal effort. Breast:no palpable masses. Breast relatively dense. No LAD. Faint bruising right upper breast. No nipple retraction or nipple  discharge. Nipples slightly asymmetric from prior surgery Abd: Abdomen is soft, non distended and non tender. No masses are palpable. There is no rebound and no guarding.  Neurological: Alert and oriented to person, place, and time. Coordination normal.  Skin: Skin is warm and dry. No rash noted. No diaphoretic. No erythema. No pallor.   Psychiatric: Normal mood and affect. Normal behavior. Judgment and thought content normal.   Labs, Imaging and Diagnostic Testing:  See above.   Assessment and Plan:   Diagnoses and all orders for this visit:  Complex sclerosing lesion of right breast  Monoallelic mutation of CHEK2 gene in female patient   Patient has a new complex sclerosing lesion on the right breast. We will plan to excise this. She is considering bilateral mastectomies for her Chek 2 mutation. However, she is not a position that she can do this for while given the prolonged recovery. She is also still considering whether to have any reconstruction or not. She does not want to wait that long to make sure that this is not a malignant lesion. This is not unreasonable.  The patient had the surgery last year, but I reviewed the procedure again along with risks and benefits. I reviewed the risk of bleeding, infection, fluid buildup, possible need for additional surgery,  possible heart or lung complications or blood clots. She wishes to proceed.

## 2023-02-04 ENCOUNTER — Ambulatory Visit
Admission: RE | Admit: 2023-02-04 | Discharge: 2023-02-04 | Disposition: A | Discharge status: Home / Self Care | Payer: BC Managed Care – PPO | Source: Ambulatory Visit | Attending: General Surgery | Admitting: General Surgery

## 2023-02-04 ENCOUNTER — Ambulatory Visit (HOSPITAL_COMMUNITY): Payer: BC Managed Care – PPO

## 2023-02-04 ENCOUNTER — Other Ambulatory Visit: Payer: Self-pay

## 2023-02-04 ENCOUNTER — Other Ambulatory Visit (HOSPITAL_COMMUNITY): Payer: Self-pay

## 2023-02-04 ENCOUNTER — Ambulatory Visit (HOSPITAL_COMMUNITY)
Admission: RE | Admit: 2023-02-04 | Discharge: 2023-02-04 | Disposition: A | Discharge status: Home / Self Care | Payer: BC Managed Care – PPO | Attending: General Surgery | Admitting: General Surgery

## 2023-02-04 ENCOUNTER — Encounter (HOSPITAL_COMMUNITY): Payer: Self-pay | Admitting: General Surgery

## 2023-02-04 ENCOUNTER — Encounter (HOSPITAL_COMMUNITY)
Admission: RE | Disposition: A | Discharge status: Home / Self Care | Payer: Self-pay | Source: Home / Self Care | Attending: General Surgery

## 2023-02-04 ENCOUNTER — Ambulatory Visit (HOSPITAL_COMMUNITY): Payer: BC Managed Care – PPO | Admitting: Physician Assistant

## 2023-02-04 DIAGNOSIS — F17210 Nicotine dependence, cigarettes, uncomplicated: Secondary | ICD-10-CM | POA: Diagnosis not present

## 2023-02-04 DIAGNOSIS — F32A Depression, unspecified: Secondary | ICD-10-CM | POA: Diagnosis not present

## 2023-02-04 DIAGNOSIS — N6081 Other benign mammary dysplasias of right breast: Secondary | ICD-10-CM | POA: Insufficient documentation

## 2023-02-04 DIAGNOSIS — F419 Anxiety disorder, unspecified: Secondary | ICD-10-CM | POA: Diagnosis not present

## 2023-02-04 DIAGNOSIS — N6489 Other specified disorders of breast: Secondary | ICD-10-CM | POA: Insufficient documentation

## 2023-02-04 DIAGNOSIS — Z1501 Genetic susceptibility to malignant neoplasm of breast: Secondary | ICD-10-CM | POA: Diagnosis not present

## 2023-02-04 DIAGNOSIS — N6021 Fibroadenosis of right breast: Secondary | ICD-10-CM | POA: Diagnosis not present

## 2023-02-04 DIAGNOSIS — K219 Gastro-esophageal reflux disease without esophagitis: Secondary | ICD-10-CM | POA: Diagnosis not present

## 2023-02-04 HISTORY — PX: BREAST LUMPECTOMY WITH RADIOACTIVE SEED LOCALIZATION: SHX6424

## 2023-02-04 SURGERY — BREAST LUMPECTOMY WITH RADIOACTIVE SEED LOCALIZATION
Anesthesia: General | Site: Breast | Laterality: Right

## 2023-02-04 MED ORDER — ORAL CARE MOUTH RINSE: 15.0000 mL | Freq: Once | OROMUCOSAL | Status: AC

## 2023-02-04 MED ORDER — DEXAMETHASONE SODIUM PHOSPHATE 10 MG/ML IJ SOLN
INTRAMUSCULAR | Status: DC | PRN
  Administered 2023-02-04: 10 mg via INTRAVENOUS

## 2023-02-04 MED ORDER — OXYCODONE HCL 5 MG PO TABS
5.0000 mg | ORAL_TABLET | Freq: Four times a day (QID) | ORAL | 0 refills | Status: DC | PRN
  Filled 2023-02-04: qty 10, 3d supply, fill #0

## 2023-02-04 MED ORDER — ONDANSETRON HCL 4 MG/2ML IJ SOLN
INTRAMUSCULAR | Status: AC
  Filled 2023-02-04: qty 2

## 2023-02-04 MED ORDER — ACETAMINOPHEN 500 MG PO TABS
1000.0000 mg | ORAL_TABLET | ORAL | Status: AC
  Administered 2023-02-04: 1000 mg via ORAL
  Filled 2023-02-04: qty 2

## 2023-02-04 MED ORDER — CHLORHEXIDINE GLUCONATE 0.12 % MT SOLN
15.0000 mL | Freq: Once | OROMUCOSAL | Status: AC
  Administered 2023-02-04: 15 mL via OROMUCOSAL
  Filled 2023-02-04: qty 15

## 2023-02-04 MED ORDER — LIDOCAINE HCL 1 % IJ SOLN
INTRAMUSCULAR | Status: DC | PRN
  Administered 2023-02-04: 30 mL via INTRAMUSCULAR

## 2023-02-04 MED ORDER — LIDOCAINE 2% (20 MG/ML) 5 ML SYRINGE
INTRAMUSCULAR | Status: DC | PRN
  Administered 2023-02-04: 60 mg via INTRAVENOUS

## 2023-02-04 MED ORDER — FENTANYL CITRATE (PF) 250 MCG/5ML IJ SOLN
INTRAMUSCULAR | Status: AC
  Filled 2023-02-04: qty 5

## 2023-02-04 MED ORDER — CHLORHEXIDINE GLUCONATE CLOTH 2 % EX PADS: 6.0000 | MEDICATED_PAD | Freq: Once | CUTANEOUS | Status: DC

## 2023-02-04 MED ORDER — LIDOCAINE 2% (20 MG/ML) 5 ML SYRINGE
INTRAMUSCULAR | Status: AC
  Filled 2023-02-04: qty 5

## 2023-02-04 MED ORDER — LIDOCAINE HCL 1 % IJ SOLN
INTRAMUSCULAR | Status: AC
  Filled 2023-02-04: qty 20

## 2023-02-04 MED ORDER — 0.9 % SODIUM CHLORIDE (POUR BTL) OPTIME
TOPICAL | Status: DC | PRN
  Administered 2023-02-04: 1000 mL

## 2023-02-04 MED ORDER — FENTANYL CITRATE (PF) 250 MCG/5ML IJ SOLN
INTRAMUSCULAR | Status: DC | PRN
  Administered 2023-02-04 (×2): 50 ug via INTRAVENOUS

## 2023-02-04 MED ORDER — MIDAZOLAM HCL 2 MG/2ML IJ SOLN
INTRAMUSCULAR | Status: DC | PRN
  Administered 2023-02-04: 2 mg via INTRAVENOUS

## 2023-02-04 MED ORDER — PHENYLEPHRINE 80 MCG/ML (10ML) SYRINGE FOR IV PUSH (FOR BLOOD PRESSURE SUPPORT)
PREFILLED_SYRINGE | INTRAVENOUS | Status: DC | PRN
  Administered 2023-02-04 (×2): 80 ug via INTRAVENOUS

## 2023-02-04 MED ORDER — BUPIVACAINE-EPINEPHRINE (PF) 0.25% -1:200000 IJ SOLN
INTRAMUSCULAR | Status: AC
  Filled 2023-02-04: qty 30

## 2023-02-04 MED ORDER — PROPOFOL 10 MG/ML IV BOLUS
INTRAVENOUS | Status: DC | PRN
  Administered 2023-02-04: 145 mg via INTRAVENOUS

## 2023-02-04 MED ORDER — PROPOFOL 10 MG/ML IV BOLUS
INTRAVENOUS | Status: AC
  Filled 2023-02-04: qty 20

## 2023-02-04 MED ORDER — ONDANSETRON HCL 4 MG/2ML IJ SOLN
INTRAMUSCULAR | Status: DC | PRN
  Administered 2023-02-04: 4 mg via INTRAVENOUS

## 2023-02-04 MED ORDER — MIDAZOLAM HCL 2 MG/2ML IJ SOLN
INTRAMUSCULAR | Status: AC
  Filled 2023-02-04: qty 2

## 2023-02-04 MED ORDER — CIPROFLOXACIN IN D5W 400 MG/200ML IV SOLN
400.0000 mg | INTRAVENOUS | Status: AC
  Administered 2023-02-04: 400 mg via INTRAVENOUS
  Filled 2023-02-04: qty 200

## 2023-02-04 MED ORDER — LACTATED RINGERS IV SOLN: INTRAVENOUS | Status: DC

## 2023-02-04 MED ORDER — FENTANYL CITRATE (PF) 100 MCG/2ML IJ SOLN: 25.0000 ug | INTRAMUSCULAR | Status: DC | PRN

## 2023-02-04 MED ORDER — DEXAMETHASONE SODIUM PHOSPHATE 10 MG/ML IJ SOLN
INTRAMUSCULAR | Status: AC
  Filled 2023-02-04: qty 1

## 2023-02-04 SURGICAL SUPPLY — 42 items
ADH SKN CLS APL DERMABOND .7 (GAUZE/BANDAGES/DRESSINGS) ×1
APL PRP STRL LF DISP 70% ISPRP (MISCELLANEOUS) ×1
BAG COUNTER SPONGE SURGICOUNT (BAG) ×2 IMPLANT
BAG SPNG CNTER NS LX DISP (BAG)
BINDER BREAST LRG (GAUZE/BANDAGES/DRESSINGS) IMPLANT
BINDER BREAST XLRG (GAUZE/BANDAGES/DRESSINGS) IMPLANT
CANISTER SUCT 3000ML PPV (MISCELLANEOUS) IMPLANT
CHLORAPREP W/TINT 26 (MISCELLANEOUS) ×2 IMPLANT
CLIP TI LARGE 6 (CLIP) ×2 IMPLANT
COVER PROBE W GEL 5X96 (DRAPES) ×2 IMPLANT
COVER SURGICAL LIGHT HANDLE (MISCELLANEOUS) ×2 IMPLANT
DERMABOND ADVANCED .7 DNX12 (GAUZE/BANDAGES/DRESSINGS) ×2 IMPLANT
DEVICE DUBIN SPECIMEN MAMMOGRA (MISCELLANEOUS) ×2 IMPLANT
DRAPE CHEST BREAST 15X10 FENES (DRAPES) ×2 IMPLANT
ELECT COATED BLADE 2.86 ST (ELECTRODE) ×2 IMPLANT
ELECT REM PT RETURN 9FT ADLT (ELECTROSURGICAL) ×1
ELECTRODE REM PT RTRN 9FT ADLT (ELECTROSURGICAL) ×2 IMPLANT
GAUZE PAD ABD 8X10 STRL (GAUZE/BANDAGES/DRESSINGS) ×2 IMPLANT
GAUZE SPONGE 4X4 12PLY STRL LF (GAUZE/BANDAGES/DRESSINGS) ×2 IMPLANT
GLOVE BIO SURGEON STRL SZ 6 (GLOVE) ×2 IMPLANT
GLOVE INDICATOR 6.5 STRL GRN (GLOVE) ×2 IMPLANT
GOWN STRL REUS W/ TWL LRG LVL3 (GOWN DISPOSABLE) ×2 IMPLANT
GOWN STRL REUS W/ TWL XL LVL3 (GOWN DISPOSABLE) ×2 IMPLANT
GOWN STRL REUS W/TWL LRG LVL3 (GOWN DISPOSABLE) ×2
GOWN STRL REUS W/TWL XL LVL3 (GOWN DISPOSABLE) ×1
KIT BASIN OR (CUSTOM PROCEDURE TRAY) ×2 IMPLANT
KIT MARKER MARGIN INK (KITS) ×2 IMPLANT
LIGHT WAVEGUIDE WIDE FLAT (MISCELLANEOUS) IMPLANT
NDL HYPO 25GX1X1/2 BEV (NEEDLE) ×2 IMPLANT
NEEDLE HYPO 25GX1X1/2 BEV (NEEDLE) ×1 IMPLANT
NS IRRIG 1000ML POUR BTL (IV SOLUTION) IMPLANT
PACK GENERAL/GYN (CUSTOM PROCEDURE TRAY) ×2 IMPLANT
STRIP CLOSURE SKIN 1/2X4 (GAUZE/BANDAGES/DRESSINGS) ×2 IMPLANT
SUT MNCRL AB 4-0 PS2 18 (SUTURE) ×2 IMPLANT
SUT SILK 2 0 SH (SUTURE) IMPLANT
SUT VIC AB 2-0 SH 27 (SUTURE)
SUT VIC AB 2-0 SH 27XBRD (SUTURE) IMPLANT
SUT VIC AB 3-0 SH 27 (SUTURE) ×1
SUT VIC AB 3-0 SH 27X BRD (SUTURE) ×2 IMPLANT
SYR CONTROL 10ML LL (SYRINGE) ×2 IMPLANT
TOWEL GREEN STERILE (TOWEL DISPOSABLE) ×2 IMPLANT
TOWEL GREEN STERILE FF (TOWEL DISPOSABLE) ×2 IMPLANT

## 2023-02-04 NOTE — Transfer of Care (Signed)
Immediate Anesthesia Transfer of Care Note  Patient: [name]  Procedure(s) Performed: RIGHT BREAST LUMPECTOMY WITH RADIOACTIVE SEED LOCALIZATION (Right)  Patient Location: PACU  Anesthesia Type:General  Level of Consciousness: awake, alert , and drowsy  Airway & Oxygen Therapy: Patient Spontanous Breathing and Patient connected to face mask oxygen  Post-op Assessment: Report given to RN, Post -op Vital signs reviewed and stable, and Patient moving all extremities X 4  Post vital signs: Reviewed and stable  Last Vitals:  Vitals Value Taken Time  BP 118/75 02/04/23 1500  Temp 36.6 C 02/04/23 1455  Pulse 69 02/04/23 1507  Resp 13 02/04/23 1507  SpO2 94 % 02/04/23 1507  Vitals shown include unfiled device data.  Last Pain:  Vitals:   02/04/23 1455  TempSrc:   PainSc: Asleep      Patients Stated Pain Goal: 1 (02/04/23 1124)  Complications: There were no known notable events for this encounter.

## 2023-02-04 NOTE — Interval H&P Note (Signed)
History and Physical Interval Note:  02/04/2023 1:21 PM  [name]  has presented today for surgery, with the diagnosis of RIGHT BREAST COMPLEX SCLEROSING LESION.  The various methods of treatment have been discussed with the patient and family. After consideration of risks, benefits and other options for treatment, the patient has consented to  Procedure(s): RIGHT BREAST LUMPECTOMY WITH RADIOACTIVE SEED LOCALIZATION (Right) as a surgical intervention.  The patient's history has been reviewed, patient examined, no change in status, stable for surgery.  I have reviewed the patient's chart and labs.  Questions were answered to the patient's satisfaction.     Almond Lint

## 2023-02-04 NOTE — Op Note (Signed)
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 The risks, benefits, complications, treatment options, and expected outcomes were discussed with the patient. The possibilities of bleeding, infection, the need for additional procedures, failure to diagnose a condition, and creating a complication requiring other procedures or operations were discussed with the patient. The patient concurred with the proposed plan, giving informed consent.  The site of surgery properly noted/marked. The patient was taken to Operating Room # 2, identified, and the procedure verified as right breast seed localized lumpectomy.  The right breast and chest were prepped and draped in standard fashion. A superior circumareolar incision was made near the previously placed radioactive seed.  Dissection was carried down around the point of maximum signal intensity. The cautery was used to perform the dissection.   The specimen was inked with the margin marker paint kit.    Specimen radiography confirmed inclusion of the mammographic lesion, the clip, and the seed.  The background signal in the breast was zero.  Hemostasis was achieved with cautery.  The cavity was marked with clips on each border other than the anterior border.  The wound was irrigated and closed with 3-0 vicryl interrupted deep dermal sutures and 4-0 monocryl running subcuticular suture.      Sterile dressings were applied. At the end of the operation, all sponge, instrument, and needle counts were correct.   Findings: Seed, clip in specimen.  anterior margin is skin, posterior margin is pectoralis   Estimated Blood Loss:  min          Specimens: right breast tissue with seed         Complications:  None; patient tolerated the procedure well.         Disposition: PACU - hemodynamically stable.         Condition: stable

## 2023-02-04 NOTE — Anesthesia Postprocedure Evaluation (Signed)
Anesthesia Post Note  Patient: [name]  Procedure(s) Performed: RIGHT BREAST LUMPECTOMY WITH RADIOACTIVE SEED LOCALIZATION (Right: Breast)     Patient location during evaluation: PACU Anesthesia Type: General Level of consciousness: awake and alert Pain management: pain level controlled Vital Signs Assessment: post-procedure vital signs reviewed and stable Respiratory status: spontaneous breathing, nonlabored ventilation, respiratory function stable and patient connected to nasal cannula oxygen Cardiovascular status: blood pressure returned to baseline and stable Postop Assessment: no apparent nausea or vomiting Anesthetic complications: no   There were no known notable events for this encounter.  Last Vitals:  Vitals:   02/04/23 1510 02/04/23 1525  BP: 120/66   Pulse: 70 65  Resp: 13 12  Temp:  36.6 C  SpO2: 93% 95%    Last Pain:  Vitals:   02/04/23 1455  TempSrc:   PainSc: Asleep                 Earl Lites P Eila Runyan

## 2023-02-04 NOTE — Discharge Instructions (Addendum)

## 2023-02-04 NOTE — Anesthesia Procedure Notes (Addendum)
Procedure Name: LMA Insertion Date/Time: 02/04/2023 2:03 PM  Performed by: Aundria Rud, CRNAPre-anesthesia Checklist: Patient identified, Emergency Drugs available, Suction available, Patient being monitored and Timeout performed Patient Re-evaluated:Patient Re-evaluated prior to induction Oxygen Delivery Method: Circle system utilized Preoxygenation: Pre-oxygenation with 100% oxygen Induction Type: IV induction Ventilation: Mask ventilation without difficulty LMA: LMA inserted LMA Size: 3.0 Number of attempts: 1 Placement Confirmation: positive ETCO2, CO2 detector and breath sounds checked- equal and bilateral Tube secured with: Tape Dental Injury: Teeth and Oropharynx as per pre-operative assessment

## 2023-02-05 ENCOUNTER — Encounter (HOSPITAL_COMMUNITY): Payer: Self-pay | Admitting: General Surgery

## 2023-02-06 ENCOUNTER — Other Ambulatory Visit (HOSPITAL_COMMUNITY): Payer: Self-pay

## 2023-02-06 ENCOUNTER — Encounter (HOSPITAL_COMMUNITY): Payer: Self-pay

## 2023-02-06 LAB — SURGICAL PATHOLOGY

## 2023-02-06 MED ORDER — CEPHALEXIN 500 MG PO CAPS
500.0000 mg | ORAL_CAPSULE | Freq: Three times a day (TID) | ORAL | 0 refills | Status: DC
  Filled 2023-02-06: qty 21, 7d supply, fill #0

## 2023-02-09 LAB — BCR/ABL

## 2023-02-10 ENCOUNTER — Ambulatory Visit: Payer: BC Managed Care – PPO | Admitting: Hematology and Oncology

## 2023-02-11 ENCOUNTER — Other Ambulatory Visit: Payer: Self-pay

## 2023-02-11 ENCOUNTER — Telehealth: Payer: Self-pay

## 2023-02-11 ENCOUNTER — Encounter: Payer: Self-pay | Admitting: Internal Medicine

## 2023-02-11 ENCOUNTER — Ambulatory Visit (AMBULATORY_SURGERY_CENTER): Payer: BC Managed Care – PPO

## 2023-02-11 VITALS — Ht 64.0 in | Wt 145.0 lb

## 2023-02-11 DIAGNOSIS — Z1211 Encounter for screening for malignant neoplasm of colon: Secondary | ICD-10-CM

## 2023-02-11 MED ORDER — NA SULFATE-K SULFATE-MG SULF 17.5-3.13-1.6 GM/177ML PO SOLN
1.0000 | Freq: Once | ORAL | 0 refills | Status: AC
Start: 2023-02-11 — End: 2023-02-11

## 2023-02-11 NOTE — Progress Notes (Signed)
Denies allergies to eggs or soy products. Denies complication of anesthesia or sedation. Denies use of weight loss medication. Denies use of O2.    Note sent to Dr. Leonides Schanz and Cathlyn Parsons. During PV the patient states that she has a tendency to bleed heavily. When she had a biopsy the physician kept asking her if she was on a blood thinning medication. Patient states she just bleeds heavy and is slow to clot.

## 2023-02-11 NOTE — Telephone Encounter (Signed)
Dr. Leonides Schanz and Coralyn Helling.    I did this patients Pre-Visit today when asked if she had any blood clotting disorders she told me that she is a very heavy bleeder and it takes time for her to clot. When she had a biopsy the physician kept asking if she was on a blood thinning medication. Patient states that she has never taken a blood thinner and all of her life if she cut herself she has always had the same issue. Please confirm that it is still ok to have procedure here in the [facility]. Thanks !   Janalee Dane, LPN PV

## 2023-02-17 ENCOUNTER — Inpatient Hospital Stay (HOSPITAL_BASED_OUTPATIENT_CLINIC_OR_DEPARTMENT_OTHER): Payer: BC Managed Care – PPO | Admitting: Hematology and Oncology

## 2023-02-17 ENCOUNTER — Encounter: Payer: Self-pay | Admitting: Hematology and Oncology

## 2023-02-17 VITALS — BP 120/75 | HR 77 | Temp 99.2°F | Resp 18 | Ht 64.0 in | Wt 149.2 lb

## 2023-02-17 DIAGNOSIS — C9210 Chronic myeloid leukemia, BCR/ABL-positive, not having achieved remission: Secondary | ICD-10-CM

## 2023-02-17 DIAGNOSIS — Z803 Family history of malignant neoplasm of breast: Secondary | ICD-10-CM

## 2023-02-17 DIAGNOSIS — F17210 Nicotine dependence, cigarettes, uncomplicated: Secondary | ICD-10-CM

## 2023-02-17 NOTE — Progress Notes (Signed)
[facility] OFFICE PROGRESS NOTE  Patient Care Team: Georgianne Fick, MD as PCP - General (Internal Medicine)  ASSESSMENT & PLAN:  CML (chronic myeloid leukemia) (HCC) We discussed test results The patient is currently in remission with major molecular response She will continue current dose of Dasatinib For her upcoming breast surgery, advised the patient to stop Dasatinib for 7 days prior to surgery and resume the day after I will continue to see her every 3 months for follow-up  Continuous dependence on cigarette smoking We discussed the importance of nicotine cessation She will try her best to quit smoking  Family history of breast cancer She had recent biopsy that came back benign The patient has finally made informed decision to proceed with bilateral mastectomy without reconstruction surgery I support the patient's decision and we will see her back in 3 months for further follow-up  No orders of the defined types were placed in this encounter.   All questions were answered. The patient knows to call the clinic with any problems, questions or concerns. The total time spent in the appointment was 30 minutes encounter with patients including review of chart and various tests results, discussions about plan of care and coordination of care plan   Artis Delay, MD 02/17/2023 3:15 PM  INTERVAL HISTORY: Please see below for problem oriented charting. she returns for treatment follow-up on Dasatinib for CML She recently underwent repeat biopsy that came back nonmalignant The patient has made informed decision to proceed with bilateral mastectomy in the future Unfortunately, she is still smoking  REVIEW OF SYSTEMS:   Constitutional: Denies fevers, chills or abnormal weight loss Eyes: Denies blurriness of vision Ears, nose, mouth, throat, and face: Denies mucositis or sore throat Respiratory: Denies cough, dyspnea or wheezes Cardiovascular: Denies palpitation,  chest discomfort or lower extremity swelling Gastrointestinal:  Denies nausea, heartburn or change in bowel habits Skin: Denies abnormal skin rashes Lymphatics: Denies new lymphadenopathy or easy bruising Neurological:Denies numbness, tingling or new weaknesses Behavioral/Psych: Mood is stable, no new changes  All other systems were reviewed with the patient and are negative.  I have reviewed the past medical history, past surgical history, social history and family history with the patient and they are unchanged from previous note.  ALLERGIES:  is allergic to other, sulfa antibiotics, wellbutrin [bupropion], and zosyn [piperacillin-tazobactam in dex].  MEDICATIONS:  Current Outpatient Medications  Medication Sig Dispense Refill   acetaminophen (TYLENOL) 325 MG tablet Take 325-650 mg by mouth daily as needed (pain).     calcium carbonate (TUMS - DOSED IN MG ELEMENTAL CALCIUM) 500 MG chewable tablet Chew 1 tablet by mouth 2 (two) times daily as needed for heartburn or indigestion.     Calcium Carbonate-Vit D-Min (CALCIUM 1200 PO) Take 1 tablet by mouth in the morning and at bedtime.     cholecalciferol (VITAMIN D3) 25 MCG (1000 UNIT) tablet Take 1,000 Units by mouth in the morning.     clonazePAM (KLONOPIN) 0.5 MG tablet Take 0.5 mg by mouth 2 (two) times daily as needed for anxiety.     colestipol (COLESTID) 1 g tablet Take 2 tablets (2 g total) by mouth 2 (two) times daily. (Patient not taking: Reported on 02/11/2023) 120 tablet 0   dasatinib (SPRYCEL) 140 MG tablet Take 1 tablet (140 mg total) by mouth daily. (Patient taking differently: Take 140 mg by mouth at bedtime.) 30 tablet 11   diazepam (VALIUM) 5 MG tablet Take 1 tablet by mouth 1 hour before procedure  with very light food. May bring 2nd tablet to appointment. 2 tablet 0   escitalopram (LEXAPRO) 20 MG tablet Take 30 mg by mouth daily.     Eyelid Cleansers (AVENOVA EX) Apply 1 spray topically 2 (two) times daily as needed (eye  debris.).     famotidine (PEPCID) 20 MG tablet Take 20 mg by mouth 2 (two) times daily.     fexofenadine (ALLEGRA) 180 MG tablet Take 180 mg by mouth daily as needed for allergies or rhinitis.     ibuprofen (ADVIL) 200 MG tablet Take 400-600 mg by mouth every 8 (eight) hours as needed (pain.).     ketoconazole (NIZORAL) 2 % cream Apply Externally twice a day 28 days (Patient taking differently: Apply 1 Application topically See admin instructions. One application up to twice daily for skin irritation (underarms).) 30 g 3   Lactobacillus (PROBIOTIC ACIDOPHILUS PO) Take 1 capsule by mouth in the morning.     loperamide (IMODIUM) 2 MG capsule Take 2 mg by mouth in the morning and at bedtime.     LORazepam (ATIVAN) 1 MG tablet Take 1 tablet (1 mg total) by mouth 2 (two) times daily as needed for anxiety. 10 tablet 0   metaxalone (SKELAXIN) 400 MG tablet Take 1 tablet (400 mg total) by mouth 3 (three) times daily as needed. (Patient taking differently: Take 200-400 mg by mouth 3 (three) times daily as needed.) 30 tablet 0   Multiple Vitamin (MULTIVITAMIN WITH MINERALS) TABS tablet Take 1 tablet by mouth in the morning.     oxyCODONE (OXY IR/ROXICODONE) 5 MG immediate release tablet Take 1 tablet (5 mg total) by mouth every 6 (six) hours as needed for severe pain. 10 tablet 0   No current facility-administered medications for this visit.    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HCC)  03/01/2021 Initial Diagnosis   CML (chronic myeloid leukemia) (HCC)   03/01/2021 Pathology Results   BCR/ABL by FISH: 91% positive BRC/ABL by PCR: e13a2 (b2a2) by IS is 196.685%   04/25/2021 -  Chemotherapy   She started taking imatinib   05/15/2021 Pathology Results   BRC/ABL is detected by PCR: e13a2 (b2a2) by IS is 70.731%   06/17/2021 Pathology Results   e13a2 (b2a2) by Quantidex (IS): 39.7401%   07/24/2021 Pathology Results   e13a2 (b2a2) by Quantidex (IS): 33.3%    08/05/2021 -  Chemotherapy   She started taking Sprycel    08/30/2021 Pathology Results   e13a2 (b2a2) by Quantidex (IS): 9.8004%   11/18/2021 Pathology Results   e13a2 (b2a2) by Quantidex (IS): 0.8987%   12/17/2021 Pathology Results   e13a2 (b2a2) by Quantidex (IS): 0.4764%   02/24/2022 Pathology Results   e13a2 (b2a2) by Quantidex (IS): 0.2635%   04/18/2022 Pathology Results   e13a2 (b2a2) by Quantidex (IS): 0.2739%    05/27/2022 Pathology Results   e13a2 (b2a2) by Quantidex (IS): 0.3033     07/16/2022 Pathology Results   e13a2 (b2a2) by Quantidex (IS): 0.1576%   10/20/2022 Pathology Results   e13a2 (b2a2) by Quantidex (IS): 0.1384%   02/02/2023 Pathology Results   13a2 (b2a2) by Quantidex (IS): 0.0313%      PHYSICAL EXAMINATION: ECOG PERFORMANCE STATUS: 1 - Symptomatic but completely ambulatory  Vitals:   02/17/23 1410  BP: 120/75  Pulse: 77  Resp: 18  Temp: 99.2 F (37.3 C)  SpO2: 100%   Filed Weights   02/17/23 1410  Weight: 149 lb 3.2 oz (67.7 kg)  GENERAL:alert, no distress and comfortable NEURO: alert & oriented x 3 with fluent speech, no focal motor/sensory deficits  LABORATORY DATA:  I have reviewed the data as listed    Component Value Date/Time   NA 141 02/02/2023 1303   K 3.9 02/02/2023 1303   CL 110 02/02/2023 1303   CO2 26 02/02/2023 1303   GLUCOSE 91 02/02/2023 1303   BUN 15 02/02/2023 1303   CREATININE 1.02 (H) 02/02/2023 1303   CREATININE 0.95 07/18/2022 1206   CALCIUM 9.4 02/02/2023 1303   PROT 6.8 02/02/2023 1303   ALBUMIN 4.3 02/02/2023 1303   AST 25 02/02/2023 1303   AST 31 07/18/2022 1206   ALT 20 02/02/2023 1303   ALT 33 07/18/2022 1206   ALKPHOS 89 02/02/2023 1303   BILITOT 0.4 02/02/2023 1303   BILITOT 0.4 07/18/2022 1206   GFRNONAA >60 02/02/2023 1303   GFRNONAA >60 07/18/2022 1206   GFRAA >60 10/21/2019 1020    No results found for: "SPEP", "UPEP"  Lab Results  Component Value Date   WBC 7.1 02/02/2023    NEUTROABS 3.8 02/02/2023   HGB 13.8 02/02/2023   HCT 39.2 02/02/2023   MCV 95.4 02/02/2023   PLT 283 02/02/2023      Chemistry      Component Value Date/Time   NA 141 02/02/2023 1303   K 3.9 02/02/2023 1303   CL 110 02/02/2023 1303   CO2 26 02/02/2023 1303   BUN 15 02/02/2023 1303   CREATININE 1.02 (H) 02/02/2023 1303   CREATININE 0.95 07/18/2022 1206      Component Value Date/Time   CALCIUM 9.4 02/02/2023 1303   ALKPHOS 89 02/02/2023 1303   AST 25 02/02/2023 1303   AST 31 07/18/2022 1206   ALT 20 02/02/2023 1303   ALT 33 07/18/2022 1206   BILITOT 0.4 02/02/2023 1303   BILITOT 0.4 07/18/2022 1206       RADIOGRAPHIC STUDIES: I have personally reviewed the radiological images as listed and agreed with the findings in the report. MM Breast Surgical Specimen  Result Date: 02/04/2023 CLINICAL DATA:  Status post seed localized lumpectomy of the RIGHT breast. EXAM: SPECIMEN RADIOGRAPH OF THE RIGHT BREAST COMPARISON:  Previous exam(s). FINDINGS: Status post excision of the right breast. The radioactive seed and barbell biopsy marker clip are present, completely intact, and were marked for pathology. Findings are discussed with the operating room nurse at the time of interpretation. IMPRESSION: Specimen radiograph of the right breast. Electronically Signed   By: Norva Pavlov M.D.   On: 02/04/2023 14:38   MM RT RADIOACTIVE SEED LOC MAMMO GUIDE  Result Date: 02/03/2023 CLINICAL DATA:  Patient presents for seed localization prior to lumpectomy of the RIGHT breast for complex sclerosing lesion. EXAM: MAMMOGRAPHIC GUIDED RADIOACTIVE SEED LOCALIZATION OF THE RIGHT BREAST COMPARISON:  Previous exam(s). FINDINGS: Patient presents for radioactive seed localization prior to lumpectomy. I met with the patient and we discussed the procedure of seed localization including benefits and alternatives. We discussed the high likelihood of a successful procedure. We discussed the risks of the  procedure including infection, bleeding, tissue injury and further surgery. We discussed the low dose of radioactivity involved in the procedure. Informed, written consent was given. The usual time-out protocol was performed immediately prior to the procedure. Using mammographic guidance, sterile technique, 1% lidocaine and an I-125 radioactive seed, the barbell clip in the UPPER central RIGHT breast was localized using a MEDIAL approach. The follow-up mammogram images confirm the seed in the expected location  and were marked for Dr. Donell Beers. Follow-up survey of the patient confirms presence of the radioactive seed. Order number of I-125 seed:  [id number]. Total activity:  0.260 millicurie reference Date: 01/02/2023 The patient tolerated the procedure well and was [facility]. She was given instructions regarding seed removal. IMPRESSION: Radioactive seed localization RIGHT breast. No apparent complications. Electronically Signed   By: Norva Pavlov M.D.   On: 02/03/2023 11:32   MM 3D DIAGNOSTIC MAMMOGRAM BILATERAL BREAST  Result Date: 02/03/2023 CLINICAL DATA:  History of CHEK2 gene mutation. Patient presents for seed localization prior to RIGHT lumpectomy for a complex sclerosing lesion. EXAM: DIGITAL DIAGNOSTIC BILATERAL MAMMOGRAM WITH TOMOSYNTHESIS AND CAD TECHNIQUE: Bilateral digital diagnostic mammography and breast tomosynthesis was performed. The images were evaluated with computer-aided detection. COMPARISON:  Previous exam(s). ACR Breast Density Category c: The breasts are heterogeneously dense, which may obscure small masses. FINDINGS: No suspicious mass, distortion, or microcalcifications are identified to suggest presence of malignancy. Barbell and ribbon shaped clips are present in the RIGHT breast. The barbell shaped clip marks the site of recent MR guided core biopsy. IMPRESSION: No mammographic evidence for malignancy. RECOMMENDATION: Seed localization for RIGHT lumpectomy.  I have discussed the findings and recommendations with the patient. If applicable, a reminder letter will be sent to the patient regarding the next appointment. BI-RADS CATEGORY  1: Negative. Electronically Signed   By: Norva Pavlov M.D.   On: 02/03/2023 11:31

## 2023-02-17 NOTE — Assessment & Plan Note (Signed)
We discussed the importance of nicotine cessation She will try her best to quit smoking 

## 2023-02-17 NOTE — Assessment & Plan Note (Signed)
She had recent biopsy that came back benign The patient has finally made informed decision to proceed with bilateral mastectomy without reconstruction surgery I support the patient's decision and we will see her back in 3 months for further follow-up

## 2023-02-17 NOTE — Assessment & Plan Note (Signed)
We discussed test results The patient is currently in remission with major molecular response She will continue current dose of Dasatinib For her upcoming breast surgery, advised the patient to stop Dasatinib for 7 days prior to surgery and resume the day after I will continue to see her every 3 months for follow-up

## 2023-02-22 ENCOUNTER — Encounter: Payer: Self-pay | Admitting: Internal Medicine

## 2023-02-24 ENCOUNTER — Encounter: Payer: Self-pay | Admitting: Certified Registered Nurse Anesthetist

## 2023-02-25 ENCOUNTER — Ambulatory Visit (AMBULATORY_SURGERY_CENTER): Payer: BC Managed Care – PPO | Admitting: Internal Medicine

## 2023-02-25 ENCOUNTER — Other Ambulatory Visit (HOSPITAL_COMMUNITY): Payer: Self-pay

## 2023-02-25 ENCOUNTER — Encounter: Payer: Self-pay | Admitting: Internal Medicine

## 2023-02-25 VITALS — BP 108/55 | HR 61 | Temp 97.3°F | Resp 12 | Ht 64.0 in | Wt 145.0 lb

## 2023-02-25 DIAGNOSIS — D128 Benign neoplasm of rectum: Secondary | ICD-10-CM | POA: Diagnosis not present

## 2023-02-25 DIAGNOSIS — D125 Benign neoplasm of sigmoid colon: Secondary | ICD-10-CM

## 2023-02-25 DIAGNOSIS — D127 Benign neoplasm of rectosigmoid junction: Secondary | ICD-10-CM | POA: Diagnosis not present

## 2023-02-25 DIAGNOSIS — Z1211 Encounter for screening for malignant neoplasm of colon: Secondary | ICD-10-CM | POA: Diagnosis present

## 2023-02-25 DIAGNOSIS — K5730 Diverticulosis of large intestine without perforation or abscess without bleeding: Secondary | ICD-10-CM | POA: Diagnosis not present

## 2023-02-25 DIAGNOSIS — D123 Benign neoplasm of transverse colon: Secondary | ICD-10-CM | POA: Diagnosis not present

## 2023-02-25 MED ORDER — COLESTIPOL HCL 1 G PO TABS
2.0000 g | ORAL_TABLET | Freq: Every day | ORAL | 2 refills | Status: DC
  Filled 2023-02-25 – 2023-04-29 (×2): qty 60, 30d supply, fill #0
  Filled 2023-05-16: qty 60, 30d supply, fill #1

## 2023-02-25 MED ORDER — SODIUM CHLORIDE 0.9 % IV SOLN: 500.0000 mL | Freq: Once | INTRAVENOUS | Status: DC

## 2023-02-25 NOTE — Progress Notes (Signed)
Pt's states no medical or surgical changes since previsit or office visit. VS assessed by C.W 

## 2023-02-25 NOTE — Progress Notes (Signed)
GASTROENTEROLOGY PROCEDURE H&P NOTE   Primary Care Physician: Georgianne Fick, MD    Reason for Procedure:   Colon cancer screening, CHEK2 mutation, prior SBO s/p ileocecectomy  Plan:    Colonoscopy  Patient is appropriate for endoscopic procedure(s) in the ambulatory ([facility]) setting.  The nature of the procedure, as well as the risks, benefits, and alternatives were carefully and thoroughly reviewed with the patient. Ample time for discussion and questions allowed. The patient understood, was satisfied, and agreed to proceed.     HPI: [name] is a 57 y.o. female who presents for colonoscopy for evaluation of colon cancer screening, CHEK2 mutation, and prior SBO s/p ileocecectomy .  Patient was most recently seen in the Gastroenterology Clinic on 01/28/23.  No interval change in medical history since that appointment. Please refer to that note for full details regarding GI history and clinical presentation.   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   Osteoporosis     Past Surgical History:  Procedure Laterality Date   ABDOMINAL HYSTERECTOMY  2009   APPENDECTOMY Bilateral 2005   bowel removal  2005   5 inches   BOWEL RESECTION  2005   2 feet   BREAST BIOPSY Right 04/29/2019   APOCRINE METAPLASIA    BREAST BIOPSY  02/03/2023   MM RT RADIOACTIVE SEED LOC MAMMO GUIDE 02/03/2023 [facility]   BREAST LUMPECTOMY WITH RADIOACTIVE SEED LOCALIZATION Left 10/03/2021   Procedure: LEFT BREAST LUMPECTOMY WITH RADIOACTIVE SEED LOCALIZATION;  Surgeon: Griselda Miner, MD;  Location:  [facility];  Service: General;  Laterality: Left;   BREAST LUMPECTOMY WITH RADIOACTIVE SEED LOCALIZATION Right 02/04/2023   Procedure: RIGHT BREAST LUMPECTOMY WITH RADIOACTIVE SEED LOCALIZATION;  Surgeon: Almond Lint, MD;  Location: [facility] OR;  Service: General;  Laterality: Right;   FRACTURE SURGERY     Collar Bone Left Plate placed   TONSILLECTOMY     As a child    Prior to Admission medications   Medication Sig Start Date End Date Taking? Authorizing Provider  acetaminophen (TYLENOL) 325 MG tablet Take 325-650 mg by mouth daily as needed (pain).    [provider]  calcium carbonate (TUMS - DOSED IN MG ELEMENTAL CALCIUM) 500 MG chewable tablet Chew 1 tablet by mouth 2 (two) times daily as needed for heartburn or indigestion.    [provider]  Calcium Carbonate-Vit D-Min (CALCIUM 1200 PO) Take 1 tablet by mouth in the morning and at bedtime.    [provider]  cholecalciferol (VITAMIN D3) 25 MCG (1000 UNIT) tablet Take 1,000 Units by mouth in the morning.    [provider]  clonazePAM (KLONOPIN) 0.5 MG tablet Take 0.5 mg by mouth 2 (two) times daily as needed for anxiety.    [provider]  colestipol (COLESTID) 1 g tablet Take 2 tablets (2 g total) by mouth 2 (two) times daily. Patient not taking: Reported on 02/11/2023 01/28/23 02/27/23  Imogene Burn, MD  dasatinib [facility])  140 MG tablet Take 1 tablet (140 mg total) by mouth daily. Patient taking differently: Take 140 mg by mouth at bedtime. 06/10/22   Artis Delay, MD  diazepam (VALIUM) 5 MG tablet Take 1 tablet by mouth 1 hour before procedure with very light food. May bring 2nd tablet to appointment. 12/18/22   Juanda Chance, NP  escitalopram (LEXAPRO) 20 MG tablet Take 30 mg by mouth daily. 10/18/19   [provider]  Eyelid Cleansers (AVENOVA EX) Apply 1 spray topically 2 (two) times daily as needed (eye debris.).    [provider]  famotidine (PEPCID) 20  MG tablet Take 20 mg by mouth 2 (two) times daily.    [provider]  fexofenadine (ALLEGRA) 180 MG tablet Take 180 mg by mouth daily as needed for allergies or rhinitis.    [provider]  ibuprofen (ADVIL) 200 MG tablet Take 400-600 mg by mouth every 8 (eight) hours as needed (pain.).    [provider]  ketoconazole (NIZORAL) 2 % cream Apply Externally twice a day 28 days Patient taking differently: Apply 1 Application topically See admin instructions. One application up to twice daily for skin irritation (underarms). 03/31/22     Lactobacillus (PROBIOTIC ACIDOPHILUS PO) Take 1 capsule by mouth in the morning.    [provider]  loperamide (IMODIUM) 2 MG capsule Take 2 mg by mouth in the morning and at bedtime.    [provider]  LORazepam (ATIVAN) 1 MG tablet Take 1 tablet (1 mg total) by mouth 2 (two) times daily as needed for anxiety. 08/09/21   Artis Delay, MD  metaxalone (SKELAXIN) 400 MG tablet Take 1 tablet (400 mg total) by mouth 3 (three) times daily as needed. Patient taking differently: Take 200-400 mg by mouth 3 (three) times daily as needed. 11/26/22   Juanda Chance, NP  Multiple Vitamin (MULTIVITAMIN WITH MINERALS) TABS tablet Take 1 tablet by mouth in the morning.    [provider]  oxyCODONE (OXY IR/ROXICODONE) 5 MG immediate release tablet Take 1 tablet (5 mg total) by mouth every 6 (six) hours as needed for severe pain. 02/04/23   Almond Lint, MD    Current Outpatient Medications  Medication Sig Dispense Refill   acetaminophen (TYLENOL) 325 MG tablet Take 325-650 mg by mouth daily as needed (pain).     calcium carbonate (TUMS - DOSED IN MG ELEMENTAL CALCIUM) 500 MG chewable tablet Chew 1 tablet by mouth 2 (two) times daily as needed for heartburn or indigestion.     Calcium Carbonate-Vit D-Min (CALCIUM 1200 PO) Take 1 tablet by mouth in the morning and at bedtime.     cholecalciferol (VITAMIN D3) 25 MCG (1000 UNIT)  tablet Take 1,000 Units by mouth in the morning.     clonazePAM (KLONOPIN) 0.5 MG tablet Take 0.5 mg by mouth 2 (two) times daily as needed for anxiety.     colestipol (COLESTID) 1 g tablet Take 2 tablets (2 g total) by mouth 2 (two) times daily. (Patient not taking: Reported on 02/11/2023) 120 tablet 0   dasatinib (SPRYCEL) 140 MG tablet Take 1 tablet (140 mg total) by mouth daily. (Patient taking differently: Take 140 mg by mouth at bedtime.) 30 tablet 11   diazepam (VALIUM) 5 MG tablet Take 1 tablet by mouth 1 hour before procedure with very light food. May bring 2nd tablet to appointment. 2 tablet 0   escitalopram (LEXAPRO) 20 MG tablet Take 30 mg by mouth daily.  Eyelid Cleansers (AVENOVA EX) Apply 1 spray topically 2 (two) times daily as needed (eye debris.).     famotidine (PEPCID) 20 MG tablet Take 20 mg by mouth 2 (two) times daily.     fexofenadine (ALLEGRA) 180 MG tablet Take 180 mg by mouth daily as needed for allergies or rhinitis.     ibuprofen (ADVIL) 200 MG tablet Take 400-600 mg by mouth every 8 (eight) hours as needed (pain.).     ketoconazole (NIZORAL) 2 % cream Apply Externally twice a day 28 days (Patient taking differently: Apply 1 Application topically See admin instructions. One application up to twice daily for skin irritation (underarms).) 30 g 3   Lactobacillus (PROBIOTIC ACIDOPHILUS PO) Take 1 capsule by mouth in the morning.     loperamide (IMODIUM) 2 MG capsule Take 2 mg by mouth in the morning and at bedtime.     LORazepam (ATIVAN) 1 MG tablet Take 1 tablet (1 mg total) by mouth 2 (two) times daily as needed for anxiety. 10 tablet 0   metaxalone (SKELAXIN) 400 MG tablet Take 1 tablet (400 mg total) by mouth 3 (three) times daily as needed. (Patient taking differently: Take 200-400 mg by mouth 3 (three) times daily as needed.) 30 tablet 0   Multiple Vitamin (MULTIVITAMIN WITH MINERALS) TABS tablet Take 1 tablet by mouth in the morning.     oxyCODONE (OXY IR/ROXICODONE)  5 MG immediate release tablet Take 1 tablet (5 mg total) by mouth every 6 (six) hours as needed for severe pain. 10 tablet 0   Current Facility-Administered Medications  Medication Dose Route Frequency Provider Last Rate Last Admin   0.9 %  sodium chloride infusion  500 mL Intravenous Once Imogene Burn, MD        Allergies as of 02/25/2023 - Review Complete 02/25/2023  Allergen Reaction Noted   Other Other (See Comments) 10/27/2019   Sulfa antibiotics Swelling 06/08/2013   Wellbutrin [bupropion] Other (See Comments) 10/21/2019   Zosyn [piperacillin-tazobactam in dex] Rash 01/26/2023    Family History  Problem Relation Age of Onset   Breast cancer Mother        bilateral; dx 52, 18   Acute myelogenous leukemia Mother        dx late 54s   Pancreatic cancer Father 58   Breast cancer Maternal Aunt        dx after 50   Breast cancer Maternal Aunt        dx after 50   Bladder Cancer Paternal Uncle        dx after 50   Colon cancer Maternal Grandmother 81   Esophageal cancer Neg Hx    Rectal cancer Neg Hx    Stomach cancer Neg Hx     Social History   Socioeconomic History   Marital status: Single    Spouse name: Not on file   Number of children: 0   Years of education: Not on file   Highest education level: Not on file  Occupational History   Occupation: retired   Occupation: IT sales professional  Tobacco Use   Smoking status: Every Day    Current packs/day: 1.00    Average packs/day: 1 pack/day for 17.0 years (17.0 ttl pk-yrs)    Types: Cigarettes   Smokeless tobacco: Never  Vaping Use   Vaping status: Never Used  Substance and Sexual Activity   Alcohol use: Not Currently   Drug use: No   Sexual activity: Yes  Other Topics Concern  Not on file  Social History Narrative   Not on file   Social Determinants of Health   Financial Resource Strain: Not on file  Food Insecurity: Not on file  Transportation Needs: Not on file  Physical Activity: Not on file   Stress: Not on file  Social Connections: Not on file  Intimate Partner Violence: Not on file    Physical Exam: Vital signs in last 24 hours: BP 114/61   Pulse 76   Temp (!) 97.3 F (36.3 C) (Skin)   Ht 5\' 4"  (1.626 m)   Wt 145 lb (65.8 kg)   LMP 11/18/2012   SpO2 94%   BMI 24.89 kg/m  GEN: NAD EYE: Sclerae anicteric ENT: MMM CV: Non-tachycardic Pulm: No increased WOB GI: Soft NEURO:  Alert & Oriented   Eulah Pont, MD [facility]   02/25/2023 2:42 PM

## 2023-02-25 NOTE — Progress Notes (Signed)
Report given to PACU, vss 

## 2023-02-25 NOTE — Op Note (Signed)
[facility] Patient Name: [name] Procedure Date: 02/25/2023 2:56 PM MRN: [id number] Endoscopist: Madelyn Brunner [place] , , [id number] Age: 57 Referring MD:  Date of Birth: 16-Jan-1966 Gender: Female Account #: [id number] Procedure:                Colonoscopy Indications:              Screening for colorectal malignant neoplasm, This                            is the patient's first colonoscopy Medicines:                Monitored Anesthesia Care Procedure:                Pre-Anesthesia Assessment:                           - Prior to the procedure, a History and Physical                            was performed, and patient medications and                            allergies were reviewed. The patient's tolerance of                            previous anesthesia was also reviewed. The risks                            and benefits of the procedure and the sedation                            options and risks were discussed with the patient.                            All questions were answered, and informed consent                            was obtained. Prior Anticoagulants: The patient has                            taken no anticoagulant or antiplatelet agents. ASA                            Grade Assessment: II - A patient with mild systemic                            disease. After reviewing the risks and benefits,                            the patient was deemed in satisfactory condition to                            undergo the procedure.  After obtaining informed consent, the colonoscope                            was passed under direct vision. Throughout the                            procedure, the patient's blood pressure, pulse, and                            oxygen saturations were monitored continuously. The                            Olympus PCF-[id number] (#[id number]) Colonoscope was                            introduced through  the anus and advanced to the the                            terminal ileum. The colonoscopy was performed                            without difficulty. The patient tolerated the                            procedure well. The quality of the bowel                            preparation was good. The terminal ileum, ileocecal                            valve, appendiceal orifice, and rectum were                            photographed. Scope In: 3:02:57 PM Scope Out: 3:29:41 PM Scope Withdrawal Time: 0 hours 20 minutes 53 seconds  Total Procedure Duration: 0 hours 26 minutes 44 seconds  Findings:                 The neo-terminal ileum appeared normal.                           There was evidence of a prior side-to-side                            ileo-colonic anastomosis in the cecum. This was                            patent and was characterized by healthy appearing                            mucosa. The anastomosis was traversed.                           Four sessile polyps were found in the rectum,  sigmoid colon and transverse colon. The polyps were                            3 to 10 mm in size. These polyps were removed with                            a cold snare. Resection and retrieval were complete.                           Multiple diverticula were found in the sigmoid                            colon and descending colon.                           A localized area of mildly erythematous mucosa was                            found in the sigmoid colon. This along with other                            areas of the colon were biopsied with a cold                            forceps for histology.                           Non-bleeding internal hemorrhoids were found during                            retroflexion. Complications:            No immediate complications. Estimated Blood Loss:     Estimated blood loss was minimal. Impression:               -  The examined portion of the ileum was normal.                           - Patent side-to-side ileo-colonic anastomosis,                            characterized by healthy appearing mucosa.                           - Four 3 to 10 mm polyps in the rectum, in the                            sigmoid colon and in the transverse colon, removed                            with a cold snare. Resected and retrieved.                           - Diverticulosis in the sigmoid colon and in the  descending colon.                           - Erythematous mucosa in the sigmoid colon.                            Biopsied.                           - Non-bleeding internal hemorrhoids. Recommendation:           - Discharge patient to home (with escort).                           - Await pathology results.                           - Return to GI clinic in 2-3 months.                           - The findings and recommendations were discussed                            with the patient. Dr Particia Lather "Alan Ripper" Leonides Schanz,  02/25/2023 3:36:44 PM

## 2023-02-25 NOTE — Patient Instructions (Addendum)
YOU HAD AN ENDOSCOPIC PROCEDURE TODAY AT THE [facility]:   Refer to the procedure report that was given to you for any specific questions about what was found during the examination.  If the procedure report does not answer your questions, please call your gastroenterologist to clarify.  If you requested that your care partner not be given the details of your procedure findings, then the procedure report has been included in a sealed envelope for you to review at your convenience later.  YOU SHOULD EXPECT: Some feelings of bloating in the abdomen. Passage of more gas than usual.  Walking can help get rid of the air that was put into your GI tract during the procedure and reduce the bloating. If you had a lower endoscopy (such as a colonoscopy or flexible sigmoidoscopy) you may notice spotting of blood in your stool or on the toilet paper. If you underwent a bowel prep for your procedure, you may not have a normal bowel movement for a few days.  Please Note:  You might notice some irritation and congestion in your nose or some drainage.  This is from the oxygen used during your procedure.  There is no need for concern and it should clear up in a day or so.  SYMPTOMS TO REPORT IMMEDIATELY:  Following lower endoscopy (colonoscopy or flexible sigmoidoscopy):  Excessive amounts of blood in the stool  Significant tenderness or worsening of abdominal pains  Swelling of the abdomen that is new, acute  Fever of 100F or higher   For urgent or emergent issues, a gastroenterologist can be reached at any hour by calling [phone]. Do not use MyChart messaging for urgent concerns.    DIET:  We do recommend a small meal at first, but then you may proceed to your regular diet.  Drink plenty of fluids but you should avoid alcoholic beverages for 24 hours.  MEDICATIONS: Continue present medications.  Please see handouts given to you by your recovery nurse: Polyps, Diverticulosis,  Hemorrhoids.  FOLLOW UP: Await pathology results. Return to see Dr. Leonides Schanz in her office (3rd floor) in 2-3 months. Appointment made for November 7th at 1010 am.  Thank you for allowing [place] to provide for your healthcare needs today.  ACTIVITY:  You should plan to take it easy for the rest of today and you should NOT DRIVE or use heavy machinery until tomorrow (because of the sedation medicines used during the test).    FOLLOW UP: Our staff will call the number listed on your records the next business day following your procedure.  We will call around 7:15- 8:00 am to check on you and address any questions or concerns that you may have regarding the information given to you following your procedure. If we do not reach you, we will leave a message.     If any biopsies were taken you will be contacted by phone or by letter within the next 1-3 weeks.  Please call us at [phone] if you have not heard about the biopsies in 3 weeks.    SIGNATURES/CONFIDENTIALITY: You and/or your care partner have signed paperwork which will be entered into your electronic medical record.  These signatures attest to the fact that that the information above on your After Visit Summary has been reviewed and is understood.  Full responsibility of the confidentiality of this discharge information lies with you and/or your care-partner.

## 2023-02-25 NOTE — Telephone Encounter (Signed)
Noted by [facility] RN

## 2023-02-25 NOTE — Progress Notes (Signed)
Called to room to assist during endoscopic procedure.  Patient ID and intended procedure confirmed with present staff. Received instructions for my participation in the procedure from the performing physician.  

## 2023-02-26 ENCOUNTER — Telehealth: Payer: Self-pay

## 2023-02-26 NOTE — Telephone Encounter (Signed)
Attempted f/u call. No answer, left VM. 

## 2023-03-05 ENCOUNTER — Other Ambulatory Visit (HOSPITAL_COMMUNITY): Payer: Self-pay

## 2023-03-06 ENCOUNTER — Other Ambulatory Visit (HOSPITAL_COMMUNITY): Payer: Self-pay

## 2023-03-09 ENCOUNTER — Other Ambulatory Visit: Payer: Self-pay

## 2023-03-09 ENCOUNTER — Other Ambulatory Visit (HOSPITAL_COMMUNITY): Payer: Self-pay

## 2023-03-10 ENCOUNTER — Encounter: Payer: Self-pay | Admitting: Internal Medicine

## 2023-03-13 ENCOUNTER — Other Ambulatory Visit (HOSPITAL_COMMUNITY): Payer: Self-pay

## 2023-03-31 ENCOUNTER — Other Ambulatory Visit (HOSPITAL_COMMUNITY): Payer: Self-pay

## 2023-04-02 ENCOUNTER — Other Ambulatory Visit (HOSPITAL_COMMUNITY): Payer: Self-pay

## 2023-04-06 ENCOUNTER — Encounter (HOSPITAL_COMMUNITY): Payer: Self-pay

## 2023-04-06 ENCOUNTER — Other Ambulatory Visit (HOSPITAL_COMMUNITY): Payer: Self-pay

## 2023-04-08 ENCOUNTER — Other Ambulatory Visit: Payer: Self-pay

## 2023-04-08 ENCOUNTER — Other Ambulatory Visit (HOSPITAL_COMMUNITY): Payer: Self-pay

## 2023-04-13 ENCOUNTER — Other Ambulatory Visit: Payer: Self-pay

## 2023-04-16 ENCOUNTER — Other Ambulatory Visit (HOSPITAL_COMMUNITY): Payer: Self-pay

## 2023-04-20 ENCOUNTER — Inpatient Hospital Stay: Payer: BC Managed Care – PPO | Attending: Hematology and Oncology

## 2023-04-20 ENCOUNTER — Encounter: Payer: Self-pay | Admitting: Internal Medicine

## 2023-04-20 DIAGNOSIS — C9210 Chronic myeloid leukemia, BCR/ABL-positive, not having achieved remission: Secondary | ICD-10-CM | POA: Insufficient documentation

## 2023-04-20 DIAGNOSIS — G8929 Other chronic pain: Secondary | ICD-10-CM

## 2023-04-20 LAB — CBC WITH DIFFERENTIAL/PLATELET
Abs Immature Granulocytes: 0.01 10*3/uL (ref 0.00–0.07)
Basophils Absolute: 0.1 10*3/uL (ref 0.0–0.1)
Basophils Relative: 1 %
Eosinophils Absolute: 0.6 10*3/uL — ABNORMAL HIGH (ref 0.0–0.5)
Eosinophils Relative: 8 %
HCT: 38.8 % (ref 36.0–46.0)
Hemoglobin: 13.5 g/dL (ref 12.0–15.0)
Immature Granulocytes: 0 %
Lymphocytes Relative: 42 %
Lymphs Abs: 2.9 10*3/uL (ref 0.7–4.0)
MCH: 33.6 pg (ref 26.0–34.0)
MCHC: 34.8 g/dL (ref 30.0–36.0)
MCV: 96.5 fL (ref 80.0–100.0)
Monocytes Absolute: 0.4 10*3/uL (ref 0.1–1.0)
Monocytes Relative: 6 %
Neutro Abs: 3 10*3/uL (ref 1.7–7.7)
Neutrophils Relative %: 43 %
Platelets: 277 10*3/uL (ref 150–400)
RBC: 4.02 MIL/uL (ref 3.87–5.11)
RDW: 12.2 % (ref 11.5–15.5)
WBC: 6.9 10*3/uL (ref 4.0–10.5)
nRBC: 0 % (ref 0.0–0.2)

## 2023-04-20 LAB — COMPREHENSIVE METABOLIC PANEL
ALT: 28 U/L (ref 0–44)
AST: 36 U/L (ref 15–41)
Albumin: 4.4 g/dL (ref 3.5–5.0)
Alkaline Phosphatase: 73 U/L (ref 38–126)
Anion gap: 7 (ref 5–15)
BUN: 18 mg/dL (ref 6–20)
CO2: 28 mmol/L (ref 22–32)
Calcium: 9.6 mg/dL (ref 8.9–10.3)
Chloride: 105 mmol/L (ref 98–111)
Creatinine, Ser: 1.19 mg/dL — ABNORMAL HIGH (ref 0.44–1.00)
GFR, Estimated: 54 mL/min — ABNORMAL LOW (ref >60)
Glucose, Bld: 115 mg/dL — ABNORMAL HIGH (ref 70–99)
Potassium: 4.1 mmol/L (ref 3.5–5.1)
Sodium: 140 mmol/L (ref 135–145)
Total Bilirubin: 0.4 mg/dL (ref 0.3–1.2)
Total Protein: 6.7 g/dL (ref 6.5–8.1)

## 2023-04-27 LAB — BCR/ABL

## 2023-04-29 ENCOUNTER — Other Ambulatory Visit: Payer: Self-pay

## 2023-04-29 ENCOUNTER — Other Ambulatory Visit (HOSPITAL_COMMUNITY): Payer: Self-pay

## 2023-04-30 ENCOUNTER — Inpatient Hospital Stay: Payer: BC Managed Care – PPO | Attending: Hematology and Oncology | Admitting: Hematology and Oncology

## 2023-04-30 ENCOUNTER — Encounter: Payer: Self-pay | Admitting: Hematology and Oncology

## 2023-04-30 VITALS — BP 117/67 | HR 73 | Resp 18 | Ht 64.0 in | Wt 147.0 lb

## 2023-04-30 DIAGNOSIS — F17210 Nicotine dependence, cigarettes, uncomplicated: Secondary | ICD-10-CM | POA: Insufficient documentation

## 2023-04-30 DIAGNOSIS — M810 Age-related osteoporosis without current pathological fracture: Secondary | ICD-10-CM | POA: Insufficient documentation

## 2023-04-30 DIAGNOSIS — Z1502 Genetic susceptibility to malignant neoplasm of ovary: Secondary | ICD-10-CM

## 2023-04-30 DIAGNOSIS — C9210 Chronic myeloid leukemia, BCR/ABL-positive, not having achieved remission: Secondary | ICD-10-CM | POA: Diagnosis present

## 2023-04-30 DIAGNOSIS — Z1509 Genetic susceptibility to other malignant neoplasm: Secondary | ICD-10-CM

## 2023-04-30 DIAGNOSIS — Z1589 Genetic susceptibility to other disease: Secondary | ICD-10-CM | POA: Diagnosis not present

## 2023-04-30 DIAGNOSIS — Z1501 Genetic susceptibility to malignant neoplasm of breast: Secondary | ICD-10-CM

## 2023-04-30 DIAGNOSIS — E7800 Pure hypercholesterolemia, unspecified: Secondary | ICD-10-CM | POA: Diagnosis not present

## 2023-04-30 DIAGNOSIS — M549 Dorsalgia, unspecified: Secondary | ICD-10-CM | POA: Insufficient documentation

## 2023-04-30 NOTE — Progress Notes (Signed)
[facility] OFFICE PROGRESS NOTE  Patient Care Team: Georgianne Fick, MD as PCP - General (Internal Medicine)  HISTORY OF PRESENTING ILLNESS: Discussed the use of AI scribe software for clinical note transcription with the patient, who gave verbal consent to proceed.  History of Present Illness   The patient, with a history of CML and high cholesterol and high-risk breast cancer patient, has been managing her medication schedule to accommodate colestipol, calcium, and Sprycel. She has been taking colestipol at noon, calcium at 5 PM, and Sprycel between 10 PM and midnight. She recently resumed colestipol after a brief discontinuation due to scheduling difficulties, taking all four pills at once instead of spreading them out throughout the day.  The patient is also scheduled for a mastectomy in January due to concerns for future breast cancer diagnosis. She has been experiencing severe back pain, for which she has been taking acetaminophen. She has also been dealing with osteoporosis, with a T score of -3.8, and has previously taken a Reclast infusion, which caused chills and fainting. She is considering other treatment options for osteoporosis, including Prolia.  The patient is a smoker and has been advised to quit before her upcoming surgery. She has also been experiencing some difficulty with her back, with pain shifting from one area to another.         Assessment and Plan    Chronic Myeloid Leukemia (CML) Recent increase in BCR-ABL1 level. Patient recently restarted Colestipol for cholesterol management. Discussed potential impact of medication timing on Sprycel absorption. -Continue current regimen of Sprycel. -Check BCR-ABL1 level in 3 months.  Hypercholesterolemia Patient recently restarted Colestipol due to high cholesterol. Discussed potential alternative treatments. -Consider discussing alternative cholesterol medications with primary care provider.  High risk  for Breast Cancer Patient scheduled for mastectomy in January. Discussed need for mammogram and MRI. -Schedule mammogram and MRI as per usual alternating 98-month schedule until mastectomy  Back Pain Patient experiencing severe back pain, currently managing with acetaminophen. -Consider low-impact exercises such as yoga and pool walking for pain management.  Osteoporosis Patient experienced adverse effects with Reclast infusion. Discussed potential alternative treatments. -Consider discussing Prolia as an alternative treatment with primary care provider.  Smoking Patient is a current smoker with upcoming surgery. -Encourage smoking cessation prior to surgery.  Follow-up in 3 months prior to surgery to discuss perioperative management of Sprycel.          No orders of the defined types were placed in this encounter.   All questions were answered. The patient knows to call the clinic with any problems, questions or concerns. The total time spent in the appointment was 30 minutes encounter with patients including review of chart and various tests results, discussions about plan of care and coordination of care plan   Artis Delay, MD 04/30/2023 1:38 PM  REVIEW OF SYSTEMS:  All other systems were reviewed with the patient and are negative.  I have reviewed the past medical history, past surgical history, social history and family history with the patient and they are unchanged from previous note.  ALLERGIES:  is allergic to other, sulfa antibiotics, wellbutrin [bupropion], and zosyn [piperacillin-tazobactam in dex].  MEDICATIONS:  Current Outpatient Medications  Medication Sig Dispense Refill   acetaminophen (TYLENOL) 325 MG tablet Take 325-650 mg by mouth daily as needed (pain).     calcium carbonate (TUMS - DOSED IN MG ELEMENTAL CALCIUM) 500 MG chewable tablet Chew 1 tablet by mouth 2 (two) times daily as needed for heartburn  or indigestion.     Calcium Carbonate-Vit D-Min  (CALCIUM 1200 PO) Take 1 tablet by mouth in the morning and at bedtime.     cholecalciferol (VITAMIN D3) 25 MCG (1000 UNIT) tablet Take 1,000 Units by mouth in the morning.     clonazePAM (KLONOPIN) 0.5 MG tablet Take 0.5 mg by mouth 2 (two) times daily as needed for anxiety.     colestipol (COLESTID) 1 g tablet Take 2 tablets (2 g total) by mouth daily. 60 tablet 2   dasatinib (SPRYCEL) 140 MG tablet Take 1 tablet (140 mg total) by mouth daily. (Patient taking differently: Take 140 mg by mouth at bedtime.) 30 tablet 11   diazepam (VALIUM) 5 MG tablet Take 1 tablet by mouth 1 hour before procedure with very light food. May bring 2nd tablet to appointment. (Patient not taking: Reported on 02/25/2023) 2 tablet 0   escitalopram (LEXAPRO) 20 MG tablet Take 30 mg by mouth daily.     Eyelid Cleansers (AVENOVA EX) Apply 1 spray topically 2 (two) times daily as needed (eye debris.). (Patient not taking: Reported on 02/25/2023)     famotidine (PEPCID) 20 MG tablet Take 20 mg by mouth 2 (two) times daily. (Patient not taking: Reported on 02/25/2023)     fexofenadine (ALLEGRA) 180 MG tablet Take 180 mg by mouth daily as needed for allergies or rhinitis.     ibuprofen (ADVIL) 200 MG tablet Take 400-600 mg by mouth every 8 (eight) hours as needed (pain.).     ketoconazole (NIZORAL) 2 % cream Apply Externally twice a day 28 days (Patient taking differently: Apply 1 Application topically See admin instructions. One application up to twice daily for skin irritation (underarms).) 30 g 3   Lactobacillus (PROBIOTIC ACIDOPHILUS PO) Take 1 capsule by mouth in the morning.     loperamide (IMODIUM) 2 MG capsule Take 2 mg by mouth in the morning and at bedtime.     LORazepam (ATIVAN) 1 MG tablet Take 1 tablet (1 mg total) by mouth 2 (two) times daily as needed for anxiety. 10 tablet 0   metaxalone (SKELAXIN) 400 MG tablet Take 1 tablet (400 mg total) by mouth 3 (three) times daily as needed. (Patient taking differently: Take  200-400 mg by mouth 3 (three) times daily as needed.) 30 tablet 0   Multiple Vitamin (MULTIVITAMIN WITH MINERALS) TABS tablet Take 1 tablet by mouth in the morning.     oxyCODONE (OXY IR/ROXICODONE) 5 MG immediate release tablet Take 1 tablet (5 mg total) by mouth every 6 (six) hours as needed for severe pain. 10 tablet 0   Current Facility-Administered Medications  Medication Dose Route Frequency Provider Last Rate Last Admin   0.9 %  sodium chloride infusion  500 mL Intravenous Once Imogene Burn, MD        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HCC)  03/01/2021 Initial Diagnosis   CML (chronic myeloid leukemia) (HCC)   03/01/2021 Pathology Results   BCR/ABL by FISH: 91% positive BRC/ABL by PCR: e13a2 (b2a2) by IS is 196.685%   04/25/2021 -  Chemotherapy   She started taking imatinib   05/15/2021 Pathology Results   BRC/ABL is detected by PCR: e13a2 (b2a2) by IS is 70.731%   06/17/2021 Pathology Results   e13a2 (b2a2) by Quantidex (IS): 39.7401%   07/24/2021 Pathology Results   e13a2 (b2a2) by Quantidex (IS): 33.3%   08/05/2021 -  Chemotherapy   She started taking Sprycel    08/30/2021  Pathology Results   e13a2 (b2a2) by Quantidex (IS): 9.8004%   11/18/2021 Pathology Results   e13a2 (b2a2) by Quantidex (IS): 0.8987%   12/17/2021 Pathology Results   e13a2 (b2a2) by Quantidex (IS): 0.4764%   02/24/2022 Pathology Results   e13a2 (b2a2) by Quantidex (IS): 0.2635%   04/18/2022 Pathology Results   e13a2 (b2a2) by Quantidex (IS): 0.2739%    05/27/2022 Pathology Results   e13a2 (b2a2) by Quantidex (IS): 0.3033     07/16/2022 Pathology Results   e13a2 (b2a2) by Quantidex (IS): 0.1576%   10/20/2022 Pathology Results   e13a2 (b2a2) by Quantidex (IS): 0.1384%   02/02/2023 Pathology Results   13a2 (b2a2) by Quantidex (IS): 0.0313%    04/20/2023 Pathology Results   e13a2 (b2a2) by Quantidex (IS): 0.1316%      PHYSICAL EXAMINATION: ECOG  PERFORMANCE STATUS: 1 - Symptomatic but completely ambulatory  Vitals:   04/30/23 1134  BP: 117/67  Pulse: 73  Resp: 18  SpO2: 98%   Filed Weights   04/30/23 1134  Weight: 147 lb (66.7 kg)    GENERAL:alert, no distress and comfortable  LABORATORY DATA:  I have reviewed the data as listed    Component Value Date/Time   NA 140 04/20/2023 1210   K 4.1 04/20/2023 1210   CL 105 04/20/2023 1210   CO2 28 04/20/2023 1210   GLUCOSE 115 (H) 04/20/2023 1210   BUN 18 04/20/2023 1210   CREATININE 1.19 (H) 04/20/2023 1210   CREATININE 0.95 07/18/2022 1206   CALCIUM 9.6 04/20/2023 1210   PROT 6.7 04/20/2023 1210   ALBUMIN 4.4 04/20/2023 1210   AST 36 04/20/2023 1210   AST 31 07/18/2022 1206   ALT 28 04/20/2023 1210   ALT 33 07/18/2022 1206   ALKPHOS 73 04/20/2023 1210   BILITOT 0.4 04/20/2023 1210   BILITOT 0.4 07/18/2022 1206   GFRNONAA 54 (L) 04/20/2023 1210   GFRNONAA >60 07/18/2022 1206   GFRAA >60 10/21/2019 1020    No results found for: "SPEP", "UPEP"  Lab Results  Component Value Date   WBC 6.9 04/20/2023   NEUTROABS 3.0 04/20/2023   HGB 13.5 04/20/2023   HCT 38.8 04/20/2023   MCV 96.5 04/20/2023   PLT 277 04/20/2023      Chemistry      Component Value Date/Time   NA 140 04/20/2023 1210   K 4.1 04/20/2023 1210   CL 105 04/20/2023 1210   CO2 28 04/20/2023 1210   BUN 18 04/20/2023 1210   CREATININE 1.19 (H) 04/20/2023 1210   CREATININE 0.95 07/18/2022 1206      Component Value Date/Time   CALCIUM 9.6 04/20/2023 1210   ALKPHOS 73 04/20/2023 1210   AST 36 04/20/2023 1210   AST 31 07/18/2022 1206   ALT 28 04/20/2023 1210   ALT 33 07/18/2022 1206   BILITOT 0.4 04/20/2023 1210   BILITOT 0.4 07/18/2022 1206

## 2023-05-07 ENCOUNTER — Other Ambulatory Visit (HOSPITAL_COMMUNITY): Payer: Self-pay

## 2023-05-11 ENCOUNTER — Other Ambulatory Visit: Payer: Self-pay

## 2023-05-11 MED ORDER — SPRYCEL 140 MG PO TABS
140.0000 mg | ORAL_TABLET | Freq: Every day | ORAL | 3 refills | Status: DC
  Filled 2023-05-11: qty 30, 30d supply, fill #0
  Filled 2023-06-11: qty 30, 30d supply, fill #1
  Filled 2023-07-10: qty 30, 30d supply, fill #2
  Filled 2023-08-06: qty 15, 15d supply, fill #3

## 2023-05-11 NOTE — Progress Notes (Signed)
Specialty Pharmacy Refill Coordination Note  [name] is a 57 y.o. female contacted today regarding refills of specialty medication(s) Dasatinib   Patient requested [name] at [facility] at [place] date: 05/13/23   Medication will be filled on 05/12/23.

## 2023-05-11 NOTE — Progress Notes (Signed)
Clinical Intervention Note  Clinical Intervention Notes: Pt questioned if Colestipol is interfering wtih Sprycel results even with spacing the dosing apart as recommended. Up to date, there is no drug interaction between the two. Referred patient to call nurse triage line to consult with oncology clinic.   Clinical Intervention Outcomes: Improved therapy effectiveness   Bobette Mo Specialty Pharmacist

## 2023-05-16 ENCOUNTER — Other Ambulatory Visit (HOSPITAL_COMMUNITY): Payer: Self-pay

## 2023-05-18 ENCOUNTER — Other Ambulatory Visit: Payer: Self-pay

## 2023-05-19 ENCOUNTER — Other Ambulatory Visit (HOSPITAL_COMMUNITY): Payer: Self-pay

## 2023-05-19 MED ORDER — COLESTIPOL HCL 1 G PO TABS
4.0000 g | ORAL_TABLET | Freq: Every day | ORAL | 2 refills | Status: DC
  Filled 2023-05-19 – 2023-05-20 (×3): qty 120, 30d supply, fill #0

## 2023-05-20 ENCOUNTER — Other Ambulatory Visit (HOSPITAL_COMMUNITY): Payer: Self-pay

## 2023-05-22 ENCOUNTER — Other Ambulatory Visit: Payer: Self-pay

## 2023-05-22 DIAGNOSIS — M810 Age-related osteoporosis without current pathological fracture: Secondary | ICD-10-CM | POA: Insufficient documentation

## 2023-05-27 ENCOUNTER — Telehealth: Payer: Self-pay | Admitting: Pharmacy Technician

## 2023-05-27 NOTE — Telephone Encounter (Signed)
Auth Submission: APPROVED Site of care: Site of care: [facility] Payer: BCBS Medication & CPT/J Code(s) submitted: Prolia (Denosumab) [id number] Route of submission (phone, fax, portal):  Phone # Fax # Auth type: Buy/Bill PB Units/visits requested: 2 Reference number:  Approval from: 05/26/23 to 05/25/24

## 2023-05-28 ENCOUNTER — Encounter: Payer: Self-pay | Admitting: Internal Medicine

## 2023-05-28 ENCOUNTER — Other Ambulatory Visit (HOSPITAL_COMMUNITY): Payer: Self-pay

## 2023-05-28 ENCOUNTER — Ambulatory Visit: Payer: BC Managed Care – PPO | Admitting: Internal Medicine

## 2023-05-28 VITALS — BP 110/60 | HR 75 | Ht 64.0 in | Wt 146.0 lb

## 2023-05-28 DIAGNOSIS — K649 Unspecified hemorrhoids: Secondary | ICD-10-CM

## 2023-05-28 DIAGNOSIS — K219 Gastro-esophageal reflux disease without esophagitis: Secondary | ICD-10-CM | POA: Diagnosis not present

## 2023-05-28 DIAGNOSIS — Z1589 Genetic susceptibility to other disease: Secondary | ICD-10-CM | POA: Diagnosis not present

## 2023-05-28 MED ORDER — COLESTIPOL HCL 1 G PO TABS
4.0000 g | ORAL_TABLET | Freq: Every day | ORAL | 2 refills | Status: DC
  Filled 2023-05-28 – 2023-06-15 (×2): qty 120, 30d supply, fill #0
  Filled 2023-07-14: qty 120, 30d supply, fill #1
  Filled 2023-08-17 – 2023-08-22 (×2): qty 120, 30d supply, fill #2

## 2023-05-28 NOTE — Progress Notes (Signed)
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 This led 3 subsequent intestinal surgeries including an ileocecectomy.  After her surgery, she has had issues with diarrhea and has to follow a low residue diet for the most part.  Sometimes she eats too much fiber, which causes more issues with diarrhea.  She takes Imodium daily to help with her diarrhea issues.  Maternal grandmother had colon cancer. Patient works at [employer].  Interval History: Patient is feeling well at this time.  Her diarrhea is doing better on the colestipol.  For her bowel habits, she takes Imodium in the morning, followed by colestipol after an hour.  On this regimen she is having one BM per day. Her hemorrhoids are doing well. She only rarely has to use the Anusol HC cream. The cream does help with the rectal itching when she uses it. Reflux was doing well, though she will use Pepcid on occasion.   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   Osteoporosis      Past Surgical History:  Procedure Laterality Date   ABDOMINAL HYSTERECTOMY  2009   APPENDECTOMY Bilateral 2005   bowel removal  2005   5 inches   BOWEL RESECTION  2005   2 feet   BREAST BIOPSY  Right 04/29/2019   APOCRINE METAPLASIA    BREAST BIOPSY  02/03/2023   MM RT RADIOACTIVE SEED LOC MAMMO GUIDE 02/03/2023 [facility] M[facility]MO[facility]   BREAST LUMPECTOMY WITH RADIOACTIVE SEED LOCALIZATION Left 10/03/2021   Procedure: LEFT BREAST LUMPECTOMY WITH RADIOACTIVE SEED LOCALIZATION;  Surgeon: Griselda Miner, MD;  Location: [facility];  Service: General;  Laterality: Left;   BREAST LUMPECTOMY WITH RADIOACTIVE SEED LOCALIZATION Right 02/04/2023   Procedure: RIGHT BREAST LUMPECTOMY WITH RADIOACTIVE SEED LOCALIZATION;  Surgeon: Almond Lint, MD;  Location: [facility] OR;  Service: General;  Laterality: Right;   COLONOSCOPY     FRACTURE SURGERY     Collar Bone Left Plate placed   TONSILLECTOMY     As a child   Family History  Problem Relation Age of Onset   Breast cancer Mother        bilateral; dx 56, 77   Acute myelogenous leukemia Mother        dx late 68s   Pancreatic cancer Father 41   Colon cancer Maternal Grandmother 81   Uterine cancer Maternal Aunt    Breast cancer Maternal Aunt        dx after 50   Bladder Cancer Paternal Uncle        dx after 50   Esophageal  cancer Neg Hx    Rectal cancer Neg Hx    Stomach cancer Neg Hx    Social History   Tobacco Use   Smoking status: Every Day    Current packs/day: 1.00    Average packs/day: 1 pack/day for 17.0 years (17.0 ttl pk-yrs)    Types: Cigarettes   Smokeless tobacco: Never  Vaping Use   Vaping status: Never Used  Substance Use Topics   Alcohol use: Not Currently   Drug use: No   Current Outpatient Medications  Medication Sig Dispense Refill   acetaminophen (TYLENOL) 325 MG tablet Take 325-650 mg by mouth daily as needed (pain).     calcium carbonate (TUMS - DOSED IN MG ELEMENTAL CALCIUM) 500 MG chewable tablet Chew 1 tablet by mouth 2 (two) times daily as needed for heartburn or indigestion.     Calcium Carbonate-Vit D-Min (CALCIUM 1200 PO) Take 1 tablet by mouth in the morning and at bedtime.      cholecalciferol (VITAMIN D3) 25 MCG (1000 UNIT) tablet Take 1,000 Units by mouth in the morning.     clonazePAM (KLONOPIN) 0.5 MG tablet Take 0.5 mg by mouth 2 (two) times daily as needed for anxiety.     colestipol (COLESTID) 1 g tablet Take 4 tablets (4 g total) by mouth daily. 120 tablet 2   diazepam (VALIUM) 5 MG tablet Take 1 tablet by mouth 1 hour before procedure with very light food. May bring 2nd tablet to appointment. 2 tablet 0   escitalopram (LEXAPRO) 20 MG tablet Take 30 mg by mouth daily.     Eyelid Cleansers (AVENOVA EX) Apply 1 spray topically 2 (two) times daily as needed (eye debris.).     famotidine (PEPCID) 20 MG tablet Take 20 mg by mouth 2 (two) times daily.     fexofenadine (ALLEGRA) 180 MG tablet Take 180 mg by mouth daily as needed for allergies or rhinitis.     ibuprofen (ADVIL) 200 MG tablet Take 400-600 mg by mouth every 8 (eight) hours as needed (pain.).     ketoconazole (NIZORAL) 2 % cream Apply Externally twice a day 28 days (Patient taking differently: Apply 1 Application topically See admin instructions. One application up to twice daily for skin irritation (underarms).) 30 g 3   Lactobacillus (PROBIOTIC ACIDOPHILUS PO) Take 1 capsule by mouth in the morning.     loperamide (IMODIUM) 2 MG capsule Take 2 mg by mouth in the morning and at bedtime.     LORazepam (ATIVAN) 1 MG tablet Take 1 tablet (1 mg total) by mouth 2 (two) times daily as needed for anxiety. 10 tablet 0   metaxalone (SKELAXIN) 400 MG tablet Take 1 tablet (400 mg total) by mouth 3 (three) times daily as needed. (Patient taking differently: Take 200-400 mg by mouth 3 (three) times daily as needed.) 30 tablet 0   Multiple Vitamin (MULTIVITAMIN WITH MINERALS) TABS tablet Take 1 tablet by mouth in the morning.     SPRYCEL 140 MG tablet Take 1 tablet (140 mg total) by mouth daily. 30 tablet 3   Current Facility-Administered Medications  Medication Dose Route Frequency Provider Last Rate Last Admin   0.9 %   sodium chloride infusion  500 mL Intravenous Once Imogene Burn, MD       Allergies  Allergen Reactions   Other Other (See Comments)    Can't process certain food like lettuce, sesame seed.   Sulfa Antibiotics Swelling   Wellbutrin [Bupropion] Other (See Comments)  hyper   Zosyn [Piperacillin-Tazobactam In Dex] Rash     Review of Systems: All systems reviewed and negative except where noted in HPI.   Physical Exam: BP 110/60   Pulse 75   Ht 5\' 4"  (1.626 m)   Wt 146 lb (66.2 kg)   LMP 11/18/2012   BMI 25.06 kg/m  Constitutional: Pleasant,well-developed, female in no acute distress. HEENT: Normocephalic and atraumatic. Conjunctivae are normal. No scleral icterus. Cardiovascular: Normal rate, regular rhythm.  Pulmonary/chest: Effort normal and breath sounds normal. No wheezing, rales or rhonchi. Abdominal: Soft, nondistended, nontender. Bowel sounds active throughout. There are no masses palpable. No hepatomegaly. Extremities: No edema Neurological: Alert and oriented to person place and time. Skin: Skin is warm and dry. No rashes noted. Psychiatric: Normal mood and affect. Behavior is normal.  Labs 10/2022: CMP unremarkable  Labs 01/2023: CBC nml. BMP with mildly elevated Cr of 1.02.   Ab U/S 12/19/15: IMPRESSION: 1. No acute hepatobiliary abnormality is observed. No gallstones are demonstrated. If there are clinical concerns of gallbladder dysfunction, a nuclear medicine hepatobiliary scan may be useful. 2. 7 mm nonobstructing mid to lower pole right sided kidney stone. 3. No acute intra-abdominal abnormality is observed.  Barium enema 06/24/18: IMPRESSION: 1. No persistent polypoid lesion or constricting lesion is seen. There is some retained feces present particularly in the right colon making exclusion of small polyps difficult. 2. By history the terminal ileum has been resected, and the anastomosis of distal ileum with right colon is unremarkable with  no stricture evident. 3. The colon is elongated and tortuous as noted above.  RUQ U/S 02/28/21: IMPRESSION: Unremarkable right upper quadrant ultrasound  Colonoscopy 02/25/23: - The examined portion of the ileum was normal. - Patent side- to- side ileo- colonic anastomosis, characterized by healthy appearing mucosa. - Four 3 to 10 mm polyps in the rectum, in the sigmoid colon and in the transverse colon, removed with a cold snare. Resected and retrieved. - Diverticulosis in the sigmoid colon and in the descending colon. - Erythematous mucosa in the sigmoid colon. Biopsied. - Non- bleeding internal hemorrhoids. Path: 1. Surgical [P], colon, transverse, sigmoid, rectal, polyp (4) - TUBULAR ADENOMA, FRAGMENTS. 2. Surgical [P], colon nos, random sites - COLONIC MUCOSA WITH NO SIGNIFICANT PATHOLOGY.  ASSESSMENT AND PLAN: Colon cancer screening Prior SBO s/p ileocecectomy Diarrhea CHEK2 mutation Hemorrhoids GERD History of colon polyps Patient is overall doing well.  Colestipol has helped with her presumed bile acid diarrhea from prior ileocecectomy.  She still takes Imodium daily as well.  Her recent colonoscopy did show tubular adenomas that were removed.  Biopsies at that time did not show any signs of microscopic colitis.  Anusol HC did help with the patient's rectal itching due to hemorrhoids.  Patient is using Pepcid as needed to help with reflux at this time.  Patient is interested in tobacco cessation so will refer her to a tobacco cessation program. - Previously gave GERD handout - Stay well hydrated - Continue Pepcid 20 mg BID PRN - Continue Imodium daily - Continue colestipol 2 mg every day. Refill - Next colonoscopy is due in 02/2026 for polyp surveillance - Referral to tobacco cessation program - Follow up in 6 months  Eulah Pont, MD  I spent 35 minutes of time, including in depth chart review, independent review of results as outlined above, communicating results with the  patient directly, face-to-face time with the patient, coordinating care, and ordering studies and medications as appropriate, and documentation.

## 2023-05-28 NOTE — Patient Instructions (Addendum)
We have sent the following medications to your pharmacy for you to pick up at your convenience: Colestipol   Follow up in 6 months  Encourage drinking water to stay well hydrated   A referral was placed for to Smoking Cessation Program they will contact you to scheduled a appointment    _______________________________________________________  If your blood pressure at your visit was 140/90 or greater, please contact your primary care physician to follow up on this.  _______________________________________________________  If you are age 17 or older, your body mass index should be between 23-30. Your Body mass index is 25.06 kg/m. If this is out of the aforementioned range listed, please consider follow up with your Primary Care Provider.  If you are age 28 or younger, your body mass index should be between 19-25. Your Body mass index is 25.06 kg/m. If this is out of the aformentioned range listed, please consider follow up with your Primary Care Provider.   ________________________________________________________  The [facility] providers would like to encourage you to use [facility] to communicate with providers for non-urgent requests or questions.  Due to long hold times on the telephone, sending your provider a message by [facility] may be a faster and more efficient way to get a response.  Please allow 48 business hours for a response.  Please remember that this is for non-urgent requests.  _______________________________________________________  Thank you for entrusting me with your care and for choosing [facility], Dr. Eulah Pont

## 2023-05-29 ENCOUNTER — Encounter: Payer: Self-pay | Admitting: Orthopaedic Surgery

## 2023-05-29 ENCOUNTER — Other Ambulatory Visit (HOSPITAL_COMMUNITY): Payer: Self-pay

## 2023-05-29 ENCOUNTER — Other Ambulatory Visit (INDEPENDENT_AMBULATORY_CARE_PROVIDER_SITE_OTHER): Payer: BC Managed Care – PPO

## 2023-05-29 ENCOUNTER — Ambulatory Visit: Payer: BC Managed Care – PPO | Admitting: Orthopaedic Surgery

## 2023-05-29 DIAGNOSIS — G8929 Other chronic pain: Secondary | ICD-10-CM

## 2023-05-29 DIAGNOSIS — M25561 Pain in right knee: Secondary | ICD-10-CM

## 2023-05-29 MED ORDER — MELOXICAM 7.5 MG PO TABS
7.5000 mg | ORAL_TABLET | Freq: Two times a day (BID) | ORAL | 2 refills | Status: AC | PRN
  Filled 2023-05-29: qty 30, 15d supply, fill #0

## 2023-05-29 NOTE — Progress Notes (Unsigned)
Office Visit Note   Patient: [name]           Date of Birth: Aug 31, 1965           MRN: [id number] Visit Date: 05/29/2023              Requested by: Georgianne Fick, MD [address] [place],  [place] [address] PCP: Georgianne Fick, MD   Assessment & Plan: Visit Diagnoses:  1. Chronic pain of right knee     Plan: [name] is a 57 year old female with right knee pain.  Symptoms seem to correlate with patellar maltracking that was likely exacerbated by recent fall.  She has good function of the knee.  Recommend a home exercise strengthening program.  I have sent in prescription for meloxicam.  Recommend knee brace and Voltaren gel.  Follow-Up Instructions: No follow-ups on file.   Orders:  Orders Placed This Encounter  Procedures   XR KNEE 3 VIEW RIGHT   No orders of the defined types were placed in this encounter.     Procedures: No procedures performed   Clinical Data: No additional findings.   Subjective: Chief Complaint  Patient presents with   Right Knee - Pain    HPI [name] comes in today for evaluation of right knee pain for a month.  Fell directly on the knee on 04/18/2023.  She saw her PCP on the 30th.  She is reporting anterior knee pain.  She has been taking Tylenol ibuprofen.  She reports pain along the lateral patellar retinaculum.  Review of Systems  Constitutional: Negative.   HENT: Negative.    Eyes: Negative.   Respiratory: Negative.    Cardiovascular: Negative.   Endocrine: Negative.   Musculoskeletal: Negative.   Neurological: Negative.   Hematological: Negative.   Psychiatric/Behavioral: Negative.    All other systems reviewed and are negative.    Objective: Vital Signs: LMP 11/18/2012   Physical Exam Vitals and nursing note reviewed.  Constitutional:      Appearance: She is well-developed.  HENT:     Head: Atraumatic.     Nose: Nose normal.  Eyes:     Extraocular Movements: Extraocular  movements intact.  Cardiovascular:     Pulses: Normal pulses.  Pulmonary:     Effort: Pulmonary effort is normal.  Abdominal:     Palpations: Abdomen is soft.  Musculoskeletal:     Cervical back: Neck supple.  Skin:    General: Skin is warm.     Capillary Refill: Capillary refill takes less than 2 seconds.  Neurological:     Mental Status: She is alert. Mental status is at baseline.  Psychiatric:        Behavior: Behavior normal.        Thought Content: Thought content normal.        Judgment: Judgment normal.     Ortho Exam Exam of the right knee shows intact extensor mechanism.  There is no swelling or joint effusion.  Collaterals and cruciates are stable.  No joint line tenderness.  Normal range of motion. Specialty Comments:  Narrative & Impression CLINICAL DATA:  Lower back pain. Acute left-sided low back pain with left-sided sciatica.   EXAM: MRI LUMBAR SPINE WITHOUT CONTRAST   TECHNIQUE: Multiplanar, multisequence MR imaging of the lumbar spine was performed. No intravenous contrast was administered.   COMPARISON:  Radiographs Dec 17, 2021.   FINDINGS: Segmentation:  Standard.   Alignment: Levoconvex scoliosis. Small retrolisthesis at L1-2 and L2-3.  Vertebrae: No fracture, evidence of discitis, or bone lesion. Endplate degenerative changes at L2-3 and L4-5.   Conus medullaris and cauda equina: Conus extends to the L1 level. Conus and cauda equina appear normal.   Paraspinal and other soft tissues: Negative.   Disc levels:   T11-12: Small left central disc protrusion causing small indentation of the thecal sac without significant spinal canal or neural foraminal stenosis.   T12-L1: No spinal canal or neural foraminal stenosis.   L1-2: Loss of disc height, disc bulge and mild facet degenerative changes resulting in mild bilateral neural foraminal narrowing. No spinal canal stenosis.   L2-3: Loss of disc height, disc bulge and mild facet  degenerative changes resulting in mild bilateral neural foraminal narrowing. No significant spinal canal stenosis.   L3-4: Loss of disc height, right asymmetric disc bulge and mild facet degenerative change resulting in mild spinal canal stenosis with moderate right and mild left subarticular zone stenosis and mild right neural foraminal.   L4-5: Loss of disc height, disc bulge, mild right and moderate hypertrophic left facet degenerative changes resulting in mild right and moderate left subarticular zone stenosis, mild right and moderate left neural foraminal narrowing.   L5-S1: Shallow disc bulge and moderate facet degenerative changes without significant spinal canal or neural foraminal stenosis.   IMPRESSION: 1. 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 2. Moderate right subarticular zone stenosis and mild right neural foraminal narrowing at L3-4. 3. Mild bilateral neural foraminal narrowing at L1-2 and L2-3.     Electronically Signed   By: Baldemar Lenis M.D.   On: 05/25/2022 13:23  Imaging: XR KNEE 3 VIEW RIGHT  Result Date: 05/29/2023 X-rays of the right knee show no acute or structural abnormalities    PMFS History: Patient Active Problem List   Diagnosis Date Noted   Osteoporosis without current pathological fracture 05/22/2023   Chronic back pain 07/29/2022   Depression 11/18/2021   Osteoporosis 11/18/2021   Abnormal magnetic resonance imaging of left breast 08/06/2021   Diarrhea 05/24/2021   Genetic testing 05/21/2021   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   Preventive measure 04/23/2021   Family history of pancreatic cancer 03/20/2021   CML (chronic myeloid leukemia) (HCC) 03/01/2021   Family history of breast cancer 03/01/2021   Continuous dependence on cigarette smoking 03/01/2021   Past Medical History:  Diagnosis Date   Allergy    Anxiety    Arthritis     Cancer (HCC) 2024   Right Breast Cancer   Cancer (HCC) 2023   Left Breast Cancer   Cancer (HCC)    Chronic CML   Clotting disorder (HCC)    Depression    Family history of pancreatic cancer 03/20/2021   GERD (gastroesophageal reflux disease)    Headache    Hx   Hyperlipidemia    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   Osteoporosis     Family History  Problem Relation Age of Onset   Breast cancer Mother        bilateral; dx 73, 43   Acute myelogenous leukemia Mother        dx late 31s   Pancreatic cancer Father 35   Colon cancer Maternal Grandmother 81   Uterine cancer Maternal Aunt    Breast cancer Maternal Aunt        dx after 50   Bladder Cancer Paternal Uncle  dx after 50   Esophageal cancer Neg Hx    Rectal cancer Neg Hx    Stomach cancer Neg Hx     Past Surgical History:  Procedure Laterality Date   ABDOMINAL HYSTERECTOMY  2009   APPENDECTOMY Bilateral 2005   bowel removal  2005   5 inches   BOWEL RESECTION  2005   2 feet   BREAST BIOPSY Right 04/29/2019   APOCRINE METAPLASIA    BREAST BIOPSY  02/03/2023   MM RT RADIOACTIVE SEED LOC MAMMO GUIDE 02/03/2023 [facility] MAMMO[facility]AP[facility]   BREAST LUMPECTOMY WITH RADIOACTIVE SEED LOCALIZATION Left 10/03/2021   Procedure: LEFT BREAST LUMPECTOMY WITH RADIOACTIVE SEED LOCALIZATION;  Surgeon: Griselda Miner, MD;  Location: [facility];  Service: General;  Laterality: Left;   BREAST LUMPECTOMY WITH RADIOACTIVE SEED LOCALIZATION Right 02/04/2023   Procedure: RIGHT BREAST LUMPECTOMY WITH RADIOACTIVE SEED LOCALIZATION;  Surgeon: Almond Lint, MD;  Location: [facility] OR;  Service: General;  Laterality: Right;   COLONOSCOPY     FRACTURE SURGERY     Collar Bone Left Plate placed   TONSILLECTOMY     As a child   Social History   Occupational History   Occupation: retired   Occupation: IT sales professional  Tobacco Use   Smoking status: Every Day    Current packs/day: 1.00    Average  packs/day: 1 pack/day for 17.0 years (17.0 ttl pk-yrs)    Types: Cigarettes   Smokeless tobacco: Never  Vaping Use   Vaping status: Never Used  Substance and Sexual Activity   Alcohol use: Not Currently   Drug use: No   Sexual activity: Yes

## 2023-06-03 ENCOUNTER — Other Ambulatory Visit (HOSPITAL_COMMUNITY): Payer: Self-pay

## 2023-06-03 ENCOUNTER — Ambulatory Visit (INDEPENDENT_AMBULATORY_CARE_PROVIDER_SITE_OTHER): Payer: BC Managed Care – PPO

## 2023-06-03 VITALS — BP 118/66 | HR 68 | Temp 98.5°F | Resp 18 | Ht 64.0 in | Wt 147.0 lb

## 2023-06-03 DIAGNOSIS — M810 Age-related osteoporosis without current pathological fracture: Secondary | ICD-10-CM

## 2023-06-03 MED ORDER — DENOSUMAB 60 MG/ML ~~LOC~~ SOSY
60.0000 mg | PREFILLED_SYRINGE | Freq: Once | SUBCUTANEOUS | Status: AC
  Administered 2023-06-03: 60 mg via SUBCUTANEOUS
  Filled 2023-06-03: qty 1

## 2023-06-03 NOTE — Progress Notes (Signed)
Diagnosis: Osteoporosis  Provider:  Mannam, Praveen MD  Procedure: Injection  Prolia (Denosumab), Dose: 60 mg, Site: subcutaneous, Number of injections: 1  Post Care: Observation period completed  Discharge: Condition: Good, Destination: Home . AVS Provided  Performed by:  Barry Faircloth, RN       

## 2023-06-03 NOTE — Patient Instructions (Signed)
Denosumab Injection (Osteoporosis) What is this medication? DENOSUMAB (den oh SUE mab) prevents and treats osteoporosis. It works by making your bones stronger and less likely to break (fracture). It is a monoclonal antibody. This medicine may be used for other purposes; ask your health care provider or pharmacist if you have questions. COMMON BRAND NAME(S): Prolia What should I tell my care team before I take this medication? They need to know if you have any of these conditions: Dental or gum disease Had thyroid or parathyroid (glands located in neck) surgery Having dental surgery or a tooth pulled Kidney disease Low levels of calcium in the blood On dialysis Poor nutrition Thyroid disease Trouble absorbing nutrients from your food An unusual or allergic reaction to denosumab, other medications, foods, dyes, or preservatives Pregnant or trying to get pregnant Breastfeeding How should I use this medication? This medication is injected under the skin. It is given by your care team in a hospital or clinic setting. A special MedGuide will be given to you before each treatment. Be sure to read this information carefully each time. Talk to your care team about the use of this medication in children. Special care may be needed. Overdosage: If you think you have taken too much of this medicine contact a poison control center or emergency room at once. NOTE: This medicine is only for you. Do not share this medicine with others. What if I miss a dose? Keep appointments for follow-up doses. It is important not to miss your dose. Call your care team if you are unable to keep an appointment. What may interact with this medication? Do not take this medication with any of the following: Other medications that contain denosumab This medication may also interact with the following: Medications that lower your chance of fighting infection Steroid medications, such as prednisone or cortisone This  list may not describe all possible interactions. Give your health care provider a list of all the medicines, herbs, non-prescription drugs, or dietary supplements you use. Also tell them if you smoke, drink alcohol, or use illegal drugs. Some items may interact with your medicine. What should I watch for while using this medication? Your condition will be monitored carefully while you are receiving this medication. You may need blood work done while taking this medication. This medication may increase your risk of getting an infection. Call your care team for advice if you get a fever, chills, sore throat, or other symptoms of a cold or flu. Do not treat yourself. Try to avoid being around people who are sick. Tell your dentist and dental surgeon that you are taking this medication. You should not have major dental surgery while on this medication. See your dentist to have a dental exam and fix any dental problems before starting this medication. Take good care of your teeth while on this medication. Make sure you see your dentist for regular follow-up appointments. This medication may cause low levels of calcium in your body. The risk of severe side effects is increased in people with kidney disease. Your care team may prescribe calcium and vitamin D to help prevent low calcium levels while you take this medication. It is important to take calcium and vitamin D as directed by your care team. Talk to your care team if you may be pregnant. Serious birth defects may occur if you take this medication during pregnancy and for 5 months after the last dose. You will need a negative pregnancy test before starting this medication. Contraception   is recommended while taking this medication and for 5 months after the last dose. Your care team can help you find the option that works for you. Talk to your care team before breastfeeding. Changes to your treatment plan may be needed. What side effects may I notice from  receiving this medication? Side effects that you should report to your care team as soon as possible: Allergic reactions--skin rash, itching, hives, swelling of the face, lips, tongue, or throat Infection--fever, chills, cough, sore throat, wounds that don't heal, pain or trouble when passing urine, general feeling of discomfort or being unwell Low calcium level--muscle pain or cramps, confusion, tingling, or numbness in the hands or feet Osteonecrosis of the jaw--pain, swelling, or redness in the mouth, numbness of the jaw, poor healing after dental work, unusual discharge from the mouth, visible bones in the mouth Severe bone, joint, or muscle pain Skin infection--skin redness, swelling, warmth, or pain Side effects that usually do not require medical attention (report these to your care team if they continue or are bothersome): Back pain Headache Joint pain Muscle pain Pain in the hands, arms, legs, or feet Runny or stuffy nose Sore throat This list may not describe all possible side effects. Call your doctor for medical advice about side effects. You may report side effects to FDA at 1-800-FDA-1088. Where should I keep my medication? This medication is given in a hospital or clinic. It will not be stored at home. NOTE: This sheet is a summary. It may not cover all possible information. If you have questions about this medicine, talk to your doctor, pharmacist, or health care provider.  2024 Elsevier/Gold Standard (2022-08-12 00:00:00)  

## 2023-06-10 ENCOUNTER — Other Ambulatory Visit: Payer: Self-pay

## 2023-06-11 ENCOUNTER — Other Ambulatory Visit: Payer: Self-pay

## 2023-06-11 NOTE — Progress Notes (Signed)
Specialty Pharmacy Refill Coordination Note  [name] is a 57 y.o. female, patient responded to speciality pharmacy questionnaire, today regarding refills of specialty medication(s) Dasatinib   Patient requested [name] at [facility] [place] date: 06/15/23   Medication will be filled on 06/12/23.

## 2023-06-15 ENCOUNTER — Other Ambulatory Visit (HOSPITAL_COMMUNITY): Payer: Self-pay

## 2023-06-17 ENCOUNTER — Other Ambulatory Visit (HOSPITAL_COMMUNITY): Payer: Self-pay

## 2023-06-25 ENCOUNTER — Other Ambulatory Visit: Payer: Self-pay

## 2023-06-25 ENCOUNTER — Encounter: Payer: Self-pay | Admitting: Physical Therapy

## 2023-06-25 ENCOUNTER — Ambulatory Visit: Payer: BC Managed Care – PPO | Admitting: Physical Therapy

## 2023-06-25 DIAGNOSIS — R293 Abnormal posture: Secondary | ICD-10-CM

## 2023-06-25 DIAGNOSIS — M5416 Radiculopathy, lumbar region: Secondary | ICD-10-CM

## 2023-06-25 DIAGNOSIS — M6281 Muscle weakness (generalized): Secondary | ICD-10-CM

## 2023-06-25 NOTE — Therapy (Signed)
OUTPATIENT PHYSICAL THERAPY THORACOLUMBAR EVALUATION   Patient Name: [name] MRN: [id number] DOB:13-Jun-1966, 57 y.o., female Today's Date: 06/25/2023  END OF SESSION:  PT End of Session - 06/25/23 1240     Visit Number 1    Number of Visits 9    Date for PT Re-Evaluation 09/17/23    Authorization Type [employer] ($120 copay)    Authorization Time Period 06/25/23 to 09/17/23    PT Start Time 0932    PT Stop Time 1015    PT Time Calculation (min) 43 min    Activity Tolerance Patient tolerated treatment well;Patient limited by pain    Behavior During Therapy [facility] for tasks assessed/performed             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   Osteoporosis    Past Surgical History:  Procedure Laterality Date   ABDOMINAL HYSTERECTOMY  2009   APPENDECTOMY Bilateral 2005   bowel removal  2005   5 inches   BOWEL RESECTION  2005   2 feet   BREAST BIOPSY Right 04/29/2019   APOCRINE METAPLASIA    BREAST BIOPSY  02/03/2023   MM RT RADIOACTIVE SEED LOC MAMMO GUIDE 02/03/2023 [facility]MO[facility]   BREAST LUMPECTOMY WITH RADIOACTIVE SEED LOCALIZATION Left 10/03/2021   Procedure: LEFT BREAST LUMPECTOMY WITH RADIOACTIVE SEED LOCALIZATION;  Surgeon: Griselda Miner, MD;  Location: [facility];  Service: General;  Laterality: Left;   BREAST LUMPECTOMY WITH RADIOACTIVE SEED LOCALIZATION Right 02/04/2023   Procedure: RIGHT BREAST LUMPECTOMY WITH RADIOACTIVE SEED LOCALIZATION;  Surgeon: Almond Lint, MD;  Location: [facility] OR;  Service: General;  Laterality: Right;   COLONOSCOPY      FRACTURE SURGERY     Collar Bone Left Plate placed   TONSILLECTOMY     As a child   Patient Active Problem List   Diagnosis Date Noted   Osteoporosis without current pathological fracture 05/22/2023   Chronic back pain 07/29/2022   Depression 11/18/2021   Osteoporosis 11/18/2021   Abnormal magnetic resonance imaging of left breast 08/06/2021   Diarrhea 05/24/2021   Genetic testing 05/21/2021   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   Preventive measure 04/23/2021   Family history of pancreatic cancer 03/20/2021   CML (chronic myeloid leukemia) (HCC) 03/01/2021   Family history of breast cancer 03/01/2021   Continuous dependence on cigarette smoking 03/01/2021    PCP: Georgianne Fick MD   REFERRING PROVIDER: Arman Bogus, MD  REFERRING DIAG: Diagnosis M54.16 (ICD-10-CM) - Radiculopathy, lumbar region  Rationale for Evaluation and Treatment: Rehabilitation  THERAPY DIAG:  Abnormal posture  Muscle weakness (generalized)  Radiculopathy, lumbar region  ONSET DATE: September 2024  SUBJECTIVE:  SUBJECTIVE STATEMENT:  They found a bulging disc at L3 and it does not feel good; the MD is planning injections on December 13th and he wanted me to try PT as well. This is much worse than the back pain I had in the past, have to wake up in the middle of the night to take pain meds. Wasn't able to sleep well for about 3 weeks. Having pain going down my R leg wrapping around to front and side of LE, then down to side of knee. Bent over to pick up a piece of mail and felt radicular sx going into R LE right away, a week after that fell when R LE buckled on steps. Fearful of R LE buckling again especially when going down steps. Getting in car hurts due to having to lift and  swing leg, not able to mount horses right now either   PERTINENT HISTORY:  See above   PAIN:  Are you having pain? Yes: NPRS scale: 3 now, at absolute worst 12-13/10 Pain location: R LE Pain description: "can hurt so bad that I flinch", sharp/stab Aggravating factors: windows when pain killers have work off, bending over, movements similar to SLR/lifting knee up  Relieving factors: meds  PRECAUTIONS: Back, Fall, and Other: on active leukemia medicine  RED FLAGS: None   WEIGHT BEARING RESTRICTIONS: No  FALLS:  Has patient fallen in last 6 months? Yes. Number of falls 1- "soft fall" in June getting dog inside and R LE buckled on step just went with it so it wasn't a super hard fall. 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  DIAGNOSTIC FINDINGS:   X-rays of the right knee show no acute or structural abnormalities    Per paper referral- loss of disc space height at L2-3 with broad based  R disc protrusion  with inferior free fragment compressing R L3 nerve root.   PATIENT SURVEYS:  FOTO 46, predicted 68 in 10 visits     COGNITION: Overall cognitive status: Within functional limits for tasks assessed     SENSATION: Not tested  MUSCLE LENGTH:  Unable to really assess HS and piriformis on R due to high irritability of pain R LE  HS/Piriformis L WNL   POSTURE: rounded shoulders,  forward head, decreased lumbar lordosis, and flexed trunk   PALPATION: Lumbar spine very tight, tender at L3-5 B   LUMBAR ROM:   AROM eval  Flexion   Extension   Right lateral flexion   Left lateral flexion   Right rotation   Left rotation    (Blank rows = not tested)    LOWER EXTREMITY MMT:    MMT Right eval Left eval  Hip flexion 3+ 4  Hip extension 3+ 3+  Hip abduction 4+ 5  Hip adduction    Hip internal rotation    Hip external rotation    Knee flexion 3 4  Knee extension 4 4  Ankle  dorsiflexion 4+ 4+  Ankle plantarflexion    Ankle inversion    Ankle eversion     (Blank rows = not tested)  LUMBAR SPECIAL TESTS:  Straight leg raise test: Positive RLE    GAIT: Distance walked: in clinic distances  Assistive device utilized: None Level of assistance: Complete Independence Comments: antalgic, very stiff and favoring R side   TODAY'S TREATMENT:                                                                                                                              DATE:    06/25/23- eval, care planning, HEP, education as below  3 rounds of manual traction- improved pain/sx on first 2 rounds,  then some mild radicular sx on 3rd   Bridges x10 Standing lumbar extension x10    PATIENT EDUCATION:  Education details: exam findings, POC, HEP; general education on disc anatomy and "jelly donut" model as well that discs often heal naturally on their own with targeted exercises, benefit of extension based exercises in addressing problem, slow progressions with PT and HEP due to high irritability of pain at eval, will continue trial of manual traction instead of jumping to mechanical right away given some increased radicular symptoms with 3rd round of manual traction today Person educated: Patient Education method: Explanation, Demonstration, Tactile cues, and Handouts Education comprehension: verbalized understanding, returned demonstration, and needs further  education  HOME EXERCISE PROGRAM: Access Code: [email or web address] URL: [email or web address] Date: 06/25/2023 Prepared by: Nedra Hai  Exercises - Supine Bridge  - 1 x daily - 7 x weekly - 3 sets - 10 reps - Standing Lumbar Extension  - 1 x daily - 7 x weekly - 3 sets - 10 reps  ASSESSMENT:  CLINICAL IMPRESSION: Patient is a 57 y.o. F who was seen today for physical therapy evaluation and treatment for Diagnosis M54.16 (ICD-10-CM) - Radiculopathy, lumbar region. Pain was very high and very easily irritated at beginning of session- deferred many functional tests due to pain sensitivity and irritability. Really responded well to extension based exercises today as expected given known history of disc involvement per recent imaging. Tried several rounds of manual lumbar traction, had good results first two rounds then started getting increased radicular symptoms on 3rd round so stopped early- not sure if she will tolerate mechanical traction given this finding. Able to reduce pain to 0/10 at EOS. Will benefit from skilled PT services to address all findings and address pain. Unfortunately she has a very high co-pay which limits access to extended PT services, but fortunately she had an excellent response to skilled interventions at eval and anticipate she will progress nicely.   OBJECTIVE IMPAIRMENTS: Abnormal gait, decreased balance, decreased mobility, difficulty walking, decreased ROM, decreased strength, hypomobility, increased fascial restrictions, increased muscle spasms, impaired flexibility, impaired sensation, improper body mechanics, postural dysfunction, and pain.   ACTIVITY LIMITATIONS: carrying, lifting, bending, sitting, standing, stairs, transfers, bed mobility, and locomotion level  PARTICIPATION  LIMITATIONS: driving, shopping, community activity, occupation, and yard work  PERSONAL FACTORS: Age, Behavior pattern, Education, Fitness, Past/current experiences, Profession,  Social background, and Time since onset of injury/illness/exacerbation are also affecting patient's functional outcome.   REHAB POTENTIAL: Good  CLINICAL DECISION MAKING: Stable/uncomplicated  EVALUATION COMPLEXITY: Low   GOALS: Goals reviewed with patient? No  SHORT TERM GOALS: Target date: 07/23/2023    Will be compliant with appropriate progressive HEP  Baseline: Goal status: INITIAL  2.  Pain to be no more than 6/10 at worst  Baseline:  Goal status: INITIAL  3.  Radicular symptoms to have improved by 50% Baseline:  Goal status: INITIAL  4.  Will demonstrate good functional biomechanics for bed mobility and floor to waist height lifting  Baseline:  Goal status: INITIAL   LONG TERM GOALS: Target date: 09/17/2023    MMT to be at least 4+/5 in all tested groups  Baseline:  Goal status: INITIAL  2.  Low back pain to be no more than 3/10 at worst, and radicular symptoms will have completely resolved  Baseline:  Goal status: INITIAL  3.  Will score 22/24 on DGI to show minimal fall risk  Baseline:  Goal status: INITIAL  4.  Will be able to perform simulated work based repetitive tasks with no increase in back pain or flare of radicular symptoms  Baseline:  Goal status: INITIAL  5.  FOTO score to be within 5 points of predicted by time of DC  Baseline:  Goal status: INITIAL    PLAN:  PT FREQUENCY:  1x/week for 4 weeks, then drop to 1x/every other week for an additional 8 weeks (12 weeks total)  PT DURATION: 12 weeks  PLANNED INTERVENTIONS: 97164- PT Re-evaluation, 97110-Therapeutic exercises, 97530- Therapeutic activity, 97112- Neuromuscular re-education, 97535- Self Care, 16109- Manual therapy, [id number]- Gait training, [phone]- Orthotic Fit/training, [id number]- Aquatic Therapy, 97014- Electrical stimulation (unattended), [id number]- Ultrasound, [id number]- Traction (mechanical), Dry Needling, Joint mobilization, Spinal mobilization, Cryotherapy, and Moist heat.  PLAN FOR NEXT  SESSION: focus on extension based exercise programming, caution with mechanical traction (might benefit but may also aggravate pain as it was somewhat irritable just with manual traction at eval), core and LE strength. Visits limited by super high co-pay so need to build HEP as much as appropriate/tolerated.   Nedra Hai, PT, DPT 06/25/23 12:41 PM

## 2023-07-10 ENCOUNTER — Other Ambulatory Visit: Payer: Self-pay

## 2023-07-10 NOTE — Progress Notes (Signed)
Specialty Pharmacy Refill Coordination Note  [name] is a 57 y.o. female contacted today regarding refills of specialty medication(s) Dasatinib (Sprycel)   Patient requested (Patient-Rptd) Pickup at [facility] at [facility] date: (Patient-Rptd) 07/13/23   Medication will be filled on 12.20.24.

## 2023-07-14 ENCOUNTER — Other Ambulatory Visit (HOSPITAL_COMMUNITY): Payer: Self-pay

## 2023-07-20 ENCOUNTER — Encounter: Payer: Self-pay | Admitting: Physical Therapy

## 2023-07-20 ENCOUNTER — Ambulatory Visit: Payer: BC Managed Care – PPO | Admitting: Physical Therapy

## 2023-07-20 ENCOUNTER — Encounter: Payer: BC Managed Care – PPO | Admitting: Physical Therapy

## 2023-07-20 DIAGNOSIS — M5416 Radiculopathy, lumbar region: Secondary | ICD-10-CM

## 2023-07-20 DIAGNOSIS — M6281 Muscle weakness (generalized): Secondary | ICD-10-CM

## 2023-07-20 DIAGNOSIS — M5459 Other low back pain: Secondary | ICD-10-CM

## 2023-07-20 DIAGNOSIS — R293 Abnormal posture: Secondary | ICD-10-CM | POA: Diagnosis not present

## 2023-07-20 NOTE — Therapy (Signed)
OUTPATIENT PHYSICAL THERAPY THORACOLUMBAR   Patient Name: [name] MRN: [id number] DOB:[birth year], 57 y.o., female Today's Date: 07/20/2023  END OF SESSION:  PT End of Session - 07/20/23 1053     Visit Number 2    Number of Visits 9    Date for PT Re-Evaluation 09/17/23    Authorization Type [employer] ($120 copay)    Authorization Time Period 06/25/23 to 09/17/23    PT Start Time 0918    PT Stop Time 0948    PT Time Calculation (min) 30 min    Activity Tolerance Patient tolerated treatment well;Patient limited by pain    Behavior During Therapy [facility] for tasks assessed/performed              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   Osteoporosis    Past Surgical History:  Procedure Laterality Date   ABDOMINAL HYSTERECTOMY  2009   APPENDECTOMY Bilateral 2005   bowel removal  2005   5 inches   BOWEL RESECTION  2005   2 feet   BREAST BIOPSY Right 04/29/2019   APOCRINE METAPLASIA    BREAST BIOPSY  02/03/2023   MM RT RADIOACTIVE SEED LOC MAMMO GUIDE 02/03/2023 [facility]   BREAST LUMPECTOMY WITH RADIOACTIVE SEED LOCALIZATION Left 10/03/2021   Procedure: LEFT BREAST LUMPECTOMY WITH RADIOACTIVE SEED LOCALIZATION;  Surgeon: Griselda Miner, MD;  Location: [facility];  Service: General;  Laterality: Left;   BREAST LUMPECTOMY WITH RADIOACTIVE SEED LOCALIZATION Right 02/04/2023   Procedure: RIGHT BREAST LUMPECTOMY WITH RADIOACTIVE SEED LOCALIZATION;  Surgeon: Almond Lint, MD;  Location: [facility] OR;  Service: General;  Laterality: Right;   COLONOSCOPY     FRACTURE  SURGERY     Collar Bone Left Plate placed   TONSILLECTOMY     As a child   Patient Active Problem List   Diagnosis Date Noted   Osteoporosis without current pathological fracture 05/22/2023   Chronic back pain 07/29/2022   Depression 11/18/2021   Osteoporosis 11/18/2021   Abnormal magnetic resonance imaging of left breast 08/06/2021   Diarrhea 05/24/2021   Genetic testing 05/21/2021   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   Preventive measure 04/23/2021   Family history of pancreatic cancer 03/20/2021   CML (chronic myeloid leukemia) (HCC) 03/01/2021   Family history of breast cancer 03/01/2021   Continuous dependence on cigarette smoking 03/01/2021    PCP: Georgianne Fick MD   REFERRING PROVIDER: Arman Bogus, MD  REFERRING DIAG: Diagnosis M54.16 (ICD-10-CM) - Radiculopathy, lumbar region  Rationale for Evaluation and Treatment: Rehabilitation  THERAPY DIAG:  Abnormal posture  Muscle weakness (generalized)  Radiculopathy, lumbar region  Other low back pain  ONSET DATE: September 2024  SUBJECTIVE:  SUBJECTIVE STATEMENT: Pt reporting increased pain with hip flexion. Pt willing to try mechanical traction.     Eval:  They found a bulging disc at L3 and it does not feel good; the MD is planning injections on December 13th and he wanted me to try PT as well. This is much worse than the back pain I had in the past, have to wake up in the middle of the night to take pain meds. Wasn't able to sleep well for about 3 weeks. Having pain going down my R leg wrapping around to front and side of LE, then down to side of knee. Bent over to pick up a piece of mail and felt radicular sx going into R LE right away, a week after that fell when R LE buckled on steps.  Fearful of R LE buckling again especially when going down steps. Getting in car hurts due to having to lift and swing leg, not able to mount horses right now either   PERTINENT HISTORY:  See above   PAIN:  Are you having pain? 4/10 pain today in her Rt lumbar spine,    PRECAUTIONS: Back, Fall, and Other: on active leukemia medicine   RED FLAGS: None   WEIGHT BEARING RESTRICTIONS: No  FALLS:  Has patient fallen in last 6 months? Yes. Number of falls 1- "soft fall" in June getting dog inside and R LE buckled on step just went with it so it wasn't a super hard fall. 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  DIAGNOSTIC FINDINGS:   X-rays of the right knee show no acute or structural abnormalities  Per paper referral- loss of disc space height at L2-3 with broad based  R disc protrusion  with inferior free fragment compressing R L3 nerve root.   PATIENT SURVEYS:  EVAL: FOTO 46, predicted 68 in 10 visits   COGNITION: Overall cognitive status: Within functional limits for tasks assessed     SENSATION: Not tested  MUSCLE LENGTH:  Unable to really assess HS and piriformis on R due to high irritability of pain R LE  HS/Piriformis L WNL   POSTURE: rounded shoulders, forward head, decreased lumbar lordosis, and flexed trunk   PALPATION: Lumbar spine very tight, tender at L3-5 B   LUMBAR  ROM:   AROM eval  Flexion   Extension   Right lateral flexion   Left lateral flexion   Right rotation   Left rotation    (Blank rows = not tested)    LOWER EXTREMITY MMT:    MMT Right eval Left eval  Hip flexion 3+ 4  Hip extension 3+ 3+  Hip abduction 4+ 5  Hip adduction    Hip internal rotation    Hip external rotation    Knee flexion 3 4  Knee extension 4 4  Ankle dorsiflexion 4+ 4+  Ankle plantarflexion    Ankle inversion    Ankle eversion     (  Blank rows = not tested)  LUMBAR SPECIAL TESTS:  Straight leg raise test: Positive RLE    GAIT: Distance walked: in clinic distances  Assistive device utilized: None Level of assistance: Complete Independence Comments: antalgic, very stiff and favoring R side   TODAY'S TREATMENT:                                                                                                                              DATE:  07/20/23:  TherEx:  Attempted Nustep, pt was unable to tolerate increased hip flexion.  Pt's HEP was reviewed Squatting techniques discussed Modalities:  Mechanical Traction: 70# max pull, 55# minimal pull x 22 minutes      06/25/23- eval, care planning, HEP, education as below  3 rounds of manual traction- improved pain/sx on first 2 rounds,  then some mild radicular sx on 3rd   Bridges x10 Standing lumbar extension x10    PATIENT EDUCATION:  Education details: exam findings, POC, HEP; general education on disc anatomy and "jelly donut" model as well that discs often heal naturally on their own with targeted exercises, benefit of extension based exercises in addressing problem, slow progressions with PT and HEP due to high irritability of pain at eval, will continue trial of manual traction instead of jumping to mechanical right away given some increased radicular symptoms with 3rd round of manual traction today Person educated: Patient Education method: Explanation, Demonstration, Tactile cues, and  Handouts Education comprehension: verbalized understanding, returned demonstration, and needs further education  HOME EXERCISE PROGRAM: Access Code: [email or web address] URL: [email or web address] Date: 06/25/2023 Prepared by: Nedra Hai  Exercises - Supine Bridge  - 1 x daily - 7 x weekly - 3 sets - 10 reps - Standing Lumbar Extension  - 1 x daily - 7 x weekly - 3 sets - 10 reps  ASSESSMENT:  CLINICAL IMPRESSION: 07/20/23: Pt willing to try mechanical traction after a good response to manual traction last visit. Pt tolerated treatment well. Pt reporting leg length difference. A 1/4 inch heel lift was placed in pt;s left shoe for a trail basis. Pt was instructed in gradual increase in wear time to tolerance. Recommend continued skilled PT interventions to maximize pt's function.      Eval:  Patient is a 57 y.o. F who was seen today for physical therapy evaluation and treatment for Diagnosis M54.16 (ICD-10-CM) - Radiculopathy, lumbar region. Pain was very high and very easily irritated at beginning of session- deferred many functional tests due to pain sensitivity and irritability. Really responded well to extension based exercises today as expected given known history of disc involvement per recent imaging. Tried several rounds of manual lumbar traction, had good results first two rounds then started getting increased radicular symptoms on 3rd round so stopped early- not sure if she will tolerate mechanical traction given this finding. Able to reduce pain to 0/10 at EOS. Will benefit from skilled PT services to address all findings and address  pain. Unfortunately she has a very high co-pay which limits access to extended PT services, but fortunately she had an excellent response to skilled interventions at eval and anticipate she will progress nicely.   OBJECTIVE IMPAIRMENTS: Abnormal gait, decreased balance, decreased mobility, difficulty walking, decreased ROM, decreased strength,  hypomobility, increased fascial restrictions, increased muscle spasms, impaired flexibility, impaired sensation, improper body mechanics, postural dysfunction, and pain.   ACTIVITY LIMITATIONS: carrying, lifting, bending, sitting, standing, stairs, transfers, bed mobility, and locomotion level  PARTICIPATION LIMITATIONS: driving, shopping, community activity, occupation, and yard work  PERSONAL FACTORS: Age, Behavior pattern, Education, Fitness, Past/current experiences, Profession, Social background, and Time since onset of injury/illness/exacerbation are also affecting patient's functional outcome.   REHAB POTENTIAL: Good  CLINICAL DECISION MAKING: Stable/uncomplicated  EVALUATION COMPLEXITY: Low   GOALS: Goals reviewed with patient? No  SHORT TERM GOALS: Target date: 07/23/2023    Will be compliant with appropriate progressive HEP  Baseline: Goal status: on-going 07/20/23  2.  Pain to be no more than 6/10 at worst  Baseline:  Goal status:on-going 07/20/23  3.  Radicular symptoms to have improved by 50% Baseline:  Goal status: on-going 07/20/23  4.  Will demonstrate good functional biomechanics for bed mobility and floor to waist height lifting  Baseline:  Goal status: INITIAL   LONG TERM GOALS: Target date: 09/17/2023    MMT to be at least 4+/5 in all tested groups  Baseline:  Goal status: INITIAL  2.  Low back pain to be no more than 3/10 at worst, and radicular symptoms will have completely resolved  Baseline:  Goal status: INITIAL  3.  Will score 22/24 on DGI to show minimal fall risk  Baseline:  Goal status: INITIAL  4.  Will be able to perform simulated work based repetitive tasks with no increase in back pain or flare of radicular symptoms  Baseline:  Goal status: INITIAL  5.  FOTO score to be within 5 points of predicted by time of DC  Baseline:  Goal status: INITIAL    PLAN:  PT FREQUENCY:  1x/week for 4 weeks, then drop to 1x/every other week  for an additional 8 weeks (12 weeks total)  PT DURATION: 12 weeks  PLANNED INTERVENTIONS: 97164- PT Re-evaluation, 97110-Therapeutic exercises, 97530- Therapeutic activity, 97112- Neuromuscular re-education, 97535- Self Care, 81191- Manual therapy, [id number]- Gait training, [phone]- Orthotic Fit/training, [id number]- Aquatic Therapy, 97014- Electrical stimulation (unattended), [id number]- Ultrasound, [id number]- Traction (mechanical), Dry Needling, Joint mobilization, Spinal mobilization, Cryotherapy, and Moist heat.  PLAN FOR NEXT SESSION: focus on extension based exercise programming, , core and LE strength. Visits limited by super high co-pay so need to build HEP as much as appropriate/tolerated.   How did traction go?   Narda Amber, PT, MPT 07/20/23 10:55 AM   07/20/23 10:55 AM

## 2023-07-27 ENCOUNTER — Encounter: Payer: Self-pay | Admitting: Physical Therapy

## 2023-07-27 ENCOUNTER — Other Ambulatory Visit: Payer: Self-pay

## 2023-07-27 ENCOUNTER — Inpatient Hospital Stay: Payer: BC Managed Care – PPO | Attending: Hematology and Oncology

## 2023-07-27 ENCOUNTER — Ambulatory Visit: Payer: BC Managed Care – PPO | Admitting: Physical Therapy

## 2023-07-27 ENCOUNTER — Other Ambulatory Visit: Payer: Self-pay | Admitting: Hematology and Oncology

## 2023-07-27 ENCOUNTER — Other Ambulatory Visit: Payer: Self-pay | Admitting: Surgery

## 2023-07-27 DIAGNOSIS — R293 Abnormal posture: Secondary | ICD-10-CM | POA: Diagnosis not present

## 2023-07-27 DIAGNOSIS — C9210 Chronic myeloid leukemia, BCR/ABL-positive, not having achieved remission: Secondary | ICD-10-CM | POA: Insufficient documentation

## 2023-07-27 DIAGNOSIS — Z1231 Encounter for screening mammogram for malignant neoplasm of breast: Secondary | ICD-10-CM

## 2023-07-27 DIAGNOSIS — G8929 Other chronic pain: Secondary | ICD-10-CM

## 2023-07-27 DIAGNOSIS — M5459 Other low back pain: Secondary | ICD-10-CM | POA: Diagnosis not present

## 2023-07-27 DIAGNOSIS — Z803 Family history of malignant neoplasm of breast: Secondary | ICD-10-CM | POA: Insufficient documentation

## 2023-07-27 DIAGNOSIS — F17210 Nicotine dependence, cigarettes, uncomplicated: Secondary | ICD-10-CM | POA: Diagnosis not present

## 2023-07-27 DIAGNOSIS — M5416 Radiculopathy, lumbar region: Secondary | ICD-10-CM

## 2023-07-27 DIAGNOSIS — M6281 Muscle weakness (generalized): Secondary | ICD-10-CM

## 2023-07-27 LAB — COMPREHENSIVE METABOLIC PANEL
ALT: 26 U/L (ref 0–44)
AST: 27 U/L (ref 15–41)
Albumin: 4.1 g/dL (ref 3.5–5.0)
Alkaline Phosphatase: 76 U/L (ref 38–126)
Anion gap: 4 — ABNORMAL LOW (ref 5–15)
BUN: 14 mg/dL (ref 6–20)
CO2: 28 mmol/L (ref 22–32)
Calcium: 9.2 mg/dL (ref 8.9–10.3)
Chloride: 106 mmol/L (ref 98–111)
Creatinine, Ser: 0.95 mg/dL (ref 0.44–1.00)
GFR, Estimated: >60 mL/min (ref >60)
Glucose, Bld: 118 mg/dL — ABNORMAL HIGH (ref 70–99)
Potassium: 3.8 mmol/L (ref 3.5–5.1)
Sodium: 138 mmol/L (ref 135–145)
Total Bilirubin: 0.4 mg/dL (ref 0.0–1.2)
Total Protein: 6.2 g/dL — ABNORMAL LOW (ref 6.5–8.1)

## 2023-07-27 LAB — CBC WITH DIFFERENTIAL/PLATELET
Abs Immature Granulocytes: 0.02 10*3/uL (ref 0.00–0.07)
Basophils Absolute: 0.1 10*3/uL (ref 0.0–0.1)
Basophils Relative: 1 %
Eosinophils Absolute: 0.2 10*3/uL (ref 0.0–0.5)
Eosinophils Relative: 3 %
HCT: 38.3 % (ref 36.0–46.0)
Hemoglobin: 13.3 g/dL (ref 12.0–15.0)
Immature Granulocytes: 0 %
Lymphocytes Relative: 36 %
Lymphs Abs: 2.7 10*3/uL (ref 0.7–4.0)
MCH: 33.8 pg (ref 26.0–34.0)
MCHC: 34.7 g/dL (ref 30.0–36.0)
MCV: 97.5 fL (ref 80.0–100.0)
Monocytes Absolute: 0.3 10*3/uL (ref 0.1–1.0)
Monocytes Relative: 5 %
Neutro Abs: 4.3 10*3/uL (ref 1.7–7.7)
Neutrophils Relative %: 55 %
Platelets: 244 10*3/uL (ref 150–400)
RBC: 3.93 MIL/uL (ref 3.87–5.11)
RDW: 12.2 % (ref 11.5–15.5)
WBC: 7.6 10*3/uL (ref 4.0–10.5)
nRBC: 0 % (ref 0.0–0.2)

## 2023-07-27 NOTE — Therapy (Addendum)
 OUTPATIENT PHYSICAL THERAPY THORACOLUMBAR  Discharge  Patient Name: [name] MRN: [id number] DOB:03-23-1966, 58 y.o., female Today's Date: 07/27/2023  END OF SESSION:  PT End of Session - 07/27/23 1352     Visit Number 3    Number of Visits 9    Authorization Type [employer] ($120 copay)    Authorization Time Period 06/25/23 to 09/17/23    PT Start Time 1350    PT Stop Time 1430    PT Time Calculation (min) 40 min    Activity Tolerance Patient tolerated treatment well;Patient limited by pain    Behavior During Therapy [facility] for tasks assessed/performed               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   Osteoporosis    Past Surgical History:  Procedure Laterality Date   ABDOMINAL HYSTERECTOMY  2009   APPENDECTOMY Bilateral 2005   bowel removal  2005   5 inches   BOWEL RESECTION  2005   2 feet   BREAST BIOPSY Right 04/29/2019   APOCRINE METAPLASIA    BREAST BIOPSY  02/03/2023   MM RT RADIOACTIVE SEED LOC MAMMO GUIDE 02/03/2023 [facility] MAMMOGRAP[facility]   BREAST LUMPECTOMY WITH RADIOACTIVE SEED LOCALIZATION Left 10/03/2021   Procedure: LEFT BREAST LUMPECTOMY WITH RADIOACTIVE SEED LOCALIZATION;  Surgeon: Curvin Deward MOULD, MD;  Location: [facility];  Service: General;  Laterality: Left;   BREAST LUMPECTOMY WITH RADIOACTIVE SEED LOCALIZATION Right 02/04/2023   Procedure: RIGHT BREAST LUMPECTOMY WITH RADIOACTIVE SEED LOCALIZATION;  Surgeon: Aron Shoulders, MD;  Location: [facility] OR;  Service: General;  Laterality: Right;   COLONOSCOPY     FRACTURE SURGERY     Collar Bone Left  Plate placed   TONSILLECTOMY     As a child   Patient Active Problem List   Diagnosis Date Noted   Osteoporosis without current pathological fracture 05/22/2023   Chronic back pain 07/29/2022   Depression 11/18/2021   Osteoporosis 11/18/2021   Abnormal magnetic resonance imaging of left breast 08/06/2021   Diarrhea 05/24/2021   Genetic testing 05/21/2021   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   Preventive measure 04/23/2021   Family history of pancreatic cancer 03/20/2021   CML (chronic myeloid leukemia) (HCC) 03/01/2021   Family history of breast cancer 03/01/2021   Continuous dependence on cigarette smoking 03/01/2021    PCP: Verdia Lombard MD   REFERRING PROVIDER: Joshua Alm Hamilton, MD  REFERRING DIAG: Diagnosis M54.16 (ICD-10-CM) - Radiculopathy, lumbar region  Rationale for Evaluation and Treatment: Rehabilitation  THERAPY DIAG:  Abnormal posture  Muscle weakness (generalized)  Radiculopathy, lumbar region  Other low back pain  ONSET DATE: September 2024  SUBJECTIVE:  SUBJECTIVE STATEMENT: Pt reporting good response to traction last visit.     Eval:  They found a bulging disc at L3 and it does not feel good; the MD is planning injections on December 13th and he wanted me to try PT as well. This is much worse than the back pain I had in the past, have to wake up in the middle of the night to take pain meds. Wasn't able to sleep well for about 3 weeks. Having pain going down my R leg wrapping around to front and side of LE, then down to side of knee. Bent over to pick up a piece of mail and felt radicular sx going into R LE right away, a week after that fell when R LE buckled on steps. Fearful of R LE buckling again especially when going down steps.  Getting in car hurts due to having to lift and swing leg, not able to mount horses right now either   PERTINENT HISTORY:  See above   PAIN:  Are you having pain? 5-7/10 pain in her lumbar spine depending on activity level   PRECAUTIONS: Back, Fall, and Other: on active leukemia medicine   RED FLAGS: None   WEIGHT BEARING RESTRICTIONS: No  FALLS:  Has patient fallen in last 6 months? Yes. Number of falls 1- soft fall in June getting dog inside and R LE buckled on step just went with it so it wasn't a super hard fall. 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  DIAGNOSTIC FINDINGS:   X-rays of the right knee show no acute or structural abnormalities  Per paper referral- loss of disc space height at L2-3 with broad based  R disc protrusion  with inferior free fragment compressing R L3 nerve root.   PATIENT SURVEYS:  EVAL: FOTO 46, predicted 68 in 10 visits   COGNITION: Overall cognitive status: Within functional limits for tasks assessed     SENSATION: Not tested  MUSCLE LENGTH:  Unable to really assess HS and piriformis on R due to high irritability of pain R LE  HS/Piriformis L WNL   POSTURE: rounded shoulders, forward head, decreased lumbar lordosis, and flexed trunk   PALPATION: Lumbar spine very tight, tender at L3-5 B   LUMBAR ROM:   AROM eval 07/27/23  Flexion  65   Extension  18  Right lateral flexion  24  Left lateral flexion  12  Right rotation  WFL c pain  Left rotation  WFL c pain   (Blank rows = not tested)    LOWER EXTREMITY MMT:    MMT Right eval Left eval  Hip flexion 3+ 4  Hip extension 3+ 3+  Hip abduction 4+ 5  Hip adduction    Hip internal rotation    Hip external rotation    Knee flexion 3 4  Knee extension 4 4  Ankle dorsiflexion 4+ 4+  Ankle plantarflexion    Ankle inversion  Ankle eversion     (Blank rows = not tested)  LUMBAR SPECIAL TESTS:  Straight leg raise test: Positive RLE    GAIT: Distance walked: in clinic distances  Assistive device utilized: None Level of assistance: Complete Independence Comments: antalgic, very stiff and favoring R side   TODAY'S TREATMENT:                                                                                                                              DATE:  07/27/23:  TherEx:  ROM performed (see chart above) Wall extension with elbows on the wall ( x 10 holding 5-10 sec) Trunk rotation x 2  Modified cat/camel in standing position leaning over table/chair x 5  Modalities:  Mechanical Traction: 80# max pull, 60# minimal pull x 25 minutes     07/20/23:  TherEx:  Attempted Nustep, pt was unable to tolerate increased hip flexion.  Pt's HEP was reviewed Squatting techniques discussed Modalities:  Mechanical Traction: 70# max pull, 55# minimal pull x 22 minutes      06/25/23- eval, care planning, HEP, education as below  3 rounds of manual traction- improved pain/sx on first 2 rounds,  then some mild radicular sx on 3rd   Bridges x10 Standing lumbar extension x10    PATIENT EDUCATION:  Education details: exam findings, POC, HEP; general education on disc anatomy and jelly donut model as well that discs often heal naturally on their own with targeted exercises, benefit of extension based exercises in addressing problem, slow progressions with PT and  HEP due to high irritability of pain at eval, will continue trial of manual traction instead of jumping to mechanical right away given some increased radicular symptoms with 3rd round of manual traction today Person educated: Patient Education method: Explanation, Demonstration, Tactile cues, and Handouts Education comprehension: verbalized understanding, returned demonstration, and needs further education  HOME EXERCISE PROGRAM: Access Code: [email or web address] URL: [email or web address] Date: 07/27/2023 Prepared by: Delon Lunger  Exercises - Supine Bridge  - 1 x daily - 7 x weekly - 3 sets - 10 reps - Standing Lumbar Extension  - 1 x daily - 7 x weekly - 3 sets - 10 reps - Modified Cat Cow on Counter  - 1-2 x daily - 7 x weekly - 5 reps - 5 seconds hold - Standing Thoracic Extension at Wall  - 1-2 x daily - 7 x weekly - 10 reps - 5-10 seconds hold  ASSESSMENT:  CLINICAL IMPRESSION: Pt reporting good response  to mechanical traction last visit. Pt wishing to try again today. Pt was able to tolerate increased pull. Pt with good response to extension based exercises and pt's HEP was updated. Continue skilled PT to progress toward improved functional mobility.      Eval:  Patient is a 58 y.o. F who was seen today for physical therapy evaluation and treatment for Diagnosis M54.16 (ICD-10-CM) - Radiculopathy, lumbar region. Pain was very high and very easily  irritated at beginning of session- deferred many functional tests due to pain sensitivity and irritability. Really responded well to extension based exercises today as expected given known history of disc involvement per recent imaging. Tried several rounds of manual lumbar traction, had good results first two rounds then started getting increased radicular symptoms on 3rd round so stopped early- not sure if she will tolerate mechanical traction given this finding. Able to reduce pain to 0/10 at EOS. Will benefit from skilled PT  services to address all findings and address pain. Unfortunately she has a very high co-pay which limits access to extended PT services, but fortunately she had an excellent response to skilled interventions at eval and anticipate she will progress nicely.   OBJECTIVE IMPAIRMENTS: Abnormal gait, decreased balance, decreased mobility, difficulty walking, decreased ROM, decreased strength, hypomobility, increased fascial restrictions, increased muscle spasms, impaired flexibility, impaired sensation, improper body mechanics, postural dysfunction, and pain.   ACTIVITY LIMITATIONS: carrying, lifting, bending, sitting, standing, stairs, transfers, bed mobility, and locomotion level  PARTICIPATION LIMITATIONS: driving, shopping, community activity, occupation, and yard work  PERSONAL FACTORS: Age, Behavior pattern, Education, Fitness, Past/current experiences, Profession, Social background, and Time since onset of injury/illness/exacerbation are also affecting patient's functional outcome.   REHAB POTENTIAL: Good  CLINICAL DECISION MAKING: Stable/uncomplicated  EVALUATION COMPLEXITY: Low   GOALS: Goals reviewed with patient? No  SHORT TERM GOALS: Target date: 07/23/2023    Will be compliant with appropriate progressive HEP  Baseline: Goal status: on-going 07/20/23  2.  Pain to be no more than 6/10 at worst  Baseline:  Goal status:on-going 07/20/23  3.  Radicular symptoms to have improved by 50% Baseline:  Goal status: on-going 07/20/23  4.  Will demonstrate good functional biomechanics for bed mobility and floor to waist height lifting  Baseline:  Goal status: INITIAL   LONG TERM GOALS: Target date: 09/17/2023    MMT to be at least 4+/5 in all tested groups  Baseline:  Goal status: INITIAL  2.  Low back pain to be no more than 3/10 at worst, and radicular symptoms will have completely resolved  Baseline:  Goal status: INITIAL  3.  Will score 22/24 on DGI to show minimal fall  risk  Baseline:  Goal status: INITIAL  4.  Will be able to perform simulated work based repetitive tasks with no increase in back pain or flare of radicular symptoms  Baseline:  Goal status: INITIAL  5.  FOTO score to be within 5 points of predicted by time of DC  Baseline:  Goal status: INITIAL    PLAN:  PT FREQUENCY: 1x/week for 4 weeks, then drop to 1x/every other week for an additional 8 weeks (12 weeks total)  PT DURATION: 12 weeks  PLANNED INTERVENTIONS: 97164- PT Re-evaluation, 97110-Therapeutic exercises, 97530- Therapeutic activity, 97112- Neuromuscular re-education, 97535- Self Care, [id number]- Manual therapy, [id number]- Gait training, [id number]- Orthotic Fit/training, [id number]- Aquatic Therapy, 97014- Electrical stimulation (unattended), [id number]- Ultrasound, [id number]- Traction (mechanical), Dry Needling, Joint mobilization, Spinal mobilization, Cryotherapy, and Moist heat.  PLAN FOR NEXT SESSION: focus on extension based exercise programming, , core and LE strength. Visits limited by super high co-pay so need to build HEP as much as appropriate/tolerated.   How did traction go?   Delon Lunger, PT, MPT 07/27/23 4:15 PM   07/27/23 4:15 PM  PHYSICAL THERAPY DISCHARGE SUMMARY  Visits from Start of Care: 2  Current functional level related to goals / functional outcomes: See above, only 2 visits attended   Remaining deficits: See  above   Education / Equipment: HEP   Patient agrees to discharge. Patient goals were not met. Patient is being discharged due to not returning since the last visit.

## 2023-07-30 ENCOUNTER — Encounter: Payer: Self-pay | Admitting: Hematology and Oncology

## 2023-08-03 LAB — BCR-ABL1, CML/ALL, PCR, QUANT
E1A2 Transcript: <0.0032 %
b2a2 transcript: 0.0649 %
b3a2 transcript: <0.0032 %

## 2023-08-04 ENCOUNTER — Inpatient Hospital Stay: Payer: BC Managed Care – PPO | Admitting: Hematology and Oncology

## 2023-08-04 ENCOUNTER — Encounter: Payer: Self-pay | Admitting: Hematology and Oncology

## 2023-08-04 VITALS — BP 118/64 | HR 82 | Temp 100.1°F | Resp 18 | Ht 64.0 in

## 2023-08-04 DIAGNOSIS — Z803 Family history of malignant neoplasm of breast: Secondary | ICD-10-CM

## 2023-08-04 DIAGNOSIS — C9210 Chronic myeloid leukemia, BCR/ABL-positive, not having achieved remission: Secondary | ICD-10-CM

## 2023-08-04 DIAGNOSIS — F17210 Nicotine dependence, cigarettes, uncomplicated: Secondary | ICD-10-CM | POA: Diagnosis not present

## 2023-08-04 NOTE — Assessment & Plan Note (Addendum)
 I have reviewed documentation from [facility] She is scheduled for surgery end of the month We can discontinue surveillance imaging after that Recommend PT evaluation after mastectomy to prevent cording or lymphedema

## 2023-08-04 NOTE — Progress Notes (Signed)
 [facility] OFFICE PROGRESS NOTE  Patient Care Team: Verdia Lombard, MD as PCP - General (Internal Medicine)  ASSESSMENT & PLAN:  CML (chronic myeloid leukemia) (HCC) We reviewed test results Recent molecular study confirm major molecular response She will continue current dose of Sprycel  With her upcoming breast surgery, I recommend her to hold her treatment dose for 3 days prior to surgery and resume the day after  Family history of breast cancer I have reviewed documentation from [facility] She is scheduled for surgery end of the month We can discontinue surveillance imaging after that Recommend PT evaluation after mastectomy to prevent cording or lymphedema  Continuous dependence on cigarette smoking She is motivated and is attempting to quit smoking before her surgery  Orders Placed This Encounter  Procedures   Ambulatory referral to Physical Therapy    Referral Priority:   Routine    Referral Type:   Physical Medicine    Referral Reason:   Specialty Services Required    Requested Specialty:   Physical Therapy    Number of Visits Requested:   1    All questions were answered. The patient knows to call the clinic with any problems, questions or concerns. The total time spent in the appointment was 30 minutes encounter with patients including review of chart and various tests results, discussions about plan of care and coordination of care plan   Almarie Bedford, MD 08/04/2023 1:09 PM  INTERVAL HISTORY: Please see below for problem oriented charting. she returns for surveillance follow-up on Sprycel  She is tolerating treatment well No major side effects She is currently scheduled for surgery next week for prophylactic bilateral mastectomy due to strong family history of breast cancer She is attempting to quit smoking We discussed aftercare after bilateral mastectomy  REVIEW OF SYSTEMS:   Constitutional: Denies fevers, chills or abnormal weight loss Eyes:  Denies blurriness of vision Ears, nose, mouth, throat, and face: Denies mucositis or sore throat Respiratory: Denies cough, dyspnea or wheezes Cardiovascular: Denies palpitation, chest discomfort or lower extremity swelling Gastrointestinal:  Denies nausea, heartburn or change in bowel habits Skin: Denies abnormal skin rashes Lymphatics: Denies new lymphadenopathy or easy bruising Neurological:Denies numbness, tingling or new weaknesses Behavioral/Psych: Mood is stable, no new changes  All other systems were reviewed with the patient and are negative.  I have reviewed the past medical history, past surgical history, social history and family history with the patient and they are unchanged from previous note.  ALLERGIES:  is allergic to other, sulfa antibiotics, wellbutrin [bupropion], and zosyn [piperacillin-tazobactam in dex].  MEDICATIONS:  Current Outpatient Medications  Medication Sig Dispense Refill   acetaminophen  (TYLENOL ) 325 MG tablet Take 325-650 mg by mouth daily as needed (pain).     calcium carbonate (TUMS - DOSED IN MG ELEMENTAL CALCIUM) 500 MG chewable tablet Chew 1 tablet by mouth 2 (two) times daily as needed for heartburn or indigestion.     Calcium Carbonate-Vit D-Min (CALCIUM 1200 PO) Take 1 tablet by mouth in the morning and at bedtime.     cholecalciferol (VITAMIN D3) 25 MCG (1000 UNIT) tablet Take 1,000 Units by mouth in the morning.     clonazePAM (KLONOPIN) 0.5 MG tablet Take 0.5 mg by mouth 2 (two) times daily as needed for anxiety.     colestipol  (COLESTID ) 1 g tablet Take 4 tablets (4 g total) by mouth daily. 120 tablet 2   diazepam  (VALIUM ) 5 MG tablet Take 1 tablet by mouth 1 hour before procedure with  very light food. May bring 2nd tablet to appointment. 2 tablet 0   escitalopram  (LEXAPRO ) 20 MG tablet Take 30 mg by mouth daily.     Eyelid Cleansers (AVENOVA EX) Apply 1 spray topically 2 (two) times daily as needed (eye debris.).     famotidine (PEPCID) 20 MG  tablet Take 20 mg by mouth 2 (two) times daily.     fexofenadine (ALLEGRA) 180 MG tablet Take 180 mg by mouth daily as needed for allergies or rhinitis.     ibuprofen (ADVIL) 200 MG tablet Take 400-600 mg by mouth every 8 (eight) hours as needed (pain.).     ketoconazole  (NIZORAL ) 2 % cream Apply Externally twice a day 28 days (Patient taking differently: Apply 1 Application topically See admin instructions. One application up to twice daily for skin irritation (underarms).) 30 g 3   Lactobacillus (PROBIOTIC ACIDOPHILUS PO) Take 1 capsule by mouth in the morning.     loperamide (IMODIUM) 2 MG capsule Take 2 mg by mouth in the morning and at bedtime.     LORazepam  (ATIVAN ) 1 MG tablet Take 1 tablet (1 mg total) by mouth 2 (two) times daily as needed for anxiety. 10 tablet 0   meloxicam  (MOBIC ) 7.5 MG tablet Take 1 tablet (7.5 mg total) by mouth 2 (two) times daily as needed for pain. 30 tablet 2   metaxalone  (SKELAXIN ) 400 MG tablet Take 1 tablet (400 mg total) by mouth 3 (three) times daily as needed. (Patient taking differently: Take 200-400 mg by mouth 3 (three) times daily as needed.) 30 tablet 0   Multiple Vitamin (MULTIVITAMIN WITH MINERALS) TABS tablet Take 1 tablet by mouth in the morning.     SPRYCEL  140 MG tablet Take 1 tablet (140 mg total) by mouth daily. 30 tablet 3   No current facility-administered medications for this visit.    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HCC)  03/01/2021 Initial Diagnosis   CML (chronic myeloid leukemia) (HCC)   03/01/2021 Pathology Results   BCR/ABL by FISH: 91% positive BRC/ABL by PCR: e13a2 (b2a2) by IS is 196.685%   04/25/2021 -  Chemotherapy   She started taking imatinib    05/15/2021 Pathology Results   BRC/ABL is detected by PCR: e13a2 (b2a2) by IS is 70.731%   06/17/2021 Pathology Results   e13a2 (b2a2) by Quantidex (IS): 39.7401%   07/24/2021 Pathology Results   e13a2 (b2a2) by Quantidex (IS):  33.3%   08/05/2021 -  Chemotherapy   She started taking Sprycel     08/30/2021 Pathology Results   e13a2 (b2a2) by Quantidex (IS): 9.8004%   11/18/2021 Pathology Results   e13a2 (b2a2) by Quantidex (IS): 0.8987%   12/17/2021 Pathology Results   e13a2 (b2a2) by Quantidex (IS): 0.4764%   02/24/2022 Pathology Results   e13a2 (b2a2) by Quantidex (IS): 0.2635%   04/18/2022 Pathology Results   e13a2 (b2a2) by Quantidex (IS): 0.2739%    05/27/2022 Pathology Results   e13a2 (b2a2) by Quantidex (IS): 0.3033     07/16/2022 Pathology Results   e13a2 (b2a2) by Quantidex (IS): 0.1576%   10/20/2022 Pathology Results   e13a2 (b2a2) by Quantidex (IS): 0.1384%   02/02/2023 Pathology Results   13a2 (b2a2) by Quantidex (IS): 0.0313%    04/20/2023 Pathology Results   e13a2 (b2a2) by Quantidex (IS): 0.1316%    07/27/2023 Pathology Results   e13a2 (b2a2) by Quantidex (IS): 0.0649%   She is in MMR     PHYSICAL EXAMINATION: ECOG PERFORMANCE STATUS: 1 - Symptomatic  but completely ambulatory  Vitals:   08/04/23 1211  BP: 118/64  Pulse: 82  Resp: 18  Temp: 100.1 F (37.8 C)  SpO2: 96%   There were no vitals filed for this visit.  GENERAL:alert, no distress and comfortable   LABORATORY DATA:  I have reviewed the data as listed    Component Value Date/Time   NA 138 07/27/2023 1209   K 3.8 07/27/2023 1209   CL 106 07/27/2023 1209   CO2 28 07/27/2023 1209   GLUCOSE 118 (H) 07/27/2023 1209   BUN 14 07/27/2023 1209   CREATININE 0.95 07/27/2023 1209   CREATININE 0.95 07/18/2022 1206   CALCIUM 9.2 07/27/2023 1209   PROT 6.2 (L) 07/27/2023 1209   ALBUMIN 4.1 07/27/2023 1209   AST 27 07/27/2023 1209   AST 31 07/18/2022 1206   ALT 26 07/27/2023 1209   ALT 33 07/18/2022 1206   ALKPHOS 76 07/27/2023 1209   BILITOT 0.4 07/27/2023 1209   BILITOT 0.4 07/18/2022 1206   GFRNONAA >60 07/27/2023 1209   GFRNONAA >60 07/18/2022 1206   GFRAA >60 10/21/2019 1020    No results found for: SPEP,  UPEP  Lab Results  Component Value Date   WBC 7.6 07/27/2023   NEUTROABS 4.3 07/27/2023   HGB 13.3 07/27/2023   HCT 38.3 07/27/2023   MCV 97.5 07/27/2023   PLT 244 07/27/2023      Chemistry      Component Value Date/Time   NA 138 07/27/2023 1209   K 3.8 07/27/2023 1209   CL 106 07/27/2023 1209   CO2 28 07/27/2023 1209   BUN 14 07/27/2023 1209   CREATININE 0.95 07/27/2023 1209   CREATININE 0.95 07/18/2022 1206      Component Value Date/Time   CALCIUM 9.2 07/27/2023 1209   ALKPHOS 76 07/27/2023 1209   AST 27 07/27/2023 1209   AST 31 07/18/2022 1206   ALT 26 07/27/2023 1209   ALT 33 07/18/2022 1206   BILITOT 0.4 07/27/2023 1209   BILITOT 0.4 07/18/2022 1206

## 2023-08-04 NOTE — Assessment & Plan Note (Signed)
 We reviewed test results Recent molecular study confirm major molecular response She will continue current dose of Sprycel With her upcoming breast surgery, I recommend her to hold her treatment dose for 3 days prior to surgery and resume the day after

## 2023-08-04 NOTE — Assessment & Plan Note (Signed)
 She is motivated and is attempting to quit smoking before her surgery

## 2023-08-05 ENCOUNTER — Other Ambulatory Visit: Payer: Self-pay | Admitting: Hematology and Oncology

## 2023-08-05 ENCOUNTER — Encounter: Payer: Self-pay | Admitting: Oncology

## 2023-08-05 DIAGNOSIS — Z803 Family history of malignant neoplasm of breast: Secondary | ICD-10-CM

## 2023-08-05 NOTE — Progress Notes (Signed)
 Referral to PT entered for pre surgery assessment. [name] and she is scheduled to see PT on 08/10/23 before surgery.

## 2023-08-06 ENCOUNTER — Other Ambulatory Visit: Payer: Self-pay

## 2023-08-06 NOTE — Progress Notes (Signed)
Specialty Pharmacy Refill Coordination Note  [name] is a 58 y.o. female contacted today regarding refills of specialty medication(s) Dasatinib (Sprycel)   Patient requested [name] at [facility] at [place] date: 08/11/23   Medication will be filled on 08/10/23.

## 2023-08-10 ENCOUNTER — Ambulatory Visit: Payer: BC Managed Care – PPO | Admitting: Physical Therapy

## 2023-08-10 ENCOUNTER — Other Ambulatory Visit (HOSPITAL_COMMUNITY): Payer: Self-pay

## 2023-08-10 ENCOUNTER — Other Ambulatory Visit: Payer: Self-pay

## 2023-08-10 NOTE — Progress Notes (Signed)
Patient was left a voice mail regarding her copay. For a 30 day supply copay would be $1783.91, if reduced to a 15 day supply copay is covered at $0. The specialty pharmacy is pending a call back from patient and how she would like to proceed.

## 2023-08-10 NOTE — Progress Notes (Signed)
[facility] will fill 15 day supply until out of pocket max reached. Patient will pick up 1/21 or after.

## 2023-08-11 ENCOUNTER — Other Ambulatory Visit: Payer: Self-pay

## 2023-08-11 ENCOUNTER — Ambulatory Visit
Admission: RE | Admit: 2023-08-11 | Discharge: 2023-08-11 | Disposition: A | Discharge status: Home / Self Care | Payer: BC Managed Care – PPO | Source: Ambulatory Visit | Attending: Surgery | Admitting: Surgery

## 2023-08-11 DIAGNOSIS — Z1231 Encounter for screening mammogram for malignant neoplasm of breast: Secondary | ICD-10-CM

## 2023-08-12 ENCOUNTER — Other Ambulatory Visit (HOSPITAL_COMMUNITY): Payer: Self-pay

## 2023-08-12 HISTORY — PX: MASTECTOMY: SHX3

## 2023-08-12 MED ORDER — CLINDAMYCIN HCL 300 MG PO CAPS
300.0000 mg | ORAL_CAPSULE | Freq: Three times a day (TID) | ORAL | 0 refills | Status: DC
  Filled 2023-08-12: qty 15, 5d supply, fill #0

## 2023-08-12 MED ORDER — ONDANSETRON HCL 4 MG PO TABS
4.0000 mg | ORAL_TABLET | Freq: Three times a day (TID) | ORAL | 0 refills | Status: AC | PRN
  Filled 2023-08-12: qty 10, 4d supply, fill #0

## 2023-08-12 MED ORDER — HYDROCODONE-ACETAMINOPHEN 5-325 MG PO TABS
1.0000 | ORAL_TABLET | Freq: Four times a day (QID) | ORAL | 0 refills | Status: DC | PRN
  Filled 2023-08-12: qty 10, 3d supply, fill #0

## 2023-08-17 ENCOUNTER — Other Ambulatory Visit (HOSPITAL_COMMUNITY): Payer: Self-pay

## 2023-08-19 ENCOUNTER — Telehealth: Payer: Self-pay | Admitting: Physical Therapy

## 2023-08-19 ENCOUNTER — Encounter: Payer: BC Managed Care – PPO | Admitting: Physical Therapy

## 2023-08-19 NOTE — Telephone Encounter (Signed)
Called pt when she didn't show for her appt.  She states she tried to leave a voicemail yesterday.  She did just have major surgery (double mastectomy) so we will need medical clearance for pt to return to PT and any restrictions she may have.  Pt verbalized understanding.  Clarita Crane, PT, DPT 08/19/23 1:27 PM

## 2023-08-19 NOTE — Therapy (Incomplete)
OUTPATIENT PHYSICAL THERAPY THORACOLUMBAR   Patient Name: [name] MRN: [id number] DOB:[birth year], 58 y.o., female Today's Date: 08/19/2023  END OF SESSION:      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   Osteoporosis    Past Surgical History:  Procedure Laterality Date   ABDOMINAL HYSTERECTOMY  2009   APPENDECTOMY Bilateral 2005   bowel removal  2005   5 inches   BOWEL RESECTION  2005   2 feet   BREAST BIOPSY Right 04/29/2019   APOCRINE METAPLASIA    BREAST BIOPSY  02/03/2023   MM RT RADIOACTIVE SEED LOC MAMMO GUIDE 02/03/2023 [facility]   BREAST LUMPECTOMY WITH RADIOACTIVE SEED LOCALIZATION Left 10/03/2021   Procedure: LEFT BREAST LUMPECTOMY WITH RADIOACTIVE SEED LOCALIZATION;  Surgeon: Griselda Miner, MD;  Location: [facility];  Service: General;  Laterality: Left;   BREAST LUMPECTOMY WITH RADIOACTIVE SEED LOCALIZATION Right 02/04/2023   Procedure: RIGHT BREAST LUMPECTOMY WITH RADIOACTIVE SEED LOCALIZATION;  Surgeon: Almond Lint, MD;  Location: [facility];  Service: General;  Laterality: Right;   COLONOSCOPY     FRACTURE SURGERY     Collar Bone Left Plate placed   TONSILLECTOMY     As a child   Patient Active Problem List   Diagnosis Date Noted   Osteoporosis without current pathological fracture 05/22/2023   Chronic back pain 07/29/2022   Depression 11/18/2021   Osteoporosis 11/18/2021   Abnormal magnetic resonance imaging of left breast 08/06/2021   Diarrhea 05/24/2021   Genetic testing 05/21/2021   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  ONSET DATE: September 2024  SUBJECTIVE:  SUBJECTIVE STATEMENT: *** Pt reporting good response to traction last visit.     Eval:  They found a bulging disc at L3 and it does not feel good; the MD is planning injections on December 13th and he wanted me to try PT as well. This is much worse than the back pain I had in the past, have to wake up in the middle of the night to take pain meds. Wasn't able to sleep well for about 3 weeks. Having pain going down my R leg wrapping around to front and side of LE, then down to side of knee. Bent over to pick up a piece of mail and felt radicular sx going into R LE right away, a week after that fell when R LE buckled on steps. Fearful of R LE buckling again especially when going down steps. Getting in car hurts due to having to lift and swing leg, not able to mount horses right now either   PERTINENT HISTORY:  See above   PAIN:  Are you having pain? 5-7/10 pain in her lumbar spine depending on activity level   PRECAUTIONS: Back, Fall, and Other: on active leukemia medicine   RED FLAGS: None   WEIGHT BEARING RESTRICTIONS: No  FALLS:  Has patient fallen in last 6 months? Yes. Number of falls 1- "soft fall" in June getting dog inside and R LE buckled on step just  went with it so it wasn't a super hard fall. 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  DIAGNOSTIC FINDINGS:   X-rays of the right knee show no acute or structural abnormalities  Per paper referral- loss of disc space height at L2-3 with broad based  R disc protrusion  with inferior free fragment compressing R L3 nerve root.   PATIENT SURVEYS:  EVAL: FOTO 46, predicted 68 in 10 visits   COGNITION: Overall cognitive status: Within functional limits for tasks assessed     SENSATION: Not tested  MUSCLE LENGTH:  Unable to really assess HS and piriformis on R due to high irritability of pain R LE  HS/Piriformis L WNL   POSTURE: rounded shoulders, forward head, decreased lumbar lordosis, and flexed trunk   PALPATION: Lumbar spine very tight, tender at L3-5 B   LUMBAR ROM:   AROM eval 07/27/23  Flexion  65  Extension  18  Right lateral flexion  24  Left lateral flexion  12  Right rotation  WFL c pain  Left rotation  WFL c pain   (Blank rows = not tested)    LOWER EXTREMITY MMT:    MMT Right eval Left eval  Hip flexion 3+ 4  Hip extension 3+ 3+  Hip abduction 4+ 5  Hip adduction    Hip internal rotation    Hip external rotation    Knee flexion 3 4  Knee extension 4 4  Ankle dorsiflexion 4+ 4+  Ankle plantarflexion    Ankle inversion  Ankle eversion     (Blank rows = not  tested)  LUMBAR SPECIAL TESTS:  Straight leg raise test: Positive RLE    GAIT: Distance walked: in clinic distances  Assistive device utilized: None Level of assistance: Complete Independence Comments: antalgic, very stiff and favoring R side   TODAY'S TREATMENT:                                                                                                                              DATE:  08/19/23 ***  07/27/23:  TherEx:  ROM performed (see chart above) Wall extension with elbows on the wall ( x 10 holding 5-10 sec) Trunk rotation x 2  Modified cat/camel in standing position leaning over table/chair x 5  Modalities:  Mechanical Traction: 80# max pull, 60# minimal pull x 25 minutes     07/20/23:  TherEx:  Attempted Nustep, pt was unable to tolerate increased hip flexion.  Pt's HEP was reviewed Squatting techniques discussed Modalities:  Mechanical Traction: 70# max pull, 55# minimal pull x 22 minutes      06/25/23- eval, care planning, HEP, education as below  3 rounds of manual traction- improved pain/sx on first 2 rounds,  then some mild radicular sx on 3rd   Bridges x10 Standing lumbar extension x10    PATIENT EDUCATION:  Education details: exam findings, POC, HEP; general education on disc anatomy and "jelly donut" model as well that discs often heal naturally on their own with targeted exercises, benefit of extension based exercises in addressing problem, slow progressions with PT and HEP due to high irritability of pain at eval, will continue trial of manual traction instead of jumping to mechanical right away given some increased radicular symptoms with 3rd round of manual traction today Person educated: Patient Education method: Explanation, Demonstration, Tactile cues, and Handouts Education comprehension: verbalized understanding, returned demonstration, and needs further education  HOME EXERCISE PROGRAM: Access Code: [email or web address] URL:  [email or web address] Date: 07/27/2023 Prepared by: Narda Amber  Exercises - Supine Bridge  - 1 x daily - 7 x weekly - 3 sets - 10 reps - Standing Lumbar Extension  - 1 x daily - 7 x weekly - 3 sets - 10 reps - Modified Cat Cow on Counter  - 1-2 x daily - 7 x weekly - 5 reps - 5 seconds hold - Standing Thoracic Extension at Wall  - 1-2 x daily - 7 x weekly - 10 reps - 5-10 seconds hold  ASSESSMENT:  CLINICAL IMPRESSION: *** Pt reporting good response  to mechanical traction last visit. Pt wishing to try again today. Pt was able to tolerate increased pull. Pt with good response to extension based exercises and pt's HEP was updated. Continue skilled PT to progress toward improved functional mobility.      Eval:  Patient is a 58 y.o. F who was seen today for physical therapy evaluation and treatment for Diagnosis M54.16 (ICD-10-CM) - Radiculopathy, lumbar region. Pain was  very high and very easily irritated at beginning of session- deferred many functional tests due to pain sensitivity and irritability. Really responded well to extension based exercises today as expected given known history of disc involvement per recent imaging. Tried several rounds of manual lumbar traction, had good results first two rounds then started getting increased radicular symptoms on 3rd round so stopped early- not sure if she will tolerate mechanical traction given this finding. Able to reduce pain to 0/10 at EOS. Will benefit from skilled PT services to address all findings and address pain. Unfortunately she has a very high co-pay which limits access to extended PT services, but fortunately she had an excellent response to skilled interventions at eval and anticipate she will progress nicely.   OBJECTIVE IMPAIRMENTS: Abnormal gait, decreased balance, decreased mobility, difficulty walking, decreased ROM, decreased strength, hypomobility, increased fascial restrictions, increased muscle spasms,  impaired flexibility, impaired sensation, improper body mechanics, postural dysfunction, and pain.   ACTIVITY LIMITATIONS: carrying, lifting, bending, sitting, standing, stairs, transfers, bed mobility, and locomotion level  PARTICIPATION LIMITATIONS: driving, shopping, community activity, occupation, and yard work  PERSONAL FACTORS: Age, Behavior pattern, Education, Fitness, Past/current experiences, Profession, Social background, and Time since onset of injury/illness/exacerbation are also affecting patient's functional outcome.   REHAB POTENTIAL: Good  CLINICAL DECISION MAKING: Stable/uncomplicated  EVALUATION COMPLEXITY: Low   GOALS: Goals reviewed with patient? No  SHORT TERM GOALS: Target date: 07/23/2023    Will be compliant with appropriate progressive HEP  Baseline: Goal status: on-going 07/20/23  2.  Pain to be no more than 6/10 at worst  Baseline:  Goal status:on-going 07/20/23  3.  Radicular symptoms to have improved by 50% Baseline:  Goal status: on-going 07/20/23  4.  Will demonstrate good functional biomechanics for bed mobility and floor to waist height lifting  Baseline:  Goal status: INITIAL   LONG TERM GOALS: Target date: 09/17/2023    MMT to be at least 4+/5 in all tested groups  Baseline:  Goal status: INITIAL  2.  Low back pain to be no more than 3/10 at worst, and radicular symptoms will have completely resolved  Baseline:  Goal status: INITIAL  3.  Will score 22/24 on DGI to show minimal fall risk  Baseline:  Goal status: INITIAL  4.  Will be able to perform simulated work based repetitive tasks with no increase in back pain or flare of radicular symptoms  Baseline:  Goal status: INITIAL  5.  FOTO score to be within 5 points of predicted by time of DC  Baseline:  Goal status: INITIAL    PLAN:  PT FREQUENCY:  1x/week for 4 weeks, then drop to 1x/every other week for an additional 8 weeks (12 weeks total)  PT DURATION: 12  weeks  PLANNED INTERVENTIONS: 97164- PT Re-evaluation, 97110-Therapeutic exercises, 97530- Therapeutic activity, 97112- Neuromuscular re-education, 97535- Self Care, 16109- Manual therapy, [id number]- Gait training, [phone]- Orthotic Fit/training, [id number]- Aquatic Therapy, 97014- Electrical stimulation (unattended), [id number]- Ultrasound, [id number]- Traction (mechanical), Dry Needling, Joint mobilization, Spinal mobilization, Cryotherapy, and Moist heat.  PLAN FOR NEXT SESSION: *** focus on extension based exercise programming, , core and LE strength. Visits limited by super high co-pay so need to build HEP as much as appropriate/tolerated.   How did traction go?    Clarita Crane, PT, DPT 08/19/23 7:29 AM

## 2023-08-22 ENCOUNTER — Other Ambulatory Visit (HOSPITAL_COMMUNITY): Payer: Self-pay

## 2023-08-27 ENCOUNTER — Other Ambulatory Visit: Payer: Self-pay

## 2023-08-27 ENCOUNTER — Other Ambulatory Visit (HOSPITAL_COMMUNITY): Payer: Self-pay

## 2023-08-27 MED ORDER — DASATINIB 140 MG PO TABS
140.0000 mg | ORAL_TABLET | Freq: Every day | ORAL | 3 refills | Status: DC
  Filled 2023-08-27: qty 30, 30d supply, fill #0
  Filled 2023-08-31: qty 15, 15d supply, fill #0
  Filled 2023-08-31: qty 30, 30d supply, fill #0
  Filled 2023-09-28 (×2): qty 30, 30d supply, fill #1
  Filled 2023-10-20: qty 30, 30d supply, fill #2
  Filled 2023-12-02: qty 30, 30d supply, fill #3

## 2023-08-28 ENCOUNTER — Other Ambulatory Visit (HOSPITAL_COMMUNITY): Payer: Self-pay

## 2023-08-28 ENCOUNTER — Other Ambulatory Visit: Payer: Self-pay

## 2023-08-31 ENCOUNTER — Other Ambulatory Visit: Payer: Self-pay

## 2023-08-31 ENCOUNTER — Other Ambulatory Visit (HOSPITAL_COMMUNITY): Payer: Self-pay

## 2023-08-31 ENCOUNTER — Telehealth: Payer: Self-pay | Admitting: Pharmacy Technician

## 2023-08-31 NOTE — Telephone Encounter (Signed)
 Oral Oncology Patient Advocate Encounter  Received message from [facility] team that patient has questions regarding a letter from insurance about Sprycel  coverage.   Called and lvm with patient to discuss.  Paulette Borrow, CPhT-Adv Oncology Pharmacy Patient Advocate [facility] Direct Number: [phone]  Fax: [phone]

## 2023-08-31 NOTE — Progress Notes (Signed)
 Oral Oncology Patient Advocate Encounter  Patient's insurance requires generic. Patient aware and will fill 30 day supply going forward. Call schedule updated.  Paulette Borrow, CPhT-Adv Oncology Pharmacy Patient Advocate [facility] Direct Number: [phone]  Fax: [phone]

## 2023-08-31 NOTE — Progress Notes (Signed)
 Specialty Pharmacy Refill Coordination Note  [name] is a 58 y.o. female contacted today regarding refills of specialty medication(s) Dasatinib  (SPRYCEL )   Patient requested [place] [facility] at [facility] at [place] date: 09/03/23   Medication will be filled on 09/02/23.

## 2023-08-31 NOTE — Progress Notes (Signed)
 Specialty Pharmacy Ongoing Clinical Assessment Note  [name] is a 58 y.o. female who is being followed by the specialty pharmacy service for RxSp Oncology   Patient's specialty medication(s) reviewed today: Dasatinib  (SPRYCEL )   Missed doses in the last 4 weeks: 3   Patient/Caregiver did not have any additional questions or concerns.   Therapeutic benefit summary: Patient is achieving benefit   Adverse events/side effects summary: No adverse events/side effects   Patient's therapy is appropriate to: Continue    Goals Addressed             This Visit's Progress    Slow Disease Progression       Patient is on track. Patient will maintain adherence         Follow up:  6 months  [name] M Baer [place] Specialty Pharmacist

## 2023-08-31 NOTE — Telephone Encounter (Signed)
 Oral Oncology Patient Advocate Encounter  Followed up with patient. Will fill generic going forward  Paulette Borrow, CPhT-Adv Oncology Pharmacy Patient Advocate [facility] Direct Number: [phone]  Fax: [phone]

## 2023-09-01 ENCOUNTER — Other Ambulatory Visit: Payer: Self-pay

## 2023-09-01 ENCOUNTER — Other Ambulatory Visit (HOSPITAL_COMMUNITY): Payer: Self-pay

## 2023-09-01 ENCOUNTER — Encounter: Payer: Self-pay | Admitting: Physical Therapy

## 2023-09-01 ENCOUNTER — Ambulatory Visit: Payer: BC Managed Care – PPO | Attending: Hematology and Oncology | Admitting: Physical Therapy

## 2023-09-01 DIAGNOSIS — Z483 Aftercare following surgery for neoplasm: Secondary | ICD-10-CM | POA: Insufficient documentation

## 2023-09-01 DIAGNOSIS — Z803 Family history of malignant neoplasm of breast: Secondary | ICD-10-CM | POA: Diagnosis present

## 2023-09-01 DIAGNOSIS — R293 Abnormal posture: Secondary | ICD-10-CM | POA: Diagnosis present

## 2023-09-01 NOTE — Therapy (Signed)
OUTPATIENT PHYSICAL THERAPY  UPPER EXTREMITY ONCOLOGY EVALUATION  Patient Name: [name] MRN: [id number] DOB:1965-12-24, 58 y.o., female Today's Date: 09/01/2023  END OF SESSION:  PT End of Session - 09/01/23 1554     Visit Number 1    Number of Visits 5    Date for PT Re-Evaluation 09/29/23    Authorization Type [employer] ($120 copay)    PT Start Time 1502    PT Stop Time 1553    PT Time Calculation (min) 51 min    Activity Tolerance Patient tolerated treatment well    Behavior During Therapy [facility] for tasks assessed/performed             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   Osteoporosis    Past Surgical History:  Procedure Laterality Date   ABDOMINAL HYSTERECTOMY  2009   APPENDECTOMY Bilateral 2005   bowel removal  2005   5 inches   BOWEL RESECTION  2005   2 feet   BREAST BIOPSY Right 04/29/2019   APOCRINE METAPLASIA    BREAST BIOPSY  02/03/2023   MM RT RADIOACTIVE SEED LOC MAMMO GUIDE 02/03/2023 [facility]MO[facility]AP[facility]   BREAST LUMPECTOMY WITH RADIOACTIVE SEED LOCALIZATION Left 10/03/2021   Procedure: LEFT BREAST LUMPECTOMY WITH RADIOACTIVE SEED LOCALIZATION;  Surgeon: Natalie Miner, MD;  Location: [facility];  Service: General;  Laterality: Left;   BREAST LUMPECTOMY WITH RADIOACTIVE SEED LOCALIZATION Right 02/04/2023   Procedure: RIGHT BREAST LUMPECTOMY WITH RADIOACTIVE SEED LOCALIZATION;  Surgeon: Natalie Lint, MD;  [facility];  Service: General;  Laterality: Right;   COLONOSCOPY     FRACTURE SURGERY     Collar Bone Left Plate placed   TONSILLECTOMY      As a child   Patient Active Problem List   Diagnosis Date Noted   Osteoporosis without current pathological fracture 05/22/2023   Chronic back pain 07/29/2022   Depression 11/18/2021   Osteoporosis 11/18/2021   Abnormal magnetic resonance imaging of left breast 08/06/2021   Diarrhea 05/24/2021   Genetic testing 05/21/2021   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   Preventive measure 04/23/2021   Family history of pancreatic cancer 03/20/2021   CML (chronic myeloid leukemia) (HCC) 03/01/2021   Family history of breast cancer 03/01/2021   Continuous dependence on cigarette smoking 03/01/2021    PCP: Natalie Fick, MD  REFERRING PROVIDER: Artis Delay, MD  REFERRING DIAG: Z80.3 (ICD-10-CM) - Family history of breast cancer  THERAPY DIAG:  Aftercare following surgery for neoplasm  Abnormal posture  Family history of malignant neoplasm of breast  ONSET DATE: 08/12/23  Rationale for Evaluation and Treatment: Rehabilitation  SUBJECTIVE:  SUBJECTIVE STATEMENT: I have been worried about doing too much. I feel tight but I can raise my arms overhead. 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   PERTINENT HISTORY: Prophylactic total mastectomy on 08/12/23 with 0/1 lymph nodes on R, chronic myeloid leukemia, 2005- bowel resection 2005,  L breast lumpectomy 10/03/21, R breast lumpectomy 02/04/23 - both for complex sclerosing lesions, bulging disc at L3 with radiating pain  PAIN:  Are you having pain? Yes NPRS scale: 7/10 Pain location: radiating down R buttocks to R knee  Pain orientation: Right  PAIN TYPE: sharp, aching, stabbing Pain description: intermittent can be constant when medicine wears off  Aggravating factors: posture Relieving factors:  pain medication  PRECAUTIONS: Other: bulging disc at L3, lymphedema risk on R  RED FLAGS: Bulging disc at L3    WEIGHT BEARING RESTRICTIONS: No  FALLS:  Has patient fallen in last 6 months? Yes. Number of falls 1 Sept 29th 2024, stepping up and leg gave out  LIVING ENVIRONMENT: Lives with: lives alone Lives in: House/apartment Stairs: Yes; External: 1 steps; none Has following equipment at home: None  OCCUPATION: part time - retail  LEISURE: not currently exercising due to bulging disc  HAND DOMINANCE: right   PRIOR LEVEL OF FUNCTION: Independent  PATIENT GOALS: ROM and strengthening exercises   OBJECTIVE: Note: Objective measures were completed at Evaluation unless otherwise noted.  COGNITION: Overall cognitive status: Within functional limits for tasks assessed   PALPATION: Good skin mobility around scars  OBSERVATIONS / OTHER ASSESSMENTS: bilateral scars healing well   POSTURE: forward head and rounded shoulders  UPPER EXTREMITY AROM/PROM:  A/PROM RIGHT   eval   Shoulder extension 72  Shoulder flexion 170  Shoulder abduction 174  Shoulder internal rotation 59  Shoulder external rotation 95    (Blank rows = not tested)  A/PROM LEFT   eval  Shoulder extension 69  Shoulder flexion 168  Shoulder abduction 168  Shoulder internal rotation 62  Shoulder external rotation 95    (Blank rows = not tested)   UPPER EXTREMITY STRENGTH: 5/5  LYMPHEDEMA ASSESSMENTS:   LANDMARK RIGHT  eval  At axilla  30  10 cm proximal to olecranon process 25.8  Olecranon process 22.5  10 cm proximal to ulnar styloid process 20  Just proximal to ulnar styloid process 15.5  Across hand at thumb web space 18.5  At base of 2nd digit 6  (Blank rows = not tested)  LANDMARK LEFT  eval  At axilla  27.5  10 cm proximal to olecranon process 26.2  Olecranon process 22.3  10 cm proximal to ulnar styloid process 19.6  Just proximal to ulnar styloid process 15  Across hand  at thumb web space 17  At base of 2nd digit 5.6  (Blank rows = not tested)   QUICK DASH SURVEY:   [name] - 09/01/23 0001     Do heavy household chores (wash walls, wash floors) Mild difficulty    Carry a shopping bag or briefcase No difficulty    Wash your back No difficulty    Use a knife to cut food No difficulty    During the past week, to what extent has your arm, shoulder or hand problem interfered with your normal social activities with family, friends, neighbors, or groups? Slightly    During the past week, to what extent has your arm, shoulder or hand problem limited your work or other regular  daily activities Slightly    Arm, shoulder, or hand pain. None    Tingling (pins and needles) in your arm, shoulder, or hand None    Difficulty Sleeping No difficulty    DASH Score 2.27 %                                                                                                                                        TREATMENT DATE:  09/01/23- none today due to time constraints - see education section    PATIENT EDUCATION:  Education details: anatomy and physiology of the lymphatic system, need for stretching to decrease posture, basic posture exercises Person educated: Patient Education method: Medical illustrator Education comprehension: verbalized understanding  HOME EXERCISE PROGRAM: Work on end range stretching [employer]  ASSESSMENT:  CLINICAL IMPRESSION: Patient is a 58 y.o. female who was seen today for physical therapy evaluation and treatment for abnormal posture and end range shoulder tightness folloiwng bilateral prophylactic mastectomy on 08/12/23 with 1 R breast lymph node removed that was negative. Her ROM Is WFL but she does have some tightness at end range. 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 She would benefit from skilled PT services to learn how to decrease her risk of lymphedema and to instruct pt in a home  exercise program for continued stretching and strengthening and to improve posture.     OBJECTIVE IMPAIRMENTS: decreased knowledge of condition, increased fascial restrictions, and postural dysfunction.   ACTIVITY LIMITATIONS: lifting and reach over head  PARTICIPATION LIMITATIONS: occupation  PERSONAL FACTORS: Fitness and 1 comorbidity: has bulging disc at L3  are also affecting patient's functional outcome.   REHAB POTENTIAL: Good  CLINICAL DECISION MAKING: Evolving/moderate complexity  EVALUATION COMPLEXITY: Moderate  GOALS: Goals reviewed with patient? Yes  SHORT TERM GOALS=LONG TERM GOALS Target date: 09/29/23  Pt will be independent in Strength ABC program for long term stretching and strengthening.  Baseline: Goal status: INITIAL  2.  Pt will be able to verbalize lymphedema risk reduction practices to decrease risk of lymphedema.  Baseline:  Goal status: INITIAL   PLAN:  PT FREQUENCY: 1-2x/week  PT DURATION: 4 weeks  PLANNED INTERVENTIONS: 97164- PT Re-evaluation, 97110-Therapeutic exercises, 97530- Therapeutic activity, 97112- Neuromuscular re-education, 97535- Self Care, 16109- Manual therapy, [phone]- Orthotic Fit/training, Patient/Family education, Therapeutic exercises, Therapeutic activity, Neuromuscular re-education, and Self Care  PLAN FOR NEXT SESSION:  instruct in lymphedema risk reduction practices, begin Strength ABC program  [facility], PT 09/01/2023, 3:55 PM

## 2023-09-02 ENCOUNTER — Other Ambulatory Visit: Payer: Self-pay

## 2023-09-03 ENCOUNTER — Ambulatory Visit: Payer: Self-pay | Admitting: Physical Therapy

## 2023-09-03 ENCOUNTER — Encounter: Payer: Self-pay | Admitting: Physical Therapy

## 2023-09-03 DIAGNOSIS — Z483 Aftercare following surgery for neoplasm: Secondary | ICD-10-CM

## 2023-09-03 DIAGNOSIS — R293 Abnormal posture: Secondary | ICD-10-CM

## 2023-09-03 DIAGNOSIS — Z803 Family history of malignant neoplasm of breast: Secondary | ICD-10-CM

## 2023-09-03 NOTE — Therapy (Signed)
OUTPATIENT PHYSICAL THERAPY  UPPER EXTREMITY ONCOLOGY TREATMENT  Patient Name: [name] MRN: [id number] DOB:07-10-1966, 58 y.o., female Today's Date: 09/03/2023  END OF SESSION:  PT End of Session - 09/03/23 1206     Visit Number 2    Number of Visits 5    Date for PT Re-Evaluation 09/29/23    Authorization Type [employer]    Authorization Time Period [employer] Approved 11 visits- 09/01/2023-11/29/2023-author# [id number]    PT Start Time 1204    PT Stop Time 1256    PT Time Calculation (min) 52 min    Activity Tolerance Patient tolerated treatment well    Behavior During Therapy WFL for tasks assessed/performed             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   Osteoporosis    Past Surgical History:  Procedure Laterality Date   ABDOMINAL HYSTERECTOMY  2009   APPENDECTOMY Bilateral 2005   bowel removal  2005   5 inches   BOWEL RESECTION  2005   2 feet   BREAST BIOPSY Right 04/29/2019   APOCRINE METAPLASIA    BREAST BIOPSY  02/03/2023   MM RT RADIOACTIVE SEED LOC MAMMO GUIDE 02/03/2023 [facility]MO[facility]AP[facility]   BREAST LUMPECTOMY WITH RADIOACTIVE SEED LOCALIZATION Left 10/03/2021   Procedure: LEFT BREAST LUMPECTOMY WITH RADIOACTIVE SEED LOCALIZATION;  Surgeon: Griselda Miner, MD;  Location: [facility];  Service: General;  Laterality: Left;   BREAST LUMPECTOMY WITH RADIOACTIVE SEED LOCALIZATION Right 02/04/2023   Procedure: RIGHT BREAST LUMPECTOMY WITH RADIOACTIVE SEED LOCALIZATION;  Surgeon: Almond Lint, MD;  Location: [facility];  Service: General;  Laterality: Right;    COLONOSCOPY     FRACTURE SURGERY     Collar Bone Left Plate placed   TONSILLECTOMY     As a child   Patient Active Problem List   Diagnosis Date Noted   Osteoporosis without current pathological fracture 05/22/2023   Chronic back pain 07/29/2022   Depression 11/18/2021   Osteoporosis 11/18/2021   Abnormal magnetic resonance imaging of left breast 08/06/2021   Diarrhea 05/24/2021   Genetic testing 05/21/2021   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   Preventive measure 04/23/2021   Family history of pancreatic cancer 03/20/2021   CML (chronic myeloid leukemia) (HCC) 03/01/2021   Family history of breast cancer 03/01/2021   Continuous dependence on cigarette smoking 03/01/2021    PCP: Georgianne Fick, MD  REFERRING PROVIDER: Artis Delay, MD  REFERRING DIAG: Z80.3 (ICD-10-CM) - Family history of breast cancer  THERAPY DIAG:  Aftercare following surgery for neoplasm  Abnormal posture  Family history of malignant neoplasm of breast  ONSET DATE: 08/12/23  Rationale for Evaluation and Treatment: Rehabilitation  SUBJECTIVE:  SUBJECTIVE STATEMENT: I think the rain made my back pain worse.   PERTINENT HISTORY: Prophylactic total mastectomy on 08/12/23 with 0/1 lymph nodes on R, chronic myeloid leukemia, 2005- bowel resection 2005,  L breast lumpectomy 10/03/21, R breast lumpectomy 02/04/23 - both for complex sclerosing lesions, bulging disc at L3 with radiating pain  PAIN:  Are you having pain? Yes NPRS scale: 6/10 Pain location: radiating down R buttocks to R knee  Pain orientation: Right  PAIN TYPE: sharp, aching, stabbing Pain description: intermittent can be constant when medicine wears off  Aggravating factors: posture Relieving factors: pain  medication  PRECAUTIONS: Other: bulging disc at L3, lymphedema risk on R  RED FLAGS: Bulging disc at L3    WEIGHT BEARING RESTRICTIONS: No  FALLS:  Has patient fallen in last 6 months? Yes. Number of falls 1 Sept 29th 2024, stepping up and leg gave out  LIVING ENVIRONMENT: Lives with: lives alone Lives in: House/apartment Stairs: Yes; External: 1 steps; none Has following equipment at home: None  OCCUPATION: part time - retail  LEISURE: not currently exercising due to bulging disc  HAND DOMINANCE: right   PRIOR LEVEL OF FUNCTION: Independent  PATIENT GOALS: ROM and strengthening exercises   OBJECTIVE: Note: Objective measures were completed at Evaluation unless otherwise noted.  COGNITION: Overall cognitive status: Within functional limits for tasks assessed   PALPATION: Good skin mobility around scars  OBSERVATIONS / OTHER ASSESSMENTS: bilateral scars healing well   POSTURE: forward head and rounded shoulders  UPPER EXTREMITY AROM/PROM:  A/PROM RIGHT   eval   Shoulder extension 72  Shoulder flexion 170  Shoulder abduction 174  Shoulder internal rotation 59  Shoulder external rotation 95    (Blank rows = not tested)  A/PROM LEFT   eval  Shoulder extension 69  Shoulder flexion 168  Shoulder abduction 168  Shoulder internal rotation 62  Shoulder external rotation 95    (Blank rows = not tested)   UPPER EXTREMITY STRENGTH: 5/5  LYMPHEDEMA ASSESSMENTS:   LANDMARK RIGHT  eval  At axilla  30  10 cm proximal to olecranon process 25.8  Olecranon process 22.5  10 cm proximal to ulnar styloid process 20  Just proximal to ulnar styloid process 15.5  Across hand at thumb web space 18.5  At base of 2nd digit 6  (Blank rows = not tested)  LANDMARK LEFT  eval  At axilla  27.5  10 cm proximal to olecranon process 26.2  Olecranon process 22.3  10 cm proximal to ulnar styloid process 19.6  Just proximal to ulnar styloid process 15  Across hand at  thumb web space 17  At base of 2nd digit 5.6  (Blank rows = not tested)   QUICK DASH SURVEY:                                                                                                                                TREATMENT DATE:  09/03/23:  Instructed pt in lymphedema risk reduction practices and issued handout. Educated pt that her risk is very low since she only had one breast lymph node removed Instructed pt in Strength ABC program today through squats - pt held all stretches for 20 sec and then completed all exercises at 10 reps and used 1 lbs for resistance. Educated pt about importance of core muscles to protect back and not to do any exercises that cause pain. Pt had pain with super woman so educated her not to this one at home. Gave v/c for proper form for all exercises.  09/01/23- none today due to time constraints - see education section    PATIENT EDUCATION:  Education details: anatomy and physiology of the lymphatic system, need for stretching to decrease posture, basic posture exercises Person educated: Patient Education method: Medical illustrator Education comprehension: verbalized understanding  HOME EXERCISE PROGRAM: Work on end range stretching [employer]  ASSESSMENT:  CLINICAL IMPRESSION: Educated pt in lymphedema risk reduction practices today and issued handout. Educated pt that her risk is very low since she only had one breast lymph node removed. Began instructing pt in Strength ABC program and pt return demo each exercise. She only had pain with superwoman exercise so educated her not to do that one at home. Substituted pelvic tilts for crunches to improve core strength.    OBJECTIVE IMPAIRMENTS: decreased knowledge of condition, increased fascial restrictions, and postural dysfunction.   ACTIVITY LIMITATIONS: lifting and reach over head  PARTICIPATION LIMITATIONS: occupation  PERSONAL FACTORS: Fitness and 1 comorbidity: has bulging  disc at L3  are also affecting patient's functional outcome.   REHAB POTENTIAL: Good  CLINICAL DECISION MAKING: Evolving/moderate complexity  EVALUATION COMPLEXITY: Moderate  GOALS: Goals reviewed with patient? Yes  SHORT TERM GOALS=LONG TERM GOALS Target date: 09/29/23  Pt will be independent in Strength ABC program for long term stretching and strengthening.  Baseline: Goal status: INITIAL  2.  Pt will be able to verbalize lymphedema risk reduction practices to decrease risk of lymphedema.  Baseline:  Goal status: INITIAL   PLAN:  PT FREQUENCY: 1-2x/week  PT DURATION: 4 weeks  PLANNED INTERVENTIONS: 97164- PT Re-evaluation, 97110-Therapeutic exercises, 97530- Therapeutic activity, 97112- Neuromuscular re-education, 97535- Self Care, 29562- Manual therapy, [phone]- Orthotic Fit/training, Patient/Family education, Therapeutic exercises, Therapeutic activity, Neuromuscular re-education, and Self Care  PLAN FOR NEXT SESSION:  cont Strength ABC program beginning at deadlift, instruct in pec stretches over foam roll  [employer], PT 09/03/2023, 12:59 PM

## 2023-09-07 ENCOUNTER — Encounter: Payer: Self-pay | Admitting: Internal Medicine

## 2023-09-09 ENCOUNTER — Encounter: Payer: BC Managed Care – PPO | Admitting: Rehabilitation

## 2023-09-10 ENCOUNTER — Ambulatory Visit: Payer: BC Managed Care – PPO | Admitting: Rehabilitation

## 2023-09-16 ENCOUNTER — Other Ambulatory Visit (HOSPITAL_COMMUNITY): Payer: Self-pay

## 2023-09-17 ENCOUNTER — Ambulatory Visit: Payer: BC Managed Care – PPO | Admitting: Physical Therapy

## 2023-09-17 ENCOUNTER — Other Ambulatory Visit: Payer: Self-pay

## 2023-09-18 ENCOUNTER — Other Ambulatory Visit (HOSPITAL_COMMUNITY): Payer: Self-pay

## 2023-09-18 ENCOUNTER — Other Ambulatory Visit: Payer: Self-pay | Admitting: Internal Medicine

## 2023-09-18 MED ORDER — COLESTIPOL HCL 1 G PO TABS
4.0000 g | ORAL_TABLET | Freq: Every day | ORAL | 2 refills | Status: DC
  Filled 2023-09-18: qty 120, 30d supply, fill #0
  Filled 2023-10-20: qty 120, 30d supply, fill #1
  Filled 2023-11-18: qty 120, 30d supply, fill #2

## 2023-09-22 ENCOUNTER — Other Ambulatory Visit (HOSPITAL_COMMUNITY): Payer: Self-pay

## 2023-09-23 ENCOUNTER — Encounter: Payer: Self-pay | Admitting: Physical Therapy

## 2023-09-23 ENCOUNTER — Ambulatory Visit: Payer: BC Managed Care – PPO | Attending: Hematology and Oncology | Admitting: Physical Therapy

## 2023-09-23 DIAGNOSIS — Z803 Family history of malignant neoplasm of breast: Secondary | ICD-10-CM | POA: Diagnosis present

## 2023-09-23 DIAGNOSIS — Z483 Aftercare following surgery for neoplasm: Secondary | ICD-10-CM | POA: Diagnosis present

## 2023-09-23 DIAGNOSIS — R293 Abnormal posture: Secondary | ICD-10-CM | POA: Diagnosis present

## 2023-09-23 NOTE — Therapy (Signed)
 OUTPATIENT PHYSICAL THERAPY  UPPER EXTREMITY ONCOLOGY TREATMENT  Patient Name: [name] MRN: [id number] DOB:1966-01-29, 58 y.o., female Today's Date: 09/23/2023  END OF SESSION:  PT End of Session - 09/23/23 1109     Visit Number 3    Number of Visits 5    Date for PT Re-Evaluation 09/29/23    Authorization Type [employer]    Authorization Time Period [employer] Approved 11 visits- 09/01/2023-11/29/2023-author# [id number]    PT Start Time 1105    PT Stop Time 1153    PT Time Calculation (min) 48 min    Activity Tolerance Patient tolerated treatment well    Behavior During Therapy WFL for tasks assessed/performed             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   Osteoporosis    Past Surgical History:  Procedure Laterality Date   ABDOMINAL HYSTERECTOMY  2009   APPENDECTOMY Bilateral 2005   bowel removal  2005   5 inches   BOWEL RESECTION  2005   2 feet   BREAST BIOPSY Right 04/29/2019   APOCRINE METAPLASIA    BREAST BIOPSY  02/03/2023   MM RT RADIOACTIVE SEED LOC MAMMO GUIDE 02/03/2023 [facility] MAMMOGRAP[facility]   BREAST LUMPECTOMY WITH RADIOACTIVE SEED LOCALIZATION Left 10/03/2021   Procedure: LEFT BREAST LUMPECTOMY WITH RADIOACTIVE SEED LOCALIZATION;  Surgeon: Griselda Miner, MD;  Location: [facility];  Service: General;  Laterality: Left;   BREAST LUMPECTOMY WITH RADIOACTIVE SEED LOCALIZATION Right 02/04/2023   Procedure: RIGHT BREAST LUMPECTOMY WITH RADIOACTIVE SEED LOCALIZATION;  Surgeon: Almond Lint, MD;  Location: [facility];  Service: General;  Laterality: Right;    COLONOSCOPY     FRACTURE SURGERY     Collar Bone Left Plate placed   TONSILLECTOMY     As a child   Patient Active Problem List   Diagnosis Date Noted   Osteoporosis without current pathological fracture 05/22/2023   Chronic back pain 07/29/2022   Depression 11/18/2021   Osteoporosis 11/18/2021   Abnormal magnetic resonance imaging of left breast 08/06/2021   Diarrhea 05/24/2021   Genetic testing 05/21/2021   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   Preventive measure 04/23/2021   Family history of pancreatic cancer 03/20/2021   CML (chronic myeloid leukemia) (HCC) 03/01/2021   Family history of breast cancer 03/01/2021   Continuous dependence on cigarette smoking 03/01/2021    PCP: Georgianne Fick, MD  REFERRING PROVIDER: Artis Delay, MD  REFERRING DIAG: Z80.3 (ICD-10-CM) - Family history of breast cancer  THERAPY DIAG:  Aftercare following surgery for neoplasm  Abnormal posture  Family history of malignant neoplasm of breast  ONSET DATE: 08/12/23  Rationale for Evaluation and Treatment: Rehabilitation  SUBJECTIVE:  SUBJECTIVE STATEMENT: I have not done the exercises since I was here last. I am terrible at physical therapy.    PERTINENT HISTORY: Prophylactic total mastectomy on 08/12/23 with 0/1 lymph nodes on R, chronic myeloid leukemia, 2005- bowel resection 2005,  L breast lumpectomy 10/03/21, R breast lumpectomy 02/04/23 - both for complex sclerosing lesions, bulging disc at L3 with radiating pain  PAIN:  Are you having pain? No  PRECAUTIONS: Other: bulging disc at L3, lymphedema risk on R  RED FLAGS: Bulging disc at L3    WEIGHT BEARING RESTRICTIONS: No  FALLS:  Has patient fallen in last 6 months? Yes. Number of falls 1 Sept 29th 2024, stepping up  and leg gave out  LIVING ENVIRONMENT: Lives with: lives alone Lives in: House/apartment Stairs: Yes; External: 1 steps; none Has following equipment at home: None  OCCUPATION: part time - retail  LEISURE: not currently exercising due to bulging disc  HAND DOMINANCE: right   PRIOR LEVEL OF FUNCTION: Independent  PATIENT GOALS: ROM and strengthening exercises   OBJECTIVE: Note: Objective measures were completed at Evaluation unless otherwise noted.  COGNITION: Overall cognitive status: Within functional limits for tasks assessed   PALPATION: Good skin mobility around scars  OBSERVATIONS / OTHER ASSESSMENTS: bilateral scars healing well   POSTURE: forward head and rounded shoulders  UPPER EXTREMITY AROM/PROM:  A/PROM RIGHT   eval   Shoulder extension 72  Shoulder flexion 170  Shoulder abduction 174  Shoulder internal rotation 59  Shoulder external rotation 95    (Blank rows = not tested)  A/PROM LEFT   eval  Shoulder extension 69  Shoulder flexion 168  Shoulder abduction 168  Shoulder internal rotation 62  Shoulder external rotation 95    (Blank rows = not tested)   UPPER EXTREMITY STRENGTH: 5/5  LYMPHEDEMA ASSESSMENTS:   LANDMARK RIGHT  eval  At axilla  30  10 cm proximal to olecranon process 25.8  Olecranon process 22.5  10 cm proximal to ulnar styloid process 20  Just proximal to ulnar styloid process 15.5  Across hand at thumb web space 18.5  At base of 2nd digit 6  (Blank rows = not tested)  LANDMARK LEFT  eval  At axilla  27.5  10 cm proximal to olecranon process 26.2  Olecranon process 22.3  10 cm proximal to ulnar styloid process 19.6  Just proximal to ulnar styloid process 15  Across hand at thumb web space 17  At base of 2nd digit 5.6  (Blank rows = not tested)   QUICK DASH SURVEY:                                                                                                                                TREATMENT DATE:   09/23/23: Continued instructing in Strength ABC program starting at the dead lift educating pt not to arch back and move from hips while keeping knees soft. Instructed pt in the rest  of the Strength ABC exercises. Educated pt in pec stretches using foam roller: alternating flexion x 10, scaption bilaterally x 10, horizontal abduction with prolonged holds x 60 sec, snow angels but pt had increased tightness so attempted this on half foam roll x 5 reps each - educated pt to used rolled up towels at home in place of roller  09/03/23:  Instructed pt in lymphedema risk reduction practices and issued handout. Educated pt that her risk is very low since she only had one breast lymph node removed Instructed pt in Strength ABC program today through squats - pt held all stretches for 20 sec and then completed all exercises at 10 reps and used 1 lbs for resistance. Educated pt about importance of core muscles to protect back and not to do any exercises that cause pain. Pt had pain with super woman so educated her not to this one at home. Gave v/c for proper form for all exercises.  09/01/23- none today due to time constraints - see education section    PATIENT EDUCATION:  Education details: anatomy and physiology of the lymphatic system, need for stretching to decrease posture, basic posture exercises Person educated: Patient Education method: Explanation and Demonstration Education comprehension: verbalized understanding  HOME EXERCISE PROGRAM: Work on end range stretching  [employer]  After Breast Cancer Class Video It is recommended you view the ABC class video to be educated on lymphedema risk reduction. This video lasts for about 30 minutes. It can be viewed on our website here: [email or web address]  ASSESSMENT:  CLINICAL IMPRESSION: Continued to educate pt in Strength ABC program and instructed pt in entirety of program.  Instructed pt in pec stretches using foam roll as she has increased pec tightness bilaterally. Pt was feeling discomfort from wearing the prosthetics so therapist checked per pt request and pt did not have any swelling. Encouraged pt to be compliant with exercises so next visit she can bring any questions or concerns.    OBJECTIVE IMPAIRMENTS: decreased knowledge of condition, increased fascial restrictions, and postural dysfunction.   ACTIVITY LIMITATIONS: lifting and reach over head  PARTICIPATION LIMITATIONS: occupation  PERSONAL FACTORS: Fitness and 1 comorbidity: has bulging disc at L3  are also affecting patient's functional outcome.   REHAB POTENTIAL: Good  CLINICAL DECISION MAKING: Evolving/moderate complexity  EVALUATION COMPLEXITY: Moderate  GOALS: Goals reviewed with patient? Yes  SHORT TERM GOALS=LONG TERM GOALS Target date: 09/29/23  Pt will be independent in Strength ABC program for long term stretching and strengthening.  Baseline: Goal status: INITIAL  2.  Pt will be able to verbalize lymphedema risk reduction practices to decrease risk of lymphedema.  Baseline:  Goal status: INITIAL   PLAN:  PT FREQUENCY: 1-2x/week  PT DURATION: 4 weeks  PLANNED INTERVENTIONS: 97164- PT Re-evaluation, 97110-Therapeutic exercises, 97530- Therapeutic activity, 97112- Neuromuscular re-education, 97535- Self Care, 16109- Manual therapy, [phone]- Orthotic Fit/training, Patient/Family education, Therapeutic exercises, Therapeutic activity, Neuromuscular re-education, and Self Care  PLAN FOR NEXT SESSION:  how did she do with HEP? [facility], PT 09/23/2023, 11:55 AM

## 2023-09-24 ENCOUNTER — Other Ambulatory Visit: Payer: Self-pay

## 2023-09-28 ENCOUNTER — Other Ambulatory Visit: Payer: Self-pay

## 2023-09-28 ENCOUNTER — Other Ambulatory Visit (HOSPITAL_COMMUNITY): Payer: Self-pay

## 2023-09-28 NOTE — Progress Notes (Signed)
 Specialty Pharmacy Refill Coordination Note  [name] is a 58 y.o. female contacted today regarding refills of specialty medication(s) Dasatinib Zandra Abts)   Patient requested [name] at [facility] at [place] date: 10/01/23   Medication will be filled on 09/30/23.

## 2023-10-06 ENCOUNTER — Encounter: Payer: Self-pay | Admitting: Physical Therapy

## 2023-10-06 ENCOUNTER — Ambulatory Visit: Payer: Self-pay | Admitting: Physical Therapy

## 2023-10-06 DIAGNOSIS — Z803 Family history of malignant neoplasm of breast: Secondary | ICD-10-CM

## 2023-10-06 DIAGNOSIS — Z483 Aftercare following surgery for neoplasm: Secondary | ICD-10-CM

## 2023-10-06 DIAGNOSIS — R293 Abnormal posture: Secondary | ICD-10-CM

## 2023-10-06 NOTE — Therapy (Signed)
 OUTPATIENT PHYSICAL THERAPY  UPPER EXTREMITY ONCOLOGY TREATMENT  Patient Name: [name] MRN: [id number] DOB:23-Jan-1966, 58 y.o., female Today's Date: 10/06/2023  END OF SESSION:  PT End of Session - 10/06/23 1512     Visit Number 4    Number of Visits 5    Date for PT Re-Evaluation 09/29/23    Authorization Type [employer]    Authorization Time Period [employer] Approved 11 visits- 09/01/2023-11/29/2023-author# [id number]    PT Start Time 1511    PT Stop Time 1553    PT Time Calculation (min) 42 min    Activity Tolerance Patient tolerated treatment well    Behavior During Therapy WFL for tasks assessed/performed             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   Osteoporosis    Past Surgical History:  Procedure Laterality Date   ABDOMINAL HYSTERECTOMY  2009   APPENDECTOMY Bilateral 2005   bowel removal  2005   5 inches   BOWEL RESECTION  2005   2 feet   BREAST BIOPSY Right 04/29/2019   APOCRINE METAPLASIA    BREAST BIOPSY  02/03/2023   MM RT RADIOACTIVE SEED LOC MAMMO GUIDE 02/03/2023 [facility] MAMMOGRAP[facility]   BREAST LUMPECTOMY WITH RADIOACTIVE SEED LOCALIZATION Left 10/03/2021   Procedure: LEFT BREAST LUMPECTOMY WITH RADIOACTIVE SEED LOCALIZATION;  Surgeon: Griselda Miner, MD;  Location: [facility];  Service: General;  Laterality: Left;   BREAST LUMPECTOMY WITH RADIOACTIVE SEED LOCALIZATION Right 02/04/2023   Procedure: RIGHT BREAST LUMPECTOMY WITH RADIOACTIVE SEED LOCALIZATION;  Surgeon: Almond Lint, MD;  Location: [facility];  Service: General;  Laterality: Right;    COLONOSCOPY     FRACTURE SURGERY     Collar Bone Left Plate placed   TONSILLECTOMY     As a child   Patient Active Problem List   Diagnosis Date Noted   Osteoporosis without current pathological fracture 05/22/2023   Chronic back pain 07/29/2022   Depression 11/18/2021   Osteoporosis 11/18/2021   Abnormal magnetic resonance imaging of left breast 08/06/2021   Diarrhea 05/24/2021   Genetic testing 05/21/2021   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   Preventive measure 04/23/2021   Family history of pancreatic cancer 03/20/2021   CML (chronic myeloid leukemia) (HCC) 03/01/2021   Family history of breast cancer 03/01/2021   Continuous dependence on cigarette smoking 03/01/2021    PCP: Georgianne Fick, MD  REFERRING PROVIDER: Artis Delay, MD  REFERRING DIAG: Z80.3 (ICD-10-CM) - Family history of breast cancer  THERAPY DIAG:  Aftercare following surgery for neoplasm  Abnormal posture  Family history of malignant neoplasm of breast  ONSET DATE: 08/12/23  Rationale for Evaluation and Treatment: Rehabilitation  SUBJECTIVE:  SUBJECTIVE STATEMENT: I have been doing pretty good until a couple of days ago. I am having to pack up stuff from my house so I just got out of my routine.   PERTINENT HISTORY: Prophylactic total mastectomy on 08/12/23 with 0/1 lymph nodes on R, chronic myeloid leukemia, 2005- bowel resection 2005,  L breast lumpectomy 10/03/21, R breast lumpectomy 02/04/23 - both for complex sclerosing lesions, bulging disc at L3 with radiating pain  PAIN:  Are you having pain? No  PRECAUTIONS: Other: bulging disc at L3, lymphedema risk on R  RED FLAGS: Bulging disc at L3    WEIGHT BEARING RESTRICTIONS: No  FALLS:  Has patient fallen in last 6 months? Yes.  Number of falls 1 Sept 29th 2024, stepping up and leg gave out  LIVING ENVIRONMENT: Lives with: lives alone Lives in: House/apartment Stairs: Yes; External: 1 steps; none Has following equipment at home: None  OCCUPATION: part time - retail  LEISURE: not currently exercising due to bulging disc  HAND DOMINANCE: right   PRIOR LEVEL OF FUNCTION: Independent  PATIENT GOALS: ROM and strengthening exercises   OBJECTIVE: Note: Objective measures were completed at Evaluation unless otherwise noted.  COGNITION: Overall cognitive status: Within functional limits for tasks assessed   PALPATION: Good skin mobility around scars  OBSERVATIONS / OTHER ASSESSMENTS: bilateral scars healing well   POSTURE: forward head and rounded shoulders  UPPER EXTREMITY AROM/PROM:  A/PROM RIGHT   eval   Shoulder extension 72  Shoulder flexion 170  Shoulder abduction 174  Shoulder internal rotation 59  Shoulder external rotation 95    (Blank rows = not tested)  A/PROM LEFT   eval  Shoulder extension 69  Shoulder flexion 168  Shoulder abduction 168  Shoulder internal rotation 62  Shoulder external rotation 95    (Blank rows = not tested)   UPPER EXTREMITY STRENGTH: 5/5  LYMPHEDEMA ASSESSMENTS:   LANDMARK RIGHT  eval  At axilla  30  10 cm proximal to olecranon process 25.8  Olecranon process 22.5  10 cm proximal to ulnar styloid process 20  Just proximal to ulnar styloid process 15.5  Across hand at thumb web space 18.5  At base of 2nd digit 6  (Blank rows = not tested)  LANDMARK LEFT  eval  At axilla  27.5  10 cm proximal to olecranon process 26.2  Olecranon process 22.3  10 cm proximal to ulnar styloid process 19.6  Just proximal to ulnar styloid process 15  Across hand at thumb web space 17  At base of 2nd digit 5.6  (Blank rows = not tested)   QUICK DASH SURVEY:                                                                                                                                 TREATMENT DATE:  10/06/23: Educated pt in pec stretches using foam roller: alternating flexion x 10, scaption bilaterally x 10, horizontal abduction  with prolonged holds x 60 sec, snow angels x 10 reps each Issued info on how to obtain a soft foam roller Discussed single limb stance practice at home at counter for balance   09/23/23: Continued instructing in Strength ABC program starting at the dead lift educating pt not to arch back and move from hips while keeping knees soft. Instructed pt in the rest of the Strength ABC exercises. Educated pt in pec stretches using foam roller: alternating flexion x 10, scaption bilaterally x 10, horizontal abduction with prolonged holds x 60 sec, snow angels but pt had increased tightness so attempted this on half foam roll x 5 reps each - educated pt to used rolled up towels at home in place of roller  09/03/23:  Instructed pt in lymphedema risk reduction practices and issued handout. Educated pt that her risk is very low since she only had one breast lymph node removed Instructed pt in Strength ABC program today through squats - pt held all stretches for 20 sec and then completed all exercises at 10 reps and used 1 lbs for resistance. Educated pt about importance of core muscles to protect back and not to do any exercises that cause pain. Pt had pain with super woman so educated her not to this one at home. Gave v/c for proper form for all exercises.  09/01/23- none today due to time constraints - see education section    PATIENT EDUCATION:  Education details: anatomy and physiology of the lymphatic system, need for stretching to decrease posture, basic posture exercises Person educated: Patient Education method: Explanation and Demonstration Education comprehension: verbalized understanding  HOME EXERCISE PROGRAM: Work on end range stretching  [employer]  After Breast Cancer Class Video It is recommended you view the ABC  class video to be educated on lymphedema risk reduction. This video lasts for about 30 minutes. It can be viewed on our website here: [email or web address]  ASSESSMENT:  CLINICAL IMPRESSION: Assessed pt's progress towards goals in therapy and she has now met all goals. Pt has been compliant with her HEP since last session. Issued info for pt to obtain a foam roller for pec stretching at home. Pt will be discharged from skilled PT services at this time.    OBJECTIVE IMPAIRMENTS: decreased knowledge of condition, increased fascial restrictions, and postural dysfunction.   ACTIVITY LIMITATIONS: lifting and reach over head  PARTICIPATION LIMITATIONS: occupation  PERSONAL FACTORS: Fitness and 1 comorbidity: has bulging disc at L3  are also affecting patient's functional outcome.   REHAB POTENTIAL: Good  CLINICAL DECISION MAKING: Evolving/moderate complexity  EVALUATION COMPLEXITY: Moderate  GOALS: Goals reviewed with patient? Yes  SHORT TERM GOALS=LONG TERM GOALS Target date: 09/29/23  Pt will be independent in Strength ABC program for long term stretching and strengthening.  Baseline: Goal status: MET 10/06/23  2.  Pt will be able to verbalize lymphedema risk reduction practices to decrease risk of lymphedema.  Baseline:  Goal status: MET 10/06/23   PLAN:  PT FREQUENCY: 1-2x/week  PT DURATION: 4 weeks  PLANNED INTERVENTIONS: 97164- PT Re-evaluation, 97110-Therapeutic exercises, 97530- Therapeutic activity, 97112- Neuromuscular re-education, 97535- Self Care, 16109- Manual therapy, [id number]- Orthotic Fit/training, Patient/Family education, Therapeutic exercises, Therapeutic activity, Neuromuscular re-education, and Self Care  PLAN FOR NEXT SESSION:  how did she do with HEP?  Milagros Loll [place], PT 10/06/2023, 3:54 PM  PHYSICAL THERAPY DISCHARGE SUMMARY  Visits from Start of Care: 4  Current functional level  related to goals / functional outcomes: All goals met  Remaining deficits: None   Education / Equipment: HEP   Patient agrees to discharge. Patient goals were met. Patient is being discharged due to meeting the stated rehab goals.  Milagros Loll [place], [place] 10/06/23 3:54 PM

## 2023-10-14 ENCOUNTER — Other Ambulatory Visit: Payer: Self-pay

## 2023-10-19 ENCOUNTER — Other Ambulatory Visit: Payer: Self-pay

## 2023-10-20 ENCOUNTER — Other Ambulatory Visit: Payer: Self-pay

## 2023-10-20 ENCOUNTER — Other Ambulatory Visit: Payer: Self-pay | Admitting: Pharmacy Technician

## 2023-10-20 NOTE — Progress Notes (Signed)
 Specialty Pharmacy Refill Coordination Note  [name] is a 58 y.o. female contacted today regarding refills of specialty medication(s)  Dasatinib [facility])     Patient requested (Patient-Rptd) Pickup at [facility] at [facility] date: (Patient-Rptd) 10/23/23   Medication will be filled on 10/22/23.

## 2023-10-21 ENCOUNTER — Other Ambulatory Visit: Payer: Self-pay

## 2023-10-22 ENCOUNTER — Other Ambulatory Visit: Payer: Self-pay

## 2023-10-22 NOTE — Progress Notes (Signed)
 Insurance will not pay until 4/5. Shipping 4/7 for 4/8. Called patient and mailbox was full.

## 2023-10-24 ENCOUNTER — Other Ambulatory Visit (HOSPITAL_COMMUNITY): Payer: Self-pay

## 2023-10-26 ENCOUNTER — Other Ambulatory Visit: Payer: Self-pay

## 2023-11-01 ENCOUNTER — Encounter: Payer: Self-pay | Admitting: Hematology and Oncology

## 2023-11-02 ENCOUNTER — Inpatient Hospital Stay: Attending: Hematology and Oncology

## 2023-11-02 ENCOUNTER — Inpatient Hospital Stay: Payer: BC Managed Care – PPO

## 2023-11-02 ENCOUNTER — Encounter: Payer: Self-pay | Admitting: Pulmonary Disease

## 2023-11-02 DIAGNOSIS — F17210 Nicotine dependence, cigarettes, uncomplicated: Secondary | ICD-10-CM | POA: Diagnosis not present

## 2023-11-02 DIAGNOSIS — Z9013 Acquired absence of bilateral breasts and nipples: Secondary | ICD-10-CM | POA: Diagnosis not present

## 2023-11-02 DIAGNOSIS — C9210 Chronic myeloid leukemia, BCR/ABL-positive, not having achieved remission: Secondary | ICD-10-CM | POA: Diagnosis present

## 2023-11-02 DIAGNOSIS — M545 Low back pain, unspecified: Secondary | ICD-10-CM

## 2023-11-02 LAB — COMPREHENSIVE METABOLIC PANEL WITH GFR
ALT: 25 U/L (ref 0–44)
AST: 26 U/L (ref 15–41)
Albumin: 4.3 g/dL (ref 3.5–5.0)
Alkaline Phosphatase: 94 U/L (ref 38–126)
Anion gap: 5 (ref 5–15)
BUN: 20 mg/dL (ref 6–20)
CO2: 24 mmol/L (ref 22–32)
Calcium: 8.9 mg/dL (ref 8.9–10.3)
Chloride: 109 mmol/L (ref 98–111)
Creatinine, Ser: 0.86 mg/dL (ref 0.44–1.00)
GFR, Estimated: >60 mL/min (ref >60)
Glucose, Bld: 135 mg/dL — ABNORMAL HIGH (ref 70–99)
Potassium: 3.7 mmol/L (ref 3.5–5.1)
Sodium: 138 mmol/L (ref 135–145)
Total Bilirubin: 0.3 mg/dL (ref 0.0–1.2)
Total Protein: 6.4 g/dL — ABNORMAL LOW (ref 6.5–8.1)

## 2023-11-02 LAB — CBC WITH DIFFERENTIAL/PLATELET
Abs Immature Granulocytes: 0.01 10*3/uL (ref 0.00–0.07)
Basophils Absolute: 0.1 10*3/uL (ref 0.0–0.1)
Basophils Relative: 1 %
Eosinophils Absolute: 0.4 10*3/uL (ref 0.0–0.5)
Eosinophils Relative: 6 %
HCT: 35.1 % — ABNORMAL LOW (ref 36.0–46.0)
Hemoglobin: 12.4 g/dL (ref 12.0–15.0)
Immature Granulocytes: 0 %
Lymphocytes Relative: 39 %
Lymphs Abs: 3 10*3/uL (ref 0.7–4.0)
MCH: 33.9 pg (ref 26.0–34.0)
MCHC: 35.3 g/dL (ref 30.0–36.0)
MCV: 95.9 fL (ref 80.0–100.0)
Monocytes Absolute: 0.4 10*3/uL (ref 0.1–1.0)
Monocytes Relative: 6 %
Neutro Abs: 3.8 10*3/uL (ref 1.7–7.7)
Neutrophils Relative %: 48 %
Platelets: 307 10*3/uL (ref 150–400)
RBC: 3.66 MIL/uL — ABNORMAL LOW (ref 3.87–5.11)
RDW: 12 % (ref 11.5–15.5)
WBC: 7.7 10*3/uL (ref 4.0–10.5)
nRBC: 0 % (ref 0.0–0.2)

## 2023-11-05 LAB — BCR-ABL1, CML/ALL, PCR, QUANT
E1A2 Transcript: <0.0032 %
b2a2 transcript: 0.0418 %
b3a2 transcript: <0.0032 %

## 2023-11-17 ENCOUNTER — Inpatient Hospital Stay (HOSPITAL_BASED_OUTPATIENT_CLINIC_OR_DEPARTMENT_OTHER): Payer: BC Managed Care – PPO | Admitting: Hematology and Oncology

## 2023-11-17 ENCOUNTER — Encounter: Payer: Self-pay | Admitting: Hematology and Oncology

## 2023-11-17 VITALS — BP 120/70 | HR 79 | Temp 98.7°F | Resp 17 | Wt 154.4 lb

## 2023-11-17 DIAGNOSIS — Z1509 Genetic susceptibility to other malignant neoplasm: Secondary | ICD-10-CM

## 2023-11-17 DIAGNOSIS — Z1501 Genetic susceptibility to malignant neoplasm of breast: Secondary | ICD-10-CM

## 2023-11-17 DIAGNOSIS — C9210 Chronic myeloid leukemia, BCR/ABL-positive, not having achieved remission: Secondary | ICD-10-CM

## 2023-11-17 DIAGNOSIS — F17210 Nicotine dependence, cigarettes, uncomplicated: Secondary | ICD-10-CM | POA: Diagnosis not present

## 2023-11-17 DIAGNOSIS — Z1502 Genetic susceptibility to malignant neoplasm of ovary: Secondary | ICD-10-CM

## 2023-11-17 DIAGNOSIS — Z1589 Genetic susceptibility to other disease: Secondary | ICD-10-CM | POA: Diagnosis not present

## 2023-11-17 NOTE — Assessment & Plan Note (Addendum)
 She had successful surgery in January with bilateral mastectomy without reconstruction surgery The patient is pleased with her postop recovery She has no concerns

## 2023-11-17 NOTE — Assessment & Plan Note (Addendum)
 We reviewed test results Recent molecular study confirm major molecular response She will continue current dose of Sprycel  indefinitely I will see her again in 3 months

## 2023-11-17 NOTE — Progress Notes (Signed)
 [facility] OFFICE PROGRESS NOTE  Patient Care Team: Virgle Grime, MD as PCP - General (Internal Medicine)  Assessment & Plan CML (chronic myeloid leukemia) (HCC) We reviewed test results Recent molecular study confirm major molecular response She will continue current dose of Sprycel  indefinitely I will see her again in 3 months Continuous dependence on cigarette smoking She is motivated and is attempting to quit smoking in the near future Monoallelic mutation of CHEK2 gene in female patient She had successful surgery in January with bilateral mastectomy without reconstruction surgery The patient is pleased with her postop recovery She has no concerns   No orders of the defined types were placed in this encounter.    Almeda Jacobs, MD  INTERVAL HISTORY: she returns for treatment follow-up Complications related to previous cycle of chemotherapy included none She is compliant taking her medications as directed No infection She has uneventful postop recovery after bilateral mastectomy She is attempting to quit smoking, down to about 7 cigarettes/day  PHYSICAL EXAMINATION: ECOG PERFORMANCE STATUS: 0 - Asymptomatic  Vitals:   11/17/23 1116  BP: 120/70  Pulse: 79  Resp: 17  Temp: 98.7 F (37.1 C)  SpO2: 100%   Filed Weights   11/17/23 1116  Weight: 154 lb 6.4 oz (70 kg)    Relevant data reviewed during this visit included CBC, CMP and recent test for [facility]

## 2023-11-17 NOTE — Assessment & Plan Note (Addendum)
 She is motivated and is attempting to quit smoking in the near future

## 2023-11-25 ENCOUNTER — Other Ambulatory Visit: Payer: Self-pay

## 2023-11-30 ENCOUNTER — Other Ambulatory Visit: Payer: Self-pay

## 2023-12-02 ENCOUNTER — Other Ambulatory Visit (HOSPITAL_COMMUNITY): Payer: Self-pay

## 2023-12-02 ENCOUNTER — Other Ambulatory Visit: Payer: Self-pay

## 2023-12-02 NOTE — Progress Notes (Signed)
 Specialty Pharmacy Refill Coordination Note  [name] is a 58 y.o. female contacted today regarding refills of specialty medication(s) Dasatinib  (SPRYCEL )   Patient requested [place] D[facility] at [facility] at [place] date: 12/07/23   Medication will be filled on 05.16.25.

## 2023-12-03 ENCOUNTER — Ambulatory Visit: Payer: BC Managed Care – PPO | Admitting: *Deleted

## 2023-12-03 VITALS — BP 106/65 | HR 79 | Temp 98.7°F | Resp 18 | Ht 64.0 in | Wt 151.8 lb

## 2023-12-03 DIAGNOSIS — M810 Age-related osteoporosis without current pathological fracture: Secondary | ICD-10-CM

## 2023-12-03 MED ORDER — DENOSUMAB 60 MG/ML ~~LOC~~ SOSY
60.0000 mg | PREFILLED_SYRINGE | Freq: Once | SUBCUTANEOUS | Status: AC
  Administered 2023-12-03: 60 mg via SUBCUTANEOUS
  Filled 2023-12-03: qty 1

## 2023-12-03 NOTE — Progress Notes (Signed)
 Diagnosis: Osteoporosis  Provider:  Mannam, Praveen MD  Procedure: Injection  Prolia  (Denosumab ), Dose: 60 mg, Site: subcutaneous, Number of injections: 1  Injection Site(s): Left upper quad. abdomen  Post Care: Observation period completed  Discharge: Condition: Good, Destination: Home . AVS Declined  Performed by:  Devonne Folk, RN

## 2023-12-17 ENCOUNTER — Other Ambulatory Visit (HOSPITAL_COMMUNITY): Payer: Self-pay

## 2023-12-17 ENCOUNTER — Other Ambulatory Visit: Payer: Self-pay | Admitting: Internal Medicine

## 2023-12-17 MED ORDER — COLESTIPOL HCL 1 G PO TABS
4.0000 g | ORAL_TABLET | Freq: Every day | ORAL | 2 refills | Status: DC
Start: 2023-12-17 — End: 2024-01-07
  Filled 2023-12-17: qty 120, 30d supply, fill #0

## 2023-12-31 ENCOUNTER — Other Ambulatory Visit: Payer: Self-pay

## 2024-01-04 ENCOUNTER — Other Ambulatory Visit: Payer: Self-pay

## 2024-01-04 ENCOUNTER — Other Ambulatory Visit: Payer: Self-pay | Admitting: Hematology and Oncology

## 2024-01-04 MED ORDER — DASATINIB 140 MG PO TABS
140.0000 mg | ORAL_TABLET | Freq: Every day | ORAL | 3 refills | Status: DC
  Filled 2024-01-04 – 2024-01-06 (×2): qty 30, 30d supply, fill #0
  Filled 2024-02-02: qty 30, 30d supply, fill #1
  Filled 2024-02-15 – 2024-03-12 (×5): qty 30, 30d supply, fill #2
  Filled 2024-04-08: qty 30, 30d supply, fill #3

## 2024-01-06 ENCOUNTER — Other Ambulatory Visit: Payer: Self-pay

## 2024-01-06 NOTE — Progress Notes (Signed)
 Specialty Pharmacy Refill Coordination Note  [name] is a 58 y.o. female contacted today regarding refills of specialty medication(s) Dasatinib  (SPRYCEL )   Patient requested Abdurahman Rugg Dk at [facility] at [place] date: 01/08/24   Medication will be filled on 01/07/19.

## 2024-01-07 ENCOUNTER — Ambulatory Visit: Admitting: Internal Medicine

## 2024-01-07 ENCOUNTER — Other Ambulatory Visit (HOSPITAL_COMMUNITY): Payer: Self-pay

## 2024-01-07 ENCOUNTER — Encounter: Payer: Self-pay | Admitting: Internal Medicine

## 2024-01-07 ENCOUNTER — Other Ambulatory Visit: Payer: Self-pay

## 2024-01-07 VITALS — BP 110/78 | HR 73 | Ht 64.0 in | Wt 149.5 lb

## 2024-01-07 DIAGNOSIS — L290 Pruritus ani: Secondary | ICD-10-CM

## 2024-01-07 DIAGNOSIS — R197 Diarrhea, unspecified: Secondary | ICD-10-CM

## 2024-01-07 DIAGNOSIS — Z1589 Genetic susceptibility to other disease: Secondary | ICD-10-CM

## 2024-01-07 DIAGNOSIS — K640 First degree hemorrhoids: Secondary | ICD-10-CM | POA: Diagnosis not present

## 2024-01-07 DIAGNOSIS — K649 Unspecified hemorrhoids: Secondary | ICD-10-CM

## 2024-01-07 DIAGNOSIS — Z860100 Personal history of colon polyps, unspecified: Secondary | ICD-10-CM

## 2024-01-07 DIAGNOSIS — K219 Gastro-esophageal reflux disease without esophagitis: Secondary | ICD-10-CM

## 2024-01-07 MED ORDER — COLESTIPOL HCL 1 G PO TABS
4.0000 g | ORAL_TABLET | Freq: Every day | ORAL | 2 refills | Status: DC
  Filled 2024-01-07 – 2024-01-18 (×3): qty 120, 30d supply, fill #0
  Filled 2024-02-19: qty 120, 30d supply, fill #1
  Filled 2024-03-22: qty 120, 30d supply, fill #2

## 2024-01-07 NOTE — Progress Notes (Signed)
 Chief Complaint: Diarrhea  HPI : 58 year old female with history of CHEK2 mutation, prior SBO s/p ileocecectomy, CML, and s/p lumpectomy presents for follow up of diarrhea  Back in 2005 she had terrible abdominal pain and was diagnosed with endometriosis that had infiltrated into his small and large intestine. This led 3 subsequent intestinal surgeries including an ileocecectomy.  After her surgery, she has had issues with diarrhea and has to follow a low residue diet for the most part.  Sometimes she eats too much fiber, which causes more issues with diarrhea. Maternal grandmother had colon cancer. Patient works at [employer].  Interval History: She started having diarrhea a couple months ago. Unclear what set off the diarrhea. She was getting raw from having frequent BMs. Fiber consumption can cause more diarrhea. She has been using Imodium to help with the diarrhea. She has been taking 2 Imodium in the morning and her colestipol  4 g in the morning, which seems to be effective for controlling her diarrhea. Currently on this regimen she has on average 1-2 BMs per day. She feels a little scab in the anus. She applied A&D ointment, which has helped with the scab. She has a history of anal fissure in the past so she wondered if that is what she has. Endorses excessive wiping at times. If she still has more itching afterwards, then she will use Proctosol. She will see some pink when she wipes on occasion. Denies overt red bleeding.    Wt Readings from Last 3 Encounters:  12/03/23 151 lb 12.8 oz (68.9 kg)  11/17/23 154 lb 6.4 oz (70 kg)  06/03/23 147 lb (66.7 kg)   Past Medical History:  Diagnosis Date   Allergy    Anxiety    Arthritis    Cancer (HCC) 2024   Right Breast Cancer   Cancer (HCC) 2023   Left Breast Cancer   Cancer (HCC)    Chronic CML   Clotting disorder (HCC)    Depression    Family history of pancreatic cancer 03/20/2021   GERD (gastroesophageal reflux disease)    Headache     Hx   Hyperlipidemia    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   Osteoporosis      Past Surgical History:  Procedure Laterality Date   ABDOMINAL HYSTERECTOMY  2009   APPENDECTOMY Bilateral 2005   bowel removal  2005   5 inches   BOWEL RESECTION  2005   2 feet   BREAST BIOPSY Right 04/29/2019   APOCRINE METAPLASIA    BREAST BIOPSY  02/03/2023   MM RT RADIOACTIVE SEED LOC MAMMO GUIDE 02/03/2023 [facility] MAMMOGRAPHY   BREAST LUMPECTOMY WITH RADIOACTIVE SEED LOCALIZATION Left 10/03/2021   Procedure: LEFT BREAST LUMPECTOMY WITH RADIOACTIVE SEED LOCALIZATION;  Surgeon: Caralyn Chandler, MD;  Location: [facility];  Service: General;  Laterality: Left;   BREAST LUMPECTOMY WITH RADIOACTIVE SEED LOCALIZATION Right 02/04/2023   Procedure: RIGHT BREAST LUMPECTOMY WITH RADIOACTIVE SEED LOCALIZATION;  Surgeon: Lockie Rima, MD;  Location: [facility];  Service: General;  Laterality: Right;   COLONOSCOPY     FRACTURE SURGERY     Collar Bone Left Plate placed   TONSILLECTOMY     As a child   Family History  Problem Relation Age of Onset   Breast cancer Mother        bilateral; dx 13, 77   Acute myelogenous leukemia Mother        dx late 12s  Pancreatic cancer Father 35   Colon cancer Maternal Grandmother 81   Uterine cancer Maternal Aunt    Breast cancer Maternal Aunt        dx after 50   Bladder Cancer Paternal Uncle        dx after 56   Esophageal cancer Neg Hx    Rectal cancer Neg Hx    Stomach cancer Neg Hx    Social History   Tobacco Use   Smoking status: Every Day    Current packs/day: 1.00    Average packs/day: 1 pack/day for 17.0 years (17.0 ttl pk-yrs)    Types: Cigarettes   Smokeless tobacco: Never  Vaping Use   Vaping status: Never Used  Substance Use Topics   Alcohol use: Not Currently   Drug use: No   Current Outpatient Medications  Medication Sig Dispense Refill   acetaminophen  (TYLENOL ) 325 MG tablet Take 325-650 mg by mouth  daily as needed (pain).     calcium carbonate (TUMS - DOSED IN MG ELEMENTAL CALCIUM) 500 MG chewable tablet Chew 1 tablet by mouth 2 (two) times daily as needed for heartburn or indigestion.     Calcium Carbonate-Vit D-Min (CALCIUM 1200 PO) Take 1 tablet by mouth in the morning and at bedtime.     cholecalciferol (VITAMIN D3) 25 MCG (1000 UNIT) tablet Take 1,000 Units by mouth in the morning.     clonazePAM (KLONOPIN) 0.5 MG tablet Take 0.5 mg by mouth 2 (two) times daily as needed for anxiety.     colestipol  (COLESTID ) 1 g tablet Take 4 tablets (4 g total) by mouth daily. 120 tablet 2   dasatinib  (SPRYCEL ) 140 MG tablet Take 1 tablet (140 mg total) by mouth daily. 30 tablet 3   diazepam  (VALIUM ) 5 MG tablet Take 1 tablet by mouth 1 hour before procedure with very light food. May bring 2nd tablet to appointment. 2 tablet 0   escitalopram  (LEXAPRO ) 20 MG tablet Take 30 mg by mouth daily.     Eyelid Cleansers (AVENOVA EX) Apply 1 spray topically 2 (two) times daily as needed (eye debris.).     famotidine (PEPCID) 20 MG tablet Take 20 mg by mouth 2 (two) times daily.     fexofenadine (ALLEGRA) 180 MG tablet Take 180 mg by mouth daily as needed for allergies or rhinitis.     ibuprofen (ADVIL) 200 MG tablet Take 400-600 mg by mouth every 8 (eight) hours as needed (pain.).     ketoconazole  (NIZORAL ) 2 % cream Apply Externally twice a day 28 days (Patient taking differently: Apply 1 Application topically See admin instructions. One application up to twice daily for skin irritation (underarms).) 30 g 3   Lactobacillus (PROBIOTIC ACIDOPHILUS PO) Take 1 capsule by mouth in the morning.     loperamide (IMODIUM) 2 MG capsule Take 2 mg by mouth in the morning and at bedtime.     LORazepam  (ATIVAN ) 1 MG tablet Take 1 tablet (1 mg total) by mouth 2 (two) times daily as needed for anxiety. 10 tablet 0   meloxicam  (MOBIC ) 7.5 MG tablet Take 1 tablet (7.5 mg total) by mouth 2 (two) times daily as needed for pain. 30  tablet 2   metaxalone  (SKELAXIN ) 400 MG tablet Take 1 tablet (400 mg total) by mouth 3 (three) times daily as needed. (Patient taking differently: Take 200-400 mg by mouth 3 (three) times daily as needed.) 30 tablet 0   Multiple Vitamin (MULTIVITAMIN WITH MINERALS) TABS tablet Take 1 tablet  by mouth in the morning.     ondansetron  (ZOFRAN ) 4 MG tablet Take 1 tablet (4 mg total) by mouth every 8 (eight) hours as needed for nausea. 10 tablet 0   No current facility-administered medications for this visit.   Allergies  Allergen Reactions   Other Other (See Comments)    Can't process certain food like lettuce, sesame seed.   Sulfa Antibiotics Swelling   Wellbutrin [Bupropion] Other (See Comments)    hyper   Zosyn [Piperacillin-Tazobactam In Dex] Rash     Review of Systems: All systems reviewed and negative except where noted in HPI.   Physical Exam: LMP 11/18/2012  Constitutional: Pleasant,well-developed, female in no acute distress. HEENT: Normocephalic and atraumatic. Conjunctivae are normal. No scleral icterus. Cardiovascular: Normal rate, regular rhythm.  Pulmonary/chest: Effort normal and breath sounds normal. No wheezing, rales or rhonchi. Abdominal: Soft, nondistended, nontender. Bowel sounds active throughout. There are no masses palpable. No hepatomegaly. Rectal: Some perianal excoriations visualized with some erythema, suspected to be due to excessive wiping. Anoscopy exam revealed grade 1 internal hemorrhoids. No anal fissure was seen. Extremities: No edema Neurological: Alert and oriented to person place and time. Skin: Skin is warm and dry. No rashes noted. Psychiatric: Normal mood and affect. Behavior is normal.  Labs 10/2022: CMP unremarkable  Labs 01/2023: CBC nml. BMP with mildly elevated Cr of 1.02.   Ab U/S 12/19/15: IMPRESSION: 1. No acute hepatobiliary abnormality is observed. No gallstones are demonstrated. If there are clinical concerns of  gallbladder dysfunction, a nuclear medicine hepatobiliary scan may be useful. 2. 7 mm nonobstructing mid to lower pole right sided kidney stone. 3. No acute intra-abdominal abnormality is observed.  Barium enema 06/24/18: IMPRESSION: 1. No persistent polypoid lesion or constricting lesion is seen. There is some retained feces present particularly in the right colon making exclusion of small polyps difficult. 2. By history the terminal ileum has been resected, and the anastomosis of distal ileum with right colon is unremarkable with no stricture evident. 3. The colon is elongated and tortuous as noted above.  RUQ U/S 02/28/21: IMPRESSION: Unremarkable right upper quadrant ultrasound  Colonoscopy 02/25/23: - The examined portion of the ileum was normal. - Patent side- to- side ileo- colonic anastomosis, characterized by healthy appearing mucosa. - Four 3 to 10 mm polyps in the rectum, in the sigmoid colon and in the transverse colon, removed with a cold snare. Resected and retrieved. - Diverticulosis in the sigmoid colon and in the descending colon. - Erythematous mucosa in the sigmoid colon. Biopsied. - Non- bleeding internal hemorrhoids. Path: 1. Surgical [P], colon, transverse, sigmoid, rectal, polyp (4) - TUBULAR ADENOMA, FRAGMENTS. 2. Surgical [P], colon nos, random sites - COLONIC MUCOSA WITH NO SIGNIFICANT PATHOLOGY.  ASSESSMENT AND PLAN: Prior SBO s/p ileocecectomy Diarrhea CHEK2 mutation Hemorrhoids Rectal itching GERD History of colon polyps Patient had an exacerbation in her diarrhea few months ago, unclear why. She has been taking Imodium as well as colestipol , which seems to be keeping this diarrhea under good control currently. Her prior colonoscopy had negative colon biopsies for microscopic colitis. Patient does have some rectal itching an felt a scab. Rectal exam today did not show any signs of an anal fissure. She did have irritated perianal skin particularly in the  posterior area that is suspected to be due to excessive wiping in that area. She does still have internal hemorrhoids for which she sometimes uses topical steroids. - Previously gave GERD handout - Stay well hydrated - Continue Pepcid  20 mg BID PRN - Continue Imodium daily - Continue colestipol  4 g every day. Refill - Avoid excessive wiping if possible. Can use Desitin in the irritated skin posterior to the anus as barrier cream - Will give some pelvic floor exercises to see if this helps with her sensation of incomplete evacuation. Patient is not interested in formal pelvic floor PT - Next colonoscopy is due in 02/2026 for polyp surveillance - Follow up in 6 months  Regino Caprio, MD  I spent 37 minutes of time, including in depth chart review, independent review of results as outlined above, communicating results with the patient directly, face-to-face time with the patient, coordinating care, and ordering studies and medications as appropriate, and documentation.

## 2024-01-07 NOTE — Patient Instructions (Addendum)
 We have sent the following medications to your pharmacy for you to pick up at your convenience: Colestipol   Follow up in 6 months _______________________________________________________  If your blood pressure at your visit was 140/90 or greater, please contact your primary care physician to follow up on this.  _______________________________________________________  If you are age 56 or older, your body mass index should be between 23-30. Your Body mass index is 25.66 kg/m. If this is out of the aforementioned range listed, please consider follow up with your Primary Care Provider.  If you are age 28 or younger, your body mass index should be between 19-25. Your Body mass index is 25.66 kg/m. If this is out of the aformentioned range listed, please consider follow up with your Primary Care Provider.   ________________________________________________________  The [place] [place] [facility] providers would like to encourage you to use MYCHART to communicate with providers for non-urgent requests or questions.  Due to long hold times on the telephone, sending your provider a message by [facility] may be a faster and more efficient way to get a response.  Please allow 48 business hours for a response.  Please remember that this is for non-urgent requests.  _______________________________________________________  Thank you for entrusting me with your care and for choosing [facility],  Dr. Regino Caprio

## 2024-01-18 ENCOUNTER — Other Ambulatory Visit (HOSPITAL_COMMUNITY): Payer: Self-pay

## 2024-01-29 ENCOUNTER — Other Ambulatory Visit (HOSPITAL_COMMUNITY): Payer: Self-pay

## 2024-02-02 ENCOUNTER — Other Ambulatory Visit: Payer: Self-pay

## 2024-02-02 ENCOUNTER — Encounter (INDEPENDENT_AMBULATORY_CARE_PROVIDER_SITE_OTHER): Payer: Self-pay

## 2024-02-02 ENCOUNTER — Other Ambulatory Visit (HOSPITAL_COMMUNITY): Payer: Self-pay

## 2024-02-02 NOTE — Progress Notes (Signed)
 Specialty Pharmacy Refill Coordination Note  MyChart Questionnaire Submission  [name] is a 58 y.o. female contacted today regarding refills of specialty medication(s) Sprycel .  Patient requested: (Patient-Rptd) Pickup at [facility] at [facility] date: (Patient-Rptd) 02/05/24  Medication will be filled on 02/04/24.

## 2024-02-03 ENCOUNTER — Other Ambulatory Visit: Payer: Self-pay

## 2024-02-03 NOTE — Progress Notes (Signed)
 Clinical Intervention Note  Clinical Intervention Notes: Patient reported starting fexofenadine. No DDIs identified with Sprycel    Clinical Intervention Outcomes: Prevention of an adverse drug event   Advertising account planner

## 2024-02-05 ENCOUNTER — Inpatient Hospital Stay: Attending: Hematology and Oncology

## 2024-02-05 DIAGNOSIS — M545 Low back pain, unspecified: Secondary | ICD-10-CM | POA: Insufficient documentation

## 2024-02-05 DIAGNOSIS — R0789 Other chest pain: Secondary | ICD-10-CM | POA: Diagnosis not present

## 2024-02-05 DIAGNOSIS — Z9013 Acquired absence of bilateral breasts and nipples: Secondary | ICD-10-CM | POA: Diagnosis not present

## 2024-02-05 DIAGNOSIS — G8929 Other chronic pain: Secondary | ICD-10-CM | POA: Insufficient documentation

## 2024-02-05 DIAGNOSIS — C9210 Chronic myeloid leukemia, BCR/ABL-positive, not having achieved remission: Secondary | ICD-10-CM | POA: Insufficient documentation

## 2024-02-05 DIAGNOSIS — Z1509 Genetic susceptibility to other malignant neoplasm: Secondary | ICD-10-CM | POA: Insufficient documentation

## 2024-02-05 DIAGNOSIS — F17210 Nicotine dependence, cigarettes, uncomplicated: Secondary | ICD-10-CM | POA: Diagnosis not present

## 2024-02-05 LAB — COMPREHENSIVE METABOLIC PANEL WITH GFR
ALT: 17 U/L (ref 0–44)
AST: 22 U/L (ref 15–41)
Albumin: 4.1 g/dL (ref 3.5–5.0)
Alkaline Phosphatase: 107 U/L (ref 38–126)
Anion gap: 4 — ABNORMAL LOW (ref 5–15)
BUN: 17 mg/dL (ref 6–20)
CO2: 26 mmol/L (ref 22–32)
Calcium: 8.7 mg/dL — ABNORMAL LOW (ref 8.9–10.3)
Chloride: 113 mmol/L — ABNORMAL HIGH (ref 98–111)
Creatinine, Ser: 0.94 mg/dL (ref 0.44–1.00)
GFR, Estimated: >60 mL/min (ref >60)
Glucose, Bld: 139 mg/dL — ABNORMAL HIGH (ref 70–99)
Potassium: 4 mmol/L (ref 3.5–5.1)
Sodium: 143 mmol/L (ref 135–145)
Total Bilirubin: 0.2 mg/dL (ref 0.0–1.2)
Total Protein: 6.2 g/dL — ABNORMAL LOW (ref 6.5–8.1)

## 2024-02-05 LAB — CBC WITH DIFFERENTIAL/PLATELET
Abs Immature Granulocytes: 0.01 K/uL (ref 0.00–0.07)
Basophils Absolute: 0.1 K/uL (ref 0.0–0.1)
Basophils Relative: 1 %
Eosinophils Absolute: 0.7 K/uL — ABNORMAL HIGH (ref 0.0–0.5)
Eosinophils Relative: 9 %
HCT: 37.6 % (ref 36.0–46.0)
Hemoglobin: 13.3 g/dL (ref 12.0–15.0)
Immature Granulocytes: 0 %
Lymphocytes Relative: 31 %
Lymphs Abs: 2.4 K/uL (ref 0.7–4.0)
MCH: 34 pg (ref 26.0–34.0)
MCHC: 35.4 g/dL (ref 30.0–36.0)
MCV: 96.2 fL (ref 80.0–100.0)
Monocytes Absolute: 0.4 K/uL (ref 0.1–1.0)
Monocytes Relative: 5 %
Neutro Abs: 4.1 K/uL (ref 1.7–7.7)
Neutrophils Relative %: 54 %
Platelets: 278 K/uL (ref 150–400)
RBC: 3.91 MIL/uL (ref 3.87–5.11)
RDW: 12.6 % (ref 11.5–15.5)
WBC: 7.7 K/uL (ref 4.0–10.5)
nRBC: 0 % (ref 0.0–0.2)

## 2024-02-10 LAB — BCR-ABL1, CML/ALL, PCR, QUANT
E1A2 Transcript: <0.0032 %
b2a2 transcript: 0.1032 %
b3a2 transcript: <0.0032 %

## 2024-02-15 ENCOUNTER — Other Ambulatory Visit: Payer: Self-pay

## 2024-02-16 ENCOUNTER — Encounter: Payer: Self-pay | Admitting: Hematology and Oncology

## 2024-02-16 ENCOUNTER — Inpatient Hospital Stay (HOSPITAL_BASED_OUTPATIENT_CLINIC_OR_DEPARTMENT_OTHER): Admitting: Hematology and Oncology

## 2024-02-16 VITALS — BP 121/75 | HR 75 | Temp 99.6°F | Resp 18 | Ht 64.0 in | Wt 152.4 lb

## 2024-02-16 DIAGNOSIS — Z1502 Genetic susceptibility to malignant neoplasm of ovary: Secondary | ICD-10-CM

## 2024-02-16 DIAGNOSIS — Z1589 Genetic susceptibility to other disease: Secondary | ICD-10-CM

## 2024-02-16 DIAGNOSIS — F17210 Nicotine dependence, cigarettes, uncomplicated: Secondary | ICD-10-CM

## 2024-02-16 DIAGNOSIS — Z1509 Genetic susceptibility to other malignant neoplasm: Secondary | ICD-10-CM

## 2024-02-16 DIAGNOSIS — C9210 Chronic myeloid leukemia, BCR/ABL-positive, not having achieved remission: Secondary | ICD-10-CM

## 2024-02-16 DIAGNOSIS — Z1501 Genetic susceptibility to malignant neoplasm of breast: Secondary | ICD-10-CM | POA: Diagnosis not present

## 2024-02-16 NOTE — Therapy (Signed)
 OUTPATIENT PHYSICAL THERAPY THORACOLUMBAR EVALUATION   Patient Name: [name] MRN: [id number] DOB:1966-02-07, 58 y.o., female Today's Date: 02/17/2024  END OF SESSION:**  PT End of Session - 02/17/24 1452     Visit Number 1    Number of Visits 17    Date for PT Re-Evaluation 04/13/24    PT Start Time 1307    PT Stop Time 1347    PT Time Calculation (min) 40 min    Activity Tolerance Patient tolerated treatment well    Behavior During Therapy [facility] for tasks assessed/performed          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   Osteoporosis    Past Surgical History:  Procedure Laterality Date   ABDOMINAL HYSTERECTOMY  2009   APPENDECTOMY Bilateral 2005   bowel removal  2005   5 inches   BOWEL RESECTION  2005   2 feet   BREAST BIOPSY Right 04/29/2019   APOCRINE METAPLASIA    BREAST BIOPSY  02/03/2023   MM RT RADIOACTIVE SEED LOC MAMMO GUIDE 02/03/2023 [facility] MAMMOGRAPHY   BREAST LUMPECTOMY WITH RADIOACTIVE SEED LOCALIZATION Left 10/03/2021   Procedure: LEFT BREAST LUMPECTOMY WITH RADIOACTIVE SEED LOCALIZATION;  Surgeon: Curvin Deward MOULD, MD;  Location: [facility];  Service: General;  Laterality: Left;   BREAST LUMPECTOMY WITH RADIOACTIVE SEED LOCALIZATION Right 02/04/2023   Procedure: RIGHT BREAST LUMPECTOMY WITH RADIOACTIVE SEED LOCALIZATION;  Surgeon: Aron Shoulders, MD;  Location: [facility] OR;  Service: General;  Laterality: Right;   COLONOSCOPY     FRACTURE SURGERY     Collar Bone Left Plate placed   MASTECTOMY Bilateral 08/12/2023   TONSILLECTOMY     As a child    Patient Active Problem List   Diagnosis Date Noted   Osteoporosis without current pathological fracture 05/22/2023   Chronic back pain 07/29/2022   Depression 11/18/2021   Osteoporosis 11/18/2021   Abnormal magnetic resonance imaging of left breast 08/06/2021   Diarrhea 05/24/2021   Genetic testing 05/21/2021   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   Preventive measure 04/23/2021   Family history of pancreatic cancer 03/20/2021   CML (chronic myeloid leukemia) (HCC) 03/01/2021   Family history of breast cancer 03/01/2021   Continuous dependence on cigarette smoking 03/01/2021    PCP: Verdia Lombard, MD   REFERRING PROVIDER:Jones, Alm Hamilton, MD  REFERRING DIAG:  DX  M54.16 (ICD-10-CM) - Radiculopathy, lumbar region  Rationale for Evaluation and Treatment: Rehabilitation  THERAPY DIAG:  Radiculopathy, lumbar region  Other low back pain  Muscle weakness (generalized)  Abnormal posture  ONSET DATE: >1 year ago  SUBJECTIVE:  SUBJECTIVE STATEMENT:  Pt states she has had LBP with R radiating pain for> 1 year.  She was receiving PT and  the treatment got cut short due to breast cancer treatment.  Mastectomy in early 2025, no reconstruction.  Current complaints- R lumbar area pain with occasional radiating pain.  Some Stumbling on the R foot, per patient.  No falls.  PERTINENT HISTORY:  See Above  PAIN:  Are you having pain? Yes: NPRS scale: today 8/10 Pain location: R lumbar and R Knee Pain description: sharp Aggravating factors: bending , prolonged sitting Relieving factors: OTC meds  PRECAUTIONS: None  RED FLAGS: None   WEIGHT BEARING RESTRICTIONS: No  FALLS:  Has patient fallen in last 6 months? No  LIVING ENVIRONMENT: Lives with: lives  alone Lives in: House/apartment Stairs: No Has following equipment at home: None  OCCUPATION: retail involves bending  PLOF: Independent- riding horses , gym in future  PATIENT GOALS: Decrease pain and improve strength  NEXT MD VISIT: unknown  OBJECTIVE:  Note: Objective measures were completed at Evaluation unless otherwise noted.  DIAGNOSTIC FINDINGS:  Imaging MR of Spine 11/23  IMPRESSION: 1. 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 2. Moderate right subarticular zone stenosis and mild right neural foraminal narrowing at L3-4. 3. Mild bilateral neural foraminal narrowing at L1-2 and L2-3.   PATIENT SURVEYS:  PSFS: THE PATIENT SPECIFIC FUNCTIONAL SCALE  Place score of 0-10 (0 = unable to perform activity and 10 = able to perform activity at the same level as before injury or problem)  Activity Date: 02/17/24    Bending over 3    2.Lifting objects pain free 5    3.riding horses 7    4.      Total Score 5      Total Score = Sum of activity scores/number of activities  Minimally Detectable Change: 3 points (for single activity); 2 points (for average score)  [name] [facility] (nd). The Patient Specific Functional Scale . Retrieved from [email or web address]   COGNITION: Overall cognitive status: Within functional limits for tasks assessed     SENSATION: WFL  MUSCLE LENGTH: Hamstrings: Right 70 deg; Left 75 deg   POSTURE: R handed, dec lumbar lordosis and R LE ER  PALPATION: 2+ tenderness at R GM and piriformis  LUMBAR ROM:   AROM eval  Flexion 100%  Extension 75%  Right lateral flexion 50%  Left lateral flexion 100%  Right rotation 100%  Left rotation 100%   (Blank rows = not tested)  LOWER EXTREMITY MMT:    MMT Right eval Left eval  Hip flexion 4+   Hip extension    Hip abduction    Hip adduction    Hip internal rotation    Hip  external rotation    Knee flexion    Knee extension 4+   Ankle dorsiflexion 5 5  Ankle plantarflexion 5 5  Ankle inversion    Ankle eversion     (Blank rows = not tested)  LUMBAR SPECIAL TESTS:  Straight leg raise test: Positive R  FUNCTIONAL TESTS:  5 times sit to stand: 17 seconds  GAIT: Distance walked: [employer] device utilized: None Level of assistance: Complete Independence   TREATMENT DATE:  02/17/24 Initial evaluation completed, 1 set of HEP completed and reviewed, horse riding activity modifications reviewed  PATIENT EDUCATION:  Education details: See below Person educated: Patient Education method: Explanation, Demonstration, Tactile cues, Verbal cues, and Handouts Education comprehension: verbalized understanding, returned demonstration, verbal cues required, and tactile cues required  HOME EXERCISE PROGRAM: Access Code: [email or web address] URL: [email or web address] Date: 02/17/2024 Prepared by: Burnard Meth  Exercises - Supine Bridge  - 2 x daily - 7 x weekly - 2 sets - 10 reps - 2 hold - Clamshell  - 2 x daily - 7 x weekly - 2 sets - 10 reps - Supine Piriformis Stretch with Leg Straight  - 2 x daily - 7 x weekly - 1 sets - 2 reps - 25 hold - Supine Hamstring Stretch with Strap  - 2 x daily - 7 x weekly - 1 sets - 2 reps - 30 hold  ASSESSMENT:  CLINICAL IMPRESSION: Patient is a 58 y.o. female who was seen today for physical therapy evaluation and treatment for Lumbar pain R>L and some radiculopathy on the right LE.  She presents with decreased ROM, strength, restricted community activities and pain.  OBJECTIVE IMPAIRMENTS: decreased balance, decreased ROM, decreased strength, impaired flexibility, and pain.   ACTIVITY LIMITATIONS: bending, sitting, standing, squatting, and transfers  PARTICIPATION LIMITATIONS: community  activity and occupation  PERSONAL FACTORS: 1-2 comorbidities:   are also affecting patient's functional outcome.   REHAB POTENTIAL: Good  CLINICAL DECISION MAKING: Stable/uncomplicated  EVALUATION COMPLEXITY: Moderate   GOALS: Goals reviewed with patient? Yes  SHORT TERM GOALS: Target date: 03/16/2024    Pt to be independent with HEP. Baseline: Goal status: INITIAL  2.  Decrease pain by 1 level. Baseline:  Goal status: INITIAL  LONG TERM GOALS: Target date: 04/13/2024    Increase AROM by 25%. Baseline:  Goal status: INITIAL  2.  Decrease max pain to   3/10 Baseline: 8/10 Goal status: INITIAL  3.  Increase strength to 5-/5 throughout LE Baseline: 4/5 Goal status: INITIAL  4.  Able to ride horses without restriction for 25 minutes. Baseline: restrictions but able Goal status: INITIAL  5.  Decrease tenderness to 1+ at R GM and piriformis. Baseline: 2+ Goal status: INITIAL  6.  Improve PSFS score to 7 or 8. Baseline: 5 Goal status: INITIAL  PLAN:  PT FREQUENCY: 2x/week  PT DURATION: 8 weeks  PLANNED INTERVENTIONS: 97164- PT Re-evaluation, 97110-Therapeutic exercises, 97530- Therapeutic activity, 97112- Neuromuscular re-education, 97535- Self Care, [id number]- Manual therapy, [id number]- Aquatic Therapy, [id number]- Electrical stimulation (unattended), Patient/Family education, Balance training, Joint mobilization, Cryotherapy, and Moist heat.  PLAN FOR NEXT SESSION: Start on Nustep and progress with spinal stabilization program.   Burnard CHRISTELLA Meth, PT 02/17/2024, 2:54 PM

## 2024-02-16 NOTE — Assessment & Plan Note (Addendum)
 She is motivated and is attempting to quit smoking in the near future

## 2024-02-16 NOTE — Assessment & Plan Note (Addendum)
 She was diagnosed with CML in 2022, initially treated with imatinib  but have inadequate response Subsequently her treatment is switched to Sprycel  since January 2023 She achieved major molecular response in July 2024 Between July 2024 to July 2025, she has intermittent elevated BCR/ABL  We reviewed test results from July 2025 Recent molecular study showed loss of major molecular response For now, due to history of fluctuation of her levels, the plan would be to continue current dose of Sprycel  indefinitely I will see her again in 3 months

## 2024-02-16 NOTE — Assessment & Plan Note (Addendum)
 She had successful surgery in January with bilateral mastectomy without reconstruction surgery She has intermittent chest tightness I recommend the patient to continue therapy as directed by her physical therapy She has no concerns

## 2024-02-16 NOTE — Progress Notes (Signed)
 [facility] OFFICE PROGRESS NOTE  Patient Care Team: Verdia Lombard, MD as PCP - General (Internal Medicine)  Assessment & Plan CML (chronic myeloid leukemia) (HCC) She was diagnosed with CML in 2022, initially treated with imatinib  but have inadequate response Subsequently her treatment is switched to Sprycel  since January 2023 She achieved major molecular response in July 2024 Between July 2024 to July 2025, she has intermittent elevated BCR/ABL  We reviewed test results from July 2025 Recent molecular study showed loss of major molecular response For now, due to history of fluctuation of her levels, the plan would be to continue current dose of Sprycel  indefinitely I will see her again in 3 months Monoallelic mutation of CHEK2 gene in female patient She had successful surgery in January with bilateral mastectomy without reconstruction surgery She has intermittent chest tightness I recommend the patient to continue therapy as directed by her physical therapy She has no concerns  Continuous dependence on cigarette smoking She is motivated and is attempting to quit smoking in the near future  No orders of the defined types were placed in this encounter.    Almarie Bedford, MD  INTERVAL HISTORY: she returns for treatment follow-up Complications related to previous cycle of chemotherapy included intermittent mood disturbances She is compliant taking medications as directed No recent infection No signs or symptoms of fluid retention She still have chronic back pain and is undergoing evaluation and therapy We spent a lot of time reviewing test results and discussed plan of care  PHYSICAL EXAMINATION: ECOG PERFORMANCE STATUS: 0 - Asymptomatic  No results found for: CAN125    Latest Ref Rng & Units 02/05/2024   12:41 PM 11/02/2023    2:49 PM 07/27/2023   12:09 PM  CBC  WBC 4.0 - 10.5 K/uL 7.7  7.7  7.6   Hemoglobin 12.0 - 15.0 g/dL 86.6  87.5  86.6    Hematocrit 36.0 - 46.0 % 37.6  35.1  38.3   Platelets 150 - 400 K/uL 278  307  244       Chemistry      Component Value Date/Time   NA 143 02/05/2024 1241   K 4.0 02/05/2024 1241   CL 113 (H) 02/05/2024 1241   CO2 26 02/05/2024 1241   BUN 17 02/05/2024 1241   CREATININE 0.94 02/05/2024 1241   CREATININE 0.95 07/18/2022 1206      Component Value Date/Time   CALCIUM 8.7 (L) 02/05/2024 1241   ALKPHOS 107 02/05/2024 1241   AST 22 02/05/2024 1241   AST 31 07/18/2022 1206   ALT 17 02/05/2024 1241   ALT 33 07/18/2022 1206   BILITOT 0.2 02/05/2024 1241   BILITOT 0.4 07/18/2022 1206       Vitals:   02/16/24 1127  BP: 121/75  Pulse: 75  Resp: 18  Temp: 99.6 F (37.6 C)  SpO2: 94%   Filed Weights   02/16/24 1127  Weight: 152 lb 6.4 oz (69.1 kg)   Other relevant data reviewed during this visit included CBC, Cmp, BCR/ABL

## 2024-02-17 ENCOUNTER — Other Ambulatory Visit: Payer: Self-pay

## 2024-02-17 ENCOUNTER — Ambulatory Visit

## 2024-02-17 DIAGNOSIS — R293 Abnormal posture: Secondary | ICD-10-CM

## 2024-02-17 DIAGNOSIS — M6281 Muscle weakness (generalized): Secondary | ICD-10-CM

## 2024-02-17 DIAGNOSIS — M5459 Other low back pain: Secondary | ICD-10-CM

## 2024-02-17 DIAGNOSIS — M5416 Radiculopathy, lumbar region: Secondary | ICD-10-CM | POA: Diagnosis not present

## 2024-02-19 ENCOUNTER — Other Ambulatory Visit: Payer: Self-pay

## 2024-02-19 NOTE — Progress Notes (Signed)
 Specialty Pharmacy Ongoing Clinical Assessment Note  MERLYN BOLLEN is a 58 y.o. female who is being followed by the specialty pharmacy service for RxSp Oncology   Patient's specialty medication(s) reviewed today: Dasatinib  (SPRYCEL )   Missed doses in the last 4 weeks: 0   Patient/Caregiver did not have any additional questions or concerns.   Therapeutic benefit summary: Patient is NOT achieving benefit   Adverse events/side effects summary: No adverse events/side effects   Patient's therapy is appropriate to: Continue    Goals Addressed             This Visit's Progress    Slow Disease Progression   Worsening    Patient is not on track and worsening. Patient will maintain adherence and be evaluated at upcoming provider appointment to assess progress. Per visit on 02/16/24, testing in July showed that patient lost major molecular response and had fluctuation of levels. Patient to continue current treatment of Sprycel  indefinitely and will be reassessed at appointment in October.         Follow up: 6 months  [facility]

## 2024-02-19 NOTE — Therapy (Incomplete)
 OUTPATIENT PHYSICAL THERAPY THORACOLUMBAR TREATMENT   Patient Name: [name] MRN: [id number] DOB:03/14/1966, 58 y.o., female Today's Date: 02/19/2024  END OF SESSION:**    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   Osteoporosis    Past Surgical History:  Procedure Laterality Date   ABDOMINAL HYSTERECTOMY  2009   APPENDECTOMY Bilateral 2005   bowel removal  2005   5 inches   BOWEL RESECTION  2005   2 feet   BREAST BIOPSY Right 04/29/2019   APOCRINE METAPLASIA    BREAST BIOPSY  02/03/2023   MM RT RADIOACTIVE SEED LOC MAMMO GUIDE 02/03/2023 [facility]MO[facility]   BREAST LUMPECTOMY WITH RADIOACTIVE SEED LOCALIZATION Left 10/03/2021   Procedure: LEFT BREAST LUMPECTOMY WITH RADIOACTIVE SEED LOCALIZATION;  Surgeon: Natalie Deward MOULD, MD;  Location: [facility];  Service: General;  Laterality: Left;   BREAST LUMPECTOMY WITH RADIOACTIVE SEED LOCALIZATION Right 02/04/2023   Procedure: RIGHT BREAST LUMPECTOMY WITH RADIOACTIVE SEED LOCALIZATION;  Surgeon: Natalie Shoulders, MD;  Location: [facility];  Service: General;  Laterality: Right;   COLONOSCOPY     FRACTURE SURGERY     Collar Bone Left Plate placed   MASTECTOMY Bilateral 08/12/2023   TONSILLECTOMY     As a child   Patient Active Problem List   Diagnosis Date Noted   Osteoporosis without current pathological fracture 05/22/2023   Chronic back pain 07/29/2022   Depression 11/18/2021   Osteoporosis 11/18/2021   Abnormal magnetic resonance imaging of left breast 08/06/2021   Diarrhea 05/24/2021   Genetic testing 05/21/2021    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  ONSET DATE: >1 year ago  SUBJECTIVE:  SUBJECTIVE STATEMENT:   ***  Pt states she has had LBP with R radiating pain for> 1 year.  She was receiving PT and  the treatment got cut short due to breast cancer treatment.  Mastectomy in early 2025, no reconstruction.  Current complaints- R lumbar area pain with occasional radiating pain.  Some Stumbling on the R foot, per patient.  No falls.  PERTINENT HISTORY:  See Above  PAIN:  Are you having pain? Yes: NPRS scale: today 8/10 Pain location: R lumbar and R Knee Pain description: sharp Aggravating factors: bending , prolonged sitting Relieving factors: OTC meds  PRECAUTIONS: None  RED FLAGS: None   WEIGHT BEARING RESTRICTIONS: No  FALLS:  Has patient fallen in last 6 months? No  LIVING ENVIRONMENT: Lives with: lives alone Lives in: House/apartment Stairs: No Has following equipment at home: None  OCCUPATION: retail involves bending  PLOF: Independent- riding horses , gym in future  PATIENT GOALS: Decrease pain and improve strength  NEXT MD VISIT: unknown  OBJECTIVE:  Note: Objective measures were completed at Evaluation unless otherwise noted.  DIAGNOSTIC FINDINGS:  Imaging MR of Spine 11/23   IMPRESSION: 1. 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 2. Moderate right subarticular zone stenosis and mild right neural foraminal narrowing at L3-4. 3. Mild bilateral neural foraminal narrowing at L1-2 and L2-3.   PATIENT SURVEYS:  PSFS: THE PATIENT SPECIFIC FUNCTIONAL SCALE  Place score of 0-10 (0 = unable to perform activity and 10 = able to perform activity at the same level as before injury or problem)  Activity Date: 02/17/24    Bending over 3    2.Lifting objects pain free 5    3.riding horses 7    4.      Total Score 5      Total Score = Sum of activity scores/number of activities  Minimally Detectable Change: 3 points (for single activity); 2 points (for average score)  [name] (nd). The Patient Specific Functional Scale . Retrieved from [email or web address]   COGNITION: Overall cognitive status: Within functional limits for tasks assessed     SENSATION: WFL  MUSCLE LENGTH: Hamstrings: Right 70 deg; Left 75 deg   POSTURE: R handed, dec lumbar lordosis and R LE ER  PALPATION: 2+ tenderness at R GM and piriformis  LUMBAR ROM:   AROM eval  Flexion 100%  Extension 75%  Right lateral flexion 50%  Left lateral flexion 100%  Right rotation 100%  Left rotation 100%   (Blank rows = not tested)  LOWER EXTREMITY MMT:    MMT Right eval Left eval  Hip flexion 4+   Hip extension    Hip abduction    Hip adduction    Hip internal rotation    Hip external rotation    Knee flexion    Knee extension 4+   Ankle dorsiflexion 5 5  Ankle plantarflexion 5 5  Ankle inversion    Ankle eversion     (Blank rows = not tested)  LUMBAR SPECIAL TESTS:  Straight leg raise test: Positive R  FUNCTIONAL TESTS:  5 times sit to stand: 17 seconds  GAIT: Distance walked: [employer] device utilized: None Level of assistance: Complete  Independence   TREATMENT DATE:  02/22/2024 ***  02/17/24 Initial evaluation completed, 1 set of HEP completed and reviewed, horse riding activity modifications reviewed  PATIENT EDUCATION:  Education details: See below Person educated: Patient Education method: Explanation, Demonstration, Tactile cues, Verbal cues, and Handouts Education comprehension: verbalized understanding, returned demonstration, verbal cues required, and tactile cues required  HOME EXERCISE PROGRAM: Access Code: [email or web address] URL: [email or web address] Date: 02/17/2024 Prepared by: [name]  Exercises - Supine Bridge  - 2 x daily - 7 x weekly - 2 sets - 10 reps - 2 hold - Clamshell  - 2 x daily - 7 x weekly - 2 sets - 10 reps - Supine Piriformis Stretch with Leg Straight  - 2 x daily - 7 x weekly - 1 sets - 2 reps - 25 hold - Supine Hamstring Stretch with Strap  - 2 x daily - 7 x weekly - 1 sets - 2 reps - 30 hold  ASSESSMENT:  CLINICAL IMPRESSION: ***  Patient is a 58 y.o. female who was seen today for physical therapy evaluation and treatment for Lumbar pain R>L and some radiculopathy on the right LE.  She presents with decreased ROM, strength, restricted community activities and pain.  OBJECTIVE IMPAIRMENTS: decreased balance, decreased ROM, decreased strength, impaired flexibility, and pain.   ACTIVITY LIMITATIONS: bending, sitting, standing, squatting, and transfers  PARTICIPATION LIMITATIONS: community activity and occupation  PERSONAL FACTORS: 1-2 comorbidities:   are also affecting patient's functional outcome.   REHAB POTENTIAL: Good  CLINICAL DECISION MAKING: Stable/uncomplicated  EVALUATION COMPLEXITY: Moderate   GOALS: Goals reviewed with patient? Yes  SHORT TERM GOALS: Target date: 03/16/2024    Pt to be independent with HEP. Baseline: Goal  status: INITIAL  2.  Decrease pain by 1 level. Baseline:  Goal status: INITIAL  LONG TERM GOALS: Target date: 04/13/2024    Increase AROM by 25%. Baseline:  Goal status: INITIAL  2.  Decrease max pain to   3/10 Baseline: 8/10 Goal status: INITIAL  3.  Increase strength to 5-/5 throughout LE Baseline: 4/5 Goal status: INITIAL  4.  Able to ride horses without restriction for 25 minutes. Baseline: restrictions but able Goal status: INITIAL  5.  Decrease tenderness to 1+ at R GM and piriformis. Baseline: 2+ Goal status: INITIAL  6.  Improve PSFS score to 7 or 8. Baseline: 5 Goal status: INITIAL  PLAN:  PT FREQUENCY: 2x/week  PT DURATION: 8 weeks  PLANNED INTERVENTIONS: 97164- PT Re-evaluation, 97110-Therapeutic exercises, 97530- Therapeutic activity, 97112- Neuromuscular re-education, 97535- Self Care, [id number]- Manual therapy, [phone]- Aquatic Therapy, [id number]- Electrical stimulation (unattended), Patient/Family education, Balance training, Joint mobilization, Cryotherapy, and Moist heat.  PLAN FOR NEXT SESSION: Start on Nustep and progress with spinal stabilization program.   Susannah Daring, PT, DPT 02/19/24 12:07 PM

## 2024-02-20 ENCOUNTER — Other Ambulatory Visit (HOSPITAL_COMMUNITY): Payer: Self-pay

## 2024-02-22 ENCOUNTER — Encounter

## 2024-02-26 ENCOUNTER — Other Ambulatory Visit: Payer: Self-pay

## 2024-02-29 ENCOUNTER — Other Ambulatory Visit: Payer: Self-pay

## 2024-03-10 ENCOUNTER — Other Ambulatory Visit: Payer: Self-pay

## 2024-03-11 ENCOUNTER — Other Ambulatory Visit: Payer: Self-pay

## 2024-03-12 ENCOUNTER — Other Ambulatory Visit (HOSPITAL_COMMUNITY): Payer: Self-pay

## 2024-03-14 ENCOUNTER — Other Ambulatory Visit: Payer: Self-pay

## 2024-03-14 NOTE — Progress Notes (Signed)
 Specialty Pharmacy Refill Coordination Note  [name] is a 58 y.o. female contacted today regarding refills of specialty medication(s) Dasatinib  (SPRYCEL )   Patient requested [name] at [facility] at [place] date: 03/14/24   Medication will be filled on 03/14/24.

## 2024-04-06 ENCOUNTER — Other Ambulatory Visit (HOSPITAL_COMMUNITY): Payer: Self-pay

## 2024-04-08 ENCOUNTER — Encounter (INDEPENDENT_AMBULATORY_CARE_PROVIDER_SITE_OTHER): Payer: Self-pay

## 2024-04-08 ENCOUNTER — Encounter (HOSPITAL_COMMUNITY): Payer: Self-pay

## 2024-04-08 ENCOUNTER — Other Ambulatory Visit (HOSPITAL_COMMUNITY): Payer: Self-pay

## 2024-04-08 ENCOUNTER — Other Ambulatory Visit: Payer: Self-pay

## 2024-04-11 ENCOUNTER — Other Ambulatory Visit: Payer: Self-pay

## 2024-04-11 NOTE — Progress Notes (Signed)
 Specialty Pharmacy Refill Coordination Note  [name] is a 58 y.o. female contacted today regarding refills of specialty medication(s) Dasatinib  (SPRYCEL )   Patient requested [name] at [facility] at [place] date: 04/12/24   Medication will be filled on 04/12/24.

## 2024-04-17 ENCOUNTER — Other Ambulatory Visit: Payer: Self-pay | Admitting: Internal Medicine

## 2024-04-18 ENCOUNTER — Other Ambulatory Visit (HOSPITAL_COMMUNITY): Payer: Self-pay

## 2024-04-18 MED ORDER — COLESTIPOL HCL 1 G PO TABS
4.0000 g | ORAL_TABLET | Freq: Every day | ORAL | 2 refills | Status: DC
  Filled 2024-04-18: qty 120, 30d supply, fill #0
  Filled 2024-05-16: qty 120, 30d supply, fill #1
  Filled 2024-06-20: qty 120, 30d supply, fill #2

## 2024-04-21 ENCOUNTER — Other Ambulatory Visit (HOSPITAL_COMMUNITY): Payer: Self-pay

## 2024-05-05 ENCOUNTER — Encounter (INDEPENDENT_AMBULATORY_CARE_PROVIDER_SITE_OTHER): Payer: Self-pay

## 2024-05-06 ENCOUNTER — Other Ambulatory Visit: Payer: Self-pay | Admitting: Hematology and Oncology

## 2024-05-06 ENCOUNTER — Other Ambulatory Visit: Payer: Self-pay

## 2024-05-06 MED ORDER — DASATINIB 140 MG PO TABS
140.0000 mg | ORAL_TABLET | Freq: Every day | ORAL | 3 refills | Status: DC
  Filled 2024-05-06 – 2024-05-11 (×2): qty 30, 30d supply, fill #0
  Filled 2024-06-09: qty 30, 30d supply, fill #1
  Filled 2024-07-05: qty 30, 30d supply, fill #2
  Filled 2024-07-29 – 2024-08-03 (×3): qty 30, 30d supply, fill #3

## 2024-05-09 ENCOUNTER — Inpatient Hospital Stay: Attending: Hematology and Oncology

## 2024-05-09 DIAGNOSIS — F17210 Nicotine dependence, cigarettes, uncomplicated: Secondary | ICD-10-CM | POA: Insufficient documentation

## 2024-05-09 DIAGNOSIS — M545 Low back pain, unspecified: Secondary | ICD-10-CM

## 2024-05-09 DIAGNOSIS — C9210 Chronic myeloid leukemia, BCR/ABL-positive, not having achieved remission: Secondary | ICD-10-CM | POA: Diagnosis present

## 2024-05-09 LAB — COMPREHENSIVE METABOLIC PANEL WITH GFR
ALT: 16 U/L (ref 0–44)
AST: 22 U/L (ref 15–41)
Albumin: 4.2 g/dL (ref 3.5–5.0)
Alkaline Phosphatase: 96 U/L (ref 38–126)
Anion gap: 6 (ref 5–15)
BUN: 22 mg/dL — ABNORMAL HIGH (ref 6–20)
CO2: 26 mmol/L (ref 22–32)
Calcium: 9.2 mg/dL (ref 8.9–10.3)
Chloride: 108 mmol/L (ref 98–111)
Creatinine, Ser: 0.97 mg/dL (ref 0.44–1.00)
GFR, Estimated: >60 mL/min (ref >60)
Glucose, Bld: 124 mg/dL — ABNORMAL HIGH (ref 70–99)
Potassium: 4 mmol/L (ref 3.5–5.1)
Sodium: 140 mmol/L (ref 135–145)
Total Bilirubin: 0.3 mg/dL (ref 0.0–1.2)
Total Protein: 6.4 g/dL — ABNORMAL LOW (ref 6.5–8.1)

## 2024-05-09 LAB — CBC WITH DIFFERENTIAL/PLATELET
Abs Immature Granulocytes: 0.01 K/uL (ref 0.00–0.07)
Basophils Absolute: 0.1 K/uL (ref 0.0–0.1)
Basophils Relative: 2 %
Eosinophils Absolute: 0.7 K/uL — ABNORMAL HIGH (ref 0.0–0.5)
Eosinophils Relative: 10 %
HCT: 39 % (ref 36.0–46.0)
Hemoglobin: 13.8 g/dL (ref 12.0–15.0)
Immature Granulocytes: 0 %
Lymphocytes Relative: 38 %
Lymphs Abs: 2.6 K/uL (ref 0.7–4.0)
MCH: 33.6 pg (ref 26.0–34.0)
MCHC: 35.4 g/dL (ref 30.0–36.0)
MCV: 94.9 fL (ref 80.0–100.0)
Monocytes Absolute: 0.3 K/uL (ref 0.1–1.0)
Monocytes Relative: 5 %
Neutro Abs: 3.2 K/uL (ref 1.7–7.7)
Neutrophils Relative %: 45 %
Platelets: 283 K/uL (ref 150–400)
RBC: 4.11 MIL/uL (ref 3.87–5.11)
RDW: 12 % (ref 11.5–15.5)
WBC: 6.9 K/uL (ref 4.0–10.5)
nRBC: 0 % (ref 0.0–0.2)

## 2024-05-11 ENCOUNTER — Encounter (INDEPENDENT_AMBULATORY_CARE_PROVIDER_SITE_OTHER): Payer: Self-pay

## 2024-05-11 ENCOUNTER — Other Ambulatory Visit: Payer: Self-pay

## 2024-05-11 ENCOUNTER — Other Ambulatory Visit (HOSPITAL_COMMUNITY): Payer: Self-pay

## 2024-05-11 NOTE — Progress Notes (Signed)
 Specialty Pharmacy Refill Coordination Note  [name] is a 58 y.o. female contacted today regarding refills of specialty medication(s) Dasatinib  (SPRYCEL )   Patient requested (Patient-Rptd) Pickup at [facility] date: (Patient-Rptd) 05/12/24   Medication will be filled on 05/12/24.

## 2024-05-12 ENCOUNTER — Other Ambulatory Visit: Payer: Self-pay

## 2024-05-12 ENCOUNTER — Other Ambulatory Visit (HOSPITAL_COMMUNITY): Payer: Self-pay

## 2024-05-13 LAB — BCR-ABL1, CML/ALL, PCR, QUANT
E1A2 Transcript: <0.0032 %
b2a2 transcript: 0.0315 %
b3a2 transcript: <0.0032 %

## 2024-05-19 ENCOUNTER — Inpatient Hospital Stay (HOSPITAL_BASED_OUTPATIENT_CLINIC_OR_DEPARTMENT_OTHER): Admitting: Hematology and Oncology

## 2024-05-19 ENCOUNTER — Encounter: Payer: Self-pay | Admitting: Hematology and Oncology

## 2024-05-19 VITALS — BP 121/70 | HR 79 | Temp 99.4°F | Resp 18 | Ht 64.0 in | Wt 152.8 lb

## 2024-05-19 DIAGNOSIS — F17210 Nicotine dependence, cigarettes, uncomplicated: Secondary | ICD-10-CM | POA: Diagnosis not present

## 2024-05-19 DIAGNOSIS — C9210 Chronic myeloid leukemia, BCR/ABL-positive, not having achieved remission: Secondary | ICD-10-CM

## 2024-05-19 NOTE — Assessment & Plan Note (Addendum)
 We discussed importance of smoking cessation today

## 2024-05-19 NOTE — Progress Notes (Signed)
 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    Almarie Bedford, MD  INTERVAL HISTORY: she returns for treatment follow-up Complications related to previous cycle of chemotherapy included none  PHYSICAL EXAMINATION: ECOG PERFORMANCE STATUS: 0 - Asymptomatic  No results found for: CAN125    Latest Ref Rng & Units 05/09/2024   12:25 PM 02/05/2024   12:41 PM 11/02/2023    2:49 PM  CBC  WBC 4.0 - 10.5 K/uL 6.9  7.7  7.7   Hemoglobin 12.0 - 15.0 g/dL 86.1  86.6  87.5   Hematocrit 36.0 - 46.0 % 39.0  37.6  35.1   Platelets 150 - 400 K/uL 283  278  307       Chemistry      Component Value Date/Time   NA 140 05/09/2024 1225   K 4.0 05/09/2024 1225   CL 108 05/09/2024 1225   CO2 26 05/09/2024 1225   BUN 22 (H) 05/09/2024 1225   CREATININE 0.97 05/09/2024 1225   CREATININE 0.95 07/18/2022 1206      Component Value Date/Time   CALCIUM 9.2 05/09/2024 1225   ALKPHOS 96 05/09/2024 1225   AST 22 05/09/2024 1225   AST 31 07/18/2022 1206   ALT 16 05/09/2024 1225   ALT 33 07/18/2022 1206   BILITOT 0.3 05/09/2024 1225   BILITOT 0.4 07/18/2022 1206       Vitals:   05/19/24 1155  BP: 121/70  Pulse: 79  Resp: 18  Temp: 99.4 F  (37.4 C)  SpO2: 99%   Filed Weights   05/19/24 1155  Weight: 152 lb 12.8 oz (69.3 kg)   Other relevant data reviewed during this visit included CBC, CMP, BCR/ABL

## 2024-05-19 NOTE — Assessment & Plan Note (Addendum)
 She was diagnosed with CML in 2022, initially treated with imatinib  but have inadequate response Subsequently her treatment is switched to Sprycel  since January 2023 She achieved major molecular response in July 2024 Between July 2024 to July 2025, she has intermittent elevated BCR/ABL  We reviewed test results from October 2025  Recent test results suggested major molecular response She will continue Dasatinib  indefinitely I will see her again in 3 months

## 2024-05-23 ENCOUNTER — Encounter: Payer: Self-pay | Admitting: Radiology

## 2024-06-09 ENCOUNTER — Telehealth: Payer: Self-pay

## 2024-06-09 ENCOUNTER — Other Ambulatory Visit (HOSPITAL_COMMUNITY): Payer: Self-pay

## 2024-06-09 ENCOUNTER — Ambulatory Visit

## 2024-06-09 NOTE — Telephone Encounter (Signed)
 Renewal prior shara was submitted in Latent today, 06/09/24.  Once approved, patient will need to get treatment moved to [facility].

## 2024-06-10 ENCOUNTER — Other Ambulatory Visit (HOSPITAL_COMMUNITY): Payer: Self-pay

## 2024-06-13 ENCOUNTER — Other Ambulatory Visit: Payer: Self-pay

## 2024-06-14 ENCOUNTER — Other Ambulatory Visit: Payer: Self-pay

## 2024-06-14 ENCOUNTER — Other Ambulatory Visit (HOSPITAL_COMMUNITY): Payer: Self-pay

## 2024-06-14 NOTE — Progress Notes (Signed)
 Specialty Pharmacy Refill Coordination Note  [name] is a 59 y.o. female contacted today regarding refills of specialty medication(s) Dasatinib  (SPRYCEL )   Patient requested [name] at [facility] at [place] date: 06/15/24   Medication will be filled on: 06/14/24

## 2024-06-14 NOTE — Telephone Encounter (Addendum)
 Auth Submission: APPROVED Site of care: Site of care: [facility] Payer: BCBS commercial Medication & CPT/J Code(s) submitted: [place] Diagnosis Code: M81.0 Route of submission (phone, fax, portal): Latent Phone # Fax # Auth type: Buy/Bill PB Units/visits requested: 60mg  x 2 doses Reference number: [id number] Approval from: 07/21/24 to 06/09/25  See prior authorization approval letter for more info on biosimilar approval. Patient will need to be scheduled for injection after 07/21/24. I tried to call patient to explain the biosimilar and change of SOC situation but her VM is full.

## 2024-06-23 ENCOUNTER — Other Ambulatory Visit (HOSPITAL_COMMUNITY): Payer: Self-pay

## 2024-07-04 ENCOUNTER — Encounter (HOSPITAL_COMMUNITY): Payer: Self-pay | Admitting: Pulmonary Disease

## 2024-07-05 ENCOUNTER — Encounter (HOSPITAL_COMMUNITY): Payer: Self-pay

## 2024-07-05 ENCOUNTER — Other Ambulatory Visit (HOSPITAL_COMMUNITY): Payer: Self-pay

## 2024-07-06 ENCOUNTER — Other Ambulatory Visit (HOSPITAL_COMMUNITY): Payer: Self-pay | Admitting: Internal Medicine

## 2024-07-06 ENCOUNTER — Other Ambulatory Visit: Payer: Self-pay

## 2024-07-06 NOTE — Progress Notes (Signed)
 Specialty Pharmacy Refill Coordination Note  [name] is a 58 y.o. female contacted today regarding refills of specialty medication(s) Dasatinib  (SPRYCEL )   Patient requested (Patient-Rptd) Pickup at [facility] at [facility] date: (Patient-Rptd) 07/07/24   Medication will be filled on: 07/07/24

## 2024-07-07 ENCOUNTER — Other Ambulatory Visit: Payer: Self-pay

## 2024-07-08 ENCOUNTER — Other Ambulatory Visit (HOSPITAL_COMMUNITY): Payer: Self-pay | Admitting: Pharmacy Technician

## 2024-07-19 ENCOUNTER — Telehealth: Payer: Self-pay | Admitting: Internal Medicine

## 2024-07-19 ENCOUNTER — Other Ambulatory Visit: Payer: Self-pay | Admitting: Internal Medicine

## 2024-07-19 ENCOUNTER — Other Ambulatory Visit (HOSPITAL_COMMUNITY): Payer: Self-pay

## 2024-07-19 ENCOUNTER — Other Ambulatory Visit: Payer: Self-pay

## 2024-07-19 MED ORDER — COLESTIPOL HCL 1 G PO TABS
4.0000 g | ORAL_TABLET | Freq: Every day | ORAL | 2 refills | Status: DC
  Filled 2024-07-19: qty 120, 30d supply, fill #0

## 2024-07-19 NOTE — Telephone Encounter (Signed)
 Inbound call from patient stating that she needed to make her follow up appointment with Dr. Federico. Patient was scheduled for 2/4 at 10:30. She requested that I send a message to the nurse because she has been experiencing abdominal discomfort for about a week. She states that her stools will start solid and then goes into mild diarrhea. Patient is requesting a call back from the nurse to discuss symptoms and to see if she needs to be seen sooner with Dr. Federico. Please advise.

## 2024-07-22 NOTE — Telephone Encounter (Signed)
 Returned call to patient & she has been experiencing mild abdominal discomfort, nausea, and change in bowel movements for about a week and a half. BM's are daily, vary from diarrhea/formed. She's currently taking colestid  as prescribed & imodium 2 tablets daily. She is scheduled for f/u with Dr. Federico in February, but requesting to be seen sooner. OV scheduled for 1/5 at 8:30 am with [facility], NP.

## 2024-07-25 ENCOUNTER — Other Ambulatory Visit (INDEPENDENT_AMBULATORY_CARE_PROVIDER_SITE_OTHER)

## 2024-07-25 ENCOUNTER — Encounter: Payer: Self-pay | Admitting: Gastroenterology

## 2024-07-25 ENCOUNTER — Encounter: Payer: Self-pay | Admitting: Internal Medicine

## 2024-07-25 ENCOUNTER — Ambulatory Visit: Admitting: Gastroenterology

## 2024-07-25 ENCOUNTER — Other Ambulatory Visit (HOSPITAL_COMMUNITY): Payer: Self-pay

## 2024-07-25 ENCOUNTER — Telehealth: Payer: Self-pay | Admitting: Gastroenterology

## 2024-07-25 ENCOUNTER — Other Ambulatory Visit: Payer: Self-pay

## 2024-07-25 VITALS — BP 110/60 | HR 76 | Ht 64.0 in | Wt 151.0 lb

## 2024-07-25 DIAGNOSIS — R1030 Lower abdominal pain, unspecified: Secondary | ICD-10-CM

## 2024-07-25 DIAGNOSIS — K219 Gastro-esophageal reflux disease without esophagitis: Secondary | ICD-10-CM | POA: Diagnosis not present

## 2024-07-25 DIAGNOSIS — K5790 Diverticulosis of intestine, part unspecified, without perforation or abscess without bleeding: Secondary | ICD-10-CM | POA: Diagnosis not present

## 2024-07-25 DIAGNOSIS — R197 Diarrhea, unspecified: Secondary | ICD-10-CM

## 2024-07-25 DIAGNOSIS — Z1589 Genetic susceptibility to other disease: Secondary | ICD-10-CM | POA: Diagnosis not present

## 2024-07-25 DIAGNOSIS — R079 Chest pain, unspecified: Secondary | ICD-10-CM

## 2024-07-25 DIAGNOSIS — R110 Nausea: Secondary | ICD-10-CM

## 2024-07-25 DIAGNOSIS — R194 Change in bowel habit: Secondary | ICD-10-CM

## 2024-07-25 LAB — COMPREHENSIVE METABOLIC PANEL WITH GFR
ALT: 17 U/L (ref 3–35)
AST: 23 U/L (ref 5–37)
Albumin: 4.4 g/dL (ref 3.5–5.2)
Alkaline Phosphatase: 105 U/L (ref 39–117)
BUN: 22 mg/dL (ref 6–23)
CO2: 28 meq/L (ref 19–32)
Calcium: 9.7 mg/dL (ref 8.4–10.5)
Chloride: 106 meq/L (ref 96–112)
Creatinine, Ser: 1.07 mg/dL (ref 0.40–1.20)
GFR: 58.11 mL/min — ABNORMAL LOW
Glucose, Bld: 99 mg/dL (ref 70–99)
Potassium: 4.2 meq/L (ref 3.5–5.1)
Sodium: 141 meq/L (ref 135–145)
Total Bilirubin: 0.3 mg/dL (ref 0.2–1.2)
Total Protein: 6.4 g/dL (ref 6.0–8.3)

## 2024-07-25 LAB — CBC WITH DIFFERENTIAL/PLATELET
Basophils Absolute: 0.1 K/uL (ref 0.0–0.1)
Basophils Relative: 1.1 % (ref 0.0–3.0)
Eosinophils Absolute: 0.7 K/uL (ref 0.0–0.7)
Eosinophils Relative: 6.7 % — ABNORMAL HIGH (ref 0.0–5.0)
HCT: 39.4 % (ref 36.0–46.0)
Hemoglobin: 13.5 g/dL (ref 12.0–15.0)
Lymphocytes Relative: 20.5 % (ref 12.0–46.0)
Lymphs Abs: 2.1 K/uL (ref 0.7–4.0)
MCHC: 34.4 g/dL (ref 30.0–36.0)
MCV: 98.3 fl (ref 78.0–100.0)
Monocytes Absolute: 0.5 K/uL (ref 0.1–1.0)
Monocytes Relative: 5.2 % (ref 3.0–12.0)
Neutro Abs: 6.7 K/uL (ref 1.4–7.7)
Neutrophils Relative %: 66.5 % (ref 43.0–77.0)
Platelets: 289 K/uL (ref 150.0–400.0)
RBC: 4.01 Mil/uL (ref 3.87–5.11)
RDW: 12.3 % (ref 11.5–15.5)
WBC: 10 K/uL (ref 4.0–10.5)

## 2024-07-25 LAB — LIPASE: Lipase: 71 U/L — ABNORMAL HIGH (ref 11.0–59.0)

## 2024-07-25 MED ORDER — OMEPRAZOLE 20 MG PO CPDR
20.0000 mg | DELAYED_RELEASE_CAPSULE | Freq: Every day | ORAL | 0 refills | Status: DC
  Filled 2024-07-25: qty 90, 90d supply, fill #0

## 2024-07-25 MED ORDER — COLESTIPOL HCL 1 G PO TABS
4.0000 g | ORAL_TABLET | Freq: Every day | ORAL | 2 refills | Status: AC
  Filled 2024-08-23: qty 120, 30d supply, fill #0

## 2024-07-25 NOTE — Progress Notes (Signed)
 "  Chief Complaint:diarrhea Primary GI Doctor:Dr. 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 This led 3 subsequent intestinal surgeries including an ileocecectomy. After her surgery, she has had issues with diarrhea and has to follow a low residue diet for the most part. Sometimes she eats too much fiber, which causes more issues with diarrhea. Maternal grandmother had colon cancer. Patient works at [employer].   Interval History Patient last seen in GI office on 01/07/24 by Dr. Federico for follow-up diarrhea.  Patient laying on exam table when I walk in. She expresses several concerns today.  Patient reports sensitive stomach for past 10 days. No exposure or travel.  She reports as of recent she has had regular BM followed by loose stool.  Patient reports lower abdominal cramping. She has intermittent nausea without vomiting.  She takes imodium 2 capsules in am, takes colestipol  1 hour later.   She reports last night she had heartburn so she took OTC Rolaids. She called EMS and they came to evaluate her. Took her vital signs. EKG showed NSR.  The symptoms resolved after taking two Rolaids. Reports they told her Marlissa Emerick also be due to stress.  She tripped over box at work yesterday and fell. Reports bruising and swelling to right shin. She notified her PCP's office on call doctor.    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   Osteoporosis     Past Surgical History:  Procedure Laterality Date   ABDOMINAL HYSTERECTOMY  2009   APPENDECTOMY Bilateral 2005   bowel removal  2005   5 inches   BOWEL RESECTION  2005   2 feet   BREAST BIOPSY Right 04/29/2019   APOCRINE METAPLASIA    BREAST BIOPSY  02/03/2023   MM RT RADIOACTIVE SEED LOC MAMMO GUIDE 02/03/2023 [facility]   BREAST LUMPECTOMY WITH RADIOACTIVE SEED LOCALIZATION Left 10/03/2021   Procedure: LEFT BREAST LUMPECTOMY WITH RADIOACTIVE SEED LOCALIZATION;  Surgeon: Curvin Deward MOULD, MD;  Location: [facility];  Service: General;  Laterality: Left;   BREAST LUMPECTOMY WITH RADIOACTIVE SEED LOCALIZATION Right 02/04/2023   Procedure: RIGHT BREAST LUMPECTOMY WITH RADIOACTIVE SEED LOCALIZATION;  Surgeon: Aron Shoulders, MD;  Location: [facility] OR;  Service: General;  Laterality: Right;   COLONOSCOPY     FRACTURE SURGERY     Collar Bone Left Plate placed   MASTECTOMY Bilateral 08/12/2023   TONSILLECTOMY     As a child    Current Outpatient Medications  Medication Sig Dispense Refill   acetaminophen  (TYLENOL ) 325 MG tablet Take 325-650 mg by mouth daily  as needed (pain).     calcium carbonate (TUMS - DOSED IN MG ELEMENTAL CALCIUM) 500 MG chewable tablet Chew 1 tablet by mouth 2 (two) times daily as needed for heartburn or indigestion.     Calcium Carbonate-Vit D-Min (CALCIUM 1200 PO) Take 1 tablet by mouth in the morning and at bedtime.     cholecalciferol (VITAMIN D3) 25 MCG (1000 UNIT) tablet Take 1,000 Units by mouth in the morning.     clonazePAM (KLONOPIN) 0.5 MG tablet Take 0.5 mg by mouth 2 (two) times daily as needed for anxiety.     dasatinib  (SPRYCEL ) 140 MG tablet Take 1 tablet (140 mg total) by mouth daily. 30 tablet 3   diazepam  (VALIUM ) 5 MG  tablet Take 1 tablet by mouth 1 hour before procedure with very light food. Tamryn Popko bring 2nd tablet to appointment. 2 tablet 0   escitalopram (LEXAPRO) 20 MG tablet Take 30 mg by mouth daily.     Eyelid Cleansers (AVENOVA EX) Apply 1 spray topically 2 (two) times daily as needed (eye debris.).     famotidine (PEPCID) 20 MG tablet Take 20 mg by mouth 2 (two) times daily.     fexofenadine (ALLEGRA) 180 MG tablet Take 180 mg by mouth daily as needed for allergies or rhinitis.     ibuprofen (ADVIL) 200 MG tablet Take 400-600 mg by mouth every 8 (eight) hours as needed (pain.).     ketoconazole  (NIZORAL ) 2 % cream Apply Externally twice a day 28 days (Patient taking differently: Apply 1 Application topically See admin instructions. One application up to twice daily for skin irritation (underarms).) 30 g 3   Lactobacillus (PROBIOTIC ACIDOPHILUS PO) Take 1 capsule by mouth in the morning.     loperamide (IMODIUM) 2 MG capsule Take 2 mg by mouth in the morning and at bedtime. (Patient taking differently: Take 2 mg by mouth in the morning and at bedtime.)     LORazepam  (ATIVAN ) 1 MG tablet Take 1 tablet (1 mg total) by mouth 2 (two) times daily as needed for anxiety. 10 tablet 0   meloxicam  (MOBIC ) 7.5 MG tablet Take 1 tablet (7.5 mg total) by mouth 2 (two) times daily as needed for pain. 30 tablet 2   metaxalone  (SKELAXIN ) 400 MG tablet Take 1 tablet (400 mg total) by mouth 3 (three) times daily as needed. (Patient taking differently: Take 200-400 mg by mouth 3 (three) times daily as needed.) 30 tablet 0   Multiple Vitamin (MULTIVITAMIN WITH MINERALS) TABS tablet Take 1 tablet by mouth in the morning.     ondansetron  (ZOFRAN ) 4 MG tablet Take 1 tablet (4 mg total) by mouth every 8 (eight) hours as needed for nausea. 10 tablet 0   colestipol  (COLESTID ) 1 g tablet Take 4 tablets (4 g total) by mouth daily. 120 tablet 2   No current facility-administered medications for this visit.    Allergies as of 07/25/2024  - Review Complete 07/25/2024  Allergen Reaction Noted   Other Other (See Comments) 10/27/2019   Piperacillin  05/07/2023   Sulfa antibiotics Swelling 06/08/2013   Tazobactam  05/07/2023   Wellbutrin [bupropion] Other (See Comments) 10/21/2019   Zosyn [piperacillin-tazobactam in dex] Rash 01/26/2023    Family History  Problem Relation Age of Onset   Breast cancer Mother        bilateral; dx 4, 32   Acute myelogenous leukemia Mother        dx late 42s   Pancreatic cancer Father 20  Colon cancer Maternal Grandmother 81   Uterine cancer Maternal Aunt    Breast cancer Maternal Aunt        dx after 50   Bladder Cancer Paternal Uncle        dx after 6   Esophageal cancer Neg Hx    Rectal cancer Neg Hx    Stomach cancer Neg Hx     Review of Systems:    Constitutional: No weight loss, fever, chills, weakness or fatigue HEENT: Eyes: No change in vision               Ears, Nose, Throat:  No change in hearing or congestion Skin: No rash or itching Cardiovascular: No chest pain, chest pressure or palpitations   Respiratory: No SOB or cough Gastrointestinal: See HPI and otherwise negative Genitourinary: No dysuria or change in urinary frequency Neurological: No headache, dizziness or syncope Musculoskeletal: No new muscle or joint pain Hematologic: No bleeding or bruising Psychiatric: No history of depression or anxiety    Physical Exam:  Vital signs: BP 110/60   Pulse 76   Ht 5' 4 (1.626 m)   Wt 151 lb (68.5 kg)   LMP 11/18/2012   SpO2 97%   BMI 25.92 kg/m   Constitutional:   Pleasant  female appears to be in NAD, Well developed, Well nourished, alert and cooperative Eyes:   PEERL, EOMI. No icterus. Conjunctiva pink. Neck:  Supple Throat: Oral cavity and pharynx without inflammation, swelling or lesion.  Respiratory: Respirations even and unlabored. Lungs clear to auscultation bilaterally.   No wheezes, crackles, or rhonchi.  Cardiovascular: Normal S1, S2. Regular  rate and rhythm. No peripheral edema, cyanosis or pallor.  Gastrointestinal:  Soft, nondistended, LLQ abdominal tenderness with palpation. No rebound or guarding. Normal bowel sounds. No appreciable masses or hepatomegaly. Rectal:  Not performed.  Msk:  Symmetrical without gross deformities. Without edema, no deformity or joint abnormality.  Neurologic:  Alert and  oriented x4;  grossly normal neurologically.  Skin:   Dry and intact without significant lesions or rashes.  RELEVANT LABS AND IMAGING: CBC    Latest Ref Rng & Units 05/09/2024   12:25 PM 02/05/2024   12:41 PM 11/02/2023    2:49 PM  CBC  WBC 4.0 - 10.5 K/uL 6.9  7.7  7.7   Hemoglobin 12.0 - 15.0 g/dL 86.1  86.6  87.5   Hematocrit 36.0 - 46.0 % 39.0  37.6  35.1   Platelets 150 - 400 K/uL 283  278  307      CMP     Latest Ref Rng & Units 05/09/2024   12:25 PM 02/05/2024   12:41 PM 11/02/2023    2:49 PM  CMP  Glucose 70 - 99 mg/dL 875  860  864   BUN 6 - 20 mg/dL 22  17  20    Creatinine 0.44 - 1.00 mg/dL 9.02  9.05  9.13   Sodium 135 - 145 mmol/L 140  143  138   Potassium 3.5 - 5.1 mmol/L 4.0  4.0  3.7   Chloride 98 - 111 mmol/L 108  113  109   CO2 22 - 32 mmol/L 26  26  24    Calcium 8.9 - 10.3 mg/dL 9.2  8.7  8.9   Total Protein 6.5 - 8.1 g/dL 6.4  6.2  6.4   Total Bilirubin 0.0 - 1.2 mg/dL 0.3  0.2  0.3   Alkaline Phos 38 - 126 U/L 96  107  94  AST 15 - 41 U/L 22  22  26    ALT 0 - 44 U/L 16  17  25      Labs 10/2022: CMP unremarkable   Labs 01/2023: CBC nml. BMP with mildly elevated Cr of 1.02.    Ab U/S 12/19/15: IMPRESSION: 1. No acute hepatobiliary abnormality is observed. No gallstones are demonstrated. If there are clinical concerns of gallbladder dysfunction, a nuclear medicine hepatobiliary scan Paiten Boies be useful. 2. 7 mm nonobstructing mid to lower pole right sided kidney stone. 3. No acute intra-abdominal abnormality is observed.   Barium enema 06/24/18: IMPRESSION: 1. No persistent polypoid lesion or  constricting lesion is seen. There is some retained feces present particularly in the right colon making exclusion of small polyps difficult. 2. By history the terminal ileum has been resected, and the anastomosis of distal ileum with right colon is unremarkable with no stricture evident. 3. The colon is elongated and tortuous as noted above.   RUQ U/S 02/28/21: IMPRESSION: Unremarkable right upper quadrant ultrasound   Colonoscopy 02/25/23: - The examined portion of the ileum was normal.  - Patent side- to- side ileo- colonic anastomosis, characterized by healthy appearing mucosa.  - Four 3 to 10 mm polyps in the rectum, in the sigmoid colon and in the transverse colon, removed with a cold snare. Resected and retrieved. - Diverticulosis in the sigmoid colon and in the descending colon.  - Erythematous mucosa in the sigmoid colon. Biopsied.  - Non- bleeding internal hemorrhoids. Path: 1. Surgical [P], colon, transverse, sigmoid, rectal, polyp (4) - TUBULAR ADENOMA, FRAGMENTS. 2. Surgical [P], colon nos, random sites - COLONIC MUCOSA WITH NO SIGNIFICANT PATHOLOGY.  Assessment: Encounter Diagnoses  Name Primary?   Gastroesophageal reflux disease, unspecified whether esophagitis present Yes   Chest pain, unspecified type    Nausea without vomiting    Diarrhea, unspecified type    Lower abdominal pain    Diverticular disease    CHEK2 gene mutation positive    Altered bowel habits   Prior SBO s/p ileocecectomy    Patient presents with acute episode of altered bowel habits, lower abdominal pain, and nausea with concerns for possible diverticulitis.  Patient has been taking Imodium as well as colestipol  for hx of chronic diarrhea.  Colonoscopy August 2024 w/ negative biopsies.  Will go ahead and order lab work along with CT scan to evaluate and r/o acute cause diverticulitis, colitis, or appendicitis.     Patient also complains of worsening reflux and complaints he had episode last  night of chest discomfort evaluated by EMS.  Stable vital signs and EKG. reports symptoms improved after 2 doses of Rolaids.  Patient tells me she has not been taking the Pepcid.  We discussed trialing a PPI for a few weeks for possible gastritis. Concerns taking long term due to hx of osteoporosis.  Will check cardiac marker troponin 1 per her concerns for heart issues, but advised her if abnormal this would have to be addressed with PCP or cardiology referral. Also mentions some stress related to her fall yesterday. Recommended she follow-up with PCP if it does not improve. Agrees to plan.  Plan: - CBC, CMET, lipase , Troponin I  -CTAP ASAP -Start Omeprazole  20 mg po daily, short term trial  2-4 weeks  - Continue Imodium daily prn - Continue colestipol  4 g every day, refilled  -Next colonoscopy is due in 02/2026 for polyp surveillance   Thank you for the courtesy of this consult. Please call me with any  questions or concerns.   Rihanna Marseille, FNP-C [facility] 07/25/2024, 9:14 AM  Cc: Verdia Lombard, MD  "

## 2024-07-25 NOTE — Telephone Encounter (Signed)
 Hello, per this pt's health plan her CT scan needs to be rescheduled to [facility] at [address]. The plan doesn't allow for this to be done at [facility]

## 2024-07-25 NOTE — Patient Instructions (Signed)
 GERD -Start Omeprazole  20 mg po daily , take 1 capsule 3-45 mins before breakfast -Continue Pepcid 20 mg BID PRN  -Recommend GERD diet  If stomach acid needs managing, antacids are preferred but must be taken at least 2 hours before or after Sprycel    Diarrhea - Continue Imodium daily - Continue colestipol  4 g every day.  -Push fluids by mouth  -Advised to go to the ER if there is any severe abdominal pain, unable to hold down food/water, blood in stool or vomit, chest pain, shortness of breath, or any worsening symptoms.    Your provider has requested that you go to the basement level for lab work before leaving today. Press B on the elevator. The lab is located at the first door on the left as you exit the elevator.   You have been scheduled for a CT scan of the abdomen and pelvis at [facility], 1st floor Radiology. You are scheduled on 07/28/24 at 3:30pm. You should arrive 15 minutes prior to your appointment time for registration. You may take any medications as prescribed with a small amount of water, if necessary. If you take any of the following medications: METFORMIN, GLUCOPHAGE, GLUCOVANCE, AVANDAMET, RIOMET, FORTAMET, ACTOPLUS MET, JANUMET, GLUMETZA or METAGLIP, you MAY be asked to HOLD this medication 48 hours AFTER the exam.   The purpose of you drinking the oral contrast is to aid in the visualization of your intestinal tract. The contrast solution may cause some diarrhea. Depending on your individual set of symptoms, you may also receive an intravenous injection of x-ray contrast/dye. Plan on being at [facility] for 45 minutes or longer, depending on the type of exam you are having performed.   If you have any questions regarding your exam or if you need to reschedule, you may call [facility] at [phone] between the hours of 8:00 am and 5:00 pm, Monday-Friday.   _______________________________________________________  If your blood pressure at your  visit was 140/90 or greater, please contact your primary care physician to follow up on this.  _______________________________________________________  If you are age 57 or older, your body mass index should be between 23-30. Your Body mass index is 25.92 kg/m. If this is out of the aforementioned range listed, please consider follow up with your Primary Care Provider.  If you are age 57 or younger, your body mass index should be between 19-25. Your Body mass index is 25.92 kg/m. If this is out of the aformentioned range listed, please consider follow up with your Primary Care Provider.   ________________________________________________________  The [facility] providers would like to encourage you to use MYCHART to communicate with providers for non-urgent requests or questions.  Due to long hold times on the telephone, sending your provider a message by [facility] may be a faster and more efficient way to get a response.  Please allow 48 business hours for a response.  Please remember that this is for non-urgent requests.  _______________________________________________________  [facility] is using a team-based approach to care.  Your team is made up of your doctor and two to three APPS. Our APPS (Nurse Practitioners and Physician Assistants) work with your physician to ensure care continuity for you. They are fully qualified to address your health concerns and develop a treatment plan. They communicate directly with your gastroenterologist to care for you. Seeing the Advanced Practice Practitioners on your physician's team can help you by facilitating care more promptly, often allowing for earlier appointments, access to  diagnostic testing, procedures, and other specialty referrals.   Thank you for trusting me with your gastrointestinal care. Deanna May, FNP-C

## 2024-07-26 ENCOUNTER — Ambulatory Visit: Payer: Self-pay | Admitting: Gastroenterology

## 2024-07-26 DIAGNOSIS — R109 Unspecified abdominal pain: Secondary | ICD-10-CM

## 2024-07-26 DIAGNOSIS — K5289 Other specified noninfective gastroenteritis and colitis: Secondary | ICD-10-CM

## 2024-07-26 DIAGNOSIS — R197 Diarrhea, unspecified: Secondary | ICD-10-CM

## 2024-07-26 LAB — TROPONIN I: Troponin I: 36 ng/L

## 2024-07-26 NOTE — Progress Notes (Signed)
 I agree with the assessment and plan as outlined by Ms. May.

## 2024-07-27 ENCOUNTER — Other Ambulatory Visit (HOSPITAL_COMMUNITY): Payer: Self-pay

## 2024-07-27 ENCOUNTER — Encounter (INDEPENDENT_AMBULATORY_CARE_PROVIDER_SITE_OTHER): Payer: Self-pay | Admitting: Otolaryngology

## 2024-07-27 ENCOUNTER — Ambulatory Visit (INDEPENDENT_AMBULATORY_CARE_PROVIDER_SITE_OTHER): Admitting: Otolaryngology

## 2024-07-27 VITALS — BP 112/73 | HR 78 | Ht 64.0 in | Wt 151.0 lb

## 2024-07-27 DIAGNOSIS — T161XXA Foreign body in right ear, initial encounter: Secondary | ICD-10-CM | POA: Diagnosis not present

## 2024-07-27 DIAGNOSIS — J3481 Nasal mucositis (ulcerative): Secondary | ICD-10-CM | POA: Diagnosis not present

## 2024-07-27 DIAGNOSIS — H9201 Otalgia, right ear: Secondary | ICD-10-CM | POA: Insufficient documentation

## 2024-07-27 DIAGNOSIS — M26601 Right temporomandibular joint disorder, unspecified: Secondary | ICD-10-CM | POA: Diagnosis not present

## 2024-07-27 MED ORDER — METHOCARBAMOL 500 MG PO TABS
500.0000 mg | ORAL_TABLET | Freq: Every evening | ORAL | 3 refills | Status: AC | PRN
  Filled 2024-07-27: qty 30, 30d supply, fill #0

## 2024-07-27 MED ORDER — MUPIROCIN 2 % EX OINT
1.0000 | TOPICAL_OINTMENT | Freq: Three times a day (TID) | CUTANEOUS | 2 refills | Status: AC
  Filled 2024-07-27: qty 22, 8d supply, fill #0

## 2024-07-27 NOTE — Progress Notes (Signed)
 CC: Right ear pain  Discussed the use of AI scribe software for clinical note transcription with the patient, who gave verbal consent to proceed.  History of Present Illness [name] is a 59 year old female who presents today complaining of chronic right ear pain.  She reports persistent right ear discomfort for approximately ten years, characterized by intermittent otalgia and a sensation of canal narrowing compared to the left. Symptoms are exacerbated by emotional distress. She denies recent otitis media but has a history of recurrent childhood ear infections and a severe episode of otitis externa in 2000-2001, complicated by an allergic reaction to sulfa ear drops and requiring wick placement. She has not undergone otologic surgery and does not recall tympanostomy tube placement.   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 She experiences jaw tenderness with movement and has sustained a dental fracture due to clenching. She owns a mouth guard but uses it inconsistently due to poor sleep quality. She has not undergone TMJ or jaw surgery and has not had recent dental procedures.   She describes several years of chronic rawness, pain, and intermittent epistaxis of the left nasal vestibule, particularly during winter months. She has attempted symptomatic relief with topical emollients, with limited benefit.   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   Osteoporosis     Past Surgical History:  Procedure Laterality Date   ABDOMINAL  HYSTERECTOMY  2009   APPENDECTOMY Bilateral 2005   bowel removal  2005   5 inches   BOWEL RESECTION  2005   2 feet   BREAST BIOPSY Right 04/29/2019   APOCRINE METAPLASIA    BREAST BIOPSY  02/03/2023   MM RT RADIOACTIVE SEED LOC MAMMO GUIDE 02/03/2023 [facility]   BREAST LUMPECTOMY WITH RADIOACTIVE SEED LOCALIZATION Left 10/03/2021   Procedure: LEFT BREAST LUMPECTOMY WITH RADIOACTIVE SEED LOCALIZATION;  Surgeon: Curvin Deward MOULD, MD;  Location: [facility];  Service: General;  Laterality: Left;   BREAST LUMPECTOMY WITH RADIOACTIVE SEED LOCALIZATION Right 02/04/2023   Procedure: RIGHT BREAST LUMPECTOMY WITH RADIOACTIVE SEED LOCALIZATION;  Surgeon: Aron Shoulders, MD;  Location: [facility] OR;  Service: General;  Laterality: Right;   COLONOSCOPY     FRACTURE SURGERY     Collar Bone Left Plate placed   MASTECTOMY Bilateral 08/12/2023   TONSILLECTOMY     As a child    Family History  Problem Relation Age of Onset   Breast cancer Mother        bilateral; dx 2, 77   Acute myelogenous leukemia Mother        dx late 80s   Pancreatic cancer Father 49   Colon cancer Maternal Grandmother 81   Uterine cancer Maternal Aunt    Breast cancer Maternal Aunt        dx after 50   Bladder Cancer Paternal Uncle        dx after 50   Esophageal cancer Neg Hx  Rectal cancer Neg Hx    Stomach cancer Neg Hx     Social History:  reports that she has been smoking cigarettes. She has a 17 pack-year smoking history. She has never used smokeless tobacco. She reports that she does not currently use alcohol. She reports that she does not use drugs.  Allergies: Allergies[1]  Prior to Admission medications  Medication Sig Start Date End Date Taking? Authorizing Provider  acetaminophen  (TYLENOL ) 325 MG tablet Take 325-650 mg by mouth daily as needed (pain).   Yes [provider]  calcium carbonate (TUMS - DOSED IN MG ELEMENTAL CALCIUM) 500 MG chewable tablet Chew 1 tablet by mouth 2  (two) times daily as needed for heartburn or indigestion.   Yes [provider]  Calcium Carbonate-Vit D-Min (CALCIUM 1200 PO) Take 1 tablet by mouth in the morning and at bedtime.   Yes [provider]  cholecalciferol (VITAMIN D3) 25 MCG (1000 UNIT) tablet Take 1,000 Units by mouth in the morning.   Yes [provider]  clonazePAM (KLONOPIN) 0.5 MG tablet Take 0.5 mg by mouth 2 (two) times daily as needed for anxiety.   Yes [provider]  colestipol  (COLESTID ) 1 g tablet Take 4 tablets (4 g total) by mouth daily. 07/25/24  Yes May, Deanna J, NP  dasatinib  (SPRYCEL ) 140 MG tablet Take 1 tablet (140 mg total) by mouth daily. 05/06/24  Yes Gorsuch, Ni, MD  diazepam  (VALIUM ) 5 MG tablet Take 1 tablet by mouth 1 hour before procedure with very light food. May bring 2nd tablet to appointment. 12/18/22  Yes Williams, Megan E, NP  escitalopram (LEXAPRO) 20 MG tablet Take 30 mg by mouth daily. 10/18/19  Yes [provider]  Eyelid Cleansers (AVENOVA EX) Apply 1 spray topically 2 (two) times daily as needed (eye debris.).   Yes [provider]  famotidine (PEPCID) 20 MG tablet Take 20 mg by mouth 2 (two) times daily.   Yes [provider]  fexofenadine (ALLEGRA) 180 MG tablet Take 180 mg by mouth daily as needed for allergies or rhinitis.   Yes [provider]  ibuprofen (ADVIL) 200 MG tablet Take 400-600 mg by mouth every 8 (eight) hours as needed (pain.).   Yes [provider]  ketoconazole  (NIZORAL ) 2 % cream Apply Externally twice a day 28 days Patient taking differently: Apply 1 Application topically See admin instructions. One application up to twice daily for skin irritation (underarms). 03/31/22  Yes   Lactobacillus (PROBIOTIC ACIDOPHILUS PO) Take 1 capsule by mouth in the morning.   Yes [provider]  loperamide (IMODIUM) 2 MG capsule Take 2 mg by mouth in the morning and at bedtime. Patient taking differently:  Take 2 mg by mouth in the morning and at bedtime.   Yes [provider]  LORazepam  (ATIVAN ) 1 MG tablet Take 1 tablet (1 mg total) by mouth 2 (two) times daily as needed for anxiety. 08/09/21  Yes Gorsuch, Ni, MD  meloxicam  (MOBIC ) 7.5 MG tablet Take 1 tablet (7.5 mg total) by mouth 2 (two) times daily as needed for pain. 05/29/23  Yes Jerri Kay HERO, MD  metaxalone  (SKELAXIN ) 400 MG tablet Take 1 tablet (400 mg total) by mouth 3 (three) times daily as needed. Patient taking differently: Take 200-400 mg by mouth 3 (three) times daily as needed. 11/26/22  Yes Williams, Megan E, NP  methocarbamol  (ROBAXIN ) 500 MG tablet Take 1 tablet (500 mg total) by mouth at bedtime as needed (ear pain). 07/27/24  Yes Karis Clunes, MD  Multiple Vitamin (MULTIVITAMIN WITH MINERALS) TABS tablet Take 1 tablet by mouth in the morning.   Yes [provider]  mupirocin  ointment (BACTROBAN ) 2 % Apply 1 Application topically 3 (three) times daily for 7 days. 07/27/24 08/03/24 Yes Karis Clunes, MD  omeprazole  (PRILOSEC) 20 MG capsule Take 1 capsule (20 mg total) by mouth daily. 07/25/24  Yes May, Deanna J, NP  ondansetron  (ZOFRAN ) 4 MG tablet Take 1 tablet (4 mg total) by mouth every 8 (eight) hours as needed for nausea. 08/12/23  Yes     Blood pressure 112/73, pulse 78, height 5' 4 (1.626 m), weight 151 lb (68.5 kg), last menstrual period 11/18/2012, SpO2 94%. Exam: General: Communicates without difficulty, well nourished, no acute distress. Head: Normocephalic, no evidence injury, no tenderness, facial buttresses intact without stepoff. Face/sinus: No tenderness to palpation and percussion. Facial movement is normal and symmetric. Eyes: PERRL, EOMI. No scleral icterus, conjunctivae clear. Neuro: CN II exam reveals vision grossly intact.  No nystagmus at any point of gaze. Ears: Auricles well formed without lesions.  Ear canals are intact without mass or lesion.  Right ear canal foreign body is noted.  The TMs are intact without  fluid.  The right TMJ is mildly tender to touch.  Nose: External evaluation reveals normal support and skin without lesions.  Dorsum is intact.  Anterior rhinoscopy reveals congested mucosa over anterior aspect of inferior turbinates and intact septum.  No purulence noted. Oral:  Oral cavity and oropharynx are intact, symmetric, without erythema or edema.  Mucosa is moist without lesions. Neck: Full range of motion without pain.  There is no significant lymphadenopathy.  No masses palpable.  Thyroid bed within normal limits to palpation.  Parotid glands and submandibular glands equal bilaterally without mass.  Trachea is midline. Neuro:  CN 2-12 grossly intact.   Procedure: Right ear foreign body removal.   Anesthesia: None Description of the procedure: The patient is placed on a supine position.  Under the operating microscope, the right ear canal is examined.  A plastic tab foreign body is noted to be impacted on the medial aspect of the ear canal.  Under the microscope, the foreign body is carefully removed with an alligator forceps and a 90 hook.  After foreign body removal, the tympanic membrane and middle ear space are noted to be normal.  The patient tolerated the procedure well.  Assessment & Plan Foreign body in right ear A sticky tab from dental floss was adherent to her right tympanic membrane, likely present for an extended period and causing discomfort and a popping sensation. Examination revealed no infection or structural abnormality aside from the foreign body.  - Advised her to monitor for recurrent symptoms and to contact the clinic if additional issues develop.  Right temporomandibular joint disorder Chronic right TMJ disorder, likely secondary to prior trauma and ongoing bruxism. She experiences jaw tenderness with movement and reports inconsistent mouth guard use due to discomfort. No history of TMJ or jaw surgery.  - Prescribed methocarbamol  (Robaxin ) at night for muscle relaxation  and analgesia as needed. - Emphasized nocturnal mouth guard use to mitigate bruxism and TMJ symptoms.  Impetigo of left nostril Chronic rawness, pain, and intermittent epistaxis of the left nostril, particularly in winter. Vaseline provided minimal relief. Topical antibiotic therapy is expected to improve symptoms. - Prescribed mupirocin  (Bactroban ) ointment to be applied to the affected area three times daily for one week.   Kourtnei Rauber W Soul Deveney 07/27/2024, 1:18  PM      [1]  Allergies Allergen Reactions   Other Other (See Comments)    Can't process certain food like lettuce, sesame seed.   Piperacillin    Sulfa Antibiotics Swelling   Tazobactam    Wellbutrin [Bupropion] Other (See Comments)    hyper   Zosyn [Piperacillin-Tazobactam In Dex] Rash

## 2024-07-28 ENCOUNTER — Ambulatory Visit (HOSPITAL_COMMUNITY)

## 2024-07-29 ENCOUNTER — Other Ambulatory Visit: Payer: Self-pay

## 2024-07-29 ENCOUNTER — Encounter (HOSPITAL_COMMUNITY): Payer: Self-pay | Admitting: Internal Medicine

## 2024-07-29 ENCOUNTER — Ambulatory Visit
Admission: RE | Admit: 2024-07-29 | Discharge: 2024-07-29 | Disposition: A | Source: Ambulatory Visit | Attending: Gastroenterology

## 2024-07-29 ENCOUNTER — Encounter: Payer: Self-pay | Admitting: Internal Medicine

## 2024-07-29 ENCOUNTER — Telehealth (HOSPITAL_COMMUNITY): Payer: Self-pay | Admitting: Pharmacy Technician

## 2024-07-29 DIAGNOSIS — K219 Gastro-esophageal reflux disease without esophagitis: Secondary | ICD-10-CM

## 2024-07-29 DIAGNOSIS — R110 Nausea: Secondary | ICD-10-CM

## 2024-07-29 DIAGNOSIS — K5790 Diverticulosis of intestine, part unspecified, without perforation or abscess without bleeding: Secondary | ICD-10-CM

## 2024-07-29 DIAGNOSIS — R197 Diarrhea, unspecified: Secondary | ICD-10-CM

## 2024-07-29 DIAGNOSIS — Z1589 Genetic susceptibility to other disease: Secondary | ICD-10-CM

## 2024-07-29 DIAGNOSIS — R1030 Lower abdominal pain, unspecified: Secondary | ICD-10-CM

## 2024-07-29 MED ORDER — IOPAMIDOL (ISOVUE-300) INJECTION 61%
100.0000 mL | Freq: Once | INTRAVENOUS | Status: AC | PRN
  Administered 2024-07-29: 100 mL via INTRAVENOUS

## 2024-07-29 NOTE — Telephone Encounter (Signed)
 Auth Submission: APPROVED Site of care: [facility] Payer: [employer] Medication & CPT/J Code(s) submitted: Jubbonti (denosumab -bbdz) [id number] Diagnosis Code: M81.0 Route of submission (phone, fax, portal): fax Phone # Fax # Auth type: Buy/Bill HB Units/visits requested: 60mg  x 2 doses, q 6 months Reference number: [id number] Approval from: 07/21/24 to 06/09/25  Per [employer] rep, shara is good for Stoboclo or Jubbonti.     Tyresa Prindiville, CPhT [facility] Phone: [phone] 07/29/2024

## 2024-07-29 NOTE — Progress Notes (Signed)
 Calcium on 07/25/24 wnl  Last Prolia  completed on 12/03/23  Sherry Pennant, PharmD, MPH, BCPS, CPP Clinical Pharmacist

## 2024-08-01 ENCOUNTER — Encounter (HOSPITAL_COMMUNITY): Payer: Self-pay

## 2024-08-01 NOTE — Telephone Encounter (Signed)
" °  °  MEDICAL PROCESSING: PAYOR ID: [id number] GROUP: [id number] ID: [id number] "

## 2024-08-02 ENCOUNTER — Other Ambulatory Visit: Payer: Self-pay | Admitting: Pharmacy Technician

## 2024-08-02 ENCOUNTER — Other Ambulatory Visit: Payer: Self-pay

## 2024-08-03 ENCOUNTER — Encounter (HOSPITAL_COMMUNITY): Payer: Self-pay | Admitting: Internal Medicine

## 2024-08-03 ENCOUNTER — Other Ambulatory Visit: Payer: Self-pay

## 2024-08-03 ENCOUNTER — Other Ambulatory Visit: Payer: Self-pay | Admitting: Pharmacist

## 2024-08-03 ENCOUNTER — Other Ambulatory Visit (HOSPITAL_COMMUNITY): Payer: Self-pay

## 2024-08-03 MED ORDER — DASATINIB 140 MG PO TABS: 140.0000 mg | ORAL_TABLET | Freq: Every day | ORAL | 3 refills | Status: AC

## 2024-08-04 ENCOUNTER — Telehealth: Payer: Self-pay

## 2024-08-04 ENCOUNTER — Other Ambulatory Visit (HOSPITAL_COMMUNITY): Payer: Self-pay

## 2024-08-04 NOTE — Progress Notes (Signed)
 Patient filling through [facility] [facility] for generic copay  assistance.

## 2024-08-04 NOTE — Telephone Encounter (Signed)
 Oral Oncology Patient Advocate Encounter  Patient is now filling Dasatinib  through [facility] for generic copay assistance.   Charlott Hamilton,  CPhT-Adv  she/her/hers [facility]  [facility] Pharmacy Technician Patient Advocate [facility] Phone: [phone]  Fax: [phone] [email or web address]

## 2024-08-05 ENCOUNTER — Other Ambulatory Visit: Payer: Self-pay

## 2024-08-05 ENCOUNTER — Encounter: Admitting: Physician Assistant

## 2024-08-05 NOTE — Addendum Note (Signed)
 Addended by: KOLEEN PERKINS F on: 08/05/2024 11:49 AM   Modules accepted: Orders

## 2024-08-05 NOTE — Telephone Encounter (Signed)
 Attempted to reach patient . Left voicemail asking patient to log in to her active MyChart to get instructions about taking a Diatherix stool test. Requested return call or MyChart message.

## 2024-08-10 ENCOUNTER — Inpatient Hospital Stay: Attending: Hematology and Oncology

## 2024-08-10 DIAGNOSIS — C9210 Chronic myeloid leukemia, BCR/ABL-positive, not having achieved remission: Secondary | ICD-10-CM | POA: Insufficient documentation

## 2024-08-10 DIAGNOSIS — K219 Gastro-esophageal reflux disease without esophagitis: Secondary | ICD-10-CM | POA: Insufficient documentation

## 2024-08-10 DIAGNOSIS — G8929 Other chronic pain: Secondary | ICD-10-CM

## 2024-08-10 LAB — CBC WITH DIFFERENTIAL/PLATELET
Abs Immature Granulocytes: 0.01 K/uL (ref 0.00–0.07)
Basophils Absolute: 0.1 K/uL (ref 0.0–0.1)
Basophils Relative: 1 %
Eosinophils Absolute: 0.7 K/uL — ABNORMAL HIGH (ref 0.0–0.5)
Eosinophils Relative: 8 %
HCT: 38.1 % (ref 36.0–46.0)
Hemoglobin: 13.4 g/dL (ref 12.0–15.0)
Immature Granulocytes: 0 %
Lymphocytes Relative: 29 %
Lymphs Abs: 2.5 K/uL (ref 0.7–4.0)
MCH: 33.4 pg (ref 26.0–34.0)
MCHC: 35.2 g/dL (ref 30.0–36.0)
MCV: 95 fL (ref 80.0–100.0)
Monocytes Absolute: 0.5 K/uL (ref 0.1–1.0)
Monocytes Relative: 6 %
Neutro Abs: 4.7 K/uL (ref 1.7–7.7)
Neutrophils Relative %: 56 %
Platelets: 334 K/uL (ref 150–400)
RBC: 4.01 MIL/uL (ref 3.87–5.11)
RDW: 12.4 % (ref 11.5–15.5)
WBC: 8.5 K/uL (ref 4.0–10.5)
nRBC: 0 % (ref 0.0–0.2)

## 2024-08-10 LAB — COMPREHENSIVE METABOLIC PANEL WITH GFR
ALT: 19 U/L (ref 0–44)
AST: 27 U/L (ref 15–41)
Albumin: 4.4 g/dL (ref 3.5–5.0)
Alkaline Phosphatase: 88 U/L (ref 38–126)
Anion gap: 12 (ref 5–15)
BUN: 21 mg/dL — ABNORMAL HIGH (ref 6–20)
CO2: 25 mmol/L (ref 22–32)
Calcium: 9.3 mg/dL (ref 8.9–10.3)
Chloride: 105 mmol/L (ref 98–111)
Creatinine, Ser: 0.97 mg/dL (ref 0.44–1.00)
GFR, Estimated: >60 mL/min
Glucose, Bld: 113 mg/dL — ABNORMAL HIGH (ref 70–99)
Potassium: 4 mmol/L (ref 3.5–5.1)
Sodium: 141 mmol/L (ref 135–145)
Total Bilirubin: 0.4 mg/dL (ref 0.0–1.2)
Total Protein: 6.7 g/dL (ref 6.5–8.1)

## 2024-08-17 LAB — BCR-ABL1, CML/ALL, PCR, QUANT
E1A2 Transcript: <0.0032 %
b2a2 transcript: 0.0518 %
b3a2 transcript: <0.0032 %

## 2024-08-19 ENCOUNTER — Inpatient Hospital Stay: Admitting: Hematology and Oncology

## 2024-08-19 ENCOUNTER — Telehealth: Payer: Self-pay

## 2024-08-19 VITALS — BP 117/70 | HR 89 | Temp 99.3°F | Resp 16 | Ht 64.0 in | Wt 151.4 lb

## 2024-08-19 DIAGNOSIS — C9210 Chronic myeloid leukemia, BCR/ABL-positive, not having achieved remission: Secondary | ICD-10-CM | POA: Diagnosis not present

## 2024-08-19 DIAGNOSIS — K219 Gastro-esophageal reflux disease without esophagitis: Secondary | ICD-10-CM | POA: Diagnosis not present

## 2024-08-19 NOTE — Assessment & Plan Note (Addendum)
 She has frequent symptoms of GERD We discussed lifestyle changes She suspect that the GERD is from significant stress

## 2024-08-19 NOTE — Assessment & Plan Note (Addendum)
 She was diagnosed with CML in 2022, initially treated with imatinib  but have inadequate response Subsequently her treatment is switched to Sprycel  since January 2023 She achieved major molecular response in July 2024 Between July 2024 to Jan 2026, she has intermittent elevated BCR/ABL  We reviewed test results from Jan 2026 Recent test results suggested major molecular response but slightly elevated BCR/ABL compared to previous visit We discussed importance of discontinuation of antiacid medicine She will continue Dasatinib  indefinitely I will see her again in 3 months

## 2024-08-19 NOTE — Telephone Encounter (Signed)
 Returned call to pharmacy about medication interaction with Sprycel  and omeprazole . Patient was told by pharmacy to hold omeprazole . Told pharmacy Dr. Lonn is okay with hold omeprazole .

## 2024-08-19 NOTE — Progress Notes (Signed)
 [facility] OFFICE PROGRESS NOTE  Patient Care Team: Verdia Lombard, MD as PCP - General (Internal Medicine)  Assessment & Plan CML (chronic myeloid leukemia) (HCC) She was diagnosed with CML in 2022, initially treated with imatinib  but have inadequate response Subsequently her treatment is switched to Sprycel  since January 2023 She achieved major molecular response in July 2024 Between July 2024 to Jan 2026, she has intermittent elevated BCR/ABL  We reviewed test results from Jan 2026 Recent test results suggested major molecular response but slightly elevated BCR/ABL compared to previous visit We discussed importance of discontinuation of antiacid medicine She will continue Dasatinib  indefinitely I will see her again in 3 months Gastroesophageal reflux disease without esophagitis She has frequent symptoms of GERD We discussed lifestyle changes She suspect that the GERD is from significant stress  No orders of the defined types were placed in this encounter.    Almarie Bedford, MD  INTERVAL HISTORY: she returns for surveillance follow-up for myeloproliferative disorder/neoplasm, CML on Sprycel  Patient denies recent bleeding such as epistaxis, hematuria or hematochezia No recent infection We reviewed medication list and discussed medication changes We discussed test results and future plan of care as outlined above She complained of major stress in her life causing exacerbation of reflux disease Due to interaction with Sprycel , recent omeprazole  was discontinued  PHYSICAL EXAMINATION: ECOG PERFORMANCE STATUS: 1 - Symptomatic but completely ambulatory  Vitals:   08/19/24 1231  BP: 117/70  Pulse: 89  Resp: 16  Temp: 99.3 F (37.4 C)  SpO2: 96%   Lab Results  Component Value Date   WBC 8.5 08/10/2024   HGB 13.4 08/10/2024   HCT 38.1 08/10/2024   MCV 95.0 08/10/2024   PLT 334 08/10/2024

## 2024-08-23 ENCOUNTER — Other Ambulatory Visit: Payer: Self-pay

## 2024-08-23 ENCOUNTER — Other Ambulatory Visit (HOSPITAL_COMMUNITY): Payer: Self-pay

## 2024-08-24 ENCOUNTER — Ambulatory Visit: Admitting: Internal Medicine

## 2024-08-24 ENCOUNTER — Encounter: Payer: Self-pay | Admitting: Internal Medicine

## 2024-08-24 VITALS — BP 110/64 | HR 76 | Ht 64.0 in | Wt 152.0 lb

## 2024-08-24 DIAGNOSIS — Z860100 Personal history of colon polyps, unspecified: Secondary | ICD-10-CM | POA: Diagnosis not present

## 2024-08-24 DIAGNOSIS — Z9889 Other specified postprocedural states: Secondary | ICD-10-CM | POA: Diagnosis not present

## 2024-08-24 DIAGNOSIS — Z8719 Personal history of other diseases of the digestive system: Secondary | ICD-10-CM | POA: Diagnosis not present

## 2024-08-24 DIAGNOSIS — K649 Unspecified hemorrhoids: Secondary | ICD-10-CM

## 2024-08-24 DIAGNOSIS — Z1501 Genetic susceptibility to malignant neoplasm of breast: Secondary | ICD-10-CM | POA: Diagnosis not present

## 2024-08-24 DIAGNOSIS — Z1589 Genetic susceptibility to other disease: Secondary | ICD-10-CM

## 2024-08-24 DIAGNOSIS — K219 Gastro-esophageal reflux disease without esophagitis: Secondary | ICD-10-CM | POA: Diagnosis not present

## 2024-08-24 DIAGNOSIS — R197 Diarrhea, unspecified: Secondary | ICD-10-CM

## 2024-08-24 NOTE — Progress Notes (Signed)
 "  Chief Complaint:diarrhea  HPI: 59 year old female with history of CHEK2 mutation, prior SBO s/p ileocecectomy, CML, and s/p lumpectomy presents for follow up of diarrhea   Back in 2005 she had terrible abdominal pain and was diagnosed with endometriosis that had infiltrated into his small and large intestine. This led 3 subsequent intestinal surgeries including an ileocecectomy. After her surgery, she has had issues with diarrhea and has to follow a low residue diet for the most part. Sometimes she eats too much fiber, which causes more issues with diarrhea. Maternal grandmother had colon cancer. Patient works at [employer].   Interval History: Her diarrhea has gotten better. She has been eating more food with preservatives recently, which may have set things off slightly. She has not been as active with the winter storm, which has led to some constipation. She is having one stool per day currently. She is still doing colestipol  daily and Imodium two doses daily, which are both working for her. Her reflux flares up on occasion after she eats too fast.   Wt Readings from Last 3 Encounters:  08/24/24 152 lb (68.9 kg)  08/19/24 151 lb 6.4 oz (68.7 kg)  07/27/24 151 lb (68.5 kg)    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   Osteoporosis     Past Surgical History:  Procedure Laterality Date   ABDOMINAL HYSTERECTOMY  2009   APPENDECTOMY Bilateral 2005   bowel removal  2005   5 inches   BOWEL RESECTION  2005   2 feet   BREAST BIOPSY Right 04/29/2019   APOCRINE METAPLASIA    BREAST BIOPSY  02/03/2023   MM RT  RADIOACTIVE SEED LOC MAMMO GUIDE 02/03/2023 [facility]   BREAST LUMPECTOMY WITH RADIOACTIVE SEED LOCALIZATION Left 10/03/2021   Procedure: LEFT BREAST LUMPECTOMY WITH RADIOACTIVE SEED LOCALIZATION;  Surgeon: Curvin Deward MOULD, MD;  Location: [facility];  Service: General;  Laterality: Left;   BREAST LUMPECTOMY WITH RADIOACTIVE SEED LOCALIZATION Right 02/04/2023   Procedure: RIGHT BREAST LUMPECTOMY WITH RADIOACTIVE SEED LOCALIZATION;  Surgeon: Aron Shoulders, MD;  Location: [facility] OR;  Service: General;  Laterality: Right;   COLONOSCOPY     FRACTURE SURGERY     Collar Bone Left Plate placed   MASTECTOMY Bilateral 08/12/2023   TONSILLECTOMY     As a child    Current Outpatient Medications  Medication Sig Dispense Refill   acetaminophen  (TYLENOL ) 325 MG tablet Take 325-650 mg by mouth daily as needed (pain).     calcium carbonate (TUMS - DOSED IN MG ELEMENTAL CALCIUM) 500 MG chewable tablet Chew 1 tablet by mouth 2 (two) times daily as needed for heartburn or indigestion.     Calcium Carbonate-Vit D-Min (CALCIUM 1200 PO) Take 1 tablet by mouth in the morning and at bedtime.     cholecalciferol (VITAMIN D3) 25 MCG (1000 UNIT) tablet Take 1,000 Units by mouth in the morning.     clonazePAM (KLONOPIN) 0.5 MG tablet Take 0.5 mg by  mouth 2 (two) times daily as needed for anxiety.     colestipol  (COLESTID ) 1 g tablet Take 4 tablets (4 g total) by mouth daily. 120 tablet 2   dasatinib  (SPRYCEL ) 140 MG tablet Take 1 tablet (140 mg total) by mouth daily. 30 tablet 3   diazepam  (VALIUM ) 5 MG tablet Take 1 tablet by mouth 1 hour before procedure with very light food. May bring 2nd tablet to appointment. 2 tablet 0   escitalopram (LEXAPRO) 20 MG tablet Take 30 mg by mouth daily.     Eyelid Cleansers (AVENOVA EX) Apply 1 spray topically 2 (two) times daily as needed (eye debris.).     fexofenadine (ALLEGRA) 180 MG tablet Take 180 mg by mouth daily as needed for allergies or rhinitis.      ibuprofen (ADVIL) 200 MG tablet Take 400-600 mg by mouth every 8 (eight) hours as needed (pain.).     ketoconazole  (NIZORAL ) 2 % cream Apply Externally twice a day 28 days 30 g 3   Lactobacillus (PROBIOTIC ACIDOPHILUS PO) Take 1 capsule by mouth in the morning.     loperamide (IMODIUM) 2 MG capsule Take 2 mg by mouth in the morning and at bedtime. (Patient taking differently: Take 2 mg by mouth once.)     LORazepam  (ATIVAN ) 1 MG tablet Take 1 tablet (1 mg total) by mouth 2 (two) times daily as needed for anxiety. 10 tablet 0   meloxicam  (MOBIC ) 7.5 MG tablet Take 1 tablet (7.5 mg total) by mouth 2 (two) times daily as needed for pain. 30 tablet 2   metaxalone  (SKELAXIN ) 400 MG tablet Take 1 tablet (400 mg total) by mouth 3 (three) times daily as needed. 30 tablet 0   methocarbamol  (ROBAXIN ) 500 MG tablet Take 1 tablet (500 mg total) by mouth at bedtime as needed (ear pain). 30 tablet 3   Multiple Vitamin (MULTIVITAMIN WITH MINERALS) TABS tablet Take 1 tablet by mouth in the morning.     ondansetron  (ZOFRAN ) 4 MG tablet Take 1 tablet (4 mg total) by mouth every 8 (eight) hours as needed for nausea. 10 tablet 0   No current facility-administered medications for this visit.    Allergies as of 08/24/2024 - Review Complete 08/24/2024  Allergen Reaction Noted   Other Other (See Comments) 10/27/2019   Piperacillin  05/07/2023   Sulfa antibiotics Swelling 06/08/2013   Tazobactam  05/07/2023   Wellbutrin [bupropion] Other (See Comments) 10/21/2019   Zosyn [piperacillin-tazobactam in dex] Rash 01/26/2023    Family History  Problem Relation Age of Onset   Breast cancer Mother        bilateral; dx 24, 34   Acute myelogenous leukemia Mother        dx late 37s   Pancreatic cancer Father 67   Colon cancer Maternal Grandmother 81   Uterine cancer Maternal Aunt    Breast cancer Maternal Aunt        dx after 50   Bladder Cancer Paternal Uncle        dx after 50   Esophageal cancer Neg Hx     Rectal cancer Neg Hx    Stomach cancer Neg Hx      Physical Exam:  Vital signs: BP 110/64   Pulse 76   Ht 5' 4 (1.626 m)   Wt 152 lb (68.9 kg)   LMP 11/18/2012   SpO2 99%   BMI 26.09 kg/m   Constitutional:   Pleasant  female appears to be in NAD, Well developed,  Well nourished, alert and cooperative Eyes:   PEERL, EOMI. No icterus. Conjunctiva pink. Respiratory: Respirations even and unlabored  Cardiovascular: Regular rate Gastrointestinal:  Soft, nondistended, mildly tender in RLQ ab pain Msk:  Symmetrical without gross deformities. Without edema, no deformity or joint abnormality.  Neurologic:  Alert and  oriented x4;  grossly normal neurologically.  Skin:   Dry and intact without significant lesions or rashes.  RELEVANT LABS AND IMAGING: CBC    Latest Ref Rng & Units 08/10/2024    1:12 PM 07/25/2024    9:39 AM 05/09/2024   12:25 PM  CBC  WBC 4.0 - 10.5 K/uL 8.5  10.0  6.9   Hemoglobin 12.0 - 15.0 g/dL 86.5  86.4  86.1   Hematocrit 36.0 - 46.0 % 38.1  39.4  39.0   Platelets 150 - 400 K/uL 334  289.0  283      CMP     Latest Ref Rng & Units 08/10/2024    1:12 PM 07/25/2024    9:39 AM 05/09/2024   12:25 PM  CMP  Glucose 70 - 99 mg/dL 886  99  875   BUN 6 - 20 mg/dL 21  22  22    Creatinine 0.44 - 1.00 mg/dL 9.02  8.92  9.02   Sodium 135 - 145 mmol/L 141  141  140   Potassium 3.5 - 5.1 mmol/L 4.0  4.2  4.0   Chloride 98 - 111 mmol/L 105  106  108   CO2 22 - 32 mmol/L 25  28  26    Calcium 8.9 - 10.3 mg/dL 9.3  9.7  9.2   Total Protein 6.5 - 8.1 g/dL 6.7  6.4  6.4   Total Bilirubin 0.0 - 1.2 mg/dL 0.4  0.3  0.3   Alkaline Phos 38 - 126 U/L 88  105  96   AST 15 - 41 U/L 27  23  22    ALT 0 - 44 U/L 19  17  16      Labs 10/2022: CMP unremarkable   Labs 01/2023: CBC nml. BMP with mildly elevated Cr of 1.02.   Labs 07/2024: CBC and CMP unremarkable. Lipase mildly elevated at 71.   Ab U/S 12/19/15: IMPRESSION: 1. No acute hepatobiliary abnormality is observed. No  gallstones are demonstrated. If there are clinical concerns of gallbladder dysfunction, a nuclear medicine hepatobiliary scan may be useful. 2. 7 mm nonobstructing mid to lower pole right sided kidney stone. 3. No acute intra-abdominal abnormality is observed.   Barium enema 06/24/18: IMPRESSION: 1. No persistent polypoid lesion or constricting lesion is seen. There is some retained feces present particularly in the right colon making exclusion of small polyps difficult. 2. By history the terminal ileum has been resected, and the anastomosis of distal ileum with right colon is unremarkable with no stricture evident. 3. The colon is elongated and tortuous as noted above.   RUQ U/S 02/28/21: IMPRESSION: Unremarkable right upper quadrant ultrasound  CT A/P w/contrast 07/29/24: IMPRESSION: Mild wall thickening of rectum and distal sigmoid colon, which could be due to underdistention versus mild proctocolitis. No other significant abnormality identified.   Colonoscopy 02/25/23: - The examined portion of the ileum was normal.  - Patent side- to- side ileo- colonic anastomosis, characterized by healthy appearing mucosa.  - Four 3 to 10 mm polyps in the rectum, in the sigmoid colon and in the transverse colon, removed with a cold snare. Resected and retrieved. - Diverticulosis in the sigmoid colon and in the  descending colon.  - Erythematous mucosa in the sigmoid colon. Biopsied.  - Non- bleeding internal hemorrhoids. Path: 1. Surgical [P], colon, transverse, sigmoid, rectal, polyp (4) - TUBULAR ADENOMA, FRAGMENTS. 2. Surgical [P], colon nos, random sites - COLONIC MUCOSA WITH NO SIGNIFICANT PATHOLOGY.  Assessment: Diarrhea CHEK2 mutation Prior SBO s/p ileocecectomy Hemorrhoids GERD History of colon polyps  Patient's diarrhea has resolved. Recent CT scan showed some mild wall thickening of the rectum and distal sigmoid colon, which could be due to under distension versus mild  proctocolitis. Suspect that she may have had an infection that led to her symptoms. Unfortunately her last [facility] stool test that she submitted on 08/13/23 was not processed appropriately. We will continue to monitor for now. Patient will let me know if she has recurrent symptoms  Plan: - GERD handout - Can use Mylanta or Tums PRN, avoid using within 2 hours of Sprycel  - Continue Imodium daily prn - Continue colestipol  4 g every day, refilled  - Next colonoscopy is due in 02/2026 for polyp surveillance  - If recurrent diarrhea, will plan for another [facility] stool test. - RTC 6 months  Estefana Kidney, MD [facility] 08/24/2024, 11:20 AM  I spent 32 minutes of time, including in depth chart review, independent review of results as outlined above, communicating results with the patient directly, face-to-face time with the patient, coordinating care, and ordering studies and medications as appropriate, and documentation.  "

## 2024-08-24 NOTE — Patient Instructions (Addendum)
 Please follow up in 6 months.   Thank you for entrusting me with your care and for choosing [facility], Dr. Estefana Kidney   _______________________________________________________  If your blood pressure at your visit was 140/90 or greater, please contact your primary care physician to follow up on this.  _______________________________________________________  If you are age 57 or older, your body mass index should be between 23-30. Your Body mass index is 26.09 kg/m. If this is out of the aforementioned range listed, please consider follow up with your Primary Care Provider.  If you are age 52 or younger, your body mass index should be between 19-25. Your Body mass index is 26.09 kg/m. If this is out of the aformentioned range listed, please consider follow up with your Primary Care Provider.   ________________________________________________________  The  GI providers would like to encourage you to use MYCHART to communicate with providers for non-urgent requests or questions.  Due to long hold times on the telephone, sending your provider a message by [facility] may be a faster and more efficient way to get a response.  Please allow 48 business hours for a response.  Please remember that this is for non-urgent requests.  _______________________________________________________  [facility] is using a team-based approach to care.  Your team is made up of your doctor and two to three APPS. Our APPS (Nurse Practitioners and Physician Assistants) work with your physician to ensure care continuity for you. They are fully qualified to address your health concerns and develop a treatment plan. They communicate directly with your gastroenterologist to care for you. Seeing the Advanced Practice Practitioners on your physician's team can help you by facilitating care more promptly, often allowing for earlier appointments, access to diagnostic testing, procedures, and other  specialty referrals.    Due to recent changes in healthcare laws, you may see the results of your imaging and laboratory studies on MyChart before your provider has had a chance to review them.  We understand that in some cases there may be results that are confusing or concerning to you. Not all laboratory results come back in the same time frame and the provider may be waiting for multiple results in order to interpret others.  Please give us  48 hours in order for your provider to thoroughly review all the results before contacting the office for clarification of your results.

## 2024-08-26 ENCOUNTER — Telehealth: Payer: Self-pay | Admitting: Internal Medicine

## 2024-08-26 NOTE — Telephone Encounter (Signed)
 Incoming call from lab regarding recent stool sample. Lab stated the sample was collected on 01/23 and received on 02/02. Lab stated the sample is no longer vaild.  Call back number: [phone] [name] Please advise. Thank you.

## 2024-08-26 NOTE — Addendum Note (Signed)
 Addended by: VICCI FUJITA on: 08/26/2024 08:50 AM   Modules accepted: Orders

## 2024-08-29 ENCOUNTER — Inpatient Hospital Stay (HOSPITAL_COMMUNITY): Admission: RE | Admit: 2024-08-29 | Source: Ambulatory Visit

## 2024-11-07 ENCOUNTER — Inpatient Hospital Stay

## 2024-11-17 ENCOUNTER — Inpatient Hospital Stay: Admitting: Hematology and Oncology

## 2025-02-27 ENCOUNTER — Encounter (HOSPITAL_COMMUNITY)
